# Patient Record
Sex: Female | Born: 1960
Health system: Southern US, Community
[De-identification: ages and names within clinical notes are randomized; demographics above are authoritative.]

## PROBLEM LIST (undated history)

## (undated) ENCOUNTER — Emergency Department (HOSPITAL_COMMUNITY): Admission: EM | Payer: Self-pay | Source: Home / Self Care

## (undated) DIAGNOSIS — R112 Nausea with vomiting, unspecified: Secondary | ICD-10-CM

## (undated) DIAGNOSIS — D649 Anemia, unspecified: Secondary | ICD-10-CM

## (undated) DIAGNOSIS — N186 End stage renal disease: Secondary | ICD-10-CM

## (undated) DIAGNOSIS — I10 Essential (primary) hypertension: Secondary | ICD-10-CM

## (undated) DIAGNOSIS — Z9289 Personal history of other medical treatment: Secondary | ICD-10-CM

## (undated) DIAGNOSIS — F32A Depression, unspecified: Secondary | ICD-10-CM

## (undated) DIAGNOSIS — H409 Unspecified glaucoma: Secondary | ICD-10-CM

## (undated) DIAGNOSIS — T8859XA Other complications of anesthesia, initial encounter: Secondary | ICD-10-CM

## (undated) DIAGNOSIS — H3500 Unspecified background retinopathy: Secondary | ICD-10-CM

## (undated) DIAGNOSIS — T4145XA Adverse effect of unspecified anesthetic, initial encounter: Secondary | ICD-10-CM

## (undated) DIAGNOSIS — E785 Hyperlipidemia, unspecified: Secondary | ICD-10-CM

## (undated) DIAGNOSIS — G629 Polyneuropathy, unspecified: Secondary | ICD-10-CM

## (undated) DIAGNOSIS — H269 Unspecified cataract: Secondary | ICD-10-CM

## (undated) DIAGNOSIS — R0600 Dyspnea, unspecified: Secondary | ICD-10-CM

## (undated) DIAGNOSIS — E1121 Type 2 diabetes mellitus with diabetic nephropathy: Secondary | ICD-10-CM

## (undated) DIAGNOSIS — Z973 Presence of spectacles and contact lenses: Secondary | ICD-10-CM

## (undated) DIAGNOSIS — F329 Major depressive disorder, single episode, unspecified: Secondary | ICD-10-CM

## (undated) HISTORY — PX: REFRACTIVE SURGERY: SHX103

## (undated) HISTORY — DX: Hyperlipidemia, unspecified: E78.5

## (undated) HISTORY — DX: Essential (primary) hypertension: I10

## (undated) HISTORY — DX: Major depressive disorder, single episode, unspecified: F32.9

## (undated) HISTORY — DX: Depression, unspecified: F32.A

## (undated) HISTORY — DX: Type 2 diabetes mellitus with diabetic nephropathy: E11.21

## (undated) HISTORY — PX: EYE SURGERY: SHX253

---
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
## 1984-01-07 DIAGNOSIS — E1139 Type 2 diabetes mellitus with other diabetic ophthalmic complication: Secondary | ICD-10-CM

## 1984-01-07 DIAGNOSIS — E1165 Type 2 diabetes mellitus with hyperglycemia: Secondary | ICD-10-CM

## 1992-01-07 HISTORY — PX: MYOMECTOMY: SHX85

## 1997-08-31 ENCOUNTER — Emergency Department (HOSPITAL_COMMUNITY): Admission: EM | Admit: 1997-08-31 | Discharge: 1997-08-31 | Payer: Self-pay | Admitting: Emergency Medicine

## 1997-11-22 ENCOUNTER — Ambulatory Visit (HOSPITAL_COMMUNITY): Admission: RE | Admit: 1997-11-22 | Discharge: 1997-11-22 | Payer: Self-pay | Admitting: Internal Medicine

## 1997-12-25 ENCOUNTER — Ambulatory Visit (HOSPITAL_COMMUNITY): Admission: RE | Admit: 1997-12-25 | Discharge: 1997-12-26 | Payer: Self-pay | Admitting: Ophthalmology

## 1998-01-29 ENCOUNTER — Encounter: Payer: Self-pay | Admitting: Ophthalmology

## 1998-01-29 ENCOUNTER — Ambulatory Visit (HOSPITAL_COMMUNITY): Admission: RE | Admit: 1998-01-29 | Discharge: 1998-01-29 | Payer: Self-pay | Admitting: Ophthalmology

## 1998-06-25 ENCOUNTER — Ambulatory Visit (HOSPITAL_COMMUNITY): Admission: RE | Admit: 1998-06-25 | Discharge: 1998-06-25 | Payer: Self-pay | Admitting: Ophthalmology

## 1998-07-17 ENCOUNTER — Emergency Department (HOSPITAL_COMMUNITY): Admission: EM | Admit: 1998-07-17 | Discharge: 1998-07-17 | Payer: Self-pay | Admitting: Emergency Medicine

## 1998-07-23 ENCOUNTER — Ambulatory Visit (HOSPITAL_COMMUNITY): Admission: RE | Admit: 1998-07-23 | Discharge: 1998-07-23 | Payer: Self-pay | Admitting: Ophthalmology

## 2002-01-06 HISTORY — PX: ABDOMINAL HYSTERECTOMY: SHX81

## 2002-06-30 ENCOUNTER — Encounter (INDEPENDENT_AMBULATORY_CARE_PROVIDER_SITE_OTHER): Payer: Self-pay | Admitting: Internal Medicine

## 2002-06-30 ENCOUNTER — Ambulatory Visit (HOSPITAL_COMMUNITY): Admission: RE | Admit: 2002-06-30 | Discharge: 2002-06-30 | Payer: Self-pay | Admitting: Obstetrics & Gynecology

## 2002-06-30 ENCOUNTER — Encounter: Payer: Self-pay | Admitting: Obstetrics & Gynecology

## 2002-08-12 ENCOUNTER — Encounter: Admission: RE | Admit: 2002-08-12 | Discharge: 2002-08-12 | Payer: Self-pay | Admitting: Internal Medicine

## 2002-08-18 ENCOUNTER — Inpatient Hospital Stay (HOSPITAL_COMMUNITY): Admission: AD | Admit: 2002-08-18 | Discharge: 2002-08-18 | Payer: Self-pay | Admitting: Obstetrics & Gynecology

## 2002-09-15 ENCOUNTER — Encounter: Admission: RE | Admit: 2002-09-15 | Discharge: 2002-09-15 | Payer: Self-pay | Admitting: Obstetrics and Gynecology

## 2002-11-08 ENCOUNTER — Encounter: Admission: RE | Admit: 2002-11-08 | Discharge: 2002-11-08 | Payer: Self-pay | Admitting: Obstetrics and Gynecology

## 2002-11-21 ENCOUNTER — Encounter: Admission: RE | Admit: 2002-11-21 | Discharge: 2002-11-21 | Payer: Self-pay | Admitting: Internal Medicine

## 2002-11-28 ENCOUNTER — Encounter: Admission: RE | Admit: 2002-11-28 | Discharge: 2002-11-28 | Payer: Self-pay | Admitting: Internal Medicine

## 2002-12-08 ENCOUNTER — Encounter: Admission: RE | Admit: 2002-12-08 | Discharge: 2002-12-08 | Payer: Self-pay | Admitting: Obstetrics and Gynecology

## 2002-12-19 ENCOUNTER — Encounter (INDEPENDENT_AMBULATORY_CARE_PROVIDER_SITE_OTHER): Payer: Self-pay | Admitting: *Deleted

## 2002-12-19 ENCOUNTER — Inpatient Hospital Stay (HOSPITAL_COMMUNITY): Admission: RE | Admit: 2002-12-19 | Discharge: 2002-12-22 | Payer: Self-pay | Admitting: Obstetrics and Gynecology

## 2002-12-26 ENCOUNTER — Inpatient Hospital Stay (HOSPITAL_COMMUNITY): Admission: AD | Admit: 2002-12-26 | Discharge: 2002-12-26 | Payer: Self-pay | Admitting: Obstetrics and Gynecology

## 2003-01-03 ENCOUNTER — Encounter: Admission: RE | Admit: 2003-01-03 | Discharge: 2003-01-03 | Payer: Self-pay | Admitting: Obstetrics and Gynecology

## 2003-01-11 ENCOUNTER — Encounter: Admission: RE | Admit: 2003-01-11 | Discharge: 2003-01-11 | Payer: Self-pay | Admitting: Internal Medicine

## 2003-01-18 ENCOUNTER — Encounter: Admission: RE | Admit: 2003-01-18 | Discharge: 2003-01-18 | Payer: Self-pay | Admitting: Internal Medicine

## 2003-02-02 ENCOUNTER — Encounter: Admission: RE | Admit: 2003-02-02 | Discharge: 2003-02-02 | Payer: Self-pay | Admitting: Obstetrics and Gynecology

## 2003-02-09 ENCOUNTER — Encounter: Admission: RE | Admit: 2003-02-09 | Discharge: 2003-02-09 | Payer: Self-pay | Admitting: Internal Medicine

## 2003-06-16 ENCOUNTER — Encounter: Admission: RE | Admit: 2003-06-16 | Discharge: 2003-06-16 | Payer: Self-pay | Admitting: Internal Medicine

## 2003-12-28 ENCOUNTER — Ambulatory Visit: Payer: Self-pay | Admitting: Internal Medicine

## 2004-01-11 ENCOUNTER — Ambulatory Visit: Payer: Self-pay | Admitting: Internal Medicine

## 2004-03-18 ENCOUNTER — Ambulatory Visit: Payer: Self-pay | Admitting: Internal Medicine

## 2004-03-27 ENCOUNTER — Ambulatory Visit: Payer: Self-pay | Admitting: Internal Medicine

## 2004-05-13 ENCOUNTER — Ambulatory Visit: Payer: Self-pay | Admitting: Internal Medicine

## 2005-05-20 ENCOUNTER — Ambulatory Visit: Payer: Self-pay | Admitting: Internal Medicine

## 2005-06-17 ENCOUNTER — Ambulatory Visit: Payer: Self-pay | Admitting: Internal Medicine

## 2005-06-20 ENCOUNTER — Ambulatory Visit: Payer: Self-pay | Admitting: Internal Medicine

## 2005-08-15 ENCOUNTER — Ambulatory Visit: Payer: Self-pay | Admitting: Gynecology

## 2005-12-23 DIAGNOSIS — I10 Essential (primary) hypertension: Secondary | ICD-10-CM

## 2005-12-23 DIAGNOSIS — E1169 Type 2 diabetes mellitus with other specified complication: Secondary | ICD-10-CM

## 2005-12-23 DIAGNOSIS — E1139 Type 2 diabetes mellitus with other diabetic ophthalmic complication: Secondary | ICD-10-CM

## 2005-12-23 DIAGNOSIS — F341 Dysthymic disorder: Secondary | ICD-10-CM

## 2005-12-23 DIAGNOSIS — E785 Hyperlipidemia, unspecified: Secondary | ICD-10-CM

## 2006-01-01 DIAGNOSIS — D509 Iron deficiency anemia, unspecified: Secondary | ICD-10-CM

## 2006-04-28 ENCOUNTER — Telehealth: Payer: Self-pay | Admitting: *Deleted

## 2006-04-29 ENCOUNTER — Encounter (INDEPENDENT_AMBULATORY_CARE_PROVIDER_SITE_OTHER): Payer: Self-pay | Admitting: Internal Medicine

## 2006-06-10 ENCOUNTER — Encounter (INDEPENDENT_AMBULATORY_CARE_PROVIDER_SITE_OTHER): Payer: Self-pay | Admitting: Internal Medicine

## 2006-06-10 ENCOUNTER — Ambulatory Visit: Payer: Self-pay | Admitting: Internal Medicine

## 2006-06-10 LAB — CONVERTED CEMR LAB
Bilirubin Urine: NEGATIVE
Bilirubin Urine: NEGATIVE
Creatinine, Urine: 127.6 mg/dL
Glucose, Urine, Semiquant: >=1000
Ketones, ur: NEGATIVE mg/dL
Ketones, urine, test strip: NEGATIVE
Microalb Creat Ratio: 1841.7 mg/g — ABNORMAL HIGH (ref 0.0–30.0)
Microalb, Ur: 235 mg/dL — ABNORMAL HIGH (ref 0.00–1.89)
Nitrite: POSITIVE
Nitrite: POSITIVE — AB
Protein, U semiquant: >=300
Protein, ur: >300 mg/dL — AB
Specific Gravity, Urine: >=1.03
Specific Gravity, Urine: 1.024 (ref 1.005–1.03)
Urine Glucose: 500 mg/dL — AB
Urobilinogen, UA: 0.2
Urobilinogen, UA: 0.2 (ref 0.0–1.0)
pH: 6
pH: 6.5 (ref 5.0–8.0)

## 2006-06-11 LAB — CONVERTED CEMR LAB
ALT: 25 units/L (ref 0–35)
AST: 16 units/L (ref 0–37)
Albumin: 3.9 g/dL (ref 3.5–5.2)
Alkaline Phosphatase: 95 units/L (ref 39–117)
BUN: 14 mg/dL (ref 6–23)
CO2: 24 meq/L (ref 19–32)
Calcium: 8.8 mg/dL (ref 8.4–10.5)
Chloride: 105 meq/L (ref 96–112)
Cholesterol: 254 mg/dL — ABNORMAL HIGH (ref 0–200)
Creatinine, Ser: 0.73 mg/dL (ref 0.40–1.20)
Glucose, Bld: 223 mg/dL — ABNORMAL HIGH (ref 70–99)
HCT: 42.7 % (ref 36.0–46.0)
HDL: 55 mg/dL (ref >39)
Hemoglobin: 14.6 g/dL (ref 12.0–15.0)
LDL Cholesterol: 186 mg/dL — ABNORMAL HIGH (ref 0–99)
MCHC: 34.2 g/dL (ref 30.0–36.0)
MCV: 81.6 fL (ref 78.0–100.0)
Platelets: 302 10*3/uL (ref 150–400)
Potassium: 4.3 meq/L (ref 3.5–5.3)
RBC: 5.23 M/uL — ABNORMAL HIGH (ref 3.87–5.11)
RDW: 13.4 % (ref 11.5–14.0)
Sodium: 139 meq/L (ref 135–145)
Total Bilirubin: 0.4 mg/dL (ref 0.3–1.2)
Total CHOL/HDL Ratio: 4.6
Total Protein: 6.2 g/dL (ref 6.0–8.3)
Triglycerides: 66 mg/dL (ref <150)
VLDL: 13 mg/dL (ref 0–40)
WBC: 4.4 10*3/uL (ref 4.0–10.5)

## 2006-06-19 ENCOUNTER — Telehealth: Payer: Self-pay | Admitting: *Deleted

## 2006-06-24 ENCOUNTER — Ambulatory Visit: Payer: Self-pay | Admitting: Internal Medicine

## 2006-06-24 LAB — CONVERTED CEMR LAB
Blood Glucose, Fingerstick: 172
Blood Glucose, Home Monitor: 2 mg/dL

## 2006-07-15 ENCOUNTER — Telehealth: Payer: Self-pay | Admitting: *Deleted

## 2006-08-12 ENCOUNTER — Telehealth (INDEPENDENT_AMBULATORY_CARE_PROVIDER_SITE_OTHER): Payer: Self-pay | Admitting: Pharmacy Technician

## 2006-08-20 ENCOUNTER — Ambulatory Visit: Payer: Self-pay | Admitting: Internal Medicine

## 2006-09-21 ENCOUNTER — Telehealth: Payer: Self-pay | Admitting: *Deleted

## 2006-09-23 ENCOUNTER — Encounter (INDEPENDENT_AMBULATORY_CARE_PROVIDER_SITE_OTHER): Payer: Self-pay | Admitting: Internal Medicine

## 2006-09-23 ENCOUNTER — Ambulatory Visit: Payer: Self-pay | Admitting: Internal Medicine

## 2006-09-23 DIAGNOSIS — E1129 Type 2 diabetes mellitus with other diabetic kidney complication: Secondary | ICD-10-CM

## 2006-09-23 LAB — CONVERTED CEMR LAB
BUN: 14 mg/dL (ref 6–23)
CO2: 24 meq/L (ref 19–32)
Calcium: 9.5 mg/dL (ref 8.4–10.5)
Chloride: 102 meq/L (ref 96–112)
Creatinine, Ser: 0.71 mg/dL (ref 0.40–1.20)
Glucose, Bld: 174 mg/dL — ABNORMAL HIGH (ref 70–99)
Potassium: 4.3 meq/L (ref 3.5–5.3)
Sodium: 137 meq/L (ref 135–145)

## 2006-10-08 ENCOUNTER — Telehealth (INDEPENDENT_AMBULATORY_CARE_PROVIDER_SITE_OTHER): Payer: Self-pay | Admitting: *Deleted

## 2006-10-20 ENCOUNTER — Telehealth: Payer: Self-pay | Admitting: *Deleted

## 2006-11-06 ENCOUNTER — Telehealth (INDEPENDENT_AMBULATORY_CARE_PROVIDER_SITE_OTHER): Payer: Self-pay | Admitting: Internal Medicine

## 2006-11-17 ENCOUNTER — Encounter (INDEPENDENT_AMBULATORY_CARE_PROVIDER_SITE_OTHER): Payer: Self-pay | Admitting: Internal Medicine

## 2006-11-17 ENCOUNTER — Ambulatory Visit (HOSPITAL_COMMUNITY): Admission: RE | Admit: 2006-11-17 | Discharge: 2006-11-17 | Payer: Self-pay | Admitting: Family Medicine

## 2006-11-24 ENCOUNTER — Encounter (INDEPENDENT_AMBULATORY_CARE_PROVIDER_SITE_OTHER): Payer: Self-pay | Admitting: Internal Medicine

## 2006-12-23 ENCOUNTER — Telehealth: Payer: Self-pay | Admitting: *Deleted

## 2007-01-18 ENCOUNTER — Telehealth (INDEPENDENT_AMBULATORY_CARE_PROVIDER_SITE_OTHER): Payer: Self-pay | Admitting: Internal Medicine

## 2007-01-22 ENCOUNTER — Telehealth (INDEPENDENT_AMBULATORY_CARE_PROVIDER_SITE_OTHER): Payer: Self-pay | Admitting: Internal Medicine

## 2007-01-28 ENCOUNTER — Telehealth: Payer: Self-pay | Admitting: *Deleted

## 2007-02-01 ENCOUNTER — Ambulatory Visit: Payer: Self-pay | Admitting: Internal Medicine

## 2007-02-01 LAB — CONVERTED CEMR LAB
Blood Glucose, Fingerstick: 263
Hgb A1c MFr Bld: 9.7 %

## 2007-02-05 ENCOUNTER — Ambulatory Visit: Payer: Self-pay | Admitting: Infectious Disease

## 2007-02-05 ENCOUNTER — Encounter (INDEPENDENT_AMBULATORY_CARE_PROVIDER_SITE_OTHER): Payer: Self-pay | Admitting: Internal Medicine

## 2007-02-06 LAB — CONVERTED CEMR LAB
BUN: 17 mg/dL (ref 6–23)
CO2: 25 meq/L (ref 19–32)
Calcium: 9.4 mg/dL (ref 8.4–10.5)
Chloride: 104 meq/L (ref 96–112)
Cholesterol: 136 mg/dL (ref 0–200)
Creatinine, Ser: 0.75 mg/dL (ref 0.40–1.20)
Glucose, Bld: 218 mg/dL — ABNORMAL HIGH (ref 70–99)
HDL: 41 mg/dL (ref >39)
LDL Cholesterol: 86 mg/dL (ref 0–99)
Potassium: 4.6 meq/L (ref 3.5–5.3)
Sodium: 141 meq/L (ref 135–145)
Total CHOL/HDL Ratio: 3.3
Triglycerides: 44 mg/dL (ref <150)
VLDL: 9 mg/dL (ref 0–40)

## 2007-02-16 ENCOUNTER — Telehealth (INDEPENDENT_AMBULATORY_CARE_PROVIDER_SITE_OTHER): Payer: Self-pay | Admitting: Internal Medicine

## 2007-03-03 ENCOUNTER — Ambulatory Visit: Payer: Self-pay | Admitting: Cardiology

## 2007-03-03 ENCOUNTER — Encounter: Payer: Self-pay | Admitting: Internal Medicine

## 2007-03-03 ENCOUNTER — Ambulatory Visit (HOSPITAL_COMMUNITY): Admission: RE | Admit: 2007-03-03 | Discharge: 2007-03-03 | Payer: Self-pay | Admitting: Internal Medicine

## 2007-04-07 ENCOUNTER — Telehealth (INDEPENDENT_AMBULATORY_CARE_PROVIDER_SITE_OTHER): Payer: Self-pay | Admitting: *Deleted

## 2007-07-07 ENCOUNTER — Ambulatory Visit: Payer: Self-pay | Admitting: *Deleted

## 2007-07-07 LAB — CONVERTED CEMR LAB: Hgb A1c MFr Bld: 8.9 %

## 2007-07-08 ENCOUNTER — Telehealth: Payer: Self-pay | Admitting: Licensed Clinical Social Worker

## 2007-07-26 ENCOUNTER — Telehealth: Payer: Self-pay | Admitting: Internal Medicine

## 2007-07-27 ENCOUNTER — Encounter: Payer: Self-pay | Admitting: Licensed Clinical Social Worker

## 2007-08-04 ENCOUNTER — Ambulatory Visit: Payer: Self-pay | Admitting: Internal Medicine

## 2007-08-04 ENCOUNTER — Encounter: Payer: Self-pay | Admitting: Internal Medicine

## 2007-08-04 LAB — CONVERTED CEMR LAB
ALT: 26 units/L (ref 0–35)
AST: 13 units/L (ref 0–37)
Albumin: 4.4 g/dL (ref 3.5–5.2)
Alkaline Phosphatase: 87 units/L (ref 39–117)
BUN: 14 mg/dL (ref 6–23)
Blood Glucose, Fingerstick: 216
CO2: 20 meq/L (ref 19–32)
Calcium: 9.3 mg/dL (ref 8.4–10.5)
Chloride: 104 meq/L (ref 96–112)
Creatinine, Ser: 0.68 mg/dL (ref 0.40–1.20)
Creatinine, Urine: 40.6 mg/dL
Glucose, Bld: 191 mg/dL — ABNORMAL HIGH (ref 70–99)
Microalb Creat Ratio: 428.5 mg/g — ABNORMAL HIGH (ref 0.0–30.0)
Microalb, Ur: 17.4 mg/dL — ABNORMAL HIGH (ref 0.00–1.89)
Potassium: 4.2 meq/L (ref 3.5–5.3)
Sodium: 138 meq/L (ref 135–145)
Total Bilirubin: 0.4 mg/dL (ref 0.3–1.2)
Total Protein: 6.6 g/dL (ref 6.0–8.3)

## 2007-08-10 ENCOUNTER — Encounter: Payer: Self-pay | Admitting: Licensed Clinical Social Worker

## 2007-09-22 ENCOUNTER — Telehealth: Payer: Self-pay | Admitting: Internal Medicine

## 2007-10-05 ENCOUNTER — Telehealth (INDEPENDENT_AMBULATORY_CARE_PROVIDER_SITE_OTHER): Payer: Self-pay | Admitting: *Deleted

## 2007-11-15 ENCOUNTER — Telehealth: Payer: Self-pay | Admitting: Internal Medicine

## 2007-12-20 ENCOUNTER — Telehealth: Payer: Self-pay | Admitting: *Deleted

## 2007-12-20 ENCOUNTER — Encounter: Payer: Self-pay | Admitting: Licensed Clinical Social Worker

## 2007-12-27 ENCOUNTER — Ambulatory Visit: Payer: Self-pay | Admitting: Internal Medicine

## 2007-12-27 LAB — CONVERTED CEMR LAB
Blood Glucose, Fingerstick: 192
Hgb A1c MFr Bld: 9.4 %

## 2008-01-03 ENCOUNTER — Telehealth: Payer: Self-pay | Admitting: *Deleted

## 2008-01-13 ENCOUNTER — Telehealth: Payer: Self-pay | Admitting: Internal Medicine

## 2008-02-24 ENCOUNTER — Telehealth: Payer: Self-pay | Admitting: *Deleted

## 2008-02-25 ENCOUNTER — Telehealth: Payer: Self-pay | Admitting: *Deleted

## 2008-03-08 ENCOUNTER — Encounter: Payer: Self-pay | Admitting: Internal Medicine

## 2008-03-08 ENCOUNTER — Encounter: Payer: Self-pay | Admitting: Licensed Clinical Social Worker

## 2008-03-08 ENCOUNTER — Encounter (INDEPENDENT_AMBULATORY_CARE_PROVIDER_SITE_OTHER): Payer: Self-pay | Admitting: *Deleted

## 2008-03-08 ENCOUNTER — Ambulatory Visit: Payer: Self-pay | Admitting: *Deleted

## 2008-03-08 LAB — CONVERTED CEMR LAB
ALT: 19 units/L (ref 0–35)
AST: 14 units/L (ref 0–37)
Albumin: 4.2 g/dL (ref 3.5–5.2)
Alkaline Phosphatase: 75 units/L (ref 39–117)
BUN: 11 mg/dL (ref 6–23)
Blood Glucose, Fingerstick: 203
CO2: 23 meq/L (ref 19–32)
Calcium: 8.9 mg/dL (ref 8.4–10.5)
Chloride: 105 meq/L (ref 96–112)
Creatinine, Ser: 0.63 mg/dL (ref 0.40–1.20)
Glucose, Bld: 141 mg/dL — ABNORMAL HIGH (ref 70–99)
HCT: 41.9 % (ref 36.0–46.0)
Hemoglobin: 14.3 g/dL (ref 12.0–15.0)
Hgb A1c MFr Bld: 9.4 %
MCHC: 34.1 g/dL (ref 30.0–36.0)
MCV: 80.1 fL (ref 78.0–100.0)
Platelets: 337 10*3/uL (ref 150–400)
Potassium: 3.9 meq/L (ref 3.5–5.3)
RBC: 5.23 M/uL — ABNORMAL HIGH (ref 3.87–5.11)
RDW: 13.6 % (ref 11.5–15.5)
Sodium: 140 meq/L (ref 135–145)
TSH: 0.661 microintl units/mL (ref 0.350–4.50)
Total Bilirubin: 0.4 mg/dL (ref 0.3–1.2)
Total Protein: 6.5 g/dL (ref 6.0–8.3)
WBC: 5.9 10*3/uL (ref 4.0–10.5)

## 2008-03-20 ENCOUNTER — Telehealth (INDEPENDENT_AMBULATORY_CARE_PROVIDER_SITE_OTHER): Payer: Self-pay | Admitting: Internal Medicine

## 2008-03-24 ENCOUNTER — Telehealth: Payer: Self-pay | Admitting: Licensed Clinical Social Worker

## 2008-03-27 ENCOUNTER — Ambulatory Visit: Payer: Self-pay | Admitting: Internal Medicine

## 2008-03-27 LAB — CONVERTED CEMR LAB: Blood Glucose, Fingerstick: 155

## 2008-04-06 ENCOUNTER — Telehealth: Payer: Self-pay | Admitting: *Deleted

## 2008-04-19 ENCOUNTER — Telehealth: Payer: Self-pay | Admitting: Internal Medicine

## 2008-05-01 ENCOUNTER — Telehealth: Payer: Self-pay | Admitting: Internal Medicine

## 2008-05-31 ENCOUNTER — Telehealth: Payer: Self-pay | Admitting: Internal Medicine

## 2008-06-01 ENCOUNTER — Ambulatory Visit: Payer: Self-pay | Admitting: Internal Medicine

## 2008-06-01 LAB — CONVERTED CEMR LAB: Blood Glucose, Fingerstick: 101

## 2008-06-06 ENCOUNTER — Encounter: Payer: Self-pay | Admitting: Internal Medicine

## 2008-07-14 ENCOUNTER — Telehealth: Payer: Self-pay | Admitting: Internal Medicine

## 2008-08-04 ENCOUNTER — Telehealth: Payer: Self-pay | Admitting: Internal Medicine

## 2008-09-28 ENCOUNTER — Telehealth: Payer: Self-pay | Admitting: Internal Medicine

## 2008-10-12 ENCOUNTER — Telehealth: Payer: Self-pay | Admitting: Internal Medicine

## 2008-10-16 ENCOUNTER — Telehealth: Payer: Self-pay | Admitting: Internal Medicine

## 2008-10-19 ENCOUNTER — Telehealth: Payer: Self-pay | Admitting: Internal Medicine

## 2008-10-31 ENCOUNTER — Telehealth: Payer: Self-pay | Admitting: Internal Medicine

## 2008-11-02 ENCOUNTER — Telehealth: Payer: Self-pay | Admitting: Internal Medicine

## 2009-01-12 ENCOUNTER — Telehealth: Payer: Self-pay | Admitting: Internal Medicine

## 2009-02-07 ENCOUNTER — Telehealth: Payer: Self-pay | Admitting: Internal Medicine

## 2009-02-28 ENCOUNTER — Telehealth: Payer: Self-pay | Admitting: Internal Medicine

## 2009-04-03 ENCOUNTER — Encounter: Payer: Self-pay | Admitting: *Deleted

## 2009-04-19 ENCOUNTER — Telehealth: Payer: Self-pay | Admitting: Internal Medicine

## 2009-04-30 ENCOUNTER — Telehealth: Payer: Self-pay | Admitting: Internal Medicine

## 2009-05-30 ENCOUNTER — Telehealth: Payer: Self-pay | Admitting: Internal Medicine

## 2009-06-18 ENCOUNTER — Ambulatory Visit: Payer: Self-pay | Admitting: Internal Medicine

## 2009-06-18 ENCOUNTER — Encounter: Payer: Self-pay | Admitting: Internal Medicine

## 2009-06-18 LAB — CONVERTED CEMR LAB
ALT: 19 units/L (ref 0–35)
AST: 11 units/L (ref 0–37)
Albumin: 4.1 g/dL (ref 3.5–5.2)
Alkaline Phosphatase: 82 units/L (ref 39–117)
BUN: 14 mg/dL (ref 6–23)
Blood Glucose, AC Bkfst: 242 mg/dL
CO2: 22 meq/L (ref 19–32)
Calcium: 9.6 mg/dL (ref 8.4–10.5)
Chloride: 103 meq/L (ref 96–112)
Cholesterol: 167 mg/dL (ref 0–200)
Creatinine, Ser: 0.67 mg/dL (ref 0.40–1.20)
Glucose, Bld: 251 mg/dL — ABNORMAL HIGH (ref 70–99)
HDL: 41 mg/dL (ref >39)
Hgb A1c MFr Bld: 9.3 %
LDL Cholesterol: 112 mg/dL — ABNORMAL HIGH (ref 0–99)
Potassium: 3.6 meq/L (ref 3.5–5.3)
Sodium: 139 meq/L (ref 135–145)
Total Bilirubin: 0.4 mg/dL (ref 0.3–1.2)
Total CHOL/HDL Ratio: 4.1
Total Protein: 6.1 g/dL (ref 6.0–8.3)
Triglycerides: 70 mg/dL (ref <150)
VLDL: 14 mg/dL (ref 0–40)

## 2009-08-08 ENCOUNTER — Telehealth: Payer: Self-pay | Admitting: Internal Medicine

## 2009-08-17 ENCOUNTER — Telehealth: Payer: Self-pay | Admitting: Internal Medicine

## 2009-08-23 ENCOUNTER — Telehealth: Payer: Self-pay | Admitting: Internal Medicine

## 2009-09-18 ENCOUNTER — Telehealth: Payer: Self-pay | Admitting: Internal Medicine

## 2009-10-24 ENCOUNTER — Telehealth: Payer: Self-pay | Admitting: Internal Medicine

## 2010-01-01 ENCOUNTER — Ambulatory Visit: Payer: Self-pay | Admitting: Internal Medicine

## 2010-01-01 ENCOUNTER — Ambulatory Visit
Admission: RE | Admit: 2010-01-01 | Discharge: 2010-01-01 | Payer: Self-pay | Source: Home / Self Care | Attending: Internal Medicine | Admitting: Internal Medicine

## 2010-01-01 LAB — CONVERTED CEMR LAB
Blood Glucose, Fingerstick: 194
Hgb A1c MFr Bld: 10.5 %

## 2010-01-03 ENCOUNTER — Encounter: Payer: Self-pay | Admitting: Internal Medicine

## 2010-01-10 ENCOUNTER — Telehealth: Payer: Self-pay | Admitting: Internal Medicine

## 2010-01-15 ENCOUNTER — Encounter: Payer: Self-pay | Admitting: Internal Medicine

## 2010-01-16 ENCOUNTER — Ambulatory Visit: Admission: RE | Admit: 2010-01-16 | Discharge: 2010-01-16 | Payer: Self-pay | Source: Home / Self Care

## 2010-01-16 LAB — CONVERTED CEMR LAB: Blood Glucose, Fingerstick: 219

## 2010-01-16 LAB — HM DIABETES FOOT EXAM: HM Diabetic Foot Exam: NEGATIVE

## 2010-01-21 LAB — GLUCOSE, CAPILLARY: Glucose-Capillary: 219 mg/dL — ABNORMAL HIGH (ref 70–99)

## 2010-01-23 ENCOUNTER — Ambulatory Visit (HOSPITAL_COMMUNITY)
Admission: RE | Admit: 2010-01-23 | Discharge: 2010-01-23 | Payer: Self-pay | Source: Home / Self Care | Attending: Internal Medicine | Admitting: Internal Medicine

## 2010-01-23 LAB — HM MAMMOGRAPHY: HM Mammogram: NEGATIVE

## 2010-01-25 LAB — CONVERTED CEMR LAB
Creatinine, Urine: 79.1 mg/dL
Microalb Creat Ratio: 1329.2 mg/g — ABNORMAL HIGH (ref 0.0–30.0)
Microalb, Ur: 105.14 mg/dL — ABNORMAL HIGH (ref 0.00–1.89)

## 2010-02-05 NOTE — Progress Notes (Signed)
Summary: refill/ hla  Phone Note Refill Request Message from:  Fax from Pharmacy on April 19, 2009 4:02 PM  Refills Requested: Medication #1:  LANTUS 100 UNIT/ML SOLN Inject 25 units under the skin in the morning time   Last Refilled: 1/11 Initial call taken by: Freddy Finner RN,  April 19, 2009 4:02 PM  Follow-up for Phone Call        completed refill, thank you Iskra  Follow-up by: Trinidad Curet MD,  April 19, 2009 4:31 PM    Prescriptions: LANTUS 100 UNIT/ML SOLN (INSULIN GLARGINE) Inject 25 units under the skin in the morning time, every morning  #1 vial x 3   Entered and Authorized by:   Trinidad Curet MD   Signed by:   Trinidad Curet MD on 04/19/2009   Method used:   Telephoned to ...       [facility] Department (retail)       [address] [place], [place]  [address]       Ph: [phone]       Fax: [phone]   RxID:   [id number]

## 2010-02-05 NOTE — Progress Notes (Signed)
Summary: refill/gg  Phone Note Refill Request  on February 28, 2009 4:48 PM  Refills Requested: Medication #1:  ACCUPRIL 40 MG TABS Take 1 tablet by mouth once a day   Last Refilled: 10/18/2008  Medication #2:  NORVASC 10 MG TABS Take 1 tablet by mouth once a day   Last Refilled: 10/18/2008 last labs 03/08/08   Method Requested: Fax to [place] Initial call taken by: Gevena Cotton RN,  February 28, 2009 4:48 PM  Follow-up for Phone Call        completed Follow-up by: Trinidad Curet MD,  March 03, 2009 11:00 AM    Prescriptions: ACCUPRIL 40 MG TABS (QUINAPRIL HCL) Take 1 tablet by mouth once a day  #31 x 6   Entered and Authorized by:   Trinidad Curet MD   Signed by:   Trinidad Curet MD on 03/03/2009   Method used:   Faxed to ...       [facility] (retail)       [place], [place]  [address]       Ph: [phone]       Fax: [phone]   RxID:   [id number] NORVASC 10 MG TABS (AMLODIPINE BESYLATE) Take 1 tablet by mouth once a day  #30 x 6   Entered and Authorized by:   Trinidad Curet MD   Signed by:   Trinidad Curet MD on 03/03/2009   Method used:   Faxed to ...       [facility] Department (retail)       [address] [place], [place]  [address]       Ph: [phone]       Fax: [phone]   RxID:   [id number]

## 2010-02-05 NOTE — Progress Notes (Signed)
Summary: refill/ hla  Phone Note Refill Request Message from:  Fax from Pharmacy on February 07, 2009 11:54 AM  Refills Requested: Medication #1:  NOVOLOG 100 UNIT/ML SOLN Inject 5 units under the skin 15 minutes befor each meal and make sure you check your sugar levels.   Last Refilled: 10/12 completed  Initial call taken by: Freddy Finner RN,  February 07, 2009 11:54 AM    Prescriptions: NOVOLOG 100 UNIT/ML SOLN (INSULIN ASPART) Inject 5 units under the skin 15 minutes befor each meal and make sure you check your sugar levels  #1 vial x 3   Entered and Authorized by:   Trinidad Curet MD   Signed by:   Trinidad Curet MD on 02/07/2009   Method used:   Faxed to ...       [facility] (retail)       [place], [place]  [address]       Ph: [phone]       Fax: [phone]   RxID:   [id number] NOVOLOG 100 UNIT/ML SOLN (INSULIN ASPART) Inject 5 units under the skin 15 minutes befor each meal and make sure you check your sugar levels  #1 vial x 3   Entered and Authorized by:   Trinidad Curet MD   Signed by:   Trinidad Curet MD on 02/07/2009   Method used:   Telephoned to ...       [facility] Department (retail)       [address] [place], [place]  [address]       Ph: [phone]       Fax: [phone]   RxID:   [id number]

## 2010-02-05 NOTE — Progress Notes (Signed)
Summary: med refill/gp  Phone Note Refill Request Message from:  Fax from Pharmacy on August 23, 2009 11:55 AM  Refills Requested: Medication #1:  AMBIEN 5 MG TABS Take 1 tab by mouth at bedtime   Last Refilled: 06/18/2009 Last appt. June 13.   Method Requested: Telephone to Pharmacy Initial call taken by: Morrison Old RN,  August 23, 2009 11:55 AM  Follow-up for Phone Call        completed refill, thank you Iskra  Follow-up by: Trinidad Curet MD,  August 23, 2009 12:42 PM    Prescriptions: AMBIEN 5 MG TABS (ZOLPIDEM TARTRATE) Take 1 tab by mouth at bedtime  #30 x 5   Entered and Authorized by:   Trinidad Curet MD   Signed by:   Trinidad Curet MD on 08/23/2009   Method used:   Telephoned to ...       [facility] (retail)       [address]       [place], [place]  [address]       Ph: [phone]       Fax: [phone]   RxID:   [id number]   Appended Document: med refill/gp Ambien Rx called to [facility].

## 2010-02-05 NOTE — Progress Notes (Signed)
Summary: med refill/gp  Phone Note Refill Request Message from:  Fax from Pharmacy on August 17, 2009 3:11 PM  Refills Requested: Medication #1:  GLUCOTROL 10 MG TABS Take 1 tablet by mouth two times a day   Last Refilled: 05/18/2009 Last appt.  06/18/09.   Method Requested: Telephone to Pharmacy Initial call taken by: Morrison Old RN,  August 17, 2009 3:11 PM  Follow-up for Phone Call        completed refill, thank you Iskra  Follow-up by: Trinidad Curet MD,  August 18, 2009 10:26 AM    Prescriptions: GLUCOTROL 10 MG TABS (GLIPIZIDE) Take 1 tablet by mouth two times a day  #200 x 11   Entered and Authorized by:   Trinidad Curet MD   Signed by:   Trinidad Curet MD on 08/18/2009   Method used:   Faxed to ...       [facility] Department (retail)       [address] [place], [place]  [address]       Ph: [phone]       Fax: [phone]   RxID:   [id number]

## 2010-02-05 NOTE — Progress Notes (Signed)
Summary: med refill/gp  Phone Note Refill Request Message from:  Fax from Pharmacy on August 08, 2009 3:03 PM  Refills Requested: Medication #1:  NORVASC 10 MG TABS Take 1 tablet by mouth once a day   Last Refilled: 05/18/2009 [place] t appt. June 13.   Method Requested: Telephone to Pharmacy Initial call taken by: Morrison Old RN,  August 08, 2009 3:03 PM  Follow-up for Phone Call        completed refill, thank you Aleia Larocca  Follow-up by: Trinidad Curet MD,  August 08, 2009 3:47 PM    Prescriptions: NORVASC 10 MG TABS (AMLODIPINE BESYLATE) Take 1 tablet by mouth once a day  #30 x 7   Entered and Authorized by:   Trinidad Curet MD   Signed by:   Trinidad Curet MD on 08/08/2009   Method used:   Faxed to ...       [facility] Department (retail)       [address] [place], [place]  [address]       Ph: [phone]       Fax: [phone]   RxID:   [id number]

## 2010-02-05 NOTE — Progress Notes (Signed)
Summary: refill/ hla  Phone Note Refill Request Message from:  Fax from Pharmacy on May 30, 2009 11:24 AM  Refills Requested: Medication #1:  PRAVASTATIN SODIUM 40 MG  TABS Take 1 tablet by mouth at bedtime   Last Refilled: 4/22 Initial call taken by: Freddy Finner RN,  May 30, 2009 11:24 AM  Follow-up for Phone Call        completed refill, thank you Mujahid Jalomo  Follow-up by: Trinidad Curet MD,  May 30, 2009 1:32 PM    Prescriptions: PRAVASTATIN SODIUM 40 MG  TABS (PRAVASTATIN SODIUM) Take 1 tablet by mouth at bedtime  #30 x 3   Entered and Authorized by:   Trinidad Curet MD   Signed by:   Trinidad Curet MD on 05/30/2009   Method used:   Electronically to        [facility] (retail)       [address]       [place], [place]  [address]       Ph: [phone]       Fax: [phone]   RxID:   [id number]

## 2010-02-05 NOTE — Progress Notes (Signed)
Summary: refill/ hla  Phone Note Refill Request Message from:  Fax from Pharmacy on September 18, 2009 9:59 AM  Refills Requested: Medication #1:  ACCUPRIL 40 MG TABS Take 1 tablet by mouth once a day   Dosage confirmed as above?Dosage Confirmed   Last Refilled: 8/11 last visit and labs 8/13  Initial call taken by: Freddy Finner RN,  September 18, 2009 10:00 AM  Follow-up for Phone Call        completed refill, thank you Iskra  Follow-up by: Trinidad Curet MD,  September 21, 2009 9:26 PM    Prescriptions: ACCUPRIL 40 MG TABS (QUINAPRIL HCL) Take 1 tablet by mouth once a day  #31 x 6   Entered and Authorized by:   Trinidad Curet MD   Signed by:   Trinidad Curet MD on 09/21/2009   Method used:   Faxed to ...       [facility] (retail)       [place], [place]  [address]       Ph: [phone]       Fax: [phone]   RxID:   [id number] ACCUPRIL 40 MG TABS (QUINAPRIL HCL) Take 1 tablet by mouth once a day  #31 x 6   Entered and Authorized by:   Trinidad Curet MD   Signed by:   Trinidad Curet MD on 09/21/2009   Method used:   Telephoned to ...       [facility] Department (retail)       [address] [place], [place]  [address]       Ph: [phone]       Fax: [phone]   RxID:   [id number]

## 2010-02-05 NOTE — Progress Notes (Signed)
Summary: refill/gg  Phone Note Refill Request  on April 30, 2009 4:20 PM  Refills Requested: Medication #1:  METFORMIN HCL 1000 MG  TABS Take 1 tablet by mouth two times a day   Last Refilled: 03/14/2009  Method Requested: Electronic Initial call taken by: Gevena Cotton RN,  April 30, 2009 4:20 PM  Follow-up for Phone Call        completed refill, thank you Ann Bryan  Follow-up by: Trinidad Curet MD,  April 30, 2009 4:35 PM    Prescriptions: METFORMIN HCL 1000 MG  TABS (METFORMIN HCL) Take 1 tablet by mouth two times a day  #60 x 3   Entered and Authorized by:   Trinidad Curet MD   Signed by:   Trinidad Curet MD on 04/30/2009   Method used:   Electronically to        [facility] (retail)       [address]       [place], [place]  [address]       Ph: [phone]       Fax: [phone]   RxID:   [id number]

## 2010-02-05 NOTE — Letter (Signed)
Summary: Recall Letter  [facility]  [address]   [place], [place] [address]   Phone: [phone]  Fax: [phone]    04/03/2009   AMADI IGO [address] [place], [place]  [address]   Dear  Ms. Kalayah Drummond,  To help [place] provide the highest quality medical care, [facility] uses a sophisticated computer system to track our patient records.  During a review of your record, it appears that you are due for a return visit soon.   Please call our office at your earliest convenience so that we may schedule an appointment. If you are getting your healthcare somewhere else, please call us and we will take you off our list.  Healthy Regards.    Mateo Flow [facility])    Appended Document: Recall Letter Unable to reach pt by phone, above letter mailed.

## 2010-02-05 NOTE — Progress Notes (Signed)
Summary: med refill/gp  Phone Note Refill Request Message from:  Fax from Pharmacy on January 12, 2009 10:30 AM  Refills Requested: Medication #1:  LANTUS 100 UNIT/ML SOLN Inject 25 units under the skin in the morning time   Last Refilled: 11/03/2008  Method Requested: Fax to [facility] Initial call taken by: Morrison Old RN,  January 12, 2009 10:30 AM  Follow-up for Phone Call        Please schedule [name] into the next available non-overbook appointment with Dr. Donnella Bi. Follow-up by: Oval Linsey MD,  January 12, 2009 10:55 AM  Additional Follow-up for Phone Call Additional follow up Details #1::        Flag sent to [facility] for an appt. Additional Follow-up by: Morrison Old RN,  January 12, 2009 4:10 PM    Prescriptions: LANTUS 100 UNIT/ML SOLN (INSULIN GLARGINE) Inject 25 units under the skin in the morning time, every morning  #1 vial x 3   Entered and Authorized by:   Oval Linsey MD   Signed by:   Oval Linsey MD on 01/12/2009   Method used:   Faxed to ...       [facility] Department (retail)       [address] [place], [place]  [address]       Ph: [phone]       Fax: [phone]   RxID:   [id number]

## 2010-02-05 NOTE — Progress Notes (Signed)
Summary: med refill/gp  Phone Note Refill Request Message from:  Fax from Pharmacy on October 24, 2009 4:08 PM  Refills Requested: Medication #1:  METFORMIN HCL 1000 MG  TABS Take 1 tablet by mouth two times a day   Last Refilled: 09/11/2009  Medication #2:  HYDROCHLOROTHIAZIDE 25 MG  TABS Take 1 tablet by mouth once a day   Last Refilled: 09/11/2009 Last appt. 06/18/09 w/labs.   Method Requested: Electronic Initial call taken by: Morrison Old RN,  October 24, 2009 4:09 PM  Follow-up for Phone Call        completed refill, thank you Javonna Balli  Follow-up by: Trinidad Curet MD,  October 24, 2009 10:19 PM    Prescriptions: HYDROCHLOROTHIAZIDE 25 MG  TABS (HYDROCHLOROTHIAZIDE) Take 1 tablet by mouth once a day  #31 x 4   Entered and Authorized by:   Trinidad Curet MD   Signed by:   Trinidad Curet MD on 10/24/2009   Method used:   Electronically to        [facility] (retail)       [address]       [place], [place]  [address]       Ph: [phone]       Fax: [phone]   RxID:   [id number] PRAVASTATIN SODIUM 40 MG  TABS (PRAVASTATIN SODIUM) Take 1 tablet by mouth at bedtime  #30 x 3   Entered and Authorized by:   Trinidad Curet MD   Signed by:   Trinidad Curet MD on 10/24/2009   Method used:   Electronically to        [facility] (retail)       [address]       [place], [place]  [address]       Ph: [phone]       Fax: [phone]   [id number] METFORMIN HCL 1000 MG  TABS (METFORMIN HCL) Take 1 tablet by mouth two times a day  #60 x 3   Entered and Authorized by:   Trinidad Curet MD   Signed by:   Trinidad Curet MD on 10/24/2009   Method used:   Electronically to        [facility] (retail)       [address]       [place], [place]  [address]       Ph: [phone]       Fax: [phone]   [id number] GLUCOTROL 10 MG TABS (GLIPIZIDE) Take 1 tablet by mouth two times a day  #200 x 11   Entered and Authorized by:   Trinidad Curet  MD   Signed by:   Trinidad Curet MD on 10/24/2009   Method used:   Electronically to        [facility] (retail)       [address]       [place], [place]  [address]       Ph: [phone]       Fax: [phone]   RxID:   [id number]

## 2010-02-05 NOTE — Assessment & Plan Note (Signed)
Summary: est-ck/fu/meds/cfb   Vital Signs:  Patient profile:   50 year old female Height:      61.5 inches (156.21 cm) Weight:      176.8 pounds (80.36 kg) BMI:     32.98 Temp:     97.7 degrees F oral Pulse rate:   68 / minute BP sitting:   132 / 72  (right arm)  Vitals Entered By: Morrison Old RN (June 18, 2009 9:34 AM) CC: Check-up.  Difficulty sleeping. Is Patient Diabetic? Yes Did you bring your meter with you today? Yes Pain Assessment Patient in pain? no       Have you ever been in a relationship where you felt threatened, hurt or afraid?No   Does patient need assistance? Functional Status Self care Ambulation Normal   Primary Care Provider:  Domingo Mend MD  CC:  Check-up.  Difficulty sleeping.Marland Kitchen  History of Present Illness: 50 yo female with PMH outlined below presents to [facility] for regular follow up appointment. She has no concerns at the time. No recent sicknesses or hospitalizaitons. No episodes of chest pain, SOB, palpitations. No specific abdominal or urinary concerns. No recent changes in appetite, weight, sleep patterns, mood.    Depression History:      The patient denies a depressed mood most of the day and a diminished interest in her usual daily activities.  The patient denies significant weight loss, significant weight gain, insomnia, hypersomnia, psychomotor agitation, psychomotor retardation, fatigue (loss of energy), feelings of worthlessness (guilt), impaired concentration (indecisiveness), and recurrent thoughts of death or suicide.        The patient denies that she feels like life is not worth living, denies that she wishes that she were dead, and denies that she has thought about ending her life.         Preventive Screening-Counseling & Management  Alcohol-Tobacco     Alcohol drinks/day: 0     Smoking Status: never     Passive Smoke Exposure: yes  Caffeine-Diet-Exercise     Does Patient Exercise: yes     Type of exercise:  WALKING-occasionally     Times/week: 1  Problems Prior to Update: 1)  Middle Ear Infection  (A999333) 2)  Systolic Murmur  ([id number]) 3)  Nephropathy, Diabetic  (ICD-250.40) 4)  Uti  (ICD-599.0) 5)  Health Maintenance Exam  (ICD-V70.0) 6)  Obesity Nos  (ICD-278.00) 7)  Anemia-iron Deficiency  (ICD-280.9) 8)  Diabetic Retinopathy  (ICD-250.50) 9)  Hx of Hysterectomy, Hx of  (ICD-V45.77) 10)  Hypertension  (ICD-401.9) 11)  Hyperlipidemia  (ICD-272.4) 12)  Diabetes Mellitus, Type II  (ICD-250.00) 13)  Depression  (ICD-311)  Medications Prior to Update: 1)  Norvasc 10 Mg Tabs (Amlodipine Besylate) .... Take 1 Tablet By Mouth Once A Day 2)  Glucotrol 10 Mg Tabs (Glipizide) .... Take 1 Tablet By Mouth Two Times A Day 3)  Metformin Hcl 1000 Mg  Tabs (Metformin Hcl) .... Take 1 Tablet By Mouth Two Times A Day 4)  Pravastatin Sodium 40 Mg  Tabs (Pravastatin Sodium) .... Take 1 Tablet By Mouth At Bedtime 5)  Hydrochlorothiazide 25 Mg  Tabs (Hydrochlorothiazide) .... Take 1 Tablet By Mouth Once A Day 6)  Accupril 40 Mg Tabs (Quinapril Hcl) .... Take 1 Tablet By Mouth Once A Day 7)  Ambien 5 Mg Tabs (Zolpidem Tartrate) .... Take 1 Tab By Mouth At Bedtime 8)  Betimol 0.5 % Soln (Timolol) .... One Drop in Each Eye Every Morning 9)  Lantus 100 Unit/ml  Soln (Insulin Glargine) .... Inject 25 Units Under The Skin in The Morning Time, Every Morning 10)  Novolog 100 Unit/ml Soln (Insulin Aspart) .... Inject 5 Units Under The Skin 15 Minutes Befor Each Meal and Make Sure You Check Your Sugar Levels  Current Medications (verified): 1)  Norvasc 10 Mg Tabs (Amlodipine Besylate) .... Take 1 Tablet By Mouth Once A Day 2)  Glucotrol 10 Mg Tabs (Glipizide) .... Take 1 Tablet By Mouth Two Times A Day 3)  Metformin Hcl 1000 Mg  Tabs (Metformin Hcl) .... Take 1 Tablet By Mouth Two Times A Day 4)  Pravastatin Sodium 40 Mg  Tabs (Pravastatin Sodium) .... Take 1 Tablet By Mouth At Bedtime 5)  Hydrochlorothiazide  25 Mg  Tabs (Hydrochlorothiazide) .... Take 1 Tablet By Mouth Once A Day 6)  Accupril 40 Mg Tabs (Quinapril Hcl) .... Take 1 Tablet By Mouth Once A Day 7)  Ambien 5 Mg Tabs (Zolpidem Tartrate) .... Take 1 Tab By Mouth At Bedtime 8)  Betimol 0.5 % Soln (Timolol) .... One Drop in Each Eye Every Morning 9)  Lantus 100 Unit/ml Soln (Insulin Glargine) .... Inject 25 Units Under The Skin in The Morning Time, Every Morning 10)  Novolog 100 Unit/ml Soln (Insulin Aspart) .... Inject 5 Units Under The Skin 15 Minutes Befor Each Meal and Make Sure You Check Your Sugar Levels  Allergies (verified): 1)  ! Doxycycline  Past History:  Past Medical History: Last updated: 08/04/2007 Depression Diabetes mellitus, type II (since 1986), poorly controlled  last HgbA1c 9.4 6/07 Retinopathy, diabetic Nephropathy, proteinuria Hyperlipidemia Hypertension Fibroid uterus, s/p hysterectomy 2004  Past Surgical History: Last updated: 12/23/2005 Hysterectomy, 12/19/02 Oophorectomy, left, 12/19/02  Family History: Last updated: 08/04/2007 Mother:DM2, HTN, stroke in late 45 [place], committed suicide  Social History: Last updated: 06/01/2008 lives alone, works in food service  Risk Factors: Alcohol Use: 0 (06/18/2009) Exercise: yes (06/18/2009)  Risk Factors: Smoking Status: never (06/18/2009) Passive Smoke Exposure: yes (06/18/2009)  Family History: Reviewed history from 08/04/2007 and no changes required. Mother:DM2, HTN, stroke in late 27 [place], committed suicide  Social History: Reviewed history from 06/01/2008 and no changes required. lives alone, works in Ambulance person  Review of Systems       per HPI  Physical Exam  General:  Well-developed,well-nourished,in no acute distress; alert,appropriate and cooperative throughout examination Lungs:  normal respiratory effort, no intercostal retractions, no accessory muscle use, and normal breath sounds.     Heart:  normal rate, regular rhythm, no murmur, and no gallop.   Abdomen:  Bowel sounds positive,abdomen soft and non-tender without masses, organomegaly or hernias noted. Neurologic:  No cranial nerve deficits noted. Station and gait are normal. Plantar reflexes are down-going bilaterally. DTRs are symmetrical throughout. Sensory, motor and coordinative functions appear intact. Psych:  Dysthymic. No suicidal ideation or intentions.  Diabetes Management Exam:    Foot Exam (with socks and/or shoes not present):       Sensory-Monofilament:          Left foot: normal   Impression & Recommendations:  Problem # 1:  HYPERTENSION (ICD-401.9)  At goal, will continue the same regimen.  Her updated medication list for this problem includes:    Norvasc 10 Mg Tabs (Amlodipine besylate) .Marland Kitchen... Take 1 tablet by mouth once a day    Hydrochlorothiazide 25 Mg Tabs (Hydrochlorothiazide) .Marland Kitchen... Take 1 tablet by mouth once a day    Accupril 40 Mg Tabs (Quinapril hcl) .Marland Kitchen... Take 1 tablet by mouth once a  day  BP today: 132/72 Prior BP: 132/84 (06/01/2008)  Labs Reviewed: K+: 3.9 (03/08/2008) Creat: : 0.63 (03/08/2008)   Chol: 136 (02/05/2007)   HDL: 41 (02/05/2007)   LDL: 86 (02/05/2007)   TG: 44 (02/05/2007)  Orders: T-CMP with Estimated GFR ([id number])  Problem # 2:  HYPERLIPIDEMIA (ICD-272.4) Check FLP today and readjust the regimen as indicated.  Her updated medication list for this problem includes:    Pravastatin Sodium 40 Mg Tabs (Pravastatin sodium) .Marland Kitchen... Take 1 tablet by mouth at bedtime  Orders: T-CMP with Estimated GFR ([id number]) T-Lipid Profile [phone])  Labs Reviewed: SGOT: 14 (03/08/2008)   SGPT: 19 (03/08/2008)   HDL:41 (02/05/2007), 55 (06/10/2006)  LDL:86 (02/05/2007), 186 (06/10/2006)  Chol:136 (02/05/2007), 254 (06/10/2006)  Trig:44 (02/05/2007), 66 (06/10/2006)  Problem # 3:  DIABETES MELLITUS, TYPE II (ICD-250.00) Will remove novolog from her list since she tells  me she does not like to take it and is not convinient to her. I will increase her lantus to 35 units with the idea that she will call me back to let me know how her CBGs are running so that I can readjust the regimen as indicated.  The following medications were removed from the medication list:    Novolog 100 Unit/ml Soln (Insulin aspart) ..... Inject 5 units under the skin 15 minutes befor each meal and make sure you check your sugar levels Her updated medication list for this problem includes:    Glucotrol 10 Mg Tabs (Glipizide) .Marland Kitchen... Take 1 tablet by mouth two times a day    Metformin Hcl 1000 Mg Tabs (Metformin hcl) .Marland Kitchen... Take 1 tablet by mouth two times a day    Accupril 40 Mg Tabs (Quinapril hcl) .Marland Kitchen... Take 1 tablet by mouth once a day    Lantus 100 Unit/ml Soln (Insulin glargine) ..... Inject 35 units under the skin in the morning time, every morning  Orders: T- Capillary Blood Glucose [id number]) T-Hgb A1C (in-house) [id number])  Labs Reviewed: Creat: 0.63 (03/08/2008)    Reviewed HgBA1c results: 9.3 (06/18/2009)  9.4 (03/08/2008)  Problem # 4:  DEPRESSION (ICD-311) Well controlled, will cont Ambien as needed.   Complete Medication List: 1)  Norvasc 10 Mg Tabs (Amlodipine besylate) .... Take 1 tablet by mouth once a day 2)  Glucotrol 10 Mg Tabs (Glipizide) .... Take 1 tablet by mouth two times a day 3)  Metformin Hcl 1000 Mg Tabs (Metformin hcl) .... Take 1 tablet by mouth two times a day 4)  Pravastatin Sodium 40 Mg Tabs (Pravastatin sodium) .... Take 1 tablet by mouth at bedtime 5)  Hydrochlorothiazide 25 Mg Tabs (Hydrochlorothiazide) .... Take 1 tablet by mouth once a day 6)  Accupril 40 Mg Tabs (Quinapril hcl) .... Take 1 tablet by mouth once a day 7)  Ambien 5 Mg Tabs (Zolpidem tartrate) .... Take 1 tab by mouth at bedtime 8)  Betimol 0.5 % Soln (Timolol) .... One drop in each eye every morning 9)  Lantus 100 Unit/ml Soln (Insulin glargine) .... Inject 35 units under the skin in  the morning time, every morning  Patient Instructions: 1)  Please schedule a follow-up appointment in 3 months. 2)  Call me in 1 week to let me know about the sugar levels on Lantus 35 units. The medicine we talked about today is called VICTOZA.  3)  Please check your sugar levels regularly and remember to bring the meter with you to the next clinic appointment, if the sugars are > 350 or < 60  please call us at [phone] Prescriptions: AMBIEN 5 MG TABS (ZOLPIDEM TARTRATE) Take 1 tab by mouth at bedtime  #30 x 0   Entered and Authorized by:   Trinidad Curet MD   Signed by:   Trinidad Curet MD on 06/18/2009   Method used:   Print then Give to Patient   RxID:   [id number] LANTUS 100 UNIT/ML SOLN (INSULIN GLARGINE) Inject 35 units under the skin in the morning time, every morning  #1 x 5   Entered and Authorized by:   Trinidad Curet MD   Signed by:   Trinidad Curet MD on 06/18/2009   Method used:   Print then Give to Patient   RxID:   [id number]   Prevention & Chronic Care Immunizations   Influenza vaccine: Historical  (12/21/2003)   Influenza vaccine deferral: Not indicated  (06/18/2009)    Tetanus booster: Not documented   Td booster deferral: Not indicated  (06/18/2009)    Pneumococcal vaccine: Not documented  Other Screening   Pap smear: Not documented   Pap smear action/deferral: Deferred  (06/18/2009)    Mammogram: normal  (11/23/2006)   Mammogram action/deferral: Deferred  (06/18/2009)   Mammogram due: 12/2007   Smoking status: never  (06/18/2009)  Diabetes Mellitus   HgbA1C: 9.3  (06/18/2009)   Hemoglobin A1C due: 09/10/2006    Eye exam: Not documented    Foot exam: yes  (06/18/2009)   Foot exam action/deferral: Do today   High risk foot: No  (03/08/2008)   Foot care education: Done  (06/18/2009)   Foot exam due: 06/10/2007    Urine microalbumin/creatinine ratio: 428.5  (08/04/2007)   Urine microalbumin/cr due: 06/10/2007    Diabetes flowsheet reviewed?:  Yes   Progress toward A1C goal: Unchanged  Lipids   Total Cholesterol: 136  (02/05/2007)   LDL: 86  (02/05/2007)   LDL Direct: Not documented   HDL: 41  (02/05/2007)   Triglycerides: 44  (02/05/2007)   Lipid panel due: 06/10/2007    SGOT (AST): 14  (03/08/2008)   BMP action: Ordered   SGPT (ALT): 19  (03/08/2008)   Alkaline phosphatase: 75  (03/08/2008)   Total bilirubin: 0.4  (03/08/2008)    Lipid flowsheet reviewed?: Yes   Progress toward LDL goal: At goal  Hypertension   Last Blood Pressure: 132 / 72  (06/18/2009)   Serum creatinine: 0.63  (03/08/2008)   BMP action: Not indicated   Serum potassium 3.9  (03/08/2008)    Hypertension flowsheet reviewed?: Yes   Progress toward BP goal: At goal  Self-Management Support :   Personal Goals (by the next clinic visit) :     Personal A1C goal: 7  (06/18/2009)     Personal blood pressure goal: 130/80  (06/18/2009)     Personal LDL goal: 100  (06/18/2009)    Patient will work on the following items until the next clinic visit to reach self-care goals:     Medications and monitoring: check my blood sugar, check my blood pressure, bring all of my medications to every visit, examine my feet every day  (06/18/2009)     Eating: eat more vegetables, use fresh or frozen vegetables, eat foods that are low in salt, eat baked foods instead of fried foods, eat fruit for snacks and desserts  (06/18/2009)     Activity: take a 30 minute walk every day  (06/18/2009)    Diabetes self-management support: Copy of home glucose meter record, Written self-care plan, Education handout, Resources for patients handout  (  06/18/2009)   Diabetes care plan printed   Diabetes education handout printed   Last diabetes self-management training by diabetes educator: 07/07/2007   Last medical nutrition therapy: 06/24/2006    Hypertension self-management support: Written self-care plan, Education handout, Resources for patients handout  (06/18/2009)    Hypertension self-care plan printed.   Hypertension education handout printed    Lipid self-management support: Written self-care plan, Pre-printed educational material, Resources for patients handout  (06/18/2009)   Lipid self-care plan printed.      Resource handout printed.   Nursing Instructions: Diabetic foot exam today    Diabetic Foot Exam Last Podiatry Exam Date: 06/18/2009  Foot Inspection Is there a history of a foot ulcer?              No Is there a foot ulcer now?              No Can the patient see the bottom of their feet?          Yes Are the shoes appropriate in style and fit?          Yes Is there swelling or an abnormal foot shape?          No Are the toenails long?                No Are the toenails thick?                No Are the toenails ingrown?              No Is there heavy callous build-up?              No Is there pain in the calf muscle (Intermittent claudication) when walking?    No Diabetic Foot Care Education Patient educated on appropriate care of diabetic feet.  Pulse Check          Right Foot          Left Foot Dorsalis Pedis:        normal            normal Comments: Some nails are cut too short; pt. reminded not to cut nails too short.   10-g (5.07) Semmes-Weinstein Monofilament Test Performed by: Morrison Old RN          Right Foot          Left Foot Visual Inspection     normal         normal Test Control      normal         normal Site 1         normal         normal Site 2         normal         normal Site 3         normal         normal Site 4         normal         normal Site 5         normal         normal Site 6         normal         normal Site 7         normal         normal Site 8         normal  normal Site 9         normal         normal  Impression                normal  Process Orders Check Orders Results:     [facility]: [id number] not required for this insurance Order queued for  requisitioning for [facility]: June 18, 2009 10:34 AM  Tests Sent for requisitioning (June 18, 2009 10:34 AM):     06/18/2009: [facility] -- T-Lipid Profile [id number] (signed)    Laboratory Results   Blood Tests   Date/Time Received: June 18, 2009 9:54 AM Date/Time Reported: Maryan Rued  June 18, 2009 9:55 AM   HGBA1C: 9.3%   (Normal Range: Non-Diabetic - 3-6%   Control Diabetic - 6-8%) CBG Fasting:: 242mg /dL      Appended Document: est-ck/fu/meds/cfb      Appended Document: est-ck/fu/meds/cfb    Clinical Lists Changes  Problems: Assessed HYPERTENSION as comment only -  Her updated medication list for this problem includes:    Norvasc 10 Mg Tabs (Amlodipine besylate) .Marland Kitchen... Take 1 tablet by mouth once a day    Hydrochlorothiazide 25 Mg Tabs (Hydrochlorothiazide) .Marland Kitchen... Take 1 tablet by mouth once a day    Accupril 40 Mg Tabs (Quinapril hcl) .Marland Kitchen... Take 1 tablet by mouth once a day  Orders: T-CMP with Estimated GFR ([id number])  Orders: Added new Service order of T-CMP with Estimated GFR ([id number]) - Signed       Impression & Recommendations:  Problem # 1:  HYPERTENSION (ICD-401.9)  Her updated medication list for this problem includes:    Norvasc 10 Mg Tabs (Amlodipine besylate) .Marland Kitchen... Take 1 tablet by mouth once a day    Hydrochlorothiazide 25 Mg Tabs (Hydrochlorothiazide) .Marland Kitchen... Take 1 tablet by mouth once a day    Accupril 40 Mg Tabs (Quinapril hcl) .Marland Kitchen... Take 1 tablet by mouth once a day  Orders: T-CMP with Estimated GFR ([id number])   Appended Document: Orders Update    Clinical Lists Changes  Orders: Added new Test order of T-Comprehensive Metabolic Panel (A999333) - Signed

## 2010-02-05 NOTE — Progress Notes (Signed)
Summary: Refill/gh  Phone Note Refill Request Message from:  Fax from Pharmacy on April 30, 2009 11:04 AM  Refills Requested: Medication #1:  HYDROCHLOROTHIAZIDE 25 MG  TABS Take 1 tablet by mouth once a day   Last Refilled: 02/06/2009  Method Requested: Electronic Initial call taken by: Sander Nephew RN,  April 30, 2009 11:04 AM  Follow-up for Phone Call        completed refill, thank you Lennis Korb  Follow-up by: Trinidad Curet MD,  April 30, 2009 11:24 AM    Prescriptions: HYDROCHLOROTHIAZIDE 25 MG  TABS (HYDROCHLOROTHIAZIDE) Take 1 tablet by mouth once a day  #31 x 4   Entered and Authorized by:   Trinidad Curet MD   Signed by:   Trinidad Curet MD on 04/30/2009   Method used:   Electronically to        [facility] (retail)       [address]       [place], [place]  [address]       Ph: [phone]       Fax: [phone]   RxID:   [id number]

## 2010-02-07 NOTE — Assessment & Plan Note (Signed)
Summary: est-ck/fu/meds/cfb   Vital Signs:  Patient profile:   50 year old female Height:      61.5 inches (156.21 cm) Weight:      173.2 pounds (78.73 kg) BMI:     32.31 Temp:     97.5 degrees F (36.39 degrees C) oral Pulse rate:   82 / minute BP sitting:   168 / 84  (left arm)  Vitals Entered By: Hilda Blades Ditzler RN (January 01, 2010 1:35 PM) Is Patient Diabetic? Yes Did you bring your meter with you today? No Pain Assessment Patient in pain? no      Nutritional Status BMI of > 30 = obese Nutritional Status Detail appetite good CBG Result 194  Have you ever been in a relationship where you felt threatened, hurt or afraid?denies   Does patient need assistance? Functional Status Self care Ambulation Normal Comments Refills on meds and discuss CBG.   Primary Care Provider:  Domingo Mend MD   History of Present Illness: 50 yo female with PMH outlined below presents to [facility] for regular follow up.Marland Kitchen She wants to discuss the diabetic regimen and is also reporting diarrhea from metformin so wants to know if she can stop using it. She has been checking her sugar levels daily and reports they are running high in 300's but reports loosing weight on victoza. She denies any recent sicknesses or hospitalizations, no chest pain, no fever, chills, no abd or urinary concerns. No chagnes in appetite and only voluntary weight loss.  Takes her BP and cholesterol medications.   Depression History:      The patient denies a depressed mood most of the day and a diminished interest in her usual daily activities.  The patient denies significant weight loss, significant weight gain, insomnia, hypersomnia, psychomotor agitation, psychomotor retardation, fatigue (loss of energy), feelings of worthlessness (guilt), impaired concentration (indecisiveness), and recurrent thoughts of death or suicide.        The patient denies that she feels like life is not worth living, denies that she wishes that  she were dead, and denies that she has thought about ending her life.         Preventive Screening-Counseling & Management  Alcohol-Tobacco     Alcohol drinks/day: 0     Smoking Status: never     Passive Smoke Exposure: yes  Caffeine-Diet-Exercise     Does Patient Exercise: yes     Type of exercise: WALKING-occasionally     Times/week: 1  Problems Prior to Update: 1)  Middle Ear Infection  ([id number]) 2)  Systolic Murmur  ([id number]) 3)  Nephropathy, Diabetic  (ICD-250.40) 4)  Uti  (ICD-599.0) 5)  Health Maintenance Exam  (ICD-V70.0) 6)  Obesity Nos  (ICD-278.00) 7)  Anemia-iron Deficiency  (ICD-280.9) 8)  Diabetic Retinopathy  (ICD-250.50) 9)  Hx of Hysterectomy, Hx of  (ICD-V45.77) 10)  Hypertension  (ICD-401.9) 11)  Hyperlipidemia  (ICD-272.4) 12)  Diabetes Mellitus, Type II  (ICD-250.00) 13)  Depression  (ICD-311)  Medications Prior to Update: 1)  Norvasc 10 Mg Tabs (Amlodipine Besylate) .... Take 1 Tablet By Mouth Once A Day 2)  Glucotrol 10 Mg Tabs (Glipizide) .... Take 1 Tablet By Mouth Two Times A Day 3)  Metformin Hcl 1000 Mg  Tabs (Metformin Hcl) .... Take 1 Tablet By Mouth Two Times A Day 4)  Pravastatin Sodium 40 Mg  Tabs (Pravastatin Sodium) .... Take 1 Tablet By Mouth At Bedtime 5)  Hydrochlorothiazide 25 Mg  Tabs (Hydrochlorothiazide) .... Take 1  Tablet By Mouth Once A Day 6)  Accupril 40 Mg Tabs (Quinapril Hcl) .... Take 1 Tablet By Mouth Once A Day 7)  Ambien 5 Mg Tabs (Zolpidem Tartrate) .... Take 1 Tab By Mouth At Bedtime 8)  Betimol 0.5 % Soln (Timolol) .... One Drop in Each Eye Every Morning 9)  Lantus 100 Unit/ml Soln (Insulin Glargine) .... Inject 35 Units Under The Skin in The Morning Time, Every Morning 10)  Victoza 18 Mg/39ml Soln (Liraglutide) .... Inject 0.6 Units Under The Skin Once Daily  Current Medications (verified): 1)  Norvasc 10 Mg Tabs (Amlodipine Besylate) .... Take 1 Tablet By Mouth Once A Day 2)  Glucotrol 10 Mg Tabs (Glipizide) ....  Take 1 Tablet By Mouth Two Times A Day 3)  Metformin Hcl 1000 Mg  Tabs (Metformin Hcl) .... Take 1 Tablet By Mouth Two Times A Day 4)  Pravastatin Sodium 40 Mg  Tabs (Pravastatin Sodium) .... Take 1 Tablet By Mouth At Bedtime 5)  Hydrochlorothiazide 25 Mg  Tabs (Hydrochlorothiazide) .... Take 1 Tablet By Mouth Once A Day 6)  Accupril 40 Mg Tabs (Quinapril Hcl) .... Take 1 Tablet By Mouth Once A Day 7)  Ambien 5 Mg Tabs (Zolpidem Tartrate) .... Take 1 Tab By Mouth At Bedtime 8)  Betimol 0.5 % Soln (Timolol) .... One Drop in Each Eye Every Morning 9)  Lantus 100 Unit/ml Soln (Insulin Glargine) .... Inject 35 Units Under The Skin in The Morning Time, Every Morning 10)  Victoza 18 Mg/67ml Soln (Liraglutide) .... Inject 0.6 Units Under The Skin Once Daily  Allergies: 1)  ! Doxycycline  Past History:  Past Medical History: Last updated: 08/04/2007 Depression Diabetes mellitus, type II (since 1986), poorly controlled  last HgbA1c 9.4 6/07 Retinopathy, diabetic Nephropathy, proteinuria Hyperlipidemia Hypertension Fibroid uterus, s/p hysterectomy 2004  Past Surgical History: Last updated: 12/23/2005 Hysterectomy, 12/19/02 Oophorectomy, left, 12/19/02  Family History: Last updated: 08/04/2007 Mother:DM2, HTN, stroke in late fifties [place], committed suicide  Social History: Last updated: 06/01/2008 lives alone, works in food service  Risk Factors: Alcohol Use: 0 (01/01/2010) Exercise: yes (01/01/2010)  Risk Factors: Smoking Status: never (01/01/2010) Passive Smoke Exposure: yes (01/01/2010)  Family History: Reviewed history from 08/04/2007 and no changes required. Mother:DM2, HTN, stroke in late 7 [place], committed suicide  Social History: Reviewed history from 06/01/2008 and no changes required. lives alone, works in Ambulance person  Review of Systems       per HPI  Physical Exam  General:  Well-developed,well-nourished,in no acute  distress; alert,appropriate and cooperative throughout examination Lungs:  normal respiratory effort, no intercostal retractions, no accessory muscle use, and normal breath sounds.   Heart:  normal rate, regular rhythm, no murmur, and no gallop.   Abdomen:  Bowel sounds positive,abdomen soft and non-tender without masses, organomegaly or hernias noted. Psych:  Dysthymic. No suicidal ideation or intentions.  Diabetes Management Exam:    Foot Exam (with socks and/or shoes not present):       Sensory-Monofilament:          Left foot: normal          Right foot: normal   Impression & Recommendations:  Problem # 1:  HYPERTENSION (ICD-401.9)  Uncontrolled today, but reports her BP usually WNL at home or when she checks it at the pharmacy. I suspect a component of noncompliance here and she did report that occasionally she skips the medications. I would like to simplify this for her and will change to combo pill lisinopril-HCTZ once  daily and will see if her BP gets better. I have discussed this with her in detail and will follow up on it in next 2 weeks.  The following medications were removed from the medication list:    Accupril 40 Mg Tabs (Quinapril hcl) .Marland Kitchen... Take 1 tablet by mouth once a day Her updated medication list for this problem includes:    Norvasc 10 Mg Tabs (Amlodipine besylate) .Marland Kitchen... Take 1 tablet by mouth once a day    Lisinopril-hydrochlorothiazide 20-12.5 Mg Tabs (Lisinopril-hydrochlorothiazide) .Marland Kitchen... Take one tablet twice daily  BP today: 168/84 Prior BP: 132/72 (06/18/2009)  Labs Reviewed: K+: 3.6 (06/18/2009) Creat: : 0.67 (06/18/2009)   Chol: 167 (06/18/2009)   HDL: 41 (06/18/2009)   LDL: 112 (06/18/2009)   TG: 70 (06/18/2009)  The following medications were removed from the medication list:    Accupril 40 Mg Tabs (Quinapril hcl) .Marland Kitchen... Take 1 tablet by mouth once a day Her updated medication list for this problem includes:    Norvasc 10 Mg Tabs (Amlodipine besylate)  .Marland Kitchen... Take 1 tablet by mouth once a day    Lisinopril-hydrochlorothiazide 20-12.5 Mg Tabs (Lisinopril-hydrochlorothiazide) .Marland Kitchen... Take one tablet twice daily  Problem # 2:  HYPERLIPIDEMIA (ICD-272.4)  Check FLP on her nex visit and readjust the regimen as indicated.  Her updated medication list for this problem includes:    Pravastatin Sodium 40 Mg Tabs (Pravastatin sodium) .Marland Kitchen... Take 1 tablet by mouth at bedtime  Labs Reviewed: SGOT: 11 (06/18/2009)   SGPT: 19 (06/18/2009)   HDL:41 (06/18/2009), 41 (02/05/2007)  LDL:112 (06/18/2009), 86 (02/05/2007)  Chol:167 (06/18/2009), 136 (02/05/2007)  Trig:70 (06/18/2009), 44 (02/05/2007)  Her updated medication list for this problem includes:    Pravastatin Sodium 40 Mg Tabs (Pravastatin sodium) .Marland Kitchen... Take 1 tablet by mouth at bedtime  Problem # 3:  DIABETES MELLITUS, TYPE II (ICD-250.00) A1C worsening and I suspect noncompliance as well. She has been having diarrhea from metformin so she was likley skipping it and for that reason I will discuss with Butch Penny further plan for the patient. We can increase her lantus and victosa and will d/cmetformin and glipizide. I have examined her feet today and the results of PE are WNL. I will follow up on her CBG control and will readjust the regimen in 2 weeks when she comes back. Will make referal to Barnabas Harries.  The following medications were removed from the medication list:    Glucotrol 10 Mg Tabs (Glipizide) .Marland Kitchen... Take 1 tablet by mouth two times a day    Metformin Hcl 1000 Mg Tabs (Metformin hcl) .Marland Kitchen... Take 1 tablet by mouth two times a day    Accupril 40 Mg Tabs (Quinapril hcl) .Marland Kitchen... Take 1 tablet by mouth once a day Her updated medication list for this problem includes:    Lisinopril-hydrochlorothiazide 20-12.5 Mg Tabs (Lisinopril-hydrochlorothiazide) .Marland Kitchen... Take one tablet twice daily    Lantus 100 Unit/ml Soln (Insulin glargine) ..... Inject 40 units under the skin in the morning time, every morning, come  back to clinic in 2 weeks for further directions    Victoza 18 Mg/36ml Soln (Liraglutide) ..... Inject 1.2 units under the skin once daily  Orders: T- Capillary Blood Glucose [id number]) T-Hgb A1C (in-house) [id number]) Diabetic Clinic Referral (Diabetic)  Labs Reviewed: Creat: 0.67 (06/18/2009)    Reviewed HgBA1c results: 10.5 (01/01/2010)  9.3 (06/18/2009)  Complete Medication List: 1)  Norvasc 10 Mg Tabs (Amlodipine besylate) .... Take 1 tablet by mouth once a day 2)  Pravastatin Sodium 40  Mg Tabs (Pravastatin sodium) .... Take 1 tablet by mouth at bedtime 3)  Lisinopril-hydrochlorothiazide 20-12.5 Mg Tabs (Lisinopril-hydrochlorothiazide) .... Take one tablet twice daily 4)  Ambien 5 Mg Tabs (Zolpidem tartrate) .... Take 1 tab by mouth at bedtime 5)  Betimol 0.5 % Soln (Timolol) .... One drop in each eye every morning 6)  Lantus 100 Unit/ml Soln (Insulin glargine) .... Inject 40 units under the skin in the morning time, every morning, come back to clinic in 2 weeks for further directions 7)  Victoza 18 Mg/39ml Soln (Liraglutide) .... Inject 1.2 units under the skin once daily  Patient Instructions: 1)  Please schedule a follow-up appointment in 2 weeks. 2)  Please check your blood pressure regularly, if it is >170 please call clinic at [phone])  Please check your sugar levels regularly and remember to bring the meter with you to the next clinic appointment, if the sugars are > 350 or < 60 please call us at [phone] Prescriptions: AMBIEN 5 MG TABS (ZOLPIDEM TARTRATE) Take 1 tab by mouth at bedtime  #30 x 5   Entered and Authorized by:   Trinidad Curet MD   Signed by:   Trinidad Curet MD on 01/01/2010   Method used:   Print then Give to Patient   RxID:   [id number] PRAVASTATIN SODIUM 40 MG  TABS (PRAVASTATIN SODIUM) Take 1 tablet by mouth at bedtime  #30 x 3   Entered and Authorized by:   Trinidad Curet MD   Signed by:   Trinidad Curet MD on 01/01/2010   Method used:   Faxed to ...        [facility] (retail)       [place], [place]  [address]       Ph: [phone]       Fax: [phone]   RxID:   [id number] NORVASC 10 MG TABS (AMLODIPINE BESYLATE) Take 1 tablet by mouth once a day  #30 x 7   Entered and Authorized by:   Trinidad Curet MD   Signed by:   Trinidad Curet MD on 01/01/2010   Method used:   Faxed to ...       [facility] (retail)       [place], [place]  [address]       Ph: [phone]       Fax: [phone]   RxID:   [id number] BETIMOL 0.5 % SOLN (TIMOLOL) One drop in each eye every morning  #1 x 5   Entered and Authorized by:   Trinidad Curet MD   Signed by:   Trinidad Curet MD on 01/01/2010   Method used:   Faxed to ...       [facility] (retail)       [place],   [address]       Ph: [phone]       Fax: [phone]   RxID:   [id number] LANTUS 100 UNIT/ML SOLN (INSULIN GLARGINE) Inject 40 units under the skin in the morning time, every morning, come back to clinic in 2 weeks for further directions  #1 x 1   Entered and Authorized by:   Trinidad Curet MD   Signed by:   Trinidad Curet MD on 01/01/2010   Method used:   Print then Give to Patient   RxID:  [id number] VICTOZA 18 MG/3ML SOLN (LIRAGLUTIDE) inject 1.2 units under the skin once daily  #1 x 3   Entered and Authorized by:   Trinidad Curet MD   Signed by:   Trinidad Curet MD on 01/01/2010   Method used:   Print then Give to Patient   RxID:   [id number] LISINOPRIL-HYDROCHLOROTHIAZIDE 20-12.5 MG TABS (LISINOPRIL-HYDROCHLOROTHIAZIDE) take one tablet twice daily  #60 x 3   Entered and Authorized by:   Trinidad Curet MD   Signed by:   Trinidad Curet MD on 01/01/2010   Method used:   Print then Give to Patient   RxID:   [id number]    Orders Added: 1)  T- Capillary Blood Glucose [82948] 2)  T-Hgb A1C (in-house) [id number] 3)  Diabetic Clinic  Referral [Diabetic] 4)  Est. Patient Level III [id number]     Diabetic Foot Exam Last Podiatry Exam Date: 01/01/2010  Foot Inspection Is there a history of a foot ulcer?              No Is there a foot ulcer now?              No Is there swelling or an abnormal foot shape?          No Are the toenails long?                No Are the toenails thick?                No Are the toenails ingrown?              No Is there heavy callous build-up?              No Is there pain in the calf muscle (Intermittent claudication) when walking?    NoIs there a claw toe deformity?              No Is there elevated skin temperature?            No Is there limited ankle dorsiflexion?            No Is there foot or ankle muscle weakness?            No  Diabetic Foot Care Education Pulse Check          Right Foot          Left Foot Posterior Tibial:        normal            normal Dorsalis Pedis:        normal            normal  High Risk Feet? No   10-g (5.07) Semmes-Weinstein Monofilament Test Performed by: Trinidad Curet MD          Right Foot          Left Foot Visual Inspection     normal         normal Test Control      normal         normal Site 1         normal         normal Site 2         normal         normal Site 3         normal         normal Site 4         normal  normal Site 5         normal         normal Site 6         normal         normal Site 7         normal         normal Site 8         normal         normal Site 9         normal         normal Site 10         normal         normal  Impression      normal         normal  Legend:  Site 1 = Plantar aspect of first toe (center of pad) Site 2 = Plantar aspect of third toe (center of pad) Site 3 = Plantar aspect of fifth toe (center of pad) Site 4 = Plantar aspect of first metatarsal head Site 5 = Plantar aspect of third metatarsal head Site 6 = Plantar aspect of fifth metatarsal head Site 7 = Plantar aspect of  medial midfoot Site 8 = Plantar aspect of lateral midfoot Site 9 = Plantar aspect of heel Site 10 = dorsal aspect of foot between the base of the first and second toes   Result is Abnormal if patient was unable to perceive the monofilament at site indicated.    Prevention & Chronic Care Immunizations   Influenza vaccine: Historical  (12/21/2003)   Influenza vaccine deferral: Not indicated  (06/18/2009)    Tetanus booster: Not documented   Td booster deferral: Not indicated  (06/18/2009)    Pneumococcal vaccine: Not documented  Other Screening   Pap smear: Not documented   Pap smear action/deferral: Deferred  (06/18/2009)    Mammogram: normal  (11/23/2006)   Mammogram action/deferral: Deferred  (06/18/2009)   Mammogram due: 12/2007   Smoking status: never  (01/01/2010)  Diabetes Mellitus   HgbA1C: 10.5  (01/01/2010)   Hemoglobin A1C due: 09/10/2006    Eye exam: Not documented   Diabetic eye exam action/deferral: Deferred  (01/01/2010)    Foot exam: yes  (01/01/2010)   Foot exam action/deferral: Do today   High risk foot: No  (01/01/2010)   Foot care education: Done  (06/18/2009)   Foot exam due: 06/10/2007    Urine microalbumin/creatinine ratio: 428.5  (08/04/2007)   Urine microalbumin/cr due: 06/10/2007    Diabetes flowsheet reviewed?: Yes   Progress toward A1C goal: Deteriorated  Lipids   Total Cholesterol: 167  (06/18/2009)   LDL: 112  (06/18/2009)   LDL Direct: Not documented   HDL: 41  (06/18/2009)   Triglycerides: 70  (06/18/2009)   Lipid panel due: 06/10/2007    SGOT (AST): 11  (06/18/2009)   BMP action: Ordered   SGPT (ALT): 19  (06/18/2009)   Alkaline phosphatase: 82  (06/18/2009)   Total bilirubin: 0.4  (06/18/2009)    Lipid flowsheet reviewed?: Yes   Progress toward LDL goal: Deteriorated  Hypertension   Last Blood Pressure: 168 / 84  (01/01/2010)   Serum creatinine: 0.67  (06/18/2009)   BMP action: Not indicated   Serum potassium 3.6   (06/18/2009)    Hypertension flowsheet reviewed?: Yes   Progress toward BP goal: Deteriorated  Self-Management Support :   Personal Goals (by the next clinic visit) :     Personal A1C goal: 7  (  06/18/2009)     Personal blood pressure goal: 130/80  (06/18/2009)     Personal LDL goal: 100  (06/18/2009)    Patient will work on the following items until the next clinic visit to reach self-care goals:     Medications and monitoring: take my medicines every day, check my blood sugar, check my blood pressure, bring all of my medications to every visit, weigh myself weekly, examine my feet every day  (01/01/2010)     Eating: drink diet soda or water instead of juice or soda, eat more vegetables, use fresh or frozen vegetables, eat foods that are low in salt, eat fruit for snacks and desserts  (01/01/2010)     Activity: take a 30 minute walk every day, take the stairs instead of the elevator, park at the far end of the parking lot  (01/01/2010)    Diabetes self-management support: Written self-care plan, Education handout, Resources for patients handout  (01/01/2010)   Diabetes care plan printed   Diabetes education handout printed   Last diabetes self-management training by diabetes educator: 07/07/2007   Referred for diabetes self-mgmt training.   Last medical nutrition therapy: 06/24/2006    Hypertension self-management support: Written self-care plan, Education handout, Resources for patients handout  (01/01/2010)   Hypertension self-care plan printed.   Hypertension education handout printed    Lipid self-management support: Written self-care plan, Education handout, Resources for patients handout  (01/01/2010)   Lipid self-care plan printed.   Lipid education handout printed      Resource handout printed.   Nursing Instructions: Diabetic foot exam today    Last LDL:                                                 112 (06/18/2009 10:59:00 PM)        Diabetic Foot Exam Last  Podiatry Exam Date: 01/01/2010  Pulse Check          Right Foot          Left Foot Posterior Tibial:        normal            normal Dorsalis Pedis:        normal            normal Comments: Dr Donnella Bi does own foot exams. Debra Ditzler RN High Risk Feet? No   10-g (5.07) Semmes-Weinstein Monofilament Test Performed by: Trinidad Curet MD          Right Foot          Left Foot Visual Inspection     normal         normal    Laboratory Results   Blood Tests   Date/Time Received: January 01, 2010 1:54 PM Date/Time Reported: .sign  HGBA1C: 10.5%   (Normal Range: Non-Diabetic - 3-6%   Control Diabetic - 6-8%) CBG Random:: 194mg /dL

## 2010-02-07 NOTE — Letter (Signed)
Summary: LOG BOOK REPORT  LOG BOOK REPORT   Imported By: Garlan Fillers 01/22/2010 10:49:35  _____________________________________________________________________  External Attachment:    Type:   Image     Comment:   External Document

## 2010-02-07 NOTE — Assessment & Plan Note (Signed)
Summary: ACUTE/MAGICK/2 WEEK RECHECK/CH   Vital Signs:  Patient profile:   50 year old female Height:      61.5 inches (156.21 cm) Weight:      172.3 pounds (78.32 kg) BMI:     32.14 Temp:     97.3 degrees F oral Pulse rate:   72 / minute BP sitting:   152 / 77  (right arm) Cuff size:   regular  Vitals Entered By: Morrison Old RN (January 16, 2010 9:22 AM) CC: Re-check BP. Flu shot.  Is Patient Diabetic? Yes Did you bring your meter with you today? Yes Pain Assessment Patient in pain? no      Nutritional Status BMI of > 30 = obese CBG Result 219  Have you ever been in a relationship where you felt threatened, hurt or afraid?No   Does patient need assistance? Functional Status Self care Ambulation Normal   Diabetic Foot Exam Last Podiatry Exam Date: 01/01/2010  Set Next Diabetic Foot Exam here: 01/17/2011   10-g (5.07) Semmes-Weinstein Monofilament Test Performed by: Morrison Old RN          Right Foot          Left Foot Visual Inspection     normal           normal  Impression      normal         normal   Primary Care Provider:  Trinidad Curet MD  CC:  Re-check BP. Flu shot. .  History of Present Illness: Follow up on DM. Patient states that she did not implement change in her regimen --awaits for her Lantus and victoza through [facility]. Denies any new concerns.  Depression History:      The patient denies a depressed mood most of the day and a diminished interest in her usual daily activities.         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 Take 1 Tablet By Mouth Once A Day 2)  Lipitor 40 Mg Tabs (Atorvastatin Calcium) .... Take 1 Tab By Mouth At Bedtime 3)  Lisinopril-Hydrochlorothiazide 20-12.5 Mg Tabs (Lisinopril-Hydrochlorothiazide) .... Take One Tablet Twice Daily 4)  Ambien 5 Mg Tabs (Zolpidem Tartrate) .... Take 1 Tab By Mouth At Bedtime 5)  Betimol 0.5 % Soln (Timolol) .... One Drop in Each Eye Every Morning 6)  Lantus 100 Unit/ml Soln (Insulin Glargine) .... Inject 40 Units Under The Skin in The Morning Time, Every Morning, Come Back To Clinic in 2 Weeks For Further Directions 7)  Victoza 18 Mg/28ml Soln (Liraglutide) .... Inject 1.2 Units Under The Skin Once Daily  Allergies (verified): 1)  !  Doxycycline  Directives (verified): 1)  Full Code   Physical Exam  General:  Well-developed,well-nourished,in no acute distress; alert,appropriate and cooperative throughout examination Eyes:  vision grossly intact, pupils equal, pupils round, pupils reactive to light, corneas and lenses clear, and no injection.   Nose:  External nasal examination shows no deformity or inflammation. Nasal mucosa are pink and moist without lesions or exudates. Mouth:  pharynx pink and moist and no erythema.   Lungs:  normal respiratory effort, no intercostal retractions, no accessory muscle use, and normal breath sounds.   Heart:  normal rate, regular rhythm, no murmur, and no gallop.   Abdomen:  Bowel sounds positive,abdomen soft and non-tender without masses, organomegaly or hernias noted. Msk:  No deformity or  scoliosis noted of thoracic or lumbar spine.   Pulses:  R and L dorsalis pedis and posterior tibial pulses are full and equal bilaterally Extremities:  No clubbing, cyanosis, edema, or deformity noted with normal full range of motion of all joints.   Neurologic:  No cranial nerve deficits noted. Station and gait are normal. Plantar reflexes are down-going bilaterally. DTRs are symmetrical throughout. Sensory, motor and coordinative functions appear intact. Psych:  Dysthymic. No suicidal ideation or intentions.memory intact for recent and remote, normally interactive, good eye contact, and not anxious appearing.    Diabetes Management Exam:    Foot Exam (with socks and/or shoes not present):       Sensory-Pinprick/Light touch:          Left medial foot (L-4): diminished          Left dorsal foot (L-5): diminished          Left lateral foot (S-1): diminished          Right medial foot (L-4): diminished          Right dorsal foot (L-5): diminished          Right lateral foot (S-1): diminished       Sensory-Monofilament:          Left foot: diminished          Right foot: diminished       Inspection:          Left foot: normal          Right foot: normal       Nails:          Left foot: thickened          Right foot: thickened    Foot Exam by Podiatrist:       Date: 01/16/2010       Results: early diabetic findings       Done by: Stanford Scotland   Impression & Recommendations:  Problem # 1:  DIABETIC  RETINOPATHY (ICD-250.50) Assessment Unchanged Referred to an opthalmologist. Her updated medication list for this problem includes:    Lisinopril-hydrochlorothiazide 20-12.5 Mg Tabs (Lisinopril-hydrochlorothiazide) .Marland Kitchen... Take one tablet twice daily    Lantus 100 Unit/ml Soln (Insulin glargine) ..... Inject 40 units under the skin in the morning time, every morning, come back to clinic in 2 weeks for further directions    Victoza 18 Mg/69ml Soln (Liraglutide) ..... Inject 1.2 units under the  skin once daily  Labs Reviewed: Creat: 0.67 (06/18/2009)    Reviewed HgBA1c results: 10.5 (01/01/2010)  9.3 (06/18/2009)  Problem # 2:  HYPERTENSION (ICD-401.9) Assessment: Improved Improved but still suboptimal control. Patient declined to revise her anit-HTN medication regimen at this time. Will recheck in 2 weeks and if  still elevated ->consider to add another agent. Patient agreed with the plan. Her updated medication list for this problem includes:    Norvasc 10 Mg Tabs (Amlodipine besylate) .Marland Kitchen... Take 1 tablet by mouth once a day    Lisinopril-hydrochlorothiazide 20-12.5 Mg Tabs (Lisinopril-hydrochlorothiazide) .Marland Kitchen... Take one tablet twice daily  BP today: 152/77 Prior BP: 168/84 (01/01/2010)  Labs Reviewed: K+: 3.6 (06/18/2009) Creat: : 0.67 (06/18/2009)   Chol: 167 (06/18/2009)   HDL: 41 (06/18/2009)   LDL: 112 (06/18/2009)   TG: 70 (06/18/2009)  Problem # 3:  DIABETES MELLITUS, TYPE II (ICD-250.00) Assessment: Unchanged  Patient did not start a new regimen with Lantus 40 units Subcutaneously as was Rx-ed 2 weeks ago ->awaiting for her Rx from [facility] (as well as for Victoza). Patient is motivated ->wqatches her diet and tries to exercise. Patient was given a sample of a Lantus Solostar pen#1 ->instructed to increase dose to 40 Units Sq q am Her updated medication list for this problem includes:    Lisinopril-hydrochlorothiazide 20-12.5 Mg Tabs (Lisinopril-hydrochlorothiazide) .Marland Kitchen... Take one tablet twice daily    Lantus 100 Unit/ml Soln (Insulin glargine) ..... Inject 35 units under the skin in the morning time, every morning, come back to clinic in 2 weeks for further directions    Victoza 18 Mg/66ml Soln (Liraglutide) ..... Inject 1.2 units under the skin once daily  Orders: Ophthalmology Referral (Ophthalmology) T-Urine Microalbumin w/creat. ratio [phone]) Capillary Blood Glucose/CBG [phone])  Labs Reviewed: Creat: 0.67 (06/18/2009)     Reviewed HgBA1c results: 10.5 (01/01/2010)  9.3 (06/18/2009)  Her updated medication list for this problem includes:    Lisinopril-hydrochlorothiazide 20-12.5 Mg Tabs (Lisinopril-hydrochlorothiazide) .Marland Kitchen... Take one tablet twice daily    Lantus 100 Unit/ml Soln (Insulin glargine) ..... Inject 40 units under the skin in the morning time, every morning, come back to clinic in 2 weeks for further directions    Victoza 18 Mg/70ml Soln (Liraglutide) ..... Inject 1.2 units under the skin once daily  Problem # 4:  DEPRESSION (ICD-311) Assessment: Unchanged Denies SI/HI or mania. Declined medication therapy or a referral to Behavioral health. Discussed treatment options, including trial of antidpressant medication. F Patient agrees to call if any worsening of symptoms or thoughts of doing harm arise. Verified that the patient has no suicidal ideation at this time.   Complete Medication List: 1)  Norvasc 10 Mg Tabs (Amlodipine besylate) .... Take 1 tablet by mouth once a day 2)  Lipitor 40 Mg Tabs (Atorvastatin calcium) .... Take 1 tab by mouth at bedtime 3)  Lisinopril-hydrochlorothiazide 20-12.5 Mg Tabs (Lisinopril-hydrochlorothiazide) .... Take one tablet twice daily 4)  Ambien 5 Mg Tabs (Zolpidem tartrate) .... Take 1 tab by mouth at bedtime 5)  Betimol 0.5 % Soln (Timolol) .... One drop in each eye every morning 6)  Lantus 100 Unit/ml Soln (Insulin glargine) .... Inject 40 units under the skin in the morning time, every morning, come back to clinic in 2 weeks for further directions 7)  Victoza 18 Mg/59ml Soln (Liraglutide) .... Inject 1.2 units under the skin once daily  Other Orders: Mammogram (Screening) (Mammo) Tdap => 16yrs IM [id number]) Pneumococcal Vaccine [id number]) Admin 1st Vaccine [id number]) Admin of Any Addtl Vaccine [id number]) Influenza Vaccine NON MCR [id number])  Patient Instructions: 1)  Please, DO NOT ran out of insulin --if you do, please call us ASAP. 2)  Please, continue with weight  management as was advised. 3)  Please, call with any questions and make a follow up a ppointment with  Dr. Donnella Bi in 2 weeks.   Orders Added: 1)  Ophthalmology Referral [Ophthalmology] 2)  T-Urine Microalbumin w/creat. ratio [[id number]] 3)  Capillary Blood Glucose/CBG [82948] 4)  Est. Patient Level III [id number] 5)  Mammogram (Screening) [Mammo] 6)  Tdap => 69yrs IM [90715] 7)  Pneumococcal Vaccine [90732] 8)  Admin 1st Vaccine [90471] 9)  Admin of Any Addtl Vaccine [90472] 10)  Influenza Vaccine NON MCR [00028]   Immunizations Administered:  Tetanus Vaccine:    Vaccine Type: Tdap    Site: left deltoid    Mfr: GlaxoSmithKline    Dose: 0.5 ml    Route: IM    Given by: Morrison Old RN    Exp. Date: 10/26/2011    Lot #: [id number]    VIS given: 11/24/07 version given January 16, 2010.  Pneumonia Vaccine:    Vaccine Type: Pneumovax    Site: right deltoid    Mfr: Merck    Dose: 0.5 ml    Route: IM    Given by: Morrison Old RN    Exp. Date: 05/18/2011    Lot #: [id number]    VIS given: 12/11/08 version given January 16, 2010.  Influenza Vaccine # 1:    Vaccine Type: Fluvax Non-MCR    Site: right deltoid    Mfr: GlaxoSmithKline    Dose: 0.5 ml    Route: IM    Given by: Morrison Old RN    Exp. Date: 07/06/2010    Lot #: [id number]    VIS given: 07/31/09 version given January 16, 2010.  Flu Vaccine Consent Questions:    Do you have a history of severe allergic reactions to this vaccine? no    Any prior history of allergic reactions to egg and/or gelatin? no    Do you have a sensitivity to the preservative Thimersol? no    Do you have a past history of Guillan-Barre Syndrome? no    Do you currently have an acute febrile illness? no    Have you ever had a severe reaction to latex? no    Vaccine information given and explained to patient? yes    Are you currently pregnant? no   Immunizations Administered:  Tetanus Vaccine:    Vaccine Type: Tdap    Site: left  deltoid    Mfr: GlaxoSmithKline    Dose: 0.5 ml    Route: IM    Given by: Morrison Old RN    Exp. Date: 10/26/2011    Lot #: [id number]    VIS given: 11/24/07 version given January 16, 2010.  Pneumonia Vaccine:    Vaccine Type: Pneumovax    Site: right deltoid    Mfr: Merck    Dose: 0.5 ml    Route: IM    Given by: Morrison Old RN    Exp. Date: 05/18/2011    Lot #: [id number]    VIS given: 12/11/08 version given January 16, 2010.  Influenza Vaccine # 1:    Vaccine Type: Fluvax Non-MCR    Site: right deltoid    Mfr: GlaxoSmithKline    Dose: 0.5 ml    Route: IM    Given by: Morrison Old RN    Exp. Date: 07/06/2010    Lot #: [id number]    VIS given: 07/31/09 version given January 16, 2010.  Prevention & Chronic Care Immunizations   Influenza vaccine: Fluvax Non-MCR  (01/16/2010)   Influenza vaccine deferral: Not indicated  (06/18/2009)   Influenza vaccine due: 09/07/2011    Tetanus booster: 01/16/2010: Tdap  Td booster deferral: Not indicated  (06/18/2009)   Tetanus booster due: 01/17/2020    Pneumococcal vaccine: Pneumovax  (01/16/2010)   Pneumococcal vaccine due: 01/17/2015  Other Screening   Pap smear: Not documented   Pap smear action/deferral: Deferred  (06/18/2009)   Pap smear due: 01/16/2010    Mammogram: normal  (11/23/2006)   Mammogram action/deferral: Ordered  (01/16/2010)   Mammogram due: 12/2007   Smoking status: never  (01/16/2010)  Diabetes Mellitus   HgbA1C: 10.5  (01/01/2010)   Hemoglobin A1C due: 04/02/2010    Eye exam: Not documented   Diabetic eye exam action/deferral: Ophthalmology referral  (01/16/2010)    Foot exam: yes  (01/16/2010)   Foot exam action/deferral: Do today   High risk foot: No  (01/01/2010)   Foot care education: Done  (06/18/2009)   Foot exam due: 01/17/2011    Urine microalbumin/creatinine ratio: 428.5  (08/04/2007)   Urine microalbumin action/deferral: Ordered   Urine microalbumin/cr due: 01/17/2011     Diabetes flowsheet reviewed?: Yes   Progress toward A1C goal: Unchanged    Stage of readiness to change (diabetes management): Action  Lipids   Total Cholesterol: 167  (06/18/2009)   LDL: 112  (06/18/2009)   LDL Direct: Not documented   HDL: 41  (06/18/2009)   Triglycerides: 70  (06/18/2009)   Lipid panel due: 06/19/2010    SGOT (AST): 11  (06/18/2009)   BMP action: Ordered   SGPT (ALT): 19  (06/18/2009)   Alkaline phosphatase: 82  (06/18/2009)   Total bilirubin: 0.4  (06/18/2009)   Liver panel due: 07/17/2010    Lipid flowsheet reviewed?: Yes   Progress toward LDL goal: Unchanged    Stage of readiness to change (lipid management): Maintenance  Hypertension   Last Blood Pressure: 152 / 77  (01/16/2010)   Serum creatinine: 0.67  (06/18/2009)   BMP action: Not indicated   Serum potassium 3.6  (A999333)   Basic metabolic panel due: 0000000    Hypertension flowsheet reviewed?: Yes   Progress toward BP goal: Unchanged    Stage of readiness to change (hypertension management): Action  Self-Management Support :   Personal Goals (by the next clinic visit) :     Personal A1C goal: 7  (06/18/2009)     Personal blood pressure goal: 130/80  (06/18/2009)     Personal LDL goal: 100  (06/18/2009)    Patient will work on the following items until the next clinic visit to reach self-care goals:     Medications and monitoring: take my medicines every day, bring all of my medications to every visit, examine my feet every day  (01/16/2010)     Eating: drink diet soda or water instead of juice or soda, eat more vegetables, use fresh or frozen vegetables, eat foods that are low in salt, eat baked foods instead of fried foods, eat fruit for snacks and desserts  (01/16/2010)     Activity: take a 30 minute walk every day  (01/16/2010)    Diabetes self-management support: Copy of home glucose meter record, Written self-care plan  (01/16/2010)   Diabetes care plan printed   Last diabetes  self-management training by diabetes educator: 07/07/2007   Last medical nutrition therapy: 06/24/2006    Hypertension self-management support: Written self-care plan  (01/16/2010)   Hypertension self-care plan printed.    Lipid self-management support: Written self-care plan  (01/16/2010)   Lipid self-care plan printed.   Nursing Instructions: Diabetic foot exam today Give Flu vaccine today Schedule screening mammogram (  see order) Refer for screening diabetic eye exam (see order) Give tetanus booster today Give Pneumovax today   Process Orders Check Orders Results:     [facility]: ABN not required for this insurance Tests Sent for requisitioning (January 16, 2010 11:47 AM):     01/16/2010: [facility] -- T-Urine Microalbumin w/creat. ratio [[id number]] (signed)

## 2010-02-07 NOTE — Letter (Signed)
Summary: TWO WEEK GLUCOSE  TWO WEEK GLUCOSE   Imported By: Garlan Fillers 01/23/2010 11:01:31  _____________________________________________________________________  External Attachment:    Type:   Image     Comment:   External Document

## 2010-02-07 NOTE — Progress Notes (Signed)
Summary: Med change  Phone Note Refill Request Message from:  Fax from Pharmacy on January 10, 2010 9:38 AM  Refills Requested: Medication #1:  PRAVASTATIN SODIUM 40 MG  TABS Take 1 tablet by mouth at bedtime [facility] can supply pt with Lipitor 10 mg daily at no charge.  Will you consider a change in Statins.   Method Requested: Electronic Initial call taken by: Sander Nephew RN,  January 10, 2010 9:39 AM  Follow-up for Phone Call        completed refill, thank you Iskra  Follow-up by: Trinidad Curet MD,  January 11, 2010 9:04 AM    New/Updated Medications: LIPITOR 40 MG TABS (ATORVASTATIN CALCIUM) Take 1 tab by mouth at bedtime Prescriptions: LIPITOR 40 MG TABS (ATORVASTATIN CALCIUM) Take 1 tab by mouth at bedtime  #30 x 5   Entered and Authorized by:   Trinidad Curet MD   Signed by:   Trinidad Curet MD on 01/11/2010   Method used:   Faxed to ...       [facility] (retail)       [address] [place], [place]  [address]       Ph: [phone]       Fax: [phone]   RxID:   [id number]

## 2010-02-07 NOTE — Assessment & Plan Note (Signed)
Summary: diabetes f/up  Assessment:Patient kept 3 day food record with blood sugars before & after meals. Her meals tend to be higher in fat and low in carbs 20-40 grams carb. Eats out often. Still working on misconceptions regarding how foods affect blood sugar. She has concrete thinking in reagrds to diabetes meal planning which causes her to feel "less than" when she does not abide by the "rules". Not sleeping well- doesn;t like tkaing ambien- afraid she'll get addicted Wt:has lost 1 # since last visit  Medicationss: lantus- 42 units and currenlty out of Victoza- never increased her dose Exercise- getting very little,  Diagnosis:  NB 1.1-nutrition and food related knowledge stil needs assistance/encouragement and reveiw.  NB 1.4-self monitoirng deficit improving.  Intervention:  1-Education and reinforced couseling on healthier food choices.Reviewed ehalthier fast food choices nad helper her set limits on fat calories to  ~ 30%. 2- Coordination of care with [facility] regariding her getting Victoza and startig higher dose.  4-Coordination of care with physican today regarding patient medication/goals.  Monitoring:understanding of how food choices affect weight and blood sugar and purpose of self monitoring Evaluation: blood sugar, weight and A1C  Follow-up:2-4 weeks same day as doctor appoitnment      Allergies: 1)  ! Doxycycline   Complete Medication List: 1)  Norvasc 10 Mg Tabs (Amlodipine besylate) .... Take 1 tablet by mouth once a day 2)  Lipitor 40 Mg Tabs (Atorvastatin calcium) .... Take 1 tab by mouth at bedtime 3)  Lisinopril-hydrochlorothiazide 20-12.5 Mg Tabs (Lisinopril-hydrochlorothiazide) .... Take one tablet twice daily 4)  Ambien 5 Mg Tabs (Zolpidem tartrate) .... Take 1 tab by mouth at bedtime 5)  Betimol 0.5 % Soln (Timolol) .... One drop in each eye every morning 6)  Lantus 100 Unit/ml Soln (Insulin glargine) .... Inject 40 units under the skin  in the morning time, every morning, come back to clinic in 2 weeks for further directions 7)  Victoza 18 Mg/28ml Soln (Liraglutide) .... Inject 1.2 units under the skin once daily  Other Orders: DSMT(Medicare) Individual, 30 Minutes [id number])   Orders Added: 1)  DSMT(Medicare) Individual, 30 Minutes [id number]

## 2010-02-07 NOTE — Assessment & Plan Note (Signed)
Summary: diabetes  MEDICAL NUTRITION THERAPY  Assessment:Patient trying to eat healthier and still has many misconceptions about how different foods affect blood sugar and are considered healthy. Not checking blood sugar with and goal or purpose nad finds self monitoirng to be depressing and frustrating because she does not see improvement. Verbalizes that she knows keeping food records/diary could be helpful to weight loss nad seeing how food affects blood sugar, however has not followed through on this behavior.  Wt:has lost 3.6# on since on Victoza  Medicationss: lantus and Victoza 24 hour recall: raisin bran and 2%  milk, chicken on [employer] grill Exercise: Labs:A1C increased Estimated needs: 1200-1550 calories, 50 grams fat, 150-160 g carb, 80 grams protein/day    Diagnosis:  NB 1.1-nutrtion and food related knowledge deifict as related to lack of prior exposure as evidenced by patient report and use of higher fat foods nad resricting some fruits.  NB 1.4-self monitoirng deficit as related to seeing no improvement in blood sugars and lack of understanding of how to decrease blood sugar as evidenced by patient report of misconceptions fo how foods affect blood sugar.   Intervention:  1-Education and couseling on healthier food choices and DASH diet 2- Education and counseling on 360 view 3 day intensive self monitoring to see trends/patterns 3- Provided patient with recording sheet for food and blood glucose for 3 days prior to next appointment 4-Coordination of care with physican today regarding patient medication/goals.  Monitoring:understanding of how food choices affect weight and blood sugar and purpose of self monitoring Evaluation: blood sugar, weight and A1C  Follow-up:2 weeks same day as doctor appoitnment  Allergies: 1)  ! Doxycycline   Complete Medication List: 1)  Norvasc 10 Mg Tabs (Amlodipine besylate) .... Take 1 tablet by mouth once a day 2)  Pravastatin  Sodium 40 Mg Tabs (Pravastatin sodium) .... Take 1 tablet by mouth at bedtime 3)  Lisinopril-hydrochlorothiazide 20-12.5 Mg Tabs (Lisinopril-hydrochlorothiazide) .... Take one tablet twice daily 4)  Ambien 5 Mg Tabs (Zolpidem tartrate) .... Take 1 tab by mouth at bedtime 5)  Betimol 0.5 % Soln (Timolol) .... One drop in each eye every morning 6)  Lantus 100 Unit/ml Soln (Insulin glargine) .... Inject 40 units under the skin in the morning time, every morning, come back to clinic in 2 weeks for further directions 7)  Victoza 18 Mg/27ml Soln (Liraglutide) .... Inject 1.2 units under the skin once daily  Other Orders: MNT/Initial Visit and Intervention, 15 minutes [id number])   Orders Added: 1)  MNT/Initial Visit and Intervention, 15 minutes [id number]

## 2010-02-11 ENCOUNTER — Telehealth: Payer: Self-pay | Admitting: *Deleted

## 2010-02-11 NOTE — Telephone Encounter (Signed)
Request is for accupril 40 mg 1 daily   I see lisinopril-hctz in centricity, but this is from health dept.  Do you want to change? Last given 11/23/09

## 2010-02-12 MED ORDER — QUINAPRIL HCL 40 MG PO TABS: 40.0000 mg | ORAL_TABLET | Freq: Every day | ORAL | Status: DC

## 2010-02-12 NOTE — Telephone Encounter (Signed)
Discontinue HCTZ-Lisinopril, Accupril was started.

## 2010-02-12 NOTE — Telephone Encounter (Signed)
Rx faxed

## 2010-02-28 ENCOUNTER — Encounter: Payer: Self-pay | Admitting: Internal Medicine

## 2010-03-13 ENCOUNTER — Ambulatory Visit (INDEPENDENT_AMBULATORY_CARE_PROVIDER_SITE_OTHER): Payer: Self-pay | Admitting: Internal Medicine

## 2010-03-13 ENCOUNTER — Encounter: Payer: Self-pay | Admitting: *Deleted

## 2010-03-13 ENCOUNTER — Encounter: Payer: Self-pay | Admitting: Internal Medicine

## 2010-03-13 VITALS — BP 147/85 | HR 73 | Temp 97.7°F | Ht 60.0 in | Wt 168.4 lb

## 2010-03-13 DIAGNOSIS — R109 Unspecified abdominal pain: Secondary | ICD-10-CM

## 2010-03-13 DIAGNOSIS — R1032 Left lower quadrant pain: Secondary | ICD-10-CM | POA: Insufficient documentation

## 2010-03-13 DIAGNOSIS — R1031 Right lower quadrant pain: Secondary | ICD-10-CM

## 2010-03-13 DIAGNOSIS — I10 Essential (primary) hypertension: Secondary | ICD-10-CM

## 2010-03-13 MED ORDER — METOPROLOL TARTRATE 25 MG PO TABS: 25.0000 mg | ORAL_TABLET | Freq: Two times a day (BID) | ORAL | Status: DC

## 2010-03-13 NOTE — Patient Instructions (Addendum)
1) Please follow-up at the clinic in 1 month, at which time we will reevaluate your abdominal pain and blood pressure.Marland Kitchen 2) You have been started on Metoprolol for your blood pressure, if you develop throat closing, tongue swelling, rash, please stop the medication and call the clinic at [phone] and go to the ER. 3) Please bring all of your medications in a bag to your next visit. 4) STOP taking Amlodipine. 5) Increase fiber intake (directions below) in your diet.    High-Fiber Diet A high-fiber diet changes your normal diet to include more whole grains, legumes, fruits, and vegetables. Changes in the diet involve replacing refined carbohydrates with unrefined foods. The calorie level of the diet is essentially unchanged. The Dietary Reference Intake (recommended amount) for adult males is 38 grams per day. For adult females, it is 25 grams per day. Pregnant and lactating women should consume 28 grams of fiber per day. Fiber is the intact part of a plant that is not broken down during digestion. Functional fiber is fiber that has been isolated from the plant to provide a beneficial effect in the body. PURPOSE  Increase stool bulk.   Ease and regulate bowel movements.   Lower cholesterol.  INDICATIONS THAT YOU NEED MORE FIBER  Constipation and hemorrhoids.   Uncomplicated diverticulosis (intestine condition) and irritable bowel syndrome.   Weight management.   As a protective measure against hardening of the arteries (atherosclerosis), diabetes, and cancer.  NOTE OF CAUTION If you have a digestive or bowel problem, ask your caregiver for advice before adding high-fiber foods to your diet. Some of the following medical problems are such that a high-fiber diet should not be used without consulting your caregiver. DO NOT USE WITH:  Acute diverticulitis (intestine infection).   Partial small bowel obstructions.   Complicated diverticular disease involving bleeding, rupture  (perforation), or abscess (boil, furuncle).   Presence of autonomic neuropathy (nerve damage) or gastric paresis (stomach cannot empty itself).  GUIDELINES FOR INCREASING FIBER IN THE DIET  Start adding fiber to the diet slowly. A gradual increase of about 5 more grams (2 slices of whole-wheat bread, 2 servings of most fruits or vegetables, or 1 bowl of high-fiber cereal) per day is best. Too rapid an increase in fiber may result in constipation, flatulence, and bloating.   Drink enough water and fluids to keep your urine clear or pale yellow. Water, juice, or caffeine-free drinks are recommended. Not drinking enough fluid may cause constipation.   Eat a variety of high-fiber foods rather than one type of fiber.   Try to increase your intake of fiber through using high-fiber foods rather than fiber pills or supplements that contain small amounts of fiber.   The goal is to change the types of food eaten. Do not supplement your present diet with high-fiber foods, but replace foods in your present diet.  INCLUDE A VARIETY OF FIBER SOURCES  Replace refined and processed grains with whole grains, canned fruits with fresh fruits, and incorporate other fiber sources. White rice, white breads, and most bakery goods contain little or no fiber.   Brown whole-grain rice, buckwheat oats, and many fruits and vegetables are all good sources of fiber. These include: broccoli, Brussels sprouts, cabbage, cauliflower, beets, sweet potatoes, white potatoes (skin on), carrots, tomatoes, eggplant, squash, berries, fresh fruits, and dried fruits.   Cereals appear to be the richest source of fiber. Cereal fiber is found in whole grains and bran. Bran is the fiber-rich outer coat of cereal  grain, which is largely removed in refining. In whole-grain cereals, the bran remains. In breakfast cereals, the largest amount of fiber is found in those with "bran" in their names. The fiber content is sometimes indicated on the  label.   You may need to include additional fruits and vegetables each day.   In baking, for 1 cup white flour, you may use the following substitutions:   1 cup whole-wheat flour minus 2 tablespoons.   1/2 cup white flour plus 1/2 cup whole-wheat flour.  References: Dietary Reference Intakes: Recommended Intakes for Individuals. [employer]. Institute of Medicine. Food and Nutrition Board. Document Released: 12/23/2004 Document Re-Released: 03/19/2009 [facility] Patient Information 2011 [place].

## 2010-03-13 NOTE — Progress Notes (Signed)
Pt walked into clinic stating something is going on in abd since Feb.  Past 2 weeks abd hurting and radiates to body.   Rates pain 9/10 but today she is not having pain Dizziness comes and goes but is getting worse. She does not take antacids   Will see today

## 2010-03-13 NOTE — Progress Notes (Signed)
  Subjective:    Patient ID: Ann Bryan, female    DOB: October 21, 1960, 50 y.o.   MRN: [id number]  HPI Patient is a 50yo female with a PMHX of uncontrolled DMII, HLD, HTN who presents to the clinic for the following:  1) Abdominal pain - patient describes intermittent, BL, achy abdominal pain ongoing x2 weeks.  Rated 10 out of 10 at its worst, currently 0/10 in severity.  No specific alleviating or aggravating factors. No food association, no recent new foods or sick contacts.No associated nausea, vomiting. No dysuria, hematuria, malodorous urine, change in frequency of urine. Patient does confirm  Constipation with increase straining of stools. Current frequency stools as every other day from prior daily stools. No fevers, chills, diarrhea, no new foods, no new meds, no nausea, vomiting.   2) Hypertension - patient currently checking blood pressures regularly. She denies current chest pain, palpitations, headaches, vision changes, dizziness. Taking medications regularly.   Review of Systems Per HPI    Objective:   Physical Exam General: Vital signs reviewed and noted. Well-developed,well-nourished,in no acute distress; alert,appropriate and cooperative throughout examination. Head: normocephalic, atraumatic. Neck: No deformities, masses, or tenderness noted. Lungs: Normal respiratory effort. Clear to auscultation BL without crackles or wheezes.  Heart: RRR. S1 and S2 normal without gallop, murmur, or rubs.  Abdomen: BS normoactive. Soft, Nondistended, non-tender.  No masses or organomegaly. No rebound tenderness. Negative Murphy sign. Extremities: No pretibial edema.         Assessment & Plan:

## 2010-03-13 NOTE — Assessment & Plan Note (Addendum)
Likely secondary to constipation, as patient how with less frequent bowel movements, and with increased straining. Asymptomatic of urinary symptoms, denies fevers, chills suggestive of appendicitis. Denies colicky pain, food association, therefore gallbladder source less likely. Without nausea, vomiting, upper abd symptoms, therefore although poorly controlled diabetic, gastroparesis less likely. Of note, patient has not been sexually active x 3 years, and has had a hysterectomy, therefore pregnancy not a consideration. - Handout provided for increased fiber consumption.  - Will change Amlodipine, as it may be contributing to constipation, and blood pressure are not well controlled on current regimen.

## 2010-03-13 NOTE — Assessment & Plan Note (Signed)
Blood pressure remain elevated, over past several visits. Now also with constipation. - Will d/c Amlodipine - Will start Metoprolol. - Recheck within 3-4 weeks.

## 2010-03-18 LAB — GLUCOSE, CAPILLARY: Glucose-Capillary: 194 mg/dL — ABNORMAL HIGH (ref 70–99)

## 2010-03-25 LAB — GLUCOSE, CAPILLARY: Glucose-Capillary: 242 mg/dL — ABNORMAL HIGH (ref 70–99)

## 2010-04-16 LAB — GLUCOSE, CAPILLARY: Glucose-Capillary: 101 mg/dL — ABNORMAL HIGH (ref 70–99)

## 2010-04-18 LAB — GLUCOSE, CAPILLARY: Glucose-Capillary: 155 mg/dL — ABNORMAL HIGH (ref 70–99)

## 2010-05-24 NOTE — Op Note (Signed)
NAMEARMATHA, KARGE                            ACCOUNT NO.:  [id number]   MEDICAL RECORD NO.:  [id number]                   PATIENT TYPE:  INP   LOCATION:  9309                                 FACILITY:  [facility]   PHYSICIAN:  Phil D. Kalman Shan, M.D.                  DATE OF BIRTH:  1960-07-21   DATE OF PROCEDURE:  12/19/2002  DATE OF DISCHARGE:                                 OPERATIVE REPORT   PREOPERATIVE DIAGNOSIS:  Symptomatic fibroids.   POSTOPERATIVE DIAGNOSES:  1. Symptomatic fibroids.  2. Pelvic adhesions.   PROCEDURE:  Total abdominal hysterectomy and lysis of pelvic adhesions, with  left salpingo-oophorectomy.   SURGEON:  Franchot Heidelberg. Kalman Shan, M.D.   FIRST ASSISTANT:  Guss Bunde, M.D.   ANESTHESIA:  General.   ESTIMATED BLOOD LOSS:  500 mL.   Specimen sent for pathologic diagnosis was uterus, left tube and ovary.   The procedure went as follows:   OPERATIVE FINDINGS:  Entering the abdominal cavity, there were some omental  adhesions from the anterior abdominal wall to the anterior surface and  superior surface of the uterus as well as some in the area of the bladder  flap.  There were also multiple adhesions in the cul-de-sac, both the  posterior peritoneum and bowel, and the uterus was irregular with many  morcellations of intramural leiomyomata and several small pedunculated  leiomyomata on the surface.  The left ovary was plastered against the left  wall of the uterus and was enveloped in adhesions with the tube; therefore,  we decided to sacrifice that ovary rather than risk further adhesions and  problems with cystic formation in the future as the right ovary seemed  completely normal.   The procedure went as follows:  Under satisfactory general anesthesia, the  patient in dorsal supine position, the vagina had been prepped with a Foley  catheter.  The abdomen was prepped and draped in the usual sterile manner  and entered through a previous vertical anterior  abdominal incision starting  at the symphysis and extending up just below the umbilicus, and a hockey  stick around the left side of the umbilicus.  The abdomen was entered by  layers.  On entering the peritoneal cavity, above findings were noted and  the adhesions were broken up mainly by sharp dissection, a few by blunt  dissection, thus freeing up the uterus as much as possible so that the round  ligaments could be isolated, ligated, divided, and openings made in  avascular portions of broad ligament, through which a free tie was placed  and tied around the infundibulopelvic ligament on the left and around the  utero-ovarian on the right side.  Clamps were then placed medial to the  aforementioned ties.  Tissue media was divided, thus separating the right  ovary and tube from the uterus.  The anterior leaf of the broad ligament was  then opened from the right side to the left, extending around the anterior  superior portion of cervix, forming a nice bladder flap, which was pushed  away from the anterior surface of the cervix.  The uterine vessels were then  skeletonized, doubly clamped, divided, and doubly ligated with 1 chromic  catgut suture ligature.  The cardinal ligaments were then clamped, divided,  and ligated with 1 chromic catgut suture ligature.  Additional adhesions  were then freed up from the posterior cul-de-sac to the posterior wall of  the uterus by sharp dissection from the cervix and removed from the  operative site.  Second bite on the cardinal ligaments on both sides.  Each  side was done with the curved Heaney clamp, divided, and ligated with 1  chromic catgut suture ligature, and the cervix was then dissected away from  the apex of the vagina, which was closed with two figure-of-eight 1 chromic  catgut sutures.  The area observed for bleeding and none was noted in the  pelvis; however, there seemed to be bleeding over in the area of the right  tube and ovary and  after some investigation, we found that there was some  bleeding from the ovary, which was controlled with figure-of-eight 3-0  chromic catgut sutures.  There was also then bleeding into the cul-de-sac  from the bladder flap edge, which was controlled with hot cautery.  A 1 PDS  suture with a loop was used for fascia closure and skin edges were  approximated with skin staples after subcutaneous bleeders were controlled  with hot cautery.  A dry sterile dressing was applied.  The patient  tolerated the procedure well.  Tape, instrument, sponge, and needle count  reported correct before closure at the end of the procedure, and the Foley  catheter was draining clear amber urine.                                               Phil D. Kalman Shan, M.D.    PDR/MEDQ  D:  12/19/2002  T:  12/19/2002  Job:  [id number]

## 2010-05-24 NOTE — H&P (Signed)
Ann Bryan, Ann Bryan                            ACCOUNT NO.:  [id number]   MEDICAL RECORD NO.:  [id number]                   PATIENT TYPE:  INP   LOCATION:  NA                                   FACILITY:  [facility]   PHYSICIAN:  Phil D. Kalman Shan, M.D.                  DATE OF BIRTH:  1960-10-26   DATE OF ADMISSION:  DATE OF DISCHARGE:                                HISTORY & PHYSICAL   DATE OF ADMISSION:  December 19, 2002   CHIEF COMPLAINT:  Growing abdomen.   PRESENT ILLNESS:  The patient is a 50 year old nulligravida black female who  first came in August into the MAU with heavy vaginal bleeding and a uterus  extending above the umbilicus.  The patient had a previous myomectomy in  1994 and was seen several times in the GYN clinic.  Finally, in November we  were able to obtain some Lupron Depot for the patient which we gave her in  an attempt to shrink down the uterus.  Bleeding was controlled up to that  point with Depo-Provera.   PAST MEDICAL HISTORY:  The patient includes a myomectomy in 1994, 10 years  an insulin-dependent diabetic on Actos and insulin 70/30.  Also, medications  for hypertension:  Lotrel, Maxzide, and clonidine.   FAMILY HISTORY:  Includes grandmother, mother, aunt, and sister with  diabetes.  Grandmother who died of a heart attack.  Grandmother, mother,  sister, brother, and aunt all have high blood pressure.   SOCIAL HISTORY:  Lives with her mother and does not work.  Does not smoke,  does not drink, does not use IV or illicit drugs, has never been addicted to  drugs.   SYSTEMIC REVIEW:  Besides the present illness includes chronic fatigue, some  problem with her vision, complains of recurrent vaginal odor.   PHYSICAL EXAMINATION:  VITAL SIGNS:  The patient is 175 pounds, was 5 feet  tall.  Blood pressure is 182/80, pulse of 80 per minute, respirations of 18  per minute, and temperature of 98.6.  ALLERGIES:  None known.  GENERAL:  Well-developed,  well-nourished, nulligravida, slightly obese,  black female in no acute distress.  HEENT:  PERRLA, within normal limits.  NECK:  Supple.  Thyroid is symmetrical and normal with no masses.  LUNGS:  Clear to auscultation and percussion.  HEART:  No murmur, normal sinus rhythm.  Point of maximal impulse at the  fifth intercostal space in midclavicular line.  BREASTS:  Symmetrical with no nipple discharge and no dominant masses.  ABDOMEN:  Soft with a palpable mass up to the umbilicus that seems to be  freely movable but nontender.  No rebound, no guarding, and no other  abdominal tenderness.  No inguinal hernias noted.  BACK:  Erect.  No CVA tenderness.  PELVIC:  External genitalia was normal.  Introitus was marital.  BUS within  normal limits.  The vagina was clean and well rugated.  The cervix is  nulliparous and clean.  The uterus was enlarged as aforementioned but the  adnexa could not be palpated.  RECTAL:  Negative, with no masses noted.  EXTREMITIES:  Show no edemas, no varices.  DTRs within normal limits.  LYMPH:  No adenopathy noted.  SKIN:  Normal turgor.   IMPRESSION:  1. Large pelvic leiomyomata uteri.  2. Insulin-dependent diabetes mellitus.  3. Chronic essential hypertension.   PLAN:  Total abdominal hysterectomy.  The patient given prescriptions for  bowel prep to be done on the two days prior to surgery because of her  previous myomectomy.  We have also discussed oophorectomy with the patient  and unless the ovaries are diseased our plan is to leave the ovaries if  possible.                                               Phil D. Kalman Shan, M.D.    PDR/MEDQ  D:  12/17/2002  T:  12/17/2002  Job:  [id number]

## 2010-05-24 NOTE — Discharge Summary (Signed)
Ann Bryan, Ann Bryan                            ACCOUNT NO.:  [id number]   MEDICAL RECORD NO.:  [id number]                   PATIENT TYPE:  INP   LOCATION:  9309                                 FACILITY:  [facility]   PHYSICIAN:  Phil D. Kalman Shan, M.D.                  DATE OF BIRTH:  05-16-1960   DATE OF ADMISSION:  12/19/2002  DATE OF DISCHARGE:  12/22/2002                                 DISCHARGE SUMMARY   HISTORY OF PRESENT ILLNESS:  The patient is a 50 year old black female who  was admitted with symptomatic leiomyoma uteri and underwent total abdominal  hysterectomy and left salpingo-oophorectomy on the day of admission.  Her  hospital course since then has been remarkable only that her insulin-  dependent diabetes has been somewhat difficult to control and the patient is  being referred back to the medical clinic at [facility] for tighter control  on that.  We did increase her insulin slightly from admission.  She was on  Humulin 70/30 and we increased her morning to 20 units and her evening to 18  units.  She is also on Metformin h.s.  Her other problem, her blood pressure  has been under good control here on her usual medications.  The patient has  progressed nicely postoperatively and in spite of all the adhesions that she  had during her surgery, she had started passing gas fairly quickly after  delivery and has not had any problem with incision healing up to this point  nor abdominal distention.  Her other hospital significant findings since her  admission is that she had a Tmax on the 15th, i.e., day prior to discharge  of 100.4, but otherwise has been completely afebrile during her entire  hospital stay.   LABORATORIES:  Significant laboratories was her variation in blood sugars,  not surprising as her random sugar prior to admission was 220 since her  admission on the day prior to discharge.  She had a high of 168 with a low  129 and a fasting of 129.  Her hemoglobin was  stabilized at 10.3 with  hematocrit of 29.7 and the rest of her laboratory findings were relatively  within normal limits.   PHYSICAL EXAMINATION:  HEENT:  Normal.  LUNGS:  Clear to auscultation/percussion.  NECK:  Supple with no masses.  HEART:  No murmurs and normal sinus rhythm.  ABDOMEN:  Soft, flat.  No distention.  Good bowel sounds.  Midline incision  healing well with no induration or fluctuation.  PELVIC:  No significant genital bleeding.  EXTREMITIES:  Negative with a negative Homan's sign and no discomfort.  BACK:  No CVA tenderness.   DISCHARGE MEDICATIONS:  1. The patient is being discharged on Hemocyte F once a day for iron     supplementation.  2. Motrin on a regular basis at 800 mg daily.  3. To control  pain with Percocet supplementation.  40 pills of each of     those.   FOLLOWUP:  She is to be followed up on the 20th in MAU for staple removal,  in two weeks for incision check, and in four weeks for discharge check-up.  She has been given specific instructions as to activity and follow-up with  the emphasis on heavy lifting and stairs and also restriction on driving  until she is comfortable and can slam on the brake without thinking about  her belly.   IMPRESSION:  Satisfactory status post total abdominal hysterectomy and left  salpingo-oophorectomy and secondary postoperative anemia.                                               Phil D. Kalman Shan, M.D.    PDR/MEDQ  D:  12/22/2002  T:  12/22/2002  Job:  [id number]

## 2010-05-24 NOTE — Group Therapy Note (Signed)
Ann Bryan, Ann Bryan NO.:  [id number]   MEDICAL RECORD NO.:  [id number]          PATIENT TYPE:  WOC   LOCATION:  [facility]                   FACILITY:  [facility]   PHYSICIAN:  Willey Blade, MD DATE OF BIRTH:  12/23/1960   DATE OF SERVICE:                                    CLINIC NOTE   REASON FOR CONSULTATION:  Dyspareunia and bleeding.   HISTORY OF PRESENT ILLNESS:  This patient is a 50 year old African American  female who presents with dyspareunia and vaginal bleeding during  intercourse.  The patient states that this is the second time she has had  sex and had been a virgin prior to this time.  Most of her discomfort is  localized to the introitus with spotting.  The patient has had a total  abdominal hysterectomy and left salpingo-oophorectomy in the past for  leiomyoma.   PHYSICAL EXAMINATION:  EXTERNAL GENITALIA:  Vulva and vagina normal.  Speculum exam reveals no evidence of granulation tissue and/or abnormalities  of the cuff.  Both adnexa are palpable and  found to be normal.   IMPRESSION:  Dyspareunia at the introitus and bleeding.   PLAN:  I explained to the patient that more than likely a combination of  dryness along with vaginismus contributed to her discomfort.  I recommended  the use of Astroglide to be used liberally during intercourse, in addition  to relaxation technique of the adductors of her thigh.  The patient will  contact us if any further problems develop           ______________________________  Willey Blade, MD     SHB/MEDQ  D:  08/15/2005  T:  08/15/2005  Job:  [id number]

## 2010-05-24 NOTE — Group Therapy Note (Signed)
   NAME:  Ann Bryan, Ann Bryan                            ACCOUNT NO.:  [id number]   MEDICAL RECORD NO.:  [id number]                   PATIENT TYPE:  OUT   LOCATION:  [facility]                           FACILITY:  [facility]   PHYSICIAN:  Andrew Au, MD                     DATE OF BIRTH:  05/22/1960   DATE OF SERVICE:  09/15/2002                                    CLINIC NOTE   HISTORY OF PRESENT ILLNESS:  This patient is a 50 year old nulligravida  black female who was in the MAU on August 12 with heavy vaginal bleeding and  the uterus extended above the umbilicus.  The patient's history includes a  myomectomy in 1994.  Has been 10 years an insulin-dependent diabetic on  Actos and insulin 70/30.  Is also on three medications for hypertension,  Lotrel, Maxzide, and clonidine.  She weighs 175 pounds.  Blood pressure  182/82, pulse 88.  Height 5 feet.  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  Has recently been  given Depo-Provera which is for control of the bleeding.  What we discussed  with the patient is total abdominal hysterectomy and we would like to shrink  that uterus down with Depo-Lupron which we will request through the special  program as she has no insurance at this time and then we would like to  schedule her for surgery in the latter part of December when she is out of  school.   IMPRESSION:  Symptomatic leiomyomata with secondary anemia.  In October her  hemoglobin was 10.3 with 31.1 hematocrit.  She is on iron therapy.                                               Andrew Au, MD    PR/MEDQ  D:  09/15/2002  T:  09/15/2002  Job:  [id number]

## 2010-06-05 ENCOUNTER — Other Ambulatory Visit: Payer: Self-pay | Admitting: Internal Medicine

## 2010-06-05 ENCOUNTER — Other Ambulatory Visit: Payer: Self-pay | Admitting: *Deleted

## 2010-06-05 ENCOUNTER — Encounter: Payer: Self-pay | Admitting: *Deleted

## 2010-06-05 MED ORDER — INSULIN GLARGINE 100 UNIT/ML ~~LOC~~ SOLN: 40.0000 [IU] | Freq: Every day | SUBCUTANEOUS | Status: DC

## 2010-06-05 NOTE — Telephone Encounter (Signed)
Lantus Pens were ordered per Dr Jobe Igo, 2 boxes a month Order was put in as vials, but I called in the pens

## 2010-06-05 NOTE — Progress Notes (Signed)
Lantus insulin pens  called into [facility]  2 boxes a month with 12 refills.

## 2010-06-05 NOTE — Telephone Encounter (Signed)
Pt is out of insulin, goes to [facility] Please refill. Pt would like to change to pens.

## 2010-07-25 ENCOUNTER — Other Ambulatory Visit: Payer: Self-pay | Admitting: *Deleted

## 2010-07-25 DIAGNOSIS — I10 Essential (primary) hypertension: Secondary | ICD-10-CM

## 2010-07-26 MED ORDER — METOPROLOL TARTRATE 25 MG PO TABS: 25.0000 mg | ORAL_TABLET | Freq: Two times a day (BID) | ORAL | Status: DC

## 2010-07-26 NOTE — Telephone Encounter (Signed)
Called to pharm 

## 2010-08-13 ENCOUNTER — Other Ambulatory Visit: Payer: Self-pay | Admitting: *Deleted

## 2010-08-14 MED ORDER — ZOLPIDEM TARTRATE 5 MG PO TABS: 5.0000 mg | ORAL_TABLET | Freq: Every evening | ORAL | Status: DC | PRN

## 2010-08-14 NOTE — Telephone Encounter (Signed)
Ambien rx called to [facility].

## 2010-08-21 ENCOUNTER — Other Ambulatory Visit: Payer: Self-pay | Admitting: *Deleted

## 2010-08-21 MED ORDER — ATORVASTATIN CALCIUM 40 MG PO TABS: 40.0000 mg | ORAL_TABLET | Freq: Every day | ORAL | Status: DC

## 2010-08-21 NOTE — Telephone Encounter (Signed)
Pravastatin is also in pt's med list??

## 2010-08-21 NOTE — Telephone Encounter (Signed)
Refill faxed in.  

## 2010-09-06 ENCOUNTER — Telehealth: Payer: Self-pay | Admitting: *Deleted

## 2010-09-06 MED ORDER — METOPROLOL SUCCINATE ER 25 MG PO TB24: 25.0000 mg | ORAL_TABLET | Freq: Every day | ORAL | Status: DC

## 2010-09-06 NOTE — Telephone Encounter (Signed)
[facility] can get the toprol xl 25mg  for free for pt, could we change this? If so, please change med list  Thanks,helen

## 2010-09-12 ENCOUNTER — Encounter: Payer: Self-pay | Admitting: Internal Medicine

## 2010-09-12 ENCOUNTER — Ambulatory Visit (INDEPENDENT_AMBULATORY_CARE_PROVIDER_SITE_OTHER): Payer: Self-pay | Admitting: Internal Medicine

## 2010-09-12 VITALS — BP 195/94 | HR 76 | Temp 98.2°F | Ht 60.0 in | Wt 170.7 lb

## 2010-09-12 DIAGNOSIS — I10 Essential (primary) hypertension: Secondary | ICD-10-CM

## 2010-09-12 DIAGNOSIS — E119 Type 2 diabetes mellitus without complications: Secondary | ICD-10-CM

## 2010-09-12 LAB — POCT GLYCOSYLATED HEMOGLOBIN (HGB A1C): Hemoglobin A1C: 12.5

## 2010-09-12 MED ORDER — LISINOPRIL-HYDROCHLOROTHIAZIDE 20-25 MG PO TABS: 1.0000 | ORAL_TABLET | Freq: Every day | ORAL | Status: DC

## 2010-09-12 NOTE — Assessment & Plan Note (Signed)
She has not been seen in the clinic since March 2012. Her last HbA1c in December 2011 was 10.5. She says that her sugars are running high at home and so she expects her hemoglobin A1c would be higher than this today. She is on Lantus 40 units at bedtime and had the prescription for it. She was tried on metformin before but had GI disturbance with that and so stopped. The hemoglobin A1c results pending before letting her go home and so explain her that I would give her a call with changing dose of Lantus as needed. Also would make an appointment with Barnabas Harries during the next visit in 2 weeks with me.

## 2010-09-12 NOTE — Assessment & Plan Note (Signed)
Blood pressure 195/94 today- not being that high ever during previous clinic visits. Apparently there was some misunderstanding in some part and so she was taking just Metroprolol for her blood pressure, while she was supposed to take Prinzide along with that too. I talked with her and at length about this as in history of present illness, and gave prescription of Prinzide which she said she would get refilled as soon as possible. I will check a BMP today for potassium and creatinine levels and recheck in 2 weeks when she comes back for next visit. At that time we'll make appropriate changes in medications if felt needed.

## 2010-09-12 NOTE — Patient Instructions (Signed)
Please make an appointment with Dr. Posey Pronto in 34- 16 days. Please try to make an appointment with Barnabas Harries during the same visit. Please start taking your blood pressure pill Prinzide as soon as possible. Next time we will check your lab test, BP and sugars and make appropriate changes.

## 2010-09-12 NOTE — Progress Notes (Signed)
  Subjective:    Patient ID: Ann Bryan, female    DOB: 1960/03/24, 50 y.o.   MRN: [id number]  HPI Ms. Casiano is a pleasant 50 year woman with past with history of DM 2 with last HbA1c of 10.5 in December 2011, hypertension who comes to the clinic for regular followup visit and medication refills. She was out of her blood pressure pill about 4 days before and she called the pharmacy and try to do the prescription for metoprolol filled and got it a day after. She is apparently supposed to be on Prinzide along with metoprolol, but she says that she is just on metoprolol and did not take Prinzide since last visit in March 2012. She says that she was thinking about why she is on one blood pressure because she was on 2  blood pressure pills all the time before, but she thought that this might be potent blood pressure pill which might control her blood pressure alone. Her sister also questioned her about why she had one pill rather than 2. Her blood pressure today is 195/94 and has been never this high looking into vitals for previous visits. Although she denies any chest pain, she breath, headache, vision changes, abdominal pain, nausea, vomiting, fever, chills, diarrhea, recent weight loss. She does complain of stress all the time in her life. She recently moved with her niece 2 weeks before as she lost the house she used to live in. She also does not have any source of income except she's working in [employer] 3 times a week which apparently is not enough for her. She's also taking online classes for course in cybercrime recently and has to pay for that to. She says that she has been depressed and stressed out all her life since she was about 50 years old- But also acknowledges that everyone has problems in life but she cannot handle them.   Review of Systems    as per history of present illness, all other systems reviewed and negative.   Objective:   Physical Exam Constitutional: Vital signs  reviewed.  Patient is a well-developed and well-nourished in no acute distress and cooperative with exam. Alert and oriented x3.  Head: Normocephalic and atraumatic Mouth: no erythema or exudates, MMM Eyes: PERRL, EOMI, conjunctivae normal, No scleral icterus.  Neck: Supple, Trachea midline normal ROM, No JVD, mass, thyromegaly, or carotid bruit present.  Cardiovascular: RRR, S1 normal, S2 normal, no MRG, pulses symmetric and intact bilaterally Pulmonary/Chest: CTAB, no wheezes, rales, or rhonchi Abdominal: Soft. Non-tender, non-distended, bowel sounds are normal, no masses, organomegaly, or guarding present.  GU: no CVA tenderness Musculoskeletal: No joint deformities, erythema, or stiffness, ROM full and no nontender Neurological: A&O x3, Strenght is normal and symmetric bilaterally, cranial nerve II-XII are grossly intact, no focal motor deficit, sensory intact to light touch bilaterally.          Assessment & Plan:

## 2010-09-13 LAB — BASIC METABOLIC PANEL WITH GFR
BUN: 13 mg/dL (ref 6–23)
Calcium: 9.4 mg/dL (ref 8.4–10.5)
GFR, Est African American: >60 mL/min (ref >60)
GFR, Est Non African American: >60 mL/min (ref >60)
Glucose, Bld: 260 mg/dL — ABNORMAL HIGH (ref 70–99)
Potassium: 4.6 mEq/L (ref 3.5–5.3)

## 2010-09-26 ENCOUNTER — Ambulatory Visit: Payer: Self-pay | Admitting: Internal Medicine

## 2010-09-26 ENCOUNTER — Ambulatory Visit: Payer: Self-pay | Admitting: Dietician

## 2010-10-02 ENCOUNTER — Encounter: Payer: Self-pay | Admitting: Dietician

## 2010-10-03 ENCOUNTER — Encounter: Payer: Self-pay | Admitting: Dietician

## 2010-10-03 ENCOUNTER — Encounter: Payer: Self-pay | Admitting: Internal Medicine

## 2010-10-03 ENCOUNTER — Ambulatory Visit (INDEPENDENT_AMBULATORY_CARE_PROVIDER_SITE_OTHER): Payer: Self-pay | Admitting: Internal Medicine

## 2010-10-03 ENCOUNTER — Ambulatory Visit: Payer: Self-pay | Admitting: Dietician

## 2010-10-03 ENCOUNTER — Other Ambulatory Visit: Payer: Self-pay | Admitting: Internal Medicine

## 2010-10-03 VITALS — BP 177/86 | HR 82 | Temp 97.0°F | Ht 60.0 in | Wt 169.7 lb

## 2010-10-03 DIAGNOSIS — F3289 Other specified depressive episodes: Secondary | ICD-10-CM

## 2010-10-03 DIAGNOSIS — F329 Major depressive disorder, single episode, unspecified: Secondary | ICD-10-CM

## 2010-10-03 DIAGNOSIS — I10 Essential (primary) hypertension: Secondary | ICD-10-CM

## 2010-10-03 DIAGNOSIS — E119 Type 2 diabetes mellitus without complications: Secondary | ICD-10-CM

## 2010-10-03 LAB — GLUCOSE, CAPILLARY: Glucose-Capillary: 76 mg/dL (ref 70–99)

## 2010-10-03 MED ORDER — INSULIN GLARGINE 100 UNIT/ML ~~LOC~~ SOLN: 40.0000 [IU] | Freq: Every day | SUBCUTANEOUS | Status: DC

## 2010-10-03 MED ORDER — METOPROLOL SUCCINATE ER 100 MG PO TB24: 100.0000 mg | ORAL_TABLET | Freq: Every day | ORAL | Status: DC

## 2010-10-03 MED ORDER — ZOLPIDEM TARTRATE 10 MG PO TABS: 10.0000 mg | ORAL_TABLET | Freq: Every evening | ORAL | Status: DC | PRN

## 2010-10-03 MED ORDER — INSULIN GLARGINE 100 UNIT/ML ~~LOC~~ SOLN: 50.0000 [IU] | Freq: Every day | SUBCUTANEOUS | Status: DC

## 2010-10-03 NOTE — Assessment & Plan Note (Signed)
Lab Results  Component Value Date   NA 140 09/12/2010   K 4.6 09/12/2010   CL 104 09/12/2010   CO2 26 09/12/2010   BUN 13 09/12/2010   CREATININE 0.83 09/12/2010   CREATININE 0.67 06/18/2009    BP Readings from Last 3 Encounters:  10/03/10 177/86  09/12/10 195/94  03/13/10 147/85    Assessment: Hypertension control:  moderately elevated  Progress toward goals:  improved Barriers to meeting goals:  no barriers identified  Plan: Hypertension treatment:  Increase the dose of Toprol XL to 100 mg daily. she was taking 25 mg twice a day. Her heart rate is in the 80s and so we can go up on beta blocker. Continue Prinzide one tablet daily. Repeat BMP today. Recheck blood pressure in 2 weeks and if blood pressure still high consider going up on lisinopril to 40 mg daily and splitting Prinzide to lisinopril and HCTZ.

## 2010-10-03 NOTE — Patient Instructions (Addendum)
Please make followup appointment in 10-14 days for blood pressure check. After that make an appointment in 2 months there is about first week of December- for HbA1c check and adjustment of dose of insulin. Please follow the recommendations of Barnabas Harries for diabetes management. Start taking Metroprolol 100 mg daily and keep taking one pill of Prinzide daily. Meanwhile if anything happens please call the clinic for an early appointment or go to the ER.

## 2010-10-03 NOTE — Progress Notes (Signed)
  Subjective:    Patient ID: Ann Bryan, female    DOB: Jun 17, 1960, 50 y.o.   MRN: [id number]  HPI Ann Bryan is a pleasant 50 year woman with past with history of uncontrolled DM 2, uncontrolled hypertension who comes to the clinic for followup visit for her blood pressure and diabetes management. I saw her on 09/12/2010 and her HbA1c was 12.5 and her blood pressure was severely elevated with systolic in [id number]. The dose of Lantus was increased to 50 units from 40 units daily. For her blood pressure-Prinzide 20/25 one tablet daily was started back-which supposedly was already on her list. Her blood pressure today is 177/86.  She does bring her diabetes meter today and the average reading is 234-with most readings in 200s to 300s. She is supposed to see Barnabas Harries today at 10:30-but Butch Penny is a meeting at the time of appointment with me and patient is to go to get an apartment today. Anyways she denies any fever, chills, nausea vomiting, abdominal pain, diarrhea, headache, chest pain, short of breath.  Review of Systems    as per history of present illness, all other systems reviewed and negative Objective:   Physical Exam  Constitutional: Vital signs reviewed.  Patient is a well-developed and well-nourished in no acute distress and cooperative with exam. Alert and oriented x3.  Head: Normocephalic and atraumatic Mouth: no erythema or exudates, MMM Eyes: PERRL, EOMI, conjunctivae normal, No scleral icterus.  Neck: Supple, Trachea midline normal ROM, No JVD Cardiovascular: RRR, S1 normal, S2 normal, no MRG, pulses symmetric and intact bilaterally Pulmonary/Chest: CTAB, no wheezes, rales, or rhonchi Abdominal: Soft. Non-tender, non-distended, bowel sounds are normal Musculoskeletal: No joint deformities, erythema, or stiffness, ROM full and no nontender Neurological: A&O x3, Strenght is normal and symmetric bilaterally  Skin: Warm, dry and intact. No rash, cyanosis, or clubbing.        Assessment & Plan:

## 2010-10-03 NOTE — Assessment & Plan Note (Signed)
Lab Results  Component Value Date   HGBA1C 12.5 09/12/2010   HGBA1C 10.5 01/01/2010   CREATININE 0.83 09/12/2010   CREATININE 0.67 06/18/2009   MICROALBUR 105.14* 01/16/2010   MICRALBCREAT 1329.2* 01/16/2010   CHOL 167 06/18/2009   HDL 41 06/18/2009   TRIG 70 06/18/2009    Last eye exam and foot exam:    Component Value Date/Time   HMDIABFOOTEX negative 01/16/2010    Assessment: Diabetes control: not controlled Progress toward goals: unchanged Barriers to meeting goals: adverse effects of medications  Plan: Increased the dose of Lantus to 50 units at bedtime during last visit from 40 units. She was supposed to see Barnabas Harries today but she is not here and so we will reschedule with Barnabas Harries as soon as possible. She will benefit from adding mealtime coverage. She cannot tolerate metformin. Glipizide is other option. Diabetes treatment: continue current medications Refer to: diabetes educator for self-management training and diabetes educator for medical nutrition therapy Instruction/counseling given: reminded to bring blood glucose meter & log to each visit, reminded to bring medications to each visit, discussed the need for weight loss and discussed diet

## 2010-10-04 ENCOUNTER — Telehealth: Payer: Self-pay | Admitting: Dietician

## 2010-10-04 LAB — BASIC METABOLIC PANEL WITH GFR
BUN: 17 mg/dL (ref 6–23)
Calcium: 9.7 mg/dL (ref 8.4–10.5)
Chloride: 105 mEq/L (ref 96–112)
Creat: 0.82 mg/dL (ref 0.50–1.10)
GFR, Est African American: >60 mL/min (ref >60)
GFR, Est Non African American: >60 mL/min (ref >60)
Glucose, Bld: 74 mg/dL (ref 70–99)
Potassium: 4.2 mEq/L (ref 3.5–5.3)

## 2010-10-04 NOTE — Telephone Encounter (Signed)
Calling patient to see how she is tolerating Victoza.  Called MAP at [facility] she has been getting: Horticulturist, commercial- last fill was 8/9 victoza got 18 ml on 7/27, with directions 1.2mg  once daily- they may need new prescription on this soon.  Will await callback on how patient tolerating the Victoza, it doe snot appear to be helping her blood sugars

## 2010-10-08 ENCOUNTER — Encounter: Payer: Self-pay | Admitting: Dietician

## 2010-10-08 ENCOUNTER — Emergency Department (HOSPITAL_COMMUNITY)
Admission: EM | Admit: 2010-10-08 | Discharge: 2010-10-08 | Disposition: A | Discharge status: Home / Self Care | Payer: Self-pay | Attending: Emergency Medicine | Admitting: Emergency Medicine

## 2010-10-08 ENCOUNTER — Ambulatory Visit: Payer: Self-pay | Admitting: Dietician

## 2010-10-08 DIAGNOSIS — E876 Hypokalemia: Secondary | ICD-10-CM | POA: Insufficient documentation

## 2010-10-08 DIAGNOSIS — F329 Major depressive disorder, single episode, unspecified: Secondary | ICD-10-CM | POA: Insufficient documentation

## 2010-10-08 DIAGNOSIS — E1169 Type 2 diabetes mellitus with other specified complication: Secondary | ICD-10-CM | POA: Insufficient documentation

## 2010-10-08 DIAGNOSIS — F3289 Other specified depressive episodes: Secondary | ICD-10-CM | POA: Insufficient documentation

## 2010-10-08 DIAGNOSIS — R45851 Suicidal ideations: Secondary | ICD-10-CM | POA: Insufficient documentation

## 2010-10-08 DIAGNOSIS — Z794 Long term (current) use of insulin: Secondary | ICD-10-CM | POA: Insufficient documentation

## 2010-10-08 DIAGNOSIS — I10 Essential (primary) hypertension: Secondary | ICD-10-CM | POA: Insufficient documentation

## 2010-10-08 LAB — COMPREHENSIVE METABOLIC PANEL
Alkaline Phosphatase: 161 U/L — ABNORMAL HIGH (ref 39–117)
BUN: 16 mg/dL (ref 6–23)
CO2: 31 mEq/L (ref 19–32)
Chloride: 103 mEq/L (ref 96–112)
GFR calc Af Amer: >90 mL/min (ref >90)
GFR calc non Af Amer: 83 mL/min — ABNORMAL LOW (ref >90)
Glucose, Bld: 113 mg/dL — ABNORMAL HIGH (ref 70–99)
Potassium: 3.4 mEq/L — ABNORMAL LOW (ref 3.5–5.1)
Total Bilirubin: 0.3 mg/dL (ref 0.3–1.2)

## 2010-10-08 LAB — CBC
HCT: 41.2 % (ref 36.0–46.0)
Hemoglobin: 14.3 g/dL (ref 12.0–15.0)
WBC: 6.2 10*3/uL (ref 4.0–10.5)

## 2010-10-08 LAB — DIFFERENTIAL
Basophils Relative: 0 % (ref 0–1)
Eosinophils Absolute: 0.1 10*3/uL (ref 0.0–0.7)
Eosinophils Relative: 2 % (ref 0–5)
Monocytes Absolute: 0.3 10*3/uL (ref 0.1–1.0)
Monocytes Relative: 5 % (ref 3–12)
Neutrophils Relative %: 36 % — ABNORMAL LOW (ref 43–77)

## 2010-10-08 LAB — URINALYSIS, ROUTINE W REFLEX MICROSCOPIC
Bilirubin Urine: NEGATIVE
Hgb urine dipstick: NEGATIVE
Protein, ur: >300 mg/dL — AB
Specific Gravity, Urine: 1.021 (ref 1.005–1.030)
Urobilinogen, UA: 1 mg/dL (ref 0.0–1.0)

## 2010-10-08 LAB — RAPID URINE DRUG SCREEN, HOSP PERFORMED
Amphetamines: NOT DETECTED
Benzodiazepines: NOT DETECTED
Cocaine: NOT DETECTED

## 2010-10-08 LAB — URINE MICROSCOPIC-ADD ON

## 2010-10-08 NOTE — Progress Notes (Signed)
  Subjective:    Patient ID: Ann Bryan, female    DOB: 1960-05-04, 50 y.o.   MRN: [id number]  HPI  Patient in for DM education. Homeless, living in her car, severely depressed, overwhelmingly negative thoughts, suicidal last PM, has plan to let her "Sugars get too low".  Review of Systems Not obtained    Objective:   Physical Exam  Not performed      Assessment & Plan:  Patient sent to ED for mental health evaluation. Suicidal in clinic this afternoon. Had plan yesterday but did not carry out- but says she wish that she had. Says she would prefer to be in the morgue. "can just let her sugars go too low". Says she has never been happy "one day in her entire life"

## 2010-10-08 NOTE — Progress Notes (Signed)
Addended by: Resa Miner on: 10/08/2010 04:08 PM   Modules accepted: Orders

## 2010-10-08 NOTE — Progress Notes (Signed)
Patient arrived to visit tearful, saying that she is thinking about killing herself. Says she is living in her car, car is not working correctly,not sleeping well, afraid she'll be fired from her job in [place] because she missed work today to take care of car, unable to verbalize hope. Reports she has been feeling depressed and unhappy most of her life.  Assessed by attending physician, escorted to ER by nurse for assessment.    Of note, patient also verbalized feeling out of control of her diabetes, so kept discussion of this to a minimum. She did say she is taking both lantus and Victoza as prescribed.

## 2010-10-09 ENCOUNTER — Telehealth: Payer: Self-pay | Admitting: Licensed Clinical Social Worker

## 2010-10-09 LAB — GLUCOSE, CAPILLARY: Glucose-Capillary: 206 mg/dL — ABNORMAL HIGH (ref 70–99)

## 2010-10-09 NOTE — Telephone Encounter (Signed)
F/U w/ Ann Bryan.  She spent an overnight at [facility] and she is waiting at [facility] to be seen for [facility] assessment.  I suggested she to go to the [facility] for homeless resources and permanent mail box.  She's looking to work in [place] and use the bus system because her car is shaky right now.   Encouraged and supported her.  Told her to call here at clinics for support and that Beverly Sessions would give her additional support phone numbers.  She has a Section 8 voucher and is expecting to be in housing within the next 30 days.  She will go to [facility] this PM.  Continued SW follow-up.

## 2010-10-10 ENCOUNTER — Telehealth: Payer: Self-pay | Admitting: Dietician

## 2010-10-10 NOTE — Telephone Encounter (Signed)
Called to follow up with patient: [facility] gave her medicine, feels much better. Talked to psychiatrist. On her way to [facility] now. Sleeping in parking lot of hotels- thinking of applying for job at a hotel. Will call us if needed.

## 2010-10-10 NOTE — Telephone Encounter (Signed)
Noted. Sounds hopeful.

## 2010-10-18 ENCOUNTER — Telehealth: Payer: Self-pay | Admitting: Licensed Clinical Social Worker

## 2010-10-18 ENCOUNTER — Ambulatory Visit: Payer: Self-pay | Admitting: Licensed Clinical Social Worker

## 2010-10-18 ENCOUNTER — Encounter: Payer: Self-pay | Admitting: Internal Medicine

## 2010-10-18 ENCOUNTER — Ambulatory Visit (INDEPENDENT_AMBULATORY_CARE_PROVIDER_SITE_OTHER): Payer: Self-pay | Admitting: Internal Medicine

## 2010-10-18 VITALS — BP 154/87 | HR 66 | Temp 97.2°F | Ht 60.0 in | Wt 168.9 lb

## 2010-10-18 DIAGNOSIS — Z590 Homelessness: Secondary | ICD-10-CM

## 2010-10-18 DIAGNOSIS — I10 Essential (primary) hypertension: Secondary | ICD-10-CM

## 2010-10-18 DIAGNOSIS — E119 Type 2 diabetes mellitus without complications: Secondary | ICD-10-CM

## 2010-10-18 DIAGNOSIS — Z23 Encounter for immunization: Secondary | ICD-10-CM

## 2010-10-18 DIAGNOSIS — F329 Major depressive disorder, single episode, unspecified: Secondary | ICD-10-CM

## 2010-10-18 DIAGNOSIS — F3289 Other specified depressive episodes: Secondary | ICD-10-CM

## 2010-10-18 LAB — GLUCOSE, CAPILLARY: Glucose-Capillary: 209 mg/dL — ABNORMAL HIGH (ref 70–99)

## 2010-10-18 NOTE — Patient Instructions (Signed)
Please make a followup appointment in first or second week of December 2012. We will recheck hemoglobin A1c and blood pressure and some labs at that time. Meanwhile, I hope your overall situation improves and we will start seeing better results with everything. Please take all medications regularly as you do. Also try to start walking and exercise as we discussed.

## 2010-10-18 NOTE — Assessment & Plan Note (Addendum)
Pt followed by Dr. Rolena Infante at [facility]. Started on Lexapro 10 mg daily recently after admission to behavioral health for one day. She feels much better and has no suicide ideation now. She expects improvement in her mood and emotional disturbances as her social  life improves. She was living in her car for last 2 months. She is going to see Verlon Setting today.

## 2010-10-18 NOTE — Progress Notes (Signed)
  Subjective:    Patient ID: Ann Bryan, female    DOB: 12/05/1960, 50 y.o.   MRN: [id number]  HPI Ann Bryan is a pleasant 50 year woman with past with history of uncontrolled DM 2, uncontrolled HTN who comes to the clinic for regular followup visit. She was admitted to behavioral health center for today on last Tuesday. She was suicidal at that time. She has multiple social/financial issues going on in her life which made her stressed out. She denies any suicidal ideation now and feels much better. She started on Lexapro from behavioral health and she'll be followed by a psychiatrist there. Her blood pressure today is better than previous 2   readings. Is 154/87. She says that when she checked her blood pressure before he was in the 130s. Also during the hospital admission at behavioral health her blood pressure was in 110 to 130s. She denies any chest pain, short of breath, abdominal pain, nausea, vomiting, fever, chills, diarrhea.  Will give her a flu shot today.   Review of Systems    as per history of present illness, all other systems reviewed and negative. Objective:   Physical Exam Constitutional: Vital signs reviewed.  Patient is a well-developed and well-nourished  in no acute distress and cooperative with exam. Alert and oriented x3.  Head: Normocephalic and atraumatic Mouth: no erythema or exudates, MMM Eyes: PERRL, EOMI, conjunctivae normal, No scleral icterus.  Neck: Supple, Trachea midline normal ROM, No JVD Cardiovascular: RRR, S1 normal, S2 normal, no MRG, pulses symmetric and intact bilaterally Pulmonary/Chest: CTAB, no wheezes, rales, or rhonchi Abdominal: Soft. Non-tender, non-distended, bowel sounds are normal Musculoskeletal: No joint deformities, erythema, or stiffness, ROM full and no nontender Neurological: A&O x3, Strenght is normal and symmetric bilaterally, cranial nerve II-XII are grossly intact, no focal motor deficit, sensory intact to light touch  bilaterally.  Skin: Warm, dry and intact. No rash, cyanosis, or clubbing.          Assessment & Plan:

## 2010-10-18 NOTE — Assessment & Plan Note (Signed)
Continue same medications. Recheck HbA1c in December 2012.

## 2010-10-18 NOTE — Assessment & Plan Note (Signed)
Blood pressure 154/87. Better than previous 2 readings. Patient distressed due to current living and social conditions. I will not increase the dose of any blood pressure medications now. Will recheck during next visit and make appropriate changes as needed.

## 2010-10-21 NOTE — Telephone Encounter (Signed)
Saw patient in clinic on Friday for f/u

## 2010-10-22 NOTE — Progress Notes (Signed)
10/18/10  Soc. Work.   Saw patient for f/u.  She was doing much better since accessing [facility] and she did f/u with the [facility] who gave her mailbox and also is putting her up at boarding house until Sec. 8 comes through.  Sec 8 housing at [employer] is ready however patient has outstanding Statistician. We called Solicitor and [employer] to see if they could help w/ this bill or give her loan.  She is still working part time at [employer].   SW will follow w/ her to make sure she gets into housing.  She can likely manage if housing comes through. [facility] called me back and said they would get up w/ her to try and help w/ bill.

## 2010-10-31 NOTE — Telephone Encounter (Signed)
Saw Roxana in clinic and both [employer] and [employer] called her back to assist w/ [employer].

## 2010-11-12 ENCOUNTER — Other Ambulatory Visit: Payer: Self-pay | Admitting: *Deleted

## 2010-11-12 MED ORDER — LIRAGLUTIDE 18 MG/3ML ~~LOC~~ SOLN: SUBCUTANEOUS | Status: DC

## 2010-11-19 ENCOUNTER — Encounter: Payer: Self-pay | Admitting: *Deleted

## 2010-11-19 NOTE — Progress Notes (Signed)
Pt stopped by the office stating she was out of lantus insulin.  She has ordered from the [facility] but order has not arrived. Called [facility] and verified this is so. Pt given 1 lantus pen from sample room/dr Lynnae January signed

## 2010-12-04 ENCOUNTER — Encounter: Payer: Self-pay | Admitting: Ophthalmology

## 2011-02-04 ENCOUNTER — Other Ambulatory Visit: Payer: Self-pay | Admitting: *Deleted

## 2011-02-04 MED ORDER — LIRAGLUTIDE 18 MG/3ML ~~LOC~~ SOLN: SUBCUTANEOUS | Status: DC

## 2011-02-04 NOTE — Telephone Encounter (Signed)
Refill was called to the [facility].  Sander Nephew, RN 02/04/2011 4:41 PM.

## 2011-04-18 ENCOUNTER — Other Ambulatory Visit: Payer: Self-pay | Admitting: *Deleted

## 2011-04-18 DIAGNOSIS — I10 Essential (primary) hypertension: Secondary | ICD-10-CM

## 2011-04-18 MED ORDER — LISINOPRIL-HYDROCHLOROTHIAZIDE 20-25 MG PO TABS: 1.0000 | ORAL_TABLET | Freq: Every day | ORAL | Status: DC

## 2011-04-18 NOTE — Telephone Encounter (Signed)
Patient should be seen for follow up, please schedule her

## 2011-04-21 NOTE — Telephone Encounter (Signed)
Rx faxed in and message sent to front desk to schedule appointment

## 2011-04-24 ENCOUNTER — Encounter: Payer: Self-pay | Admitting: Ophthalmology

## 2011-05-01 ENCOUNTER — Encounter: Payer: Self-pay | Admitting: Internal Medicine

## 2011-05-01 ENCOUNTER — Ambulatory Visit (INDEPENDENT_AMBULATORY_CARE_PROVIDER_SITE_OTHER): Payer: Self-pay | Admitting: Internal Medicine

## 2011-05-01 VITALS — BP 156/80 | HR 72 | Temp 97.4°F | Ht 60.0 in | Wt 168.5 lb

## 2011-05-01 DIAGNOSIS — K529 Noninfective gastroenteritis and colitis, unspecified: Secondary | ICD-10-CM | POA: Insufficient documentation

## 2011-05-01 DIAGNOSIS — K5289 Other specified noninfective gastroenteritis and colitis: Secondary | ICD-10-CM

## 2011-05-01 DIAGNOSIS — E119 Type 2 diabetes mellitus without complications: Secondary | ICD-10-CM

## 2011-05-01 DIAGNOSIS — Z79899 Other long term (current) drug therapy: Secondary | ICD-10-CM

## 2011-05-01 DIAGNOSIS — E785 Hyperlipidemia, unspecified: Secondary | ICD-10-CM

## 2011-05-01 MED ORDER — GLUCOSE BLOOD VI STRP: ORAL_STRIP | Status: AC

## 2011-05-01 MED ORDER — FREESTYLE FREEDOM LITE W/DEVICE KIT: PACK | Status: DC

## 2011-05-01 NOTE — Patient Instructions (Signed)
Viral Gastroenteritis Viral gastroenteritis is also called stomach flu. This illness is caused by a certain type of germ (virus). It can cause sudden watery poop (diarrhea) and throwing up (vomiting). This can cause you to lose body fluids (dehydration). This illness usually lasts for 3 to 8 days. It usually goes away on its own. HOME CARE   Drink enough fluids to keep your pee (urine) clear or pale yellow. Drink small amounts of fluids often.   Ask your doctor how to replace body fluid losses (rehydration).   Avoid:   Foods high in sugar.   Alcohol.   Bubbly (carbonated) drinks.   Tobacco.   Juice.   Caffeine drinks.   Very hot or cold fluids.   Fatty, greasy foods.   Eating too much at one time.   Dairy products until 24 to 48 hours after your watery poop stops.   You may eat foods with active cultures (probiotics). They can be found in some yogurts and supplements.   Wash your hands well to avoid spreading the illness.   Only take medicines as told by your doctor. Do not give aspirin to children. Do not take medicines for watery poop (antidiarrheals).   Ask your doctor if you should keep taking your regular medicines.   Keep all doctor visits as told.  GET HELP RIGHT AWAY IF:   You cannot keep fluids down.   You do not pee at least once every 6 to 8 hours.   You are short of breath.   You see blood in your poop or throw up. This may look like coffee grounds.   You have belly (abdominal) pain that gets worse or is just in one small spot (localized).   You keep throwing up or having watery poop.   You have a fever.   The patient is a child younger than 3 months, and he or she has a fever.   The patient is a child older than 3 months, and he or she has a fever and problems that do not go away.   The patient is a child older than 3 months, and he or she has a fever and problems that suddenly get worse.   The patient is a baby, and he or she has no tears  when crying.  MAKE SURE YOU:   Understand these instructions.   Will watch your condition.   Will get help right away if you are not doing well or get worse.  Document Released: 06/11/2007 Document Revised: 12/12/2010 Document Reviewed: 10/09/2010 [facility] Patient Information 2012 [place].

## 2011-05-01 NOTE — Progress Notes (Signed)
Patient ID: Ann Bryan, female   DOB: 1960-11-06, 51 y.o.   MRN: [id number]  51 year old woman with past medical history listed below comes to the clinic complaining of abdominal discomfort, diarrhea, loss of appetite for past 3 days. She had some abdominal pain 2 days ago which got better. No sick contacts. Works as a Chartered certified accountant at [employer]. Now some nausea but no vomiting. No blood in stool. Able to keep her food and water down. This has not happened to her usually. No other complaints.   Physical exam   General Appearance:     Filed Vitals:   05/01/11 1544  BP: 156/80  Pulse: 72  Temp: 97.4 F (36.3 C)  TempSrc: Oral  Height: 5' (1.524 m)  Weight: 168 lb 8 oz (76.431 kg)  SpO2: 96%     Alert, cooperative, no distress, appears stated age  Head:    Normocephalic, without obvious abnormality, atraumatic  Eyes:    PERRL, conjunctiva/corneas clear, EOM's intact, fundi    benign, both eyes       Neck:   Supple, symmetrical, trachea midline, no adenopathy;       thyroid:  No enlargement/tenderness/nodules; no carotid   bruit or JVD  Lungs:     Clear to auscultation bilaterally, respirations unlabored  Chest wall:    No tenderness or deformity  Heart:    Regular rate and rhythm, S1 and S2 normal, no murmur, rub   or gallop  Abdomen:     Soft, non-tender, bowel sounds active all four quadrants,    no masses, no organomegaly  Extremities:   Extremities normal, atraumatic, no cyanosis or edema  Pulses:   2+ and symmetric all extremities  Skin:   Skin color, texture, turgor normal, no rashes or lesions  Neurologic:  nonfocal grossly    Review of systems- as per history of present illness

## 2011-05-01 NOTE — Assessment & Plan Note (Signed)
Given her symptoms, time course and seasonal occurrence this seems more likely to be viral gastroenteritis. Treat conservatively with fluid hydration,bowel rest, Tylenol Followup in 2 weeks for diabetes check. At that time inquired about persistence of symptoms. The blood test today.

## 2011-05-08 ENCOUNTER — Other Ambulatory Visit: Payer: Self-pay | Admitting: *Deleted

## 2011-05-08 MED ORDER — LIRAGLUTIDE 18 MG/3ML ~~LOC~~ SOLN: SUBCUTANEOUS | Status: DC

## 2011-05-08 NOTE — Telephone Encounter (Signed)
Victoza refilled - rx request form faxed to [facility].

## 2011-05-27 ENCOUNTER — Encounter: Payer: Self-pay | Admitting: Internal Medicine

## 2011-05-27 ENCOUNTER — Ambulatory Visit (INDEPENDENT_AMBULATORY_CARE_PROVIDER_SITE_OTHER): Payer: Self-pay | Admitting: Internal Medicine

## 2011-05-27 ENCOUNTER — Ambulatory Visit (INDEPENDENT_AMBULATORY_CARE_PROVIDER_SITE_OTHER): Payer: Self-pay | Admitting: Dietician

## 2011-05-27 VITALS — BP 150/80 | HR 67 | Temp 97.0°F | Ht 60.0 in | Wt 168.7 lb

## 2011-05-27 DIAGNOSIS — E785 Hyperlipidemia, unspecified: Secondary | ICD-10-CM

## 2011-05-27 DIAGNOSIS — F329 Major depressive disorder, single episode, unspecified: Secondary | ICD-10-CM

## 2011-05-27 DIAGNOSIS — E119 Type 2 diabetes mellitus without complications: Secondary | ICD-10-CM

## 2011-05-27 DIAGNOSIS — I10 Essential (primary) hypertension: Secondary | ICD-10-CM

## 2011-05-27 LAB — GLUCOSE, CAPILLARY: Glucose-Capillary: 90 mg/dL (ref 70–99)

## 2011-05-27 LAB — LIPID PANEL
Cholesterol: 167 mg/dL (ref 0–200)
HDL: 46 mg/dL (ref >39)
Total CHOL/HDL Ratio: 3.6 Ratio

## 2011-05-27 MED ORDER — INSULIN NPH ISOPHANE & REGULAR (70-30) 100 UNIT/ML ~~LOC~~ SUSP: SUBCUTANEOUS | Status: DC

## 2011-05-27 MED ORDER — ZOLPIDEM TARTRATE 10 MG PO TABS: 10.0000 mg | ORAL_TABLET | Freq: Every evening | ORAL | Status: DC | PRN

## 2011-05-27 NOTE — Assessment & Plan Note (Signed)
Patient was taking Victoza and Lantus. Last hemoglobin A1c in April was greater than 14. Did have her stop both of these medications. Will start her on Novolin 70/30 via pen. Did advise her to do 25 units in the morning and 25 units at nighttime. She is still taking a statin. Her blood pressure is close to goal today however we'll not make change as she has had insomnia several days. We'll see her back in one month for close followup. Did advise her to bring her meter to next visit.

## 2011-05-27 NOTE — Assessment & Plan Note (Signed)
Blood pressure today 150/80. No change to regimen at today's visit. We'll see her back in one month when she is sleeping better and see if there is a positive improvement. She has been in the past with no improvement will consider adding medication or changing dosing at this visit.

## 2011-05-27 NOTE — Progress Notes (Signed)
Diabetes Self-Management Training (DSMT)  Follow-Up 4 Visit- have seen patient ~ 1x per year ongoing  05/27/2011 Ms. Benna Dunks, identified by name and date of birth, is a 51 y.o. female with Type 2 Diabetes. Year of diabetes diagnosis: 1986 Other persons present: no  ASSESSMENT Patient concerns are Problem solving.  Last menstrual period 03/13/2002. There is no height or weight on file to calculate BMI. Lab Results  Component Value Date   LDLCALC 112* 06/18/2009   Lab Results  Component Value Date   HGBA1C >14.0 05/01/2011   Labs reviewed.  Family history of diabetes: Yes Patients belief/attitude about diabetes: Diabetes can be controlled. Self foot exams daily: need to address in future Diabetes Complications: unsure- will discuss at future visit Support systems: lives alone- need to address Special needs: None  Prior DM Education: Yes   Medications See Medications list.  Is interested in learning more and Needs skills/knowledge review   Exercise Plan Doing physical/active work and walking for 60 minutesa day.   Self-Monitoring Medication Nutrition Monitor: wavesense presto Frequency of testing: 1-2 times/day Breakfast: 90-130 Lunch: 200s Supper: 200-300  Hyperglycemia: Yes Daily Hypoglycemia: Not lately- but discussed events in her past    Meal Planning Some knowledge  Assessment comments: patient in much better spirits today. Walks to work and back. Making healthy choices.     INDIVIDUAL DIABETES EDUCATION PLAN:  Nutrition management Medication Monitoring Acute complication: _______________________________________________________________________  Intervention TOPICS COVERED TODAY:  Nutrition management  Reviewed blood glucose goals for pre and post meals and how to evaluate the patients' food intake on their blood glucose level. Meal timing in regards to the patients' current diabetes medication. Medication  Reviewed patients medication for diabetes,  action, purpose, timing of dose and side effects. Monitoring  Purpose and frequency of SMBG. Taught/discussed recording of test results and interpretation of SMBG. Acute complication  Taught treatment of hypoglycemia - the 15 rule.  PATIENTS GOALS/PLAN (copy and paste in patient instructions so patient receives a copy): 1.  Learning Objective:       State importance of increased self monitoring with change in diabetes regimen 2.  Behavioral Objective:         Monitoring: To identify blood glucose trends, I will test blood glucose pre and post meals for 3 days after change in regimen Sometimes 25% Problem Solving: To improve my blood glucose control, I will keep records of food, insulin and blood sugars for 1-3 days afterr regimen change  Never 0%  Personalized Follow-Up Plan for Ongoing Self Management Support:  [place] and CDE visits ______________________________________________________________________   Outcomes Expected outcomes: Demonstrated interest in learning.Expect positive changes in lifestyle. Self-care Barriers: Lack of transportation Education material provided: no- patient denied need Patient to contact team via Phone if problems or questions. Time [id number]     Time out: 1015 Future DSMT - 2 wks   Joeleen Wortley, Butch Penny

## 2011-05-27 NOTE — Patient Instructions (Signed)
You were seen for a check up of your diabetes. We are going to start insulin 70/30 now. Take 25 units before breakfast and 25 units before dinner. This should come in a pen form. We are going to refill your Lorrin Mais today to help you sleep. Continue walking to help with exercise. We will see you back in 1 month.

## 2011-05-27 NOTE — Progress Notes (Signed)
Subjective:     Patient ID: Ann Bryan, female   DOB: 1960-03-24, 51 y.o.   MRN: [id number]  HPI The patient is a 51 year old female who comes in today for a followup of her blood pressure, diabetes, and for medication refill. She states that she's been having a lot of stress lately as car died, and she did have to give it up. However she states that she's feeling better since she gave it up because it was a financial expenditure that she could not afford. She has been walking everywhere. 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 Her air conditioning is not working at home so she does occasionally have hot flashes at night. She states that she did have a bout of them a couple years ago which was worse than this. She states that she has not slept the past 2 nights because of hot flashes as well as insomnia. She states that she does use Ambien when she has a bad night and that helps her to get to sleep. She has been out for some time which is probably causing her more stress and resultant insomnia. She was recently seen and had a hemoglobin A1c greater than 14 so would like to make change at today's visit. She is concerned about her diabetes. She is currently taking victoza and Lantus. She states that would not having a car she does have to walk everywhere so she thought that it would be better due to the exercise. She's not having any chest pain, shortness of breath, weakness, hypoglycemic events, nausea, vomiting, diarrhea. Viral diarrheal episodes did resolve fully. No residual symptoms at this time. She does note that her sugars have been fairly high at home lately on a consistent basis. States the Lexapro is helping with her mood and she is seeing [facility] for medication and counseling.  Review of Systems  Constitutional: Positive for activity change. Negative for fever, chills, appetite change,  fatigue and unexpected weight change.       Increased level of activity  Respiratory: Negative for cough, chest tightness, shortness of breath and wheezing.   Cardiovascular: Negative for chest pain, palpitations and leg swelling.  Gastrointestinal: Negative for nausea, vomiting, abdominal pain, diarrhea, constipation and abdominal distention.  Musculoskeletal: Negative.   Skin: Negative.   Neurological: Negative.   Hematological: Negative.   Psychiatric/Behavioral: Negative.     Vitals: Blood pressure: 150/80    Objective:   Physical Exam     Assessment/Plan:   1. Please see problem-oriented charting.  2. Disposition-patient will be seen back in one month for close followup of her diabetes. Have advised her to stop liraglutide, glargine. Have advised her to start using insulin 70/30 via pens. Did advise 25 units with breakfast and 25 units with dinner. Did advise her to continue exercising, did refill her Ambien prescription today. No change to her hypertensive regimen today. Did advise her to check her sugars frequently and record these and bring her meter to next visit. Did give her advice regarding any hypoglycemic events. Did check lipid panel at today's visit we'll discuss results in one month.

## 2011-05-27 NOTE — Assessment & Plan Note (Signed)
She is currently taking Lipitor 40 mg and did get a lipid panel done today.

## 2011-05-27 NOTE — Assessment & Plan Note (Signed)
Patient was having a hard time back in November, has gone to [facility] for some counseling and medical therapy. She states that she is taking Lexapro and can't really notice a difference but states that her mood is improved. She will be going to them for counseling. Did advise her to continue counseling.

## 2011-05-28 ENCOUNTER — Telehealth: Payer: Self-pay | Admitting: *Deleted

## 2011-05-28 NOTE — Telephone Encounter (Signed)
[facility] called - unable to supply pt with Novolin 70/30 - Dr Doug Sou states ok to use Humalin 70/30 with same directions. Call was returned to pharmacy.  Pharmacy also wanted to check if Victoza  And Lantus solostar insulin was d/c. Dr Doug Sou aware. Will be d/c when pt starts on Humalin insulin. Hilda Blades Adorian Gwynne RN 05/28/11 4:45PM

## 2011-06-11 NOTE — Progress Notes (Signed)
Addended by: Orson Gear on: 06/11/2011 10:59 AM   Modules accepted: Orders

## 2011-08-12 ENCOUNTER — Ambulatory Visit (INDEPENDENT_AMBULATORY_CARE_PROVIDER_SITE_OTHER): Payer: Self-pay | Admitting: Internal Medicine

## 2011-08-12 ENCOUNTER — Encounter: Payer: Self-pay | Admitting: Internal Medicine

## 2011-08-12 VITALS — BP 157/89 | HR 72 | Temp 96.9°F | Ht 60.0 in | Wt 167.2 lb

## 2011-08-12 DIAGNOSIS — E119 Type 2 diabetes mellitus without complications: Secondary | ICD-10-CM

## 2011-08-12 DIAGNOSIS — F329 Major depressive disorder, single episode, unspecified: Secondary | ICD-10-CM

## 2011-08-12 DIAGNOSIS — I10 Essential (primary) hypertension: Secondary | ICD-10-CM

## 2011-08-12 DIAGNOSIS — Z79899 Other long term (current) drug therapy: Secondary | ICD-10-CM

## 2011-08-12 DIAGNOSIS — E785 Hyperlipidemia, unspecified: Secondary | ICD-10-CM

## 2011-08-12 LAB — GLUCOSE, CAPILLARY: Glucose-Capillary: 113 mg/dL — ABNORMAL HIGH (ref 70–99)

## 2011-08-12 MED ORDER — INSULIN NPH ISOPHANE & REGULAR (70-30) 100 UNIT/ML ~~LOC~~ SUSP: SUBCUTANEOUS | Status: DC

## 2011-08-12 MED ORDER — "INSULIN SYRINGE-NEEDLE U-100 31G X 15/64"" 0.5 ML MISC": 1.0000 | Freq: Two times a day (BID) | Status: DC

## 2011-08-12 MED ORDER — LISINOPRIL-HYDROCHLOROTHIAZIDE 20-25 MG PO TABS: 1.0000 | ORAL_TABLET | Freq: Every day | ORAL | Status: DC

## 2011-08-12 MED ORDER — CETIRIZINE HCL 10 MG PO TABS: 10.0000 mg | ORAL_TABLET | Freq: Every day | ORAL | Status: DC

## 2011-08-12 NOTE — Patient Instructions (Signed)
You were seen today for your diabetes. We will try some allergy medicine for your cough and we will increase your insulin to 30 units twice daily. Continue to check your sugars as you can and if you are having shaking please check your sugar and have some juice or a snack. Come back and see [place] in 2 months. Our number is [phone].

## 2011-08-13 NOTE — Assessment & Plan Note (Signed)
The patient was off Lexapro for a period of time recently and stated that she did notice discernible change in her mood. She is now currently back on Lexapro and is feeling slightly better however not back to her usual self yet. She does see [facility] for this and will continue to see [facility].

## 2011-08-13 NOTE — Assessment & Plan Note (Signed)
Patient has been out of her blood pressure medications for several days prior to visit. Blood pressure slightly elevated at 15 7/89 today. Will not make any changes at today's visit however will will adjust as needed at next visit.

## 2011-08-13 NOTE — Assessment & Plan Note (Signed)
Patient is taking Lipitor sometimes intermittently. We'll continue as able.

## 2011-08-13 NOTE — Progress Notes (Signed)
Subjective:     Patient ID: Ann Bryan, female   DOB: 28-Jan-1960, 51 y.o.   MRN: [id number]  HPI The patient is a 51 year old female who comes in today for a followup of her diabetes. She also has past medical history of hypertension hyperlipidemia and depression. She states that she is slightly depressed now and had to stop taking her Lexapro one point and she really didn't notice the difference. She is now currently back on Lexapro and is starting to feel a little bit better but isn't quite back to what she was before. She also states that she has been out of some of her medications for 3-4 days prior to this visit. She has not noticed any low sugars at home. She states that they have been mostly about the same as they have been. She hasn't noticed a significant change in her diet. She is having a dry cough lately and this is impacting her at work because she does work in [employer] and her is that her boss is becoming suspicious that she may be sick. She states that she's had allergy like symptoms and has been taking Benadryl for the past several weeks. She states that this has been helping however she could not afford allergy medication and simply took Benadryl instead. No fevers, chills. Just some sinus drainage. She is not having less drainage but the cough is still persistent for the past week.  Review of Systems  HENT: Positive for congestion, rhinorrhea and postnasal drip. Negative for hearing loss, ear pain, nosebleeds, sore throat, facial swelling, sneezing, drooling, mouth sores, trouble swallowing, neck pain, neck stiffness, dental problem, voice change, sinus pressure, tinnitus and ear discharge.   Respiratory: Positive for cough. Negative for apnea, choking, chest tightness, shortness of breath, wheezing and stridor.   Cardiovascular: Negative for chest pain, palpitations and leg swelling.  Gastrointestinal: Negative.   Musculoskeletal: Negative.   Skin: Negative.   Neurological:  Negative.        Objective:   Physical Exam  Constitutional: She is oriented to person, place, and time. She appears well-developed and well-nourished.  HENT:  Head: Normocephalic and atraumatic.  Mouth/Throat: No oropharyngeal exudate.       Some swelling of the nasal turbinates.   Eyes: EOM are normal. Pupils are equal, round, and reactive to light. Right eye exhibits no discharge. Left eye exhibits no discharge.  Neck: Normal range of motion. Neck supple.  Cardiovascular: Normal rate and regular rhythm.   Pulmonary/Chest: Effort normal.  Abdominal: Soft. Bowel sounds are normal.  Musculoskeletal: Normal range of motion.  Neurological: She is alert and oriented to person, place, and time.  Skin: Skin is warm and dry.       Assessment/Plan:   1. Please see problem-oriented charting.  2. Disposition-the patient be seen back in 2 months. Have advised her to increase her Novolin 70/30 to 30 units in the morning and at nighttime. She would not like to go on metformin at this time as she did have bad reaction to it in the past. Did give her a sample vial of Novolin 70/30 at today's visit. Encouraged her to continue taking her medications as prescribed and to let us know if there any hypoglycemic events. Will prescribe Zyrtec for her allergies to see if this helps with her cough. If it does not improve it may be reasonable to consider trial of PPI.

## 2011-08-13 NOTE — Assessment & Plan Note (Signed)
The patient is currently on Novolin 70/30 and does use 25 units twice daily. We did increase his dosing to 30 units twice daily at today's visit as her hemoglobin A1c was still greater than 14. We do not wish to go up on her insulin regimen too quickly for fear of relative hypoglycemia. We will continue to monitor and see her back in 2 months for close followup.

## 2011-09-04 ENCOUNTER — Other Ambulatory Visit: Payer: Self-pay | Admitting: *Deleted

## 2011-09-04 MED ORDER — ATORVASTATIN CALCIUM 40 MG PO TABS: 40.0000 mg | ORAL_TABLET | Freq: Every day | ORAL | Status: DC

## 2011-09-09 ENCOUNTER — Other Ambulatory Visit: Payer: Self-pay | Admitting: *Deleted

## 2011-09-09 DIAGNOSIS — E119 Type 2 diabetes mellitus without complications: Secondary | ICD-10-CM

## 2011-09-09 NOTE — Telephone Encounter (Signed)
[facility] is asking to change Rx to humulin 70/30 VIALs.  Cheaper to purchase syringes than pen needles. Also need 3 month supply

## 2011-09-09 NOTE — Telephone Encounter (Signed)
Called to pharm 

## 2011-09-10 MED ORDER — INSULIN NPH ISOPHANE & REGULAR (70-30) 100 UNIT/ML ~~LOC~~ SUSP: SUBCUTANEOUS | Status: DC

## 2011-09-10 NOTE — Telephone Encounter (Signed)
Rx faxed in.

## 2011-10-10 ENCOUNTER — Encounter: Payer: Self-pay | Admitting: Internal Medicine

## 2011-12-01 ENCOUNTER — Other Ambulatory Visit: Payer: Self-pay | Admitting: *Deleted

## 2011-12-01 DIAGNOSIS — F329 Major depressive disorder, single episode, unspecified: Secondary | ICD-10-CM

## 2011-12-02 MED ORDER — ZOLPIDEM TARTRATE 10 MG PO TABS: 10.0000 mg | ORAL_TABLET | Freq: Every evening | ORAL | Status: DC | PRN

## 2011-12-02 NOTE — Telephone Encounter (Signed)
Rx called in 

## 2011-12-09 ENCOUNTER — Other Ambulatory Visit: Payer: Self-pay | Admitting: *Deleted

## 2011-12-09 NOTE — Telephone Encounter (Signed)
We are not rxing her eyedrops. If she is not following up with eye doctor she needs to. Will not fill.

## 2011-12-09 NOTE — Telephone Encounter (Signed)
pharmacy informed

## 2012-01-02 ENCOUNTER — Other Ambulatory Visit: Payer: Self-pay | Admitting: *Deleted

## 2012-01-02 DIAGNOSIS — I10 Essential (primary) hypertension: Secondary | ICD-10-CM

## 2012-01-04 MED ORDER — METOPROLOL SUCCINATE ER 100 MG PO TB24: 100.0000 mg | ORAL_TABLET | Freq: Every day | ORAL | Status: DC

## 2012-01-05 NOTE — Telephone Encounter (Signed)
Toprol XL rx called to [facility].

## 2012-02-27 ENCOUNTER — Encounter: Payer: Self-pay | Admitting: Internal Medicine

## 2012-03-11 ENCOUNTER — Ambulatory Visit (INDEPENDENT_AMBULATORY_CARE_PROVIDER_SITE_OTHER): Payer: No Typology Code available for payment source | Admitting: Internal Medicine

## 2012-03-11 ENCOUNTER — Ambulatory Visit: Payer: Self-pay

## 2012-03-11 ENCOUNTER — Encounter: Payer: Self-pay | Admitting: Internal Medicine

## 2012-03-11 VITALS — BP 167/84 | HR 84 | Temp 97.3°F | Ht 61.0 in | Wt 176.7 lb

## 2012-03-11 DIAGNOSIS — Z23 Encounter for immunization: Secondary | ICD-10-CM

## 2012-03-11 DIAGNOSIS — I10 Essential (primary) hypertension: Secondary | ICD-10-CM

## 2012-03-11 DIAGNOSIS — F329 Major depressive disorder, single episode, unspecified: Secondary | ICD-10-CM

## 2012-03-11 DIAGNOSIS — E119 Type 2 diabetes mellitus without complications: Secondary | ICD-10-CM

## 2012-03-11 DIAGNOSIS — Z79899 Other long term (current) drug therapy: Secondary | ICD-10-CM

## 2012-03-11 MED ORDER — INSULIN NPH ISOPHANE & REGULAR (70-30) 100 UNIT/ML ~~LOC~~ SUSP: SUBCUTANEOUS | Status: DC

## 2012-03-11 MED ORDER — ZOLPIDEM TARTRATE 10 MG PO TABS: 10.0000 mg | ORAL_TABLET | Freq: Every evening | ORAL | Status: DC | PRN

## 2012-03-11 MED ORDER — LIRAGLUTIDE 18 MG/3ML ~~LOC~~ SOLN: SUBCUTANEOUS | Status: DC

## 2012-03-11 MED ORDER — CETIRIZINE HCL 10 MG PO TABS: 10.0000 mg | ORAL_TABLET | Freq: Every day | ORAL | Status: DC

## 2012-03-11 NOTE — Progress Notes (Signed)
Subjective:     Patient ID: Ann Bryan, female   DOB: 1960/12/20, 52 y.o.   MRN: [id number]  HPI The patient is a 52 YO female with PMH of uncontrolled DM II, HTN, hyperlipidemia who returns 7 months later for a 2 month follow up. She has not been taking any of her medicines except her statin for the last 2 weeks. Before that she was taking one BP medicine. She states she has been taking her humulin 70/30 as prescribed but not checking her sugars in awhile. She had a meter but then thought it wasn't working and got a cheaper meter from [facility] ([facility]). She is scared and came back to the doctor because her sister was diagnosed with cancer. She is having some hand pain that feels like tingling and numbness. She was hoping it was arthritis but thinks it is related to her sugars.    Review of Systems  HENT: Negative for hearing loss, ear pain, nosebleeds, congestion, sore throat, facial swelling, rhinorrhea, sneezing, drooling, mouth sores, trouble swallowing, neck pain, neck stiffness, dental problem, voice change, postnasal drip, sinus pressure, tinnitus and ear discharge.   Respiratory: Negative for apnea, cough, choking, chest tightness, shortness of breath, wheezing and stridor.   Cardiovascular: Negative for chest pain, palpitations and leg swelling.  Gastrointestinal: Negative.   Musculoskeletal: Negative.   Skin: Negative.   Neurological: Negative.        Objective:   Physical Exam  Constitutional: She is oriented to person, place, and time. She appears well-developed and well-nourished.  HENT:  Head: Normocephalic and atraumatic.  Mouth/Throat: No oropharyngeal exudate.  Eyes: EOM are normal. Pupils are equal, round, and reactive to light. Right eye exhibits no discharge. Left eye exhibits no discharge.  Neck: Normal range of motion. Neck supple.  Cardiovascular: Normal rate and regular rhythm.   Pulmonary/Chest: Effort normal.  Abdominal: Soft. Bowel sounds are normal.   Musculoskeletal: Normal range of motion.  Neurological: She is alert and oriented to person, place, and time.  Skin: Skin is warm and dry.       Assessment/Plan:   1. Please see problem oriented charting.  2. Disposition - Refilled several medications and advised her to call us if she is having problems getting her medicines. Start victoza daily [place] and increase her insulin dosing to humulin 70/30 30 units in the morning and 35 units at night time. Given flu shot at today's visit. HgA1c is improved but still needs work. Restart BP medicines and see back in 2 months to see how her BP is doing.

## 2012-03-11 NOTE — Assessment & Plan Note (Signed)
Lab Results  Component Value Date   HGBA1C 13.8 03/11/2012   HGBA1C >14.0 08/12/2011   HGBA1C >14.0 05/01/2011     Assessment:  Diabetes control: poor control (HgbA1C >9%)  Progress toward A1C goal:  improved  Comments:   Plan:  Medications:  humulin 70/30 35 units at night 30 units in morning, victoza daily  Home glucose monitoring:   Frequency: 4 times a day   Timing: before meals;before breakfast  Instruction/counseling given: reminded to bring blood glucose meter & log to each visit and discussed the need for weight loss  Educational resources provided: brochure  Self management tools provided: instructions for home glucose monitoring  Other plans:

## 2012-03-11 NOTE — Assessment & Plan Note (Signed)
Not taking any medicines for 2 weeks and will restart lisinopril/hctz and call health department to see if metoprolol XL can be acquired or what it needs to be switched to if it cannot. Will see her back in 2 months.

## 2012-03-11 NOTE — Patient Instructions (Addendum)
Increase your insulin to 35 units at night time and 30 units in the morning. We will start victoza which you inject 1 time per day.   Come back in 2 months to check on your sugars and download your meter. We will call you about going to the nutrition lady across the street.

## 2012-03-26 ENCOUNTER — Encounter: Payer: Self-pay | Admitting: *Deleted

## 2012-03-26 ENCOUNTER — Encounter: Payer: No Typology Code available for payment source | Attending: Internal Medicine | Admitting: *Deleted

## 2012-03-26 VITALS — Ht 61.0 in | Wt 175.3 lb

## 2012-03-26 DIAGNOSIS — Z713 Dietary counseling and surveillance: Secondary | ICD-10-CM | POA: Insufficient documentation

## 2012-03-26 DIAGNOSIS — E1139 Type 2 diabetes mellitus with other diabetic ophthalmic complication: Secondary | ICD-10-CM

## 2012-03-26 DIAGNOSIS — E1129 Type 2 diabetes mellitus with other diabetic kidney complication: Secondary | ICD-10-CM

## 2012-03-26 NOTE — Patient Instructions (Signed)
Plan:  Aim for 3 Carb Choices per meal (45 grams) +/- 1 either way  Aim for 0-1 Carbs per snack if hungry  Consider reading food labels for Total Carbohydrate and Fat Grams of foods

## 2012-03-26 NOTE — Progress Notes (Signed)
  Medical Nutrition Therapy:  Appt start time: 0945 end time:  1045.  Assessment:  Primary concerns today: Ann Bryan here for her diabetes. 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 Lives alone so does own grocery shopping and food prep. Eats out often. SMBG when able to pay for strips. Doesn't have car so walks everywhere. Enjoys photography and to travel but cannot afford right now. She usually gets a ride to work, but if not, it takes over an hour to walk the distance. She states the addition of Victoza has made a dramatic improvement in her BGs.  MEDICATIONS: see list. Diabetes medications include Humulin 70/30 twice daily, and Victoza at night   DIETARY INTAKE:  Usual eating pattern includes 1-2 meals and 0-1 snacks per day.  Everyday foods include easily obtained foods.  Avoided foods include regular soda, sweet tea, etc.    24-hr recall:  B ( AM): skips often No set schedule anymore. Snk ( AM): none  L ( PM): skips due to high BG, starting to eat a grilled chic biscuit with egg and cheese, water or diet soda Snk ( PM): none D ( PM): lives near a cafeteria so eats there: vegetable platter with meat and roll, unsweetened tea or water Snk ( PM): popcorn sometimes Beverages: unsweetened tea, water diet soda  Usual physical activity: walks everywhere, no car  Estimated energy needs: 1500 calories 170 g carbohydrates 112 g protein 42 g fat  Progress Towards Goal(s):  In progress.   Nutritional Diagnosis:  NB-1.1 Food and nutrition-related knowledge deficit As related to diabetes management within her means.  As evidenced by A1c of 13.8% on 03/11/12    Intervention:  Nutrition counseling and diabetes education initiated. Discussed basic physiology of diabetes, SMBG and rationale of checking BG at alternate times of day, A1c, Carb Counting and reading food labels, and benefits of increased activity. She expresses good understanding of info taught and high  motivation to manage her BG better. Her living conditions both financial and physical are strong barriers. Also discussed the suggestion of looking for a job that would include benefits. Mentioned  as a possibility either in food service or CNA etc. She voiced acknowledgement of what a positive impact that would have on her overall health outcomes.  Handouts given during visit include: Living Well with Diabetes Carb Counting and Food Label handouts Meal Plan Card  Diabetes Medication Sheet  Monitoring/Evaluation:  Dietary intake, exercise, reading food labels, and body weight in 4 week(s).

## 2012-04-13 ENCOUNTER — Encounter: Payer: Self-pay | Admitting: Internal Medicine

## 2012-04-13 DIAGNOSIS — R269 Unspecified abnormalities of gait and mobility: Secondary | ICD-10-CM | POA: Insufficient documentation

## 2012-04-16 ENCOUNTER — Telehealth: Payer: Self-pay | Admitting: *Deleted

## 2012-04-16 NOTE — Telephone Encounter (Signed)
Fax from [facility] - pt's requesting to be changed from vial to [place]. Thanks

## 2012-04-20 MED ORDER — INSULIN NPH (HUMAN) (ISOPHANE) 100 UNIT/ML ~~LOC~~ SUSP: SUBCUTANEOUS | Status: DC

## 2012-04-20 NOTE — Telephone Encounter (Signed)
[facility] pharmacy informed of Humulin N kwikpen rx.

## 2012-04-20 NOTE — Telephone Encounter (Signed)
Done, thanks.  Dr. Doug Sou

## 2012-04-21 ENCOUNTER — Telehealth: Payer: Self-pay | Admitting: *Deleted

## 2012-04-21 MED ORDER — INSULIN NPH (HUMAN) (ISOPHANE) 100 UNIT/ML ~~LOC~~ SUSP: SUBCUTANEOUS | Status: DC

## 2012-04-21 NOTE — Telephone Encounter (Signed)
Yes, that's fine. Will change in epic.

## 2012-04-21 NOTE — Telephone Encounter (Signed)
Call from [facility] stating the Rx for Humulin pens had a quantity of 3 ml for a month. With the pens pt would need 2 boxes of pens or 30 ml a month.  I made the change with pharmacy.  Can you change in EPIC? Is this okay with you?

## 2012-04-30 ENCOUNTER — Ambulatory Visit: Payer: No Typology Code available for payment source | Admitting: *Deleted

## 2012-05-04 ENCOUNTER — Telehealth: Payer: Self-pay | Admitting: *Deleted

## 2012-05-06 NOTE — Telephone Encounter (Signed)
review 

## 2012-05-07 ENCOUNTER — Encounter: Payer: Self-pay | Admitting: Internal Medicine

## 2012-05-14 ENCOUNTER — Encounter: Payer: Self-pay | Admitting: Internal Medicine

## 2012-05-18 ENCOUNTER — Telehealth: Payer: Self-pay | Admitting: *Deleted

## 2012-05-18 NOTE — Telephone Encounter (Signed)
Call from pt - requesting sample of Victoza; stated [facility] does not have her supply yet and she has been receiving samples but has trouble getting there d/t her job and lack of transportation. I told her we do not have Victoza; she understood.

## 2012-06-11 ENCOUNTER — Ambulatory Visit (INDEPENDENT_AMBULATORY_CARE_PROVIDER_SITE_OTHER): Payer: No Typology Code available for payment source | Admitting: Internal Medicine

## 2012-06-11 VITALS — BP 147/72 | HR 60 | Temp 97.4°F | Wt 178.8 lb

## 2012-06-11 DIAGNOSIS — E785 Hyperlipidemia, unspecified: Secondary | ICD-10-CM

## 2012-06-11 DIAGNOSIS — K0889 Other specified disorders of teeth and supporting structures: Secondary | ICD-10-CM

## 2012-06-11 DIAGNOSIS — H409 Unspecified glaucoma: Secondary | ICD-10-CM

## 2012-06-11 DIAGNOSIS — K089 Disorder of teeth and supporting structures, unspecified: Secondary | ICD-10-CM

## 2012-06-11 DIAGNOSIS — E119 Type 2 diabetes mellitus without complications: Secondary | ICD-10-CM

## 2012-06-11 DIAGNOSIS — R269 Unspecified abnormalities of gait and mobility: Secondary | ICD-10-CM

## 2012-06-11 DIAGNOSIS — I10 Essential (primary) hypertension: Secondary | ICD-10-CM

## 2012-06-11 LAB — GLUCOSE, CAPILLARY: Glucose-Capillary: 222 mg/dL — ABNORMAL HIGH (ref 70–99)

## 2012-06-11 MED ORDER — INSULIN NPH (HUMAN) (ISOPHANE) 100 UNIT/ML ~~LOC~~ SUSP: SUBCUTANEOUS | Status: DC

## 2012-06-11 MED ORDER — EXENATIDE 5 MCG/0.02ML ~~LOC~~ SOPN: 5.0000 ug | PEN_INJECTOR | Freq: Two times a day (BID) | SUBCUTANEOUS | Status: DC

## 2012-06-11 NOTE — Patient Instructions (Signed)
We will try to start you on byetta which is a twice daily injection which is similar to [place]. If you are unable to get byetta then we will increase your insulin to 34 units in the morning and 38 units with dinner.   Work on continuing to exercise and watch what you are eating.  Come back in 1-2 months.   Exercise to Lose Weight Exercise and a healthy diet may help you lose weight. Your doctor may suggest specific exercises. EXERCISE IDEAS AND TIPS  Choose low-cost things you enjoy doing, such as walking, bicycling, or exercising to workout videos.  Take stairs instead of the elevator.  Walk during your lunch break.  Park your car further away from work or school.  Go to a gym or an exercise class.  Start with 5 to 10 minutes of exercise each day. Build up to 30 minutes of exercise 4 to 6 days a week.  Wear shoes with good support and comfortable clothes.  Stretch before and after working out.  Work out until you breathe harder and your heart beats faster.  Drink extra water when you exercise.  Do not do so much that you hurt yourself, feel dizzy, or get very short of breath. Exercises that burn about 150 calories:  Running 1  miles in 15 minutes.  Playing volleyball for 45 to 60 minutes.  Washing and waxing a car for 45 to 60 minutes.  Playing touch football for 45 minutes.  Walking 1  miles in 35 minutes.  Pushing a stroller 1  miles in 30 minutes.  Playing basketball for 30 minutes.  Raking leaves for 30 minutes.  Bicycling 5 miles in 30 minutes.  Walking 2 miles in 30 minutes.  Dancing for 30 minutes.  Shoveling snow for 15 minutes.  Swimming laps for 20 minutes.  Walking up stairs for 15 minutes.  Bicycling 4 miles in 15 minutes.  Gardening for 30 to 45 minutes.  Jumping rope for 15 minutes.  Washing windows or floors for 45 to 60 minutes. Document Released: 01/25/2010 Document Revised: 03/17/2011 Document Reviewed: 01/25/2010 [facility]  Patient Information 2014 [place], [place].

## 2012-06-13 ENCOUNTER — Encounter: Payer: Self-pay | Admitting: Internal Medicine

## 2012-06-13 NOTE — Assessment & Plan Note (Signed)
Fall Screening 03/11/2012  Falls in the past year? Yes  Number of falls in past year 1  Was there an injury with Fall? Yes      Assessment: Progress toward fall prevention goal:   improved Comments:   Plan: Fall prevention plans: better control of medical problems has improved ability to ambulate consistently without falling. Educational resources provided:  support Self management tools provided:

## 2012-06-13 NOTE — Assessment & Plan Note (Signed)
Continue lipitor 40 mg daily. LDL goal for her is < 70 given DM II and other risk factors.

## 2012-06-13 NOTE — Assessment & Plan Note (Signed)
BP Readings from Last 3 Encounters:  06/11/12 147/72  03/11/12 167/84  08/12/11 157/89    Lab Results  Component Value Date   NA 144 10/08/2010   K 3.4* 10/08/2010   CREATININE 0.82 10/08/2010    Assessment: Blood pressure control: mildly elevated Progress toward BP goal:  improved Comments:   Plan: Medications:  continue current medications, lisinopril/hctz 20/25 daily, toprol xl 100 mg daily Educational resources provided: brochure Self management tools provided: instructions for home blood pressure monitoring Other plans:

## 2012-06-13 NOTE — Progress Notes (Signed)
Subjective:     Patient ID: Ann Bryan, female   DOB: 1960-02-11, 52 y.o.   MRN: [id number]  HPI The patient is a 52 YO female with PMH of uncontrolled DM II, HTN, hyperlipidemia who returns for follow up on BP and sugars. She states she has been taking her humulin 70/30 as prescribed and started Victoza as prescribed at last visit. However the health Department is unable to obtain victoza anymore and so she has been off [place] since April.. She has been checking her sugars regularly. She states that on the Victoza her morning sugars were between 130 and 150. Currently her morning sugars are between 200-300. She has certainly noticed the difference being off Victoza. She states that the tingling numbness in her hand has become better. She does not notice it anymore.she continues to take her blood pressure medications as prescribed. She is otherwise doing well and not having any new symptoms. No chest pain, shortness of breath, nausea, vomiting, diarrhea.she notes some blurry vision in her eyes. She also notes that she's been off of her Betimol drops for her eyes for more than one year. She states that her pressures in her eyes were borderline elevated and thus this medication was started some time ago. She would like to go back to an eye doctor and followup on that. She also notes that she broke one of her teeth recently in the root is very painful. It's in the front of her mouth and she is currently unable to eat the majority of foods. She has not noticed that it's been red or swollen. She denies any fevers or chills at home.   Review of Systems  HENT: Negative for hearing loss, ear pain, nosebleeds, congestion, sore throat, facial swelling, rhinorrhea, sneezing, drooling, mouth sores, trouble swallowing, neck pain, neck stiffness, dental problem, voice change, postnasal drip, sinus pressure, tinnitus and ear discharge.   Respiratory: Negative for apnea, cough, choking, chest tightness, shortness of  breath, wheezing and stridor.   Cardiovascular: Negative for chest pain, palpitations and leg swelling.  Gastrointestinal: Negative.   Musculoskeletal: Negative.   Skin: Negative.   Neurological: Negative.        Objective:   Physical Exam  Constitutional: She is oriented to person, place, and time. She appears well-developed and well-nourished.  HENT:  Head: Normocephalic and atraumatic.  Mouth/Throat: No oropharyngeal exudate.  Mouth does have several broken teeth including several of the top front teeth. She has multiple teeth removed in the past.   Eyes: EOM are normal. Pupils are equal, round, and reactive to light. Right eye exhibits no discharge. Left eye exhibits no discharge.  Neck: Normal range of motion. Neck supple.  Cardiovascular: Normal rate and regular rhythm.   Pulmonary/Chest: Effort normal.  Abdominal: Soft. Bowel sounds are normal.  Musculoskeletal: Normal range of motion.  Neurological: She is alert and oriented to person, place, and time.  Skin: Skin is warm and dry.       Assessment/Plan:   1. Please see problem oriented charting.  2. Disposition - Sent new prescription for byetta [place] [facility] if she is able to get it from [facility]. If not will increase her humulin 70/30 mix to 34 units in the morning and 38 units at night time. If she will start byetta will leave her regimen the same with 30 units in the morning and 35 units with dinner. HgA1c is improved but still needs work. Manual review of sugars on her meter indicate that she was doing  better on victoza and having 130s-150s in the morning and off victoza is now up to 200-300 in the morning. Also advised that weight loss and exercise would be beneficial. She will be referred to dentist and to ophthamology for history of glaucoma and need for diabetic screening. She will be seen back in 1-2 months for close follow up. She will likely come back in August as I am not in the clinic in July and she would rather see me  rather than a different provider. She knows and was reminded to call with high or low sugars for regimen adjustment.

## 2012-06-13 NOTE — Assessment & Plan Note (Signed)
Lab Results  Component Value Date   HGBA1C 12.2 06/11/2012   HGBA1C 13.8 03/11/2012   HGBA1C >14.0 08/12/2011     Assessment: Diabetes control: poor control (HgbA1C >9%) Progress toward A1C goal:  improved Comments:   Plan: Medications:  Increase humulin 70/30 to 34 units with breakfast and 38 units with dinner. Will try to get byetta through [facility] as victoza is no longer available Home glucose monitoring: Frequency: 3 times a day Timing: before breakfast;at bedtime;after lunch Instruction/counseling given: reminded to get eye exam, reminded to bring medications to each visit and discussed the need for weight loss Educational resources provided: brochure Self management tools provided: home glucose logbook Other plans:

## 2012-06-14 NOTE — Progress Notes (Signed)
Case discussed with Dr. Doug Sou immediately after the resident saw the patient.  We reviewed the residens history and exam and pertinent patient test results.  I agree with the assessment, diagnosis and plan of care documented in the resident's note.

## 2012-06-16 ENCOUNTER — Telehealth: Payer: Self-pay | Admitting: *Deleted

## 2012-06-16 NOTE — Telephone Encounter (Signed)
I will refer this to Dr. Doug Sou.

## 2012-06-16 NOTE — Telephone Encounter (Signed)
Call from [facility] stating they have received the shipment of Victoza in for the pt BUT yesterday they received a Rx for Byetta. [facility] can not get Byetta in so can they d/c the order for byetta and give the pt the original Rx for Victoza. Unable to reach Dr Doug Sou today.  Return call to [facility] # [phone]

## 2012-06-17 NOTE — Telephone Encounter (Signed)
[facility] informed

## 2012-06-17 NOTE — Telephone Encounter (Signed)
Would be happy for the patient to receive victoza however she told me they were unable to get it and this is why the rx was switched. Please have health department give patient victoza and stop byetta.   Dr. Doug Sou

## 2012-07-15 ENCOUNTER — Other Ambulatory Visit: Payer: Self-pay

## 2012-08-05 ENCOUNTER — Other Ambulatory Visit: Payer: Self-pay | Admitting: *Deleted

## 2012-08-05 DIAGNOSIS — I10 Essential (primary) hypertension: Secondary | ICD-10-CM

## 2012-08-05 MED ORDER — LISINOPRIL-HYDROCHLOROTHIAZIDE 20-25 MG PO TABS: 1.0000 | ORAL_TABLET | Freq: Every day | ORAL | Status: DC

## 2012-09-29 ENCOUNTER — Other Ambulatory Visit: Payer: Self-pay | Admitting: *Deleted

## 2012-09-29 MED ORDER — ATORVASTATIN CALCIUM 40 MG PO TABS: 40.0000 mg | ORAL_TABLET | Freq: Every day | ORAL | Status: DC

## 2012-09-30 NOTE — Telephone Encounter (Signed)
Faxed in

## 2012-10-04 ENCOUNTER — Other Ambulatory Visit: Payer: Self-pay | Admitting: *Deleted

## 2012-10-04 DIAGNOSIS — I10 Essential (primary) hypertension: Secondary | ICD-10-CM

## 2012-10-05 ENCOUNTER — Other Ambulatory Visit: Payer: Self-pay | Admitting: *Deleted

## 2012-10-06 MED ORDER — LISINOPRIL-HYDROCHLOROTHIAZIDE 20-25 MG PO TABS: 1.0000 | ORAL_TABLET | Freq: Every day | ORAL | Status: DC

## 2012-10-06 NOTE — Telephone Encounter (Signed)
This patient has not been back in some time and needs to be seen. Will allow one month's supply of her medications and she needs to be seen in clinic before that supply runs out.  Dr. Doug Sou

## 2012-10-13 ENCOUNTER — Other Ambulatory Visit: Payer: Self-pay | Admitting: *Deleted

## 2012-10-13 DIAGNOSIS — I10 Essential (primary) hypertension: Secondary | ICD-10-CM

## 2012-10-13 MED ORDER — LISINOPRIL-HYDROCHLOROTHIAZIDE 20-25 MG PO TABS: 1.0000 | ORAL_TABLET | Freq: Every day | ORAL | Status: DC

## 2012-11-02 ENCOUNTER — Ambulatory Visit: Payer: Self-pay

## 2012-11-10 ENCOUNTER — Encounter: Payer: Self-pay | Admitting: Internal Medicine

## 2012-11-11 ENCOUNTER — Other Ambulatory Visit: Payer: Self-pay

## 2012-11-16 ENCOUNTER — Ambulatory Visit: Payer: No Typology Code available for payment source

## 2012-11-23 ENCOUNTER — Ambulatory Visit (INDEPENDENT_AMBULATORY_CARE_PROVIDER_SITE_OTHER): Payer: No Typology Code available for payment source | Admitting: Internal Medicine

## 2012-11-23 ENCOUNTER — Encounter: Payer: Self-pay | Admitting: Dietician

## 2012-11-23 ENCOUNTER — Encounter: Payer: Self-pay | Admitting: Internal Medicine

## 2012-11-23 VITALS — BP 192/83 | HR 70 | Temp 97.0°F | Ht 60.0 in | Wt 178.9 lb

## 2012-11-23 DIAGNOSIS — F3289 Other specified depressive episodes: Secondary | ICD-10-CM

## 2012-11-23 DIAGNOSIS — I10 Essential (primary) hypertension: Secondary | ICD-10-CM

## 2012-11-23 DIAGNOSIS — E785 Hyperlipidemia, unspecified: Secondary | ICD-10-CM

## 2012-11-23 DIAGNOSIS — H579 Unspecified disorder of eye and adnexa: Secondary | ICD-10-CM

## 2012-11-23 DIAGNOSIS — E1139 Type 2 diabetes mellitus with other diabetic ophthalmic complication: Secondary | ICD-10-CM

## 2012-11-23 DIAGNOSIS — R269 Unspecified abnormalities of gait and mobility: Secondary | ICD-10-CM

## 2012-11-23 DIAGNOSIS — E1129 Type 2 diabetes mellitus with other diabetic kidney complication: Secondary | ICD-10-CM

## 2012-11-23 DIAGNOSIS — E11319 Type 2 diabetes mellitus with unspecified diabetic retinopathy without macular edema: Secondary | ICD-10-CM

## 2012-11-23 DIAGNOSIS — Z23 Encounter for immunization: Secondary | ICD-10-CM

## 2012-11-23 DIAGNOSIS — IMO0002 Reserved for concepts with insufficient information to code with codable children: Secondary | ICD-10-CM

## 2012-11-23 DIAGNOSIS — F329 Major depressive disorder, single episode, unspecified: Secondary | ICD-10-CM

## 2012-11-23 LAB — BASIC METABOLIC PANEL WITH GFR
BUN: 18 mg/dL (ref 6–23)
Calcium: 9.5 mg/dL (ref 8.4–10.5)
Chloride: 102 mEq/L (ref 96–112)
GFR, Est African American: >89 mL/min
GFR, Est Non African American: 79 mL/min
Potassium: 4.4 mEq/L (ref 3.5–5.3)

## 2012-11-23 LAB — POCT GLYCOSYLATED HEMOGLOBIN (HGB A1C): Hemoglobin A1C: 10.5

## 2012-11-23 LAB — LIPID PANEL
HDL: 45 mg/dL (ref >39)
LDL Cholesterol: 112 mg/dL — ABNORMAL HIGH (ref 0–99)

## 2012-11-23 LAB — GLUCOSE, CAPILLARY: Glucose-Capillary: 199 mg/dL — ABNORMAL HIGH (ref 70–99)

## 2012-11-23 MED ORDER — METOPROLOL SUCCINATE ER 100 MG PO TB24: 100.0000 mg | ORAL_TABLET | Freq: Every day | ORAL | Status: DC

## 2012-11-23 MED ORDER — AMLODIPINE BESYLATE 10 MG PO TABS: 10.0000 mg | ORAL_TABLET | Freq: Every day | ORAL | Status: DC

## 2012-11-23 MED ORDER — ZOLPIDEM TARTRATE 5 MG PO TABS: 5.0000 mg | ORAL_TABLET | Freq: Every evening | ORAL | Status: DC | PRN

## 2012-11-23 MED ORDER — LISINOPRIL-HYDROCHLOROTHIAZIDE 20-25 MG PO TABS: 1.0000 | ORAL_TABLET | Freq: Every day | ORAL | Status: DC

## 2012-11-23 MED ORDER — LIRAGLUTIDE 18 MG/3ML ~~LOC~~ SOPN: 1.2000 mL | PEN_INJECTOR | Freq: Every day | SUBCUTANEOUS | Status: DC

## 2012-11-23 MED ORDER — TIMOLOL HEMIHYDRATE 0.5 % OP SOLN: 1.0000 [drp] | Freq: Every day | OPHTHALMIC | Status: DC

## 2012-11-23 NOTE — Assessment & Plan Note (Signed)
BP Readings from Last 3 Encounters:  11/23/12 192/83  06/11/12 147/72  03/11/12 167/84    Lab Results  Component Value Date   NA 144 10/08/2010   K 3.4* 10/08/2010   CREATININE 0.82 10/08/2010    Assessment: Blood pressure control: severely elevated Progress toward BP goal:  deteriorated Comments: unclear if she is taking her medications recently as her HR is usually lower on her toprol-xl and her pressures are normally more moderate.   Plan: Medications:  continue current medications, lisinopril/HCTZ 20/25 daily, toprol-xl 100 mg daily, add amlodipine 10 mg daily. Educational resources provided: brochure Self management tools provided: instructions for home blood pressure monitoring Other plans:

## 2012-11-23 NOTE — Progress Notes (Signed)
Case discussed with Dr. Kollar soon after the resident saw the patient.  We reviewed the resident's history and exam and pertinent patient test results.  I agree with the assessment, diagnosis, and plan of care documented in the resident's note. 

## 2012-11-23 NOTE — Patient Instructions (Signed)
We will see you back the last week in December to check on your weight and your sugars. Work on taking your own food to work so that you can eat healthier.   We will add another blood pressure medicine to help with your blood pressure. It is called amlodipine and you will take 1 pill a day.   Call us with questions or problems at [phone].

## 2012-11-23 NOTE — Progress Notes (Unsigned)
Patient ID: Ann Bryan, female   DOB: Sep 17, 1960, 52 y.o.   MRN: [id number] Unable to obtain acceptable retinal images for transmission. Please refer to local ophthalmologist per patient request as she does not have transportation.

## 2012-11-23 NOTE — Assessment & Plan Note (Signed)
Lab Results  Component Value Date   HGBA1C 10.5 11/23/2012   HGBA1C 12.2 06/11/2012   HGBA1C 13.8 03/11/2012     Assessment: Diabetes control: poor control (HgbA1C >9%) Progress toward A1C goal:  improved Comments:   Plan: Medications:  continue current medications, victoza [place] daily, increase 70/30 to 34 units in AM and 38 units in PM.  Home glucose monitoring: Frequency: once a day Timing: before breakfast Instruction/counseling given: reminded to get eye exam, discussed the need for weight loss and discussed diet Educational resources provided: brochure Self management tools provided: copy of home glucose meter download;home glucose logbook Other plans: her job limits her due to constant unhealthy food and drinks at work at [employer].

## 2012-11-23 NOTE — Assessment & Plan Note (Signed)
Fall Screening 03/11/2012 11/23/2012  Falls in the past year? Yes No  Number of falls in past year 1 -  Was there an injury with Fall? Yes -      Assessment: Progress toward fall prevention goal:   improved Comments:   Plan: Fall prevention plans: appropriate shoes and working on sugar control Educational resources provided:   none Self management tools provided:   diabetic and nutritional counseling

## 2012-11-23 NOTE — Assessment & Plan Note (Signed)
She is struggling right now with some depression but she is not open to medication at this time. She does not feel that she has the time for counseling.

## 2012-11-23 NOTE — Assessment & Plan Note (Signed)
Check BMP and microalbumin to creatinine ratio today.

## 2012-11-23 NOTE — Progress Notes (Signed)
Subjective:     Patient ID: Ann Bryan, female   DOB: [birth year], 52 y.o.   MRN: [id number]  HPI The patient is a 52 YO female with PMH of uncontrolled DM II, HTN, hyperlipidemia who returns for follow up on BP and sugars. She states she has been taking her humulin 70/30 as prescribed and started Victoza as prescribed at last visit. She has been checking her sugars mostly in the mornings. She states that her sugars have been up and down. She is stressed right now and a little depressed. She denies SI/HI. 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 She walk most places and thinks that her exercise is okay.  She continues to take her blood pressure medications as prescribed. She is otherwise doing well and not having any new symptoms. No chest pain, shortness of breath, nausea, vomiting, diarrhea. She notes some blurry vision in her eyes. She would like to go back to an eye doctor and followup on that. She denies any fevers or chills at home.   Review of Systems  HENT: Negative for congestion, dental problem, drooling, ear discharge, ear pain, facial swelling, hearing loss, mouth sores, nosebleeds, postnasal drip, rhinorrhea, sinus pressure, sneezing, sore throat, tinnitus, trouble swallowing and voice change.   Respiratory: Negative for apnea, cough, choking, chest tightness, shortness of breath, wheezing and stridor.   Cardiovascular: Negative for chest pain, palpitations and leg swelling.  Gastrointestinal: Negative.   Musculoskeletal: Negative.  Negative for neck pain and neck stiffness.  Skin: Negative.   Neurological: Negative.        Objective:   Physical Exam  Constitutional: She is oriented to person, place, and time. She appears well-developed and well-nourished.  HENT:  Head: Normocephalic and atraumatic.  Mouth/Throat: No oropharyngeal exudate.  She has multiple teeth removed in the past.   Eyes:  EOM are normal. Pupils are equal, round, and reactive to light. Right eye exhibits no discharge. Left eye exhibits no discharge.  Neck: Normal range of motion. Neck supple.  Cardiovascular: Normal rate and regular rhythm.   Pulmonary/Chest: Effort normal.  Abdominal: Soft. Bowel sounds are normal.  Musculoskeletal: Normal range of motion.  Neurological: She is alert and oriented to person, place, and time.  Skin: Skin is warm and dry.       Assessment/Plan:   1. Please see problem oriented charting.  2. Disposition - Patient will continue victoza. She will increase her 70/30 to 34 units in the morning and 38 units at night time. Her HgA1c is improved but still needs work. Also advised that weight loss and exercise would be beneficial. She will be seen back at the end of December for close follow up and her orange card is in question starting the end of January and she is worried that she will not be able to come back. Flu shot given today. Retinal camera used to take picture for diabetic retinopathy check. Lipid panel, BMP checked at today's visit and microalbumin to creatinine ratio checked today. Foot exam performed.

## 2012-11-23 NOTE — Addendum Note (Signed)
Addended by: Vertell Novak A on: 11/23/2012 02:08 PM   Modules accepted: Orders

## 2012-11-23 NOTE — Assessment & Plan Note (Signed)
Check lipid panel today, last she was not at goal.

## 2012-11-24 LAB — MICROALBUMIN / CREATININE URINE RATIO
Creatinine, Urine: 64.4 mg/dL
Microalb Creat Ratio: 3046.7 mg/g — ABNORMAL HIGH (ref 0.0–30.0)
Microalb, Ur: 196.21 mg/dL — ABNORMAL HIGH (ref 0.00–1.89)

## 2012-11-25 ENCOUNTER — Telehealth: Payer: Self-pay | Admitting: *Deleted

## 2012-11-25 MED ORDER — QUINAPRIL-HYDROCHLOROTHIAZIDE 20-25 MG PO TABS: 1.0000 | ORAL_TABLET | Freq: Every day | ORAL | Status: DC

## 2012-11-25 NOTE — Telephone Encounter (Signed)
[facility] can get accuretic 20/25 mg at no change for pt.  Would you consider changing from Lisinopril/hctz??

## 2012-11-25 NOTE — Telephone Encounter (Signed)
Sent in, can you please call patient and let her know.

## 2013-01-13 ENCOUNTER — Ambulatory Visit: Payer: No Typology Code available for payment source

## 2013-03-15 ENCOUNTER — Encounter: Payer: Self-pay | Admitting: Internal Medicine

## 2013-04-14 ENCOUNTER — Other Ambulatory Visit: Payer: Self-pay

## 2013-04-25 ENCOUNTER — Other Ambulatory Visit: Payer: Self-pay | Admitting: Internal Medicine

## 2013-05-10 ENCOUNTER — Other Ambulatory Visit: Payer: Self-pay | Admitting: Internal Medicine

## 2013-08-19 ENCOUNTER — Telehealth: Payer: Self-pay | Admitting: Internal Medicine

## 2013-08-19 ENCOUNTER — Encounter: Payer: Self-pay | Admitting: Internal Medicine

## 2013-08-19 NOTE — Telephone Encounter (Signed)
Attempted to contact patient this afternoon to get a future appt scheduled.  No answer left detailed message asking her to call back to get one scheduled.  She will be mailed a letter also.

## 2013-08-30 ENCOUNTER — Telehealth: Payer: Self-pay | Admitting: Internal Medicine

## 2013-08-30 NOTE — Telephone Encounter (Signed)
Patient presented to my desk today, because of the letter she received.  Upon receiving insurance she transferred her care to [facility] on [address].  She has been to there location twice.  Patient is not sure if she will have insurance after the end of the month.  She would like to come back if her insurance does not continue.  I ran this information by Tamela Oddi and she informed me the patient would be considered new.  Removing Dr. Sherrine Maples as PCP.

## 2013-10-21 ENCOUNTER — Other Ambulatory Visit: Payer: Self-pay

## 2014-01-20 ENCOUNTER — Telehealth: Payer: Self-pay | Admitting: Dietician

## 2014-01-20 NOTE — Telephone Encounter (Signed)
Patient call was returned and CDE suggested patient call for an appointment as soon as possible either here or at [facility]. Also informed her of insulin available at [facility] without a prescription.

## 2014-01-30 ENCOUNTER — Encounter: Payer: Self-pay | Admitting: Family Medicine

## 2014-01-30 ENCOUNTER — Ambulatory Visit: Payer: Self-pay | Attending: Internal Medicine | Admitting: Family Medicine

## 2014-01-30 VITALS — BP 159/82 | HR 79 | Temp 98.3°F | Resp 16 | Ht 60.0 in | Wt 160.0 lb

## 2014-01-30 DIAGNOSIS — F329 Major depressive disorder, single episode, unspecified: Secondary | ICD-10-CM | POA: Insufficient documentation

## 2014-01-30 DIAGNOSIS — E1165 Type 2 diabetes mellitus with hyperglycemia: Secondary | ICD-10-CM

## 2014-01-30 DIAGNOSIS — E1159 Type 2 diabetes mellitus with other circulatory complications: Secondary | ICD-10-CM

## 2014-01-30 DIAGNOSIS — Z23 Encounter for immunization: Secondary | ICD-10-CM

## 2014-01-30 DIAGNOSIS — E1169 Type 2 diabetes mellitus with other specified complication: Secondary | ICD-10-CM

## 2014-01-30 DIAGNOSIS — E1139 Type 2 diabetes mellitus with other diabetic ophthalmic complication: Secondary | ICD-10-CM

## 2014-01-30 DIAGNOSIS — IMO0002 Reserved for concepts with insufficient information to code with codable children: Secondary | ICD-10-CM

## 2014-01-30 DIAGNOSIS — E785 Hyperlipidemia, unspecified: Secondary | ICD-10-CM | POA: Insufficient documentation

## 2014-01-30 DIAGNOSIS — I10 Essential (primary) hypertension: Secondary | ICD-10-CM

## 2014-01-30 DIAGNOSIS — E119 Type 2 diabetes mellitus without complications: Secondary | ICD-10-CM | POA: Insufficient documentation

## 2014-01-30 LAB — POCT URINALYSIS DIPSTICK
Glucose, UA: 250
Leukocytes, UA: NEGATIVE
Nitrite, UA: NEGATIVE
PH UA: 5.5
Spec Grav, UA: >=1.03
Urobilinogen, UA: 1

## 2014-01-30 LAB — CBC
HEMATOCRIT: 46 % (ref 36.0–46.0)
Hemoglobin: 15.6 g/dL — ABNORMAL HIGH (ref 12.0–15.0)
MCH: 28.2 pg (ref 26.0–34.0)
MCHC: 33.9 g/dL (ref 30.0–36.0)
MCV: 83 fL (ref 78.0–100.0)
MPV: 10.9 fL (ref 8.6–12.4)
PLATELETS: 310 10*3/uL (ref 150–400)
RBC: 5.54 MIL/uL — AB (ref 3.87–5.11)
RDW: 13.6 % (ref 11.5–15.5)
WBC: 5.6 10*3/uL (ref 4.0–10.5)

## 2014-01-30 LAB — COMPLETE METABOLIC PANEL WITH GFR
ALK PHOS: 117 U/L (ref 39–117)
ALT: 20 U/L (ref 0–35)
AST: 14 U/L (ref 0–37)
Albumin: 4.2 g/dL (ref 3.5–5.2)
BUN: 27 mg/dL — AB (ref 6–23)
CALCIUM: 10 mg/dL (ref 8.4–10.5)
CHLORIDE: 100 meq/L (ref 96–112)
CO2: 22 mEq/L (ref 19–32)
Creat: 1.11 mg/dL — ABNORMAL HIGH (ref 0.50–1.10)
GFR, EST NON AFRICAN AMERICAN: 57 mL/min — AB
GFR, Est African American: 66 mL/min
Glucose, Bld: 256 mg/dL — ABNORMAL HIGH (ref 70–99)
Potassium: 3.9 mEq/L (ref 3.5–5.3)
Sodium: 139 mEq/L (ref 135–145)
TOTAL PROTEIN: 6.5 g/dL (ref 6.0–8.3)
Total Bilirubin: 0.4 mg/dL (ref 0.2–1.2)

## 2014-01-30 LAB — GLUCOSE, POCT (MANUAL RESULT ENTRY): POC Glucose: 232 mg/dl — AB (ref 70–99)

## 2014-01-30 LAB — LIPID PANEL
Cholesterol: 303 mg/dL — ABNORMAL HIGH (ref 0–200)
HDL: 54 mg/dL (ref >39)
LDL Cholesterol: 230 mg/dL — ABNORMAL HIGH (ref 0–99)
Total CHOL/HDL Ratio: 5.6 Ratio
Triglycerides: 93 mg/dL (ref <150)
VLDL: 19 mg/dL (ref 0–40)

## 2014-01-30 LAB — POCT GLYCOSYLATED HEMOGLOBIN (HGB A1C): Hemoglobin A1C: 13.3

## 2014-01-30 MED ORDER — AMLODIPINE BESYLATE 10 MG PO TABS: 10.0000 mg | ORAL_TABLET | Freq: Every day | ORAL | Status: DC

## 2014-01-30 MED ORDER — ATORVASTATIN CALCIUM 40 MG PO TABS: 40.0000 mg | ORAL_TABLET | Freq: Every day | ORAL | Status: DC

## 2014-01-30 MED ORDER — GLUCOSE BLOOD VI STRP: 1.0000 | ORAL_STRIP | Freq: Three times a day (TID) | Status: DC

## 2014-01-30 MED ORDER — LISINOPRIL-HYDROCHLOROTHIAZIDE 20-12.5 MG PO TABS: 2.0000 | ORAL_TABLET | Freq: Every day | ORAL | Status: DC

## 2014-01-30 MED ORDER — TRUERESULT BLOOD GLUCOSE W/DEVICE KIT: 1.0000 | PACK | Freq: Three times a day (TID) | Status: DC

## 2014-01-30 MED ORDER — SITAGLIPTIN PHOSPHATE 100 MG PO TABS: 100.0000 mg | ORAL_TABLET | Freq: Every day | ORAL | Status: DC

## 2014-01-30 MED ORDER — INSULIN NPH ISOPHANE & REGULAR (70-30) 100 UNIT/ML ~~LOC~~ SUSP: 40.0000 [IU] | Freq: Two times a day (BID) | SUBCUTANEOUS | Status: DC

## 2014-01-30 MED ORDER — METOPROLOL SUCCINATE ER 100 MG PO TB24: 100.0000 mg | ORAL_TABLET | Freq: Every day | ORAL | Status: DC

## 2014-01-30 MED ORDER — TRUEPLUS LANCETS 28G MISC: 1.0000 | Freq: Three times a day (TID) | Status: DC

## 2014-01-30 MED ORDER — INSULIN SYRINGES (DISPOSABLE) U-100 0.5 ML MISC: 1.0000 | Freq: Two times a day (BID) | Status: AC

## 2014-01-30 MED ORDER — INSULIN SYRINGES (DISPOSABLE) U-100 0.5 ML MISC: 1.0000 | Freq: Two times a day (BID) | Status: DC

## 2014-01-30 MED ORDER — TRAZODONE HCL 50 MG PO TABS: 25.0000 mg | ORAL_TABLET | Freq: Every evening | ORAL | Status: DC | PRN

## 2014-01-30 NOTE — Addendum Note (Signed)
Addended by: Boykin Nearing on: 01/30/2014 01:55 PM   Modules accepted: Orders

## 2014-01-30 NOTE — Progress Notes (Signed)
   Subjective:    Patient ID: Ann Bryan, female    DOB: 09-23-1960, 54 y.o.   MRN: [id number] CC: establish care, DM2 HPI 54 yo F NP:  1. DM2: since 17. Out of insulin and victoza. Can often not afford strips to check CBG. Denies polyuria, polydipsia, GI upset, tingling or numbness in extremities. Admits to blurred vision, worse in L eye. Admits to fatigue. Reported complications include glaucoma and diabetic retinopathy. Patient is not taking timolol because she had not had ophthalmology follow up.   2. HTN: denies current HA, CP, SOB, LE edema. Complaint with  medication therapy (prinzide, norvasc, metoprolol). Denies hx of CAD or tachycardia.   3. Depression: associated with poor sleep associated with chronic dysthymia since adolescence.  Has strong family history of substance absue and depression with suicide in her father. Patient does have history of SI as well. She has been treated in the past with antidepressants. She denies SI currently. She admits to social isolation and a lack of social support. She denies substance abuse. She takes [place] intermittently.   Soc Hx: non smoker Med Hx: DM, HTN, glaucoma, retinopathy Surg Hx: s/p hysterectomy in 2004 for symptomatic uterine fibroids  Review of Systems As per HPI     Objective:   Physical Exam BP 159/82 mmHg  Pulse 79  Temp(Src) 98.3 F (36.8 C) (Oral)  Resp 16  Ht 5' (1.524 m)  Wt 160 lb (72.576 kg)  BMI 31.25 kg/m2  SpO2 100%  LMP 03/13/2002 General appearance: alert, cooperative and no distress Head: Normocephalic, without obvious abnormality, atraumatic Eyes: conjunctivae/corneas clear. PERRL, EOM's intact.  Ears: normal TM's and external ear canals both ears Nose: Nares normal. Septum midline. Mucosa normal. No drainage or sinus tenderness. Throat: lips, mucosa, and tongue normal; teeth and gums normal Lungs: clear to auscultation bilaterally Heart: regular rate and rhythm, S1, S2 normal, no murmur, click, rub  or gallop Extremities: extremities normal, atraumatic, no cyanosis or edema  Diabetic foot exam done     Assessment & Plan:

## 2014-01-30 NOTE — Assessment & Plan Note (Signed)
2. HTN: BP above goal Med: compliance P: Continue norvasc 10 daily  Increase prinzide to 20-12.5, two tabs daily  Continue metoprolol 100 mg daily for now with plan to titrate to off as tolerated since patient w/o CAD hx, depressed, fatigue and BB could impair hypoglycemic awareness

## 2014-01-30 NOTE — Progress Notes (Signed)
  HPI The patient is a 54 YO female with PMH of uncontrolled DM II, HTN, hyperlipidemia who returns for follow up on BP and sugars. She states she has been taking her humulin 70/30 as prescribed and started Victoza as prescribed at last visit. She has been checking her sugars mostly in the mornings. She states that her sugars have been up and down. She is stressed right now and a little depressed. She denies SI/HI. 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 She walk most places and thinks that her exercise is okay. She continues to take her blood pressure medications as prescribed. She is otherwise doing well and not having any new symptoms. No chest pain, shortness of breath, nausea, vomiting, diarrhea. She notes some blurry vision in her eyes. She would like to go back to an eye doctor and followup on that. She denies any fevers or chills at home.  Pt here to establish care for long hx DM Type 2,HTN and Hyperlipidemia,  Pt has been non compliant with taking medication due to lack of transportation States she is using half of prescribed insulin Humulin 70/30 Ran out of Victoza, Lipitor  Denies numb/tingling C/o blurry vision with corrective lenses- Glaucoma noted  CBG,A1c obtained

## 2014-01-30 NOTE — Assessment & Plan Note (Signed)
  1. Diabetes: uncontrolled  Restart humulin 70/30 40 U twice daily  januvia 100 mg once daily Keep track of sugars  Referral to ophthalmology placed  HM: lipids, urine microalbumin, foot exam done today

## 2014-01-30 NOTE — Assessment & Plan Note (Signed)
3. Depression with insomnia: Trazodone 25-50 mg nightly, this will replace Ann Bryan

## 2014-01-30 NOTE — Patient Instructions (Addendum)
Ann Bryan,  Thank you for coming in today. It was a pleasure meeting you. I look forward to being your primary doctor.   1. Diabetes: Restart humulin 70/30 40 U twice daily  januvia 100 mg once daily Keep track of sugars   2. HTN: norvasc 10 daily  Metoprolol 100 mg daily  Increase prinzide to 20-12.5, two tabs daily    3. Depression with insomnia: Trazodone 25-50 mg nightly, this will replace Lorrin Mais   You will be called with lab results. Apply for [place] discount and orange card, and PASS (medication assistance)  F/u with nurse in 3 weeks F/u in with me in 6 weeks for diabetes and HTN.    Dr. Adrian Blackwater

## 2014-01-30 NOTE — Progress Notes (Signed)
CBG log given to pt and educated on use Patient will record AM fasting

## 2014-01-31 ENCOUNTER — Telehealth: Payer: Self-pay | Admitting: *Deleted

## 2014-01-31 LAB — MICROALBUMIN / CREATININE URINE RATIO
Creatinine, Urine: 240.9 mg/dL
Microalb Creat Ratio: 1612.7 mg/g — ABNORMAL HIGH (ref 0.0–30.0)
Microalb, Ur: 388.5 mg/dL — ABNORMAL HIGH (ref <2.0)

## 2014-01-31 NOTE — Telephone Encounter (Signed)
-----   Message from Minerva Ends, MD sent at 01/31/2014 12:53 PM EST ----- Elevated cholesterol, continue lipitor daily  Elevated urine microalbumin, continue lisinopril-HCTZ combo  Normal CBC Normal CMP except for elevated blood sugar,  BUN and Cr slightly elevated, patient to be sure to drink plenty of water and stay well hydrated

## 2014-01-31 NOTE — Telephone Encounter (Signed)
Pt aware of results, advised to continue taking medication as PCP recommended

## 2014-02-17 ENCOUNTER — Ambulatory Visit: Payer: Self-pay

## 2014-03-15 ENCOUNTER — Ambulatory Visit: Payer: Self-pay | Attending: Internal Medicine

## 2014-03-15 ENCOUNTER — Ambulatory Visit: Payer: Self-pay | Attending: Family Medicine

## 2014-03-27 ENCOUNTER — Other Ambulatory Visit: Payer: Self-pay | Admitting: *Deleted

## 2014-03-27 DIAGNOSIS — E1139 Type 2 diabetes mellitus with other diabetic ophthalmic complication: Secondary | ICD-10-CM

## 2014-03-27 DIAGNOSIS — IMO0002 Reserved for concepts with insufficient information to code with codable children: Secondary | ICD-10-CM

## 2014-03-27 DIAGNOSIS — E1165 Type 2 diabetes mellitus with hyperglycemia: Principal | ICD-10-CM

## 2014-03-27 MED ORDER — SITAGLIPTIN PHOSPHATE 100 MG PO TABS: 100.0000 mg | ORAL_TABLET | Freq: Every day | ORAL | Status: DC

## 2014-03-27 MED ORDER — INSULIN NPH ISOPHANE & REGULAR (70-30) 100 UNIT/ML ~~LOC~~ SUSP: 40.0000 [IU] | Freq: Two times a day (BID) | SUBCUTANEOUS | Status: DC

## 2014-04-26 ENCOUNTER — Other Ambulatory Visit: Payer: Self-pay | Admitting: Internal Medicine

## 2014-05-04 ENCOUNTER — Other Ambulatory Visit: Payer: Self-pay | Admitting: Internal Medicine

## 2014-07-03 ENCOUNTER — Other Ambulatory Visit: Payer: Self-pay

## 2014-08-26 ENCOUNTER — Emergency Department (HOSPITAL_COMMUNITY)
Admission: EM | Admit: 2014-08-26 | Discharge: 2014-08-26 | Disposition: A | Discharge status: Home / Self Care | Payer: Self-pay | Attending: Emergency Medicine | Admitting: Emergency Medicine

## 2014-08-26 ENCOUNTER — Encounter (HOSPITAL_COMMUNITY): Payer: Self-pay | Admitting: Emergency Medicine

## 2014-08-26 ENCOUNTER — Emergency Department (HOSPITAL_COMMUNITY): Payer: Self-pay

## 2014-08-26 DIAGNOSIS — E11319 Type 2 diabetes mellitus with unspecified diabetic retinopathy without macular edema: Secondary | ICD-10-CM | POA: Insufficient documentation

## 2014-08-26 DIAGNOSIS — Y9389 Activity, other specified: Secondary | ICD-10-CM | POA: Insufficient documentation

## 2014-08-26 DIAGNOSIS — E1121 Type 2 diabetes mellitus with diabetic nephropathy: Secondary | ICD-10-CM | POA: Insufficient documentation

## 2014-08-26 DIAGNOSIS — I10 Essential (primary) hypertension: Secondary | ICD-10-CM | POA: Insufficient documentation

## 2014-08-26 DIAGNOSIS — Y998 Other external cause status: Secondary | ICD-10-CM | POA: Insufficient documentation

## 2014-08-26 DIAGNOSIS — F329 Major depressive disorder, single episode, unspecified: Secondary | ICD-10-CM | POA: Insufficient documentation

## 2014-08-26 DIAGNOSIS — E785 Hyperlipidemia, unspecified: Secondary | ICD-10-CM | POA: Insufficient documentation

## 2014-08-26 DIAGNOSIS — Y9289 Other specified places as the place of occurrence of the external cause: Secondary | ICD-10-CM | POA: Insufficient documentation

## 2014-08-26 DIAGNOSIS — Z794 Long term (current) use of insulin: Secondary | ICD-10-CM | POA: Insufficient documentation

## 2014-08-26 DIAGNOSIS — Z79899 Other long term (current) drug therapy: Secondary | ICD-10-CM | POA: Insufficient documentation

## 2014-08-26 DIAGNOSIS — S52122A Displaced fracture of head of left radius, initial encounter for closed fracture: Secondary | ICD-10-CM | POA: Insufficient documentation

## 2014-08-26 DIAGNOSIS — W01198A Fall on same level from slipping, tripping and stumbling with subsequent striking against other object, initial encounter: Secondary | ICD-10-CM | POA: Insufficient documentation

## 2014-08-26 MED ORDER — IBUPROFEN 800 MG PO TABS: 800.0000 mg | ORAL_TABLET | Freq: Three times a day (TID) | ORAL | Status: DC

## 2014-08-26 MED ORDER — IBUPROFEN 400 MG PO TABS
800.0000 mg | ORAL_TABLET | Freq: Once | ORAL | Status: AC
  Administered 2014-08-26: 800 mg via ORAL
  Filled 2014-08-26: qty 2

## 2014-08-26 NOTE — ED Notes (Signed)
Declined W/C at D/C and was escorted to lobby by RN. 

## 2014-08-26 NOTE — ED Provider Notes (Signed)
CSN: [id number]     Arrival date & time 08/26/14  1046 History  This chart was scribed for non-physician practitioner, Harlene Ramus, PA-C working with Ezequiel Essex, MD by Judithann Sauger, ED Scribe. The patient was seen in room TR08C/TR08C and the patient's care was started at 11:08 AM      Chief Complaint  Patient presents with  . Fall  . Elbow Injury   The history is provided by the patient. No language interpreter was used.   HPI Comments: Ann Bryan is a 54 y.o. female who presents to the Emergency Department status post fall that occurred last night around 9 pm when she slipped on wet floor at [facility] a. She explains that she fell straight back hitting the back of her head and left elbow. She reports left elbow swelling and pain that intermittently radiates distally. She denies LOC, HA, visual changes, numbness, tingling, weakness. She denies any previous injury or surgery on the left elbow. She states that she took Benadryl last night to try to sleep but nothing specifically for the elbow pain.   Past Medical History  Diagnosis Date  . Depression   . Hyperlipidemia   . Hypertension   . Diabetic retinopathy   . Diabetic nephropathy   . Diabetes mellitus 1986     poorly controlled last hemoglobin A1c  10.5 on January 01, 2010, increase since June 2011 when hemoglobin A1c was 9.3 , determined to be most likely secondary to non-compliance.   Past Surgical History  Procedure Laterality Date  . Abdominal hysterectomy   2004   Family History  Problem Relation Age of Onset  . Diabetes Mother   . Stroke Mother   . Hypertension Mother   . Alcohol abuse Mother   . Depression Father     Committed suicide  . Alcohol abuse Maternal Uncle    Social History  Substance Use Topics  . Smoking status: Never Smoker   . Smokeless tobacco: Never Used  . Alcohol Use: No   OB History    No data available     Review of Systems  Musculoskeletal: Positive for joint swelling.        Left elbow pain.  All other systems reviewed and are negative.     Allergies  Doxycycline  Home Medications   Prior to Admission medications   Medication Sig Start Date End Date Taking? Authorizing Provider  amLODipine (NORVASC) 10 MG tablet Take 1 tablet (10 mg total) by mouth daily. 01/30/14 04/08/15  Josalyn Funches, MD  atorvastatin (LIPITOR) 40 MG tablet Take 1 tablet (40 mg total) by mouth daily. 01/30/14   Josalyn Funches, MD  Blood Glucose Monitoring Suppl (TRUERESULT BLOOD GLUCOSE) W/DEVICE KIT 1 each by Does not apply route 3 (three) times daily before meals. 01/30/14   Josalyn Funches, MD  glucose blood (TRUETEST TEST) test strip 1 each by Other route 3 (three) times daily. Use as instructed 01/30/14   Boykin Nearing, MD  insulin NPH-regular Human (NOVOLIN 70/30) (70-30) 100 UNIT/ML injection Inject 40 Units into the skin 2 (two) times daily with a meal. 40 U before breakfast, 35 U at night 03/27/14   Josalyn Funches, MD  Insulin Syringes, Disposable, U-100 0.5 ML MISC 1 each by Does not apply route 2 (two) times daily. 01/30/14   Boykin Nearing, MD  Liraglutide 18 MG/3ML SOPN Inject 1.2 mLs into the skin daily. Patient not taking: Reported on 01/30/2014 11/23/12   Olga Millers, MD  lisinopril-hydrochlorothiazide (ZESTORETIC) 20-12.5  MG per tablet Take 2 tablets by mouth daily. 01/30/14   Josalyn Funches, MD  metoprolol succinate (TOPROL-XL) 100 MG 24 hr tablet Take 1 tablet (100 mg total) by mouth daily. 01/30/14 04/08/15  Josalyn Funches, MD  sitaGLIPtin (JANUVIA) 100 MG tablet Take 1 tablet (100 mg total) by mouth daily. 03/27/14   Josalyn Funches, MD  timolol (BETIMOL) 0.5 % ophthalmic solution Place 1 drop into both eyes daily. Patient not taking: Reported on 01/30/2014 11/23/12   Olga Millers, MD  traZODone (DESYREL) 50 MG tablet Take 0.5-1 tablets (25-50 mg total) by mouth at bedtime as needed for sleep. 01/30/14   Boykin Nearing, MD  TRUEPLUS LANCETS 28G MISC 1 each by  Does not apply route 3 (three) times daily. 01/30/14   Boykin Nearing, MD  zolpidem (AMBIEN) 5 MG tablet Take 1 tablet (5 mg total) by mouth at bedtime as needed for sleep. 11/23/12   Olga Millers, MD   BP 165/60 mmHg  Pulse 74  Temp(Src) 98.7 F (37.1 C) (Oral)  Resp 20  Ht 5' (1.524 m)  Wt 170 lb (77.111 kg)  BMI 33.20 kg/m2  SpO2 97%  LMP 03/13/2002 Physical Exam  Constitutional: She is oriented to person, place, and time. She appears well-developed and well-nourished. No distress.  HENT:  Head: Normocephalic and atraumatic. Head is without raccoon's eyes, without Battle's sign, without abrasion, without contusion and without laceration.  Right Ear: Tympanic membrane and external ear normal. No hemotympanum.  Left Ear: Tympanic membrane and external ear normal. No hemotympanum.  Nose: Nose normal. No nasal septal hematoma.  Mouth/Throat: Oropharynx is clear and moist.  Eyes: Conjunctivae and EOM are normal.  Neck: Normal range of motion. Neck supple.  Cardiovascular: Normal rate.   Pulmonary/Chest: Effort normal. No respiratory distress.  Musculoskeletal:       Left elbow: She exhibits decreased range of motion and swelling. She exhibits no deformity and no laceration. No tenderness found.  Pt unable to fully extend left elbow due to pain. Full ROM of left shoulder, wrist and fingers. 2+ radial pulse.   Neurological: She is alert and oriented to person, place, and time. She has normal strength and normal reflexes. No cranial nerve deficit or sensory deficit. Gait normal.  Skin: Skin is warm and dry.  Psychiatric: She has a normal mood and affect. Her behavior is normal.  Nursing note and vitals reviewed.   ED Course  Procedures (including critical care time) DIAGNOSTIC STUDIES: Oxygen Saturation is 97% on RA, normal by my interpretation.    COORDINATION OF CARE: 11:19 AM- Pt advised of plan for treatment and pt agrees.    Labs Review Labs Reviewed - No data to  display  Imaging Review No results found. I have personally reviewed and evaluated these images and lab results as part of my medical decision-making.  Filed Vitals:   08/26/14 1051  BP: 165/60  Pulse: 74  Temp: 98.7 F (37.1 C)  Resp: 20     MDM   Final diagnoses:  Radial head fracture, left, closed, initial encounter    Pt presents s/p fall with left elbow pain and swelling, no LOC. Mild swelling noted to posterior elbow, decreased ROM.   xray ordered to evaluate for any fractures.   Pain given Ibuprofen for pain control.  Pt reports pain has improved.  Xray showed 2 sites of potentially nondisplaced fx (radial head and coronoid process). Posterior splint and sling will be placed on pt's left arm.  Discussed results  and plan for d/c with pt. Pt advised to rest, ice and elevate arm. Pt given rx for ibuprofen for pain control. Advised pt to follow up with ortho, contact information given. Pt agrees with plan.  I personally performed the services described in this documentation, which was scribed in my presence. The recorded information has been reviewed and is accurate.  Meds given in ED:  Medications  ibuprofen (ADVIL,MOTRIN) tablet 800 mg (800 mg Oral Given 08/26/14 1217)    New Prescriptions   IBUPROFEN (ADVIL,MOTRIN) 800 MG TABLET    Take 1 tablet (800 mg total) by mouth 3 (three) times daily.      Zemira Zehring [place], [place] 08/26/14 [place], MD 08/26/14 [phone]

## 2014-08-26 NOTE — ED Notes (Signed)
Onset one day ago tripped and fell onto left elbow. Pain at rest 3/10 achy dull and with certain movement pain increases to 10/10 sharp. Full sensation radial pulse +2.

## 2014-08-26 NOTE — Progress Notes (Signed)
Orthopedic Tech Progress Note Patient Details:  Ann Bryan 08/13/1960 [id number]  Ortho Devices Type of Ortho Device: Ace wrap, Arm sling, Long arm splint Ortho Device/Splint Location: lue Ortho Device/Splint Interventions: Application   [name] 08/26/2014, 2:20 PM

## 2014-10-20 ENCOUNTER — Ambulatory Visit: Payer: Self-pay

## 2014-10-20 ENCOUNTER — Ambulatory Visit: Payer: Self-pay | Attending: Family Medicine | Admitting: Family Medicine

## 2014-10-20 ENCOUNTER — Encounter: Payer: Self-pay | Admitting: Family Medicine

## 2014-10-20 VITALS — BP 164/84 | HR 106 | Temp 98.5°F | Resp 16 | Ht 60.0 in | Wt 179.0 lb

## 2014-10-20 DIAGNOSIS — Z794 Long term (current) use of insulin: Secondary | ICD-10-CM | POA: Insufficient documentation

## 2014-10-20 DIAGNOSIS — Z0000 Encounter for general adult medical examination without abnormal findings: Secondary | ICD-10-CM

## 2014-10-20 DIAGNOSIS — IMO0002 Reserved for concepts with insufficient information to code with codable children: Secondary | ICD-10-CM

## 2014-10-20 DIAGNOSIS — F341 Dysthymic disorder: Secondary | ICD-10-CM | POA: Insufficient documentation

## 2014-10-20 DIAGNOSIS — S52122A Displaced fracture of head of left radius, initial encounter for closed fracture: Secondary | ICD-10-CM | POA: Insufficient documentation

## 2014-10-20 DIAGNOSIS — E1165 Type 2 diabetes mellitus with hyperglycemia: Secondary | ICD-10-CM | POA: Insufficient documentation

## 2014-10-20 DIAGNOSIS — E1136 Type 2 diabetes mellitus with diabetic cataract: Secondary | ICD-10-CM

## 2014-10-20 DIAGNOSIS — S52122D Displaced fracture of head of left radius, subsequent encounter for closed fracture with routine healing: Secondary | ICD-10-CM

## 2014-10-20 DIAGNOSIS — Z79899 Other long term (current) drug therapy: Secondary | ICD-10-CM | POA: Insufficient documentation

## 2014-10-20 DIAGNOSIS — E11311 Type 2 diabetes mellitus with unspecified diabetic retinopathy with macular edema: Secondary | ICD-10-CM

## 2014-10-20 DIAGNOSIS — E1159 Type 2 diabetes mellitus with other circulatory complications: Secondary | ICD-10-CM

## 2014-10-20 DIAGNOSIS — I10 Essential (primary) hypertension: Secondary | ICD-10-CM | POA: Insufficient documentation

## 2014-10-20 DIAGNOSIS — Z23 Encounter for immunization: Secondary | ICD-10-CM | POA: Insufficient documentation

## 2014-10-20 DIAGNOSIS — E1139 Type 2 diabetes mellitus with other diabetic ophthalmic complication: Secondary | ICD-10-CM | POA: Insufficient documentation

## 2014-10-20 LAB — POCT GLYCOSYLATED HEMOGLOBIN (HGB A1C): Hemoglobin A1C: 11.4

## 2014-10-20 LAB — GLUCOSE, POCT (MANUAL RESULT ENTRY): POC GLUCOSE: 106 mg/dL — AB (ref 70–99)

## 2014-10-20 MED ORDER — INSULIN NPH ISOPHANE & REGULAR (70-30) 100 UNIT/ML ~~LOC~~ SUSP: 40.0000 [IU] | Freq: Two times a day (BID) | SUBCUTANEOUS | Status: DC

## 2014-10-20 MED ORDER — LIRAGLUTIDE 18 MG/3ML ~~LOC~~ SOPN: 1.8000 mg | PEN_INJECTOR | Freq: Every day | SUBCUTANEOUS | Status: DC

## 2014-10-20 MED ORDER — GLUCOSE BLOOD VI STRP: 1.0000 | ORAL_STRIP | Freq: Three times a day (TID) | Status: DC

## 2014-10-20 MED ORDER — ZOLPIDEM TARTRATE 10 MG PO TABS: 10.0000 mg | ORAL_TABLET | Freq: Every evening | ORAL | Status: DC | PRN

## 2014-10-20 MED ORDER — SITAGLIPTIN PHOSPHATE 100 MG PO TABS: 100.0000 mg | ORAL_TABLET | Freq: Every day | ORAL | Status: DC

## 2014-10-20 MED ORDER — METOPROLOL SUCCINATE ER 100 MG PO TB24: 100.0000 mg | ORAL_TABLET | Freq: Every day | ORAL | Status: DC

## 2014-10-20 MED ORDER — ATORVASTATIN CALCIUM 40 MG PO TABS: 40.0000 mg | ORAL_TABLET | Freq: Every day | ORAL | Status: DC

## 2014-10-20 MED ORDER — TRUE METRIX METER W/DEVICE KIT: 1.0000 | PACK | Status: DC | PRN

## 2014-10-20 MED ORDER — LISINOPRIL-HYDROCHLOROTHIAZIDE 20-12.5 MG PO TABS: 2.0000 | ORAL_TABLET | Freq: Every day | ORAL | Status: DC

## 2014-10-20 MED ORDER — AMLODIPINE BESYLATE 10 MG PO TABS: 10.0000 mg | ORAL_TABLET | Freq: Every day | ORAL | Status: DC

## 2014-10-20 NOTE — Assessment & Plan Note (Signed)
A: diabetes remains uncontrolled Med: intermittently compliant P: Max out victoza Refilled all other meds at current dose

## 2014-10-20 NOTE — Assessment & Plan Note (Signed)
Refuses treatment and therapy other than ambien for sleep

## 2014-10-20 NOTE — Patient Instructions (Addendum)
Ann Bryan was seen today for follow-up, hypertension and diabetes.  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 -     Ambulatory referral to Ophthalmology -     Liraglutide 18 MG/3ML SOPN; Inject 0.3 mLs (1.8 mg total) into the skin daily. -     atorvastatin (LIPITOR) 40 MG tablet; Take 1 tablet (40 mg total) by mouth daily. -     glucose blood (TRUETEST TEST) test strip; 1 each by Other route 3 (three) times daily. Use as instructed -     insulin NPH-regular Human (NOVOLIN 70/30) (70-30) 100 UNIT/ML injection; Inject 40 Units into the skin 2 (two) times daily with a meal.  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 -     metoprolol succinate (TOPROL-XL) 100 MG 24 hr tablet; Take 1 tablet (100 mg total) by mouth daily. -     amLODipine (NORVASC) 10 MG tablet; Take 1 tablet (10 mg total) by mouth daily.  Early onset dysthymia -     zolpidem (AMBIEN) 10 MG tablet; Take 1 tablet (10 mg total) by mouth at bedtime as needed for sleep.  Essential hypertension  Left radial head fracture, closed, with routine healing, subsequent encounter -     DG Elbow Complete Left; Future  Healthcare maintenance -     MM DIGITAL SCREENING BILATERAL; Future   F/u in 4 weeks with pharmacy for BP and CBG check   F.u with me in 3 months Dr. Adrian Blackwater

## 2014-10-20 NOTE — Progress Notes (Signed)
 Subjective:  Patient ID: Ann Bryan, female    DOB: 04/17/1960  Age: 53 y.o. MRN: [id number]  CC: Follow-up; Hypertension; and Diabetes   HPI Ann Bryan presents for   1. CHRONIC DIABETES  Disease Monitoring  Blood Sugar Ranges: 80-400  Polyuria: no   Visual problems: no, still has blurry vision    Medication Compliance: no, intermittent   Medication Side Effects  Hypoglycemia: no, lowest is 70   Preventitive Health Care  Eye Exam: due   Foot Exam: done today   Diet pattern: regular meals   Exercise: yes, minimal moderated   2. CHRONIC HYPERTENSION  Disease Monitoring  Blood pressure range: not checking   Chest pain: no   Dyspnea: no   Claudication: no   Medication compliance: yes,  But out of metoprolol for over a month   Medication Side Effects  Lightheadedness: sometimes    Urinary frequency: no   Edema: no     3. Fall: 08/25/2014 at [employer]. Slip and fall with head trauma and L radial headfracture. Went to ED the following day.  Dizziness off and on from 08/28/2014 for two weeks.  No dizziness now. No headache. Has not seen ortho. No L elbow pain or swelling.   4. Mood disorder: anxiety, depression, insomnia. Chronic. Patient refuses mental health. Requesting ambien 10 mg for sleep. Indicated she contemplates suicide. Denies that she will perform suicide.    Social History  Substance Use Topics  . Smoking status: Never Smoker   . Smokeless tobacco: Never Used  . Alcohol Use: No    Outpatient Prescriptions Prior to Visit  Medication Sig Dispense Refill  . amLODipine (NORVASC) 10 MG tablet Take 1 tablet (10 mg total) by mouth daily. 90 tablet 1  . atorvastatin (LIPITOR) 40 MG tablet Take 1 tablet (40 mg total) by mouth daily. 90 tablet 3  . Blood Glucose Monitoring Suppl (TRUERESULT BLOOD GLUCOSE) W/DEVICE KIT 1 each by Does not apply route 3 (three) times daily before meals. 1 each 0  . glucose blood (TRUETEST TEST) test strip 1 each by Other  route 3 (three) times daily. Use as instructed 100 each 12  . ibuprofen (ADVIL,MOTRIN) 800 MG tablet Take 1 tablet (800 mg total) by mouth 3 (three) times daily. 21 tablet 0  . insulin NPH-regular Human (NOVOLIN 70/30) (70-30) 100 UNIT/ML injection Inject 40 Units into the skin 2 (two) times daily with a meal. 40 U before breakfast, 35 U at night 90 mL 3  . Insulin Syringes, Disposable, U-100 0.5 ML MISC 1 each by Does not apply route 2 (two) times daily. 100 each 8  . Liraglutide 18 MG/3ML SOPN Inject 1.2 mLs into the skin daily. (Patient not taking: Reported on 01/30/2014) 30 mL 12  . lisinopril-hydrochlorothiazide (ZESTORETIC) 20-12.5 MG per tablet Take 2 tablets by mouth daily. 180 tablet 1  . metoprolol succinate (TOPROL-XL) 100 MG 24 hr tablet Take 1 tablet (100 mg total) by mouth daily. 90 tablet 0  . sitaGLIPtin (JANUVIA) 100 MG tablet Take 1 tablet (100 mg total) by mouth daily. 90 tablet 3  . timolol (BETIMOL) 0.5 % ophthalmic solution Place 1 drop into both eyes daily. (Patient not taking: Reported on 01/30/2014) 10 mL 3  . traZODone (DESYREL) 50 MG tablet Take 0.5-1 tablets (25-50 mg total) by mouth at bedtime as needed for sleep. 30 tablet 3  . TRUEPLUS LANCETS 28G MISC 1 each by Does not apply route 3 (three) times daily. 100 each 12  .   zolpidem (AMBIEN) 5 MG tablet Take 1 tablet (5 mg total) by mouth at bedtime as needed for sleep. 30 tablet 3   No facility-administered medications prior to visit.    ROS Review of Systems  Constitutional: Negative for fever and chills.  Eyes: Negative for visual disturbance.  Respiratory: Negative for shortness of breath.   Cardiovascular: Negative for chest pain.  Gastrointestinal: Negative for abdominal pain and blood in stool.  Musculoskeletal: Positive for gait problem (R foot plantar fascitis ). Negative for back pain and arthralgias.  Skin: Negative for rash.  Allergic/Immunologic: Negative for immunocompromised state.  Hematological:  Negative for adenopathy. Does not bruise/bleed easily.  Psychiatric/Behavioral: Positive for dysphoric mood. Negative for suicidal ideas. The patient is nervous/anxious.   GAD-7: score of 21. 3s down the line.   Objective:  BP 164/84 mmHg  Pulse 106  Temp(Src) 98.5 F (36.9 C) (Oral)  Resp 16  Ht 5' (1.524 m)  Wt 179 lb (81.194 kg)  BMI 34.96 kg/m2  SpO2 98%  LMP 03/13/2002  BP/Weight 10/20/2014 08/26/2014 01/30/2014  Systolic BP 164 143 159  Diastolic BP 84 62 82  Wt. (Lbs) 179 170 160  BMI 34.96 33.2 31.25   Physical Exam  Constitutional: She is oriented to person, place, and time. She appears well-developed and well-nourished. No distress.  HENT:  Head: Normocephalic and atraumatic.  Cardiovascular: Normal rate, regular rhythm, normal heart sounds and intact distal pulses.   Pulmonary/Chest: Effort normal and breath sounds normal.  Musculoskeletal: She exhibits no edema.       Left elbow: She exhibits normal range of motion, no swelling, no effusion, no deformity and no laceration. No tenderness found.  Wearing soft cast on L arm Cast removed exam normal   Neurological: She is alert and oriented to person, place, and time.  Skin: Skin is warm and dry. No rash noted.  Psychiatric: She has a normal mood and affect.    Lab Results  Component Value Date   HGBA1C 11.40 10/20/2014   Lab Results  Component Value Date   HGBA1C 13.30 01/30/2014    CBG 106 Assessment & Plan:   Problem List Items Addressed This Visit    DM (diabetes mellitus) type II uncontrolled with eye manifestation (HCC) - Primary (Chronic)   Relevant Medications   lisinopril-hydrochlorothiazide (ZESTORETIC) 20-12.5 MG tablet   Liraglutide 18 MG/3ML SOPN   atorvastatin (LIPITOR) 40 MG tablet   insulin NPH-regular Human (NOVOLIN 70/30) (70-30) 100 UNIT/ML injection   sitaGLIPtin (JANUVIA) 100 MG tablet   Blood Glucose Monitoring Suppl (TRUE METRIX METER) W/DEVICE KIT   glucose blood (TRUE METRIX  BLOOD GLUCOSE TEST) test strip   Other Relevant Orders   Ambulatory referral to Ophthalmology   POCT glucose (manual entry) (Completed)   POCT glycosylated hemoglobin (Hb A1C) (Completed)   Microalbumin/Creatinine Ratio, Urine   Early onset dysthymia (Chronic)   Relevant Medications   zolpidem (AMBIEN) 10 MG tablet   Hypertension associated with diabetes (HCC) (Chronic)   Relevant Medications   lisinopril-hydrochlorothiazide (ZESTORETIC) 20-12.5 MG tablet   metoprolol succinate (TOPROL-XL) 100 MG 24 hr tablet   amLODipine (NORVASC) 10 MG tablet   Liraglutide 18 MG/3ML SOPN   atorvastatin (LIPITOR) 40 MG tablet   insulin NPH-regular Human (NOVOLIN 70/30) (70-30) 100 UNIT/ML injection   sitaGLIPtin (JANUVIA) 100 MG tablet   Left radial head fracture   Relevant Orders   DG Elbow Complete Left    Other Visit Diagnoses    Essential hypertension          Relevant Medications    lisinopril-hydrochlorothiazide (ZESTORETIC) 20-12.5 MG tablet    metoprolol succinate (TOPROL-XL) 100 MG 24 hr tablet    amLODipine (NORVASC) 10 MG tablet    atorvastatin (LIPITOR) 40 MG tablet    Healthcare maintenance        Relevant Orders    MM DIGITAL SCREENING BILATERAL    Flu Vaccine QUAD 36+ mos IM (Completed)       No orders of the defined types were placed in this encounter.    Follow-up: No Follow-up on file.   Boykin Nearing MD

## 2014-10-20 NOTE — Progress Notes (Signed)
F/U DM HTN, Not fasting today  No Metoprolol x 1 month Glucose running average fasting 230 No hx tobacco  Ha, pain scale 4

## 2014-10-21 LAB — MICROALBUMIN / CREATININE URINE RATIO
Creatinine, Urine: 39.5 mg/dL
MICROALB UR: 58.6 mg/dL — AB (ref <2.0)
Microalb Creat Ratio: 1483.5 mg/g — ABNORMAL HIGH (ref 0.0–30.0)

## 2014-10-23 ENCOUNTER — Ambulatory Visit: Payer: Self-pay | Attending: Family Medicine

## 2014-11-20 ENCOUNTER — Telehealth: Payer: Self-pay | Admitting: Family Medicine

## 2014-11-20 NOTE — Telephone Encounter (Signed)
Patient called stating that she has a lot of congestion and believes she has a cold, patient would like to have Zyrtec prescribed. Please f/u with pt.

## 2014-11-22 ENCOUNTER — Other Ambulatory Visit: Payer: Self-pay | Admitting: *Deleted

## 2014-11-22 MED ORDER — CETIRIZINE HCL 10 MG PO TABS: 10.0000 mg | ORAL_TABLET | Freq: Every day | ORAL | Status: DC

## 2014-11-22 NOTE — Telephone Encounter (Signed)
Rx send to [place] Pt aware

## 2015-01-12 MED FILL — ?ATORVASTATIN 40MG TABLET: 40 | 30 days supply | Qty: 30 | Fill #6

## 2015-01-12 MED FILL — AMLODIPINE BESYLATE 10 MG T: 10 | 30 days supply | Qty: 30 | Fill #1

## 2015-01-12 MED FILL — !VICTOZA 18MG/3ML INJECT: 18 | 30 days supply | Qty: 6 | Fill #2

## 2015-01-12 MED FILL — ?CETIRIZINE HCL 10 MG TABLE: 10 | 30 days supply | Qty: 30 | Fill #1

## 2015-01-12 MED FILL — $JANUVIA 100 MG TABLET: 100 | 30 days supply | Qty: 30 | Fill #1

## 2015-01-12 MED FILL — $novoLIN 70/30 100 UNITS/ML: (70-30) 100 | 27 days supply | Qty: 20 | Fill #6

## 2015-01-12 MED FILL — LISINOPRIL-HCTZ 20-12.5 MG: 20-12.5 | 30 days supply | Qty: 60 | Fill #5

## 2015-01-12 MED FILL — METOPROLOL SUCC ER 50 MG TA: 50 | 30 days supply | Qty: 60 | Fill #2

## 2015-01-24 ENCOUNTER — Other Ambulatory Visit: Payer: Self-pay | Admitting: Internal Medicine

## 2015-01-24 DIAGNOSIS — Z794 Long term (current) use of insulin: Principal | ICD-10-CM

## 2015-01-24 DIAGNOSIS — E1136 Type 2 diabetes mellitus with diabetic cataract: Secondary | ICD-10-CM

## 2015-01-24 DIAGNOSIS — E1165 Type 2 diabetes mellitus with hyperglycemia: Principal | ICD-10-CM

## 2015-01-24 DIAGNOSIS — IMO0002 Reserved for concepts with insufficient information to code with codable children: Secondary | ICD-10-CM

## 2015-01-24 MED ORDER — LIRAGLUTIDE 18 MG/3ML ~~LOC~~ SOPN
1.8000 mg | PEN_INJECTOR | Freq: Every day | SUBCUTANEOUS | Status: DC
Start: 2015-01-24 — End: 2015-01-24

## 2015-01-24 MED ORDER — LIRAGLUTIDE 18 MG/3ML ~~LOC~~ SOPN: 1.8000 mg | PEN_INJECTOR | Freq: Every day | SUBCUTANEOUS | Status: DC

## 2015-02-09 MED FILL — !VICTOZA 18MG/3ML INJECT: 18 | 20 days supply | Qty: 6 | Fill #3

## 2015-02-09 MED FILL — $novoLIN 70/30 100 UNITS/ML: (70-30) 100 | 27 days supply | Qty: 20 | Fill #7

## 2015-03-08 ENCOUNTER — Other Ambulatory Visit: Payer: Self-pay | Admitting: Family Medicine

## 2015-03-08 MED FILL — $novoLIN 70/30 100 UNITS/ML: (70-30) 100 | 27 days supply | Qty: 20 | Fill #8

## 2015-03-08 MED FILL — LISINOPRIL-HCTZ 20-12.5 MG: 20-12.5 | 30 days supply | Qty: 60 | Fill #0

## 2015-03-08 MED FILL — !VICTOZA 18MG/3ML INJECT: 18 | 20 days supply | Qty: 6 | Fill #4

## 2015-03-08 MED FILL — ?ATORVASTATIN 40MG TABLET: 40 | 30 days supply | Qty: 30 | Fill #0

## 2015-03-08 MED FILL — ?AMLODIPINE BESYLATE 10 MG: 10 | 30 days supply | Qty: 30 | Fill #2

## 2015-03-12 MED FILL — METOPROLOL SUCC ER 50 MG TA: 50 | 30 days supply | Qty: 60 | Fill #0

## 2015-03-27 MED FILL — ?CETIRIZINE HCL 10 MG TABLE: 10 | 30 days supply | Qty: 30 | Fill #2

## 2015-03-27 MED FILL — $JANUVIA 100 MG TABLET: 100 | 30 days supply | Qty: 30 | Fill #2

## 2015-05-08 MED FILL — !VICTOZA 18MG/3ML INJECT: 18 | 20 days supply | Qty: 6 | Fill #5

## 2015-05-08 MED FILL — $novoLIN 70/30 100 UNITS/ML: (70-30) 100 | 24 days supply | Qty: 20 | Fill #1

## 2015-05-08 MED FILL — ?ATORVASTATIN 40MG TABLET: 40 | 30 days supply | Qty: 30 | Fill #1

## 2015-05-08 MED FILL — ?AMLODIPINE BESYLATE 10 MG: 10 | 30 days supply | Qty: 30 | Fill #3

## 2015-05-24 MED FILL — $JANUVIA 100 MG TABLET: 100 | 30 days supply | Qty: 30 | Fill #3

## 2015-06-13 MED FILL — !VICTOZA 18MG/3ML INJECT: 18 | 20 days supply | Qty: 6 | Fill #6

## 2015-06-13 MED FILL — ?ATORVASTATIN 40MG TABLET: 40 | 30 days supply | Qty: 30 | Fill #2

## 2015-06-13 MED FILL — $novoLIN 70/30 100 UNITS/ML: (70-30) 100 | 24 days supply | Qty: 20 | Fill #2

## 2015-06-13 MED FILL — ?AMLODIPINE BESYLATE 10 MG: 10 | 30 days supply | Qty: 30 | Fill #4

## 2015-06-13 MED FILL — LISINOPRIL-HCTZ 20-12.5 MG: 20-12.5 | 30 days supply | Qty: 60 | Fill #1

## 2015-07-18 MED FILL — LISINOPRIL-HCTZ 20-12.5 MG: 20-12.5 | 30 days supply | Qty: 60 | Fill #2

## 2015-07-18 MED FILL — AMLODIPINE BESYLATE 10 MG T: 10 | 30 days supply | Qty: 30 | Fill #5

## 2015-07-18 MED FILL — $JANUVIA 100 MG TABLET: 100 | 30 days supply | Qty: 30 | Fill #4

## 2015-07-18 MED FILL — $VICTOZA 2-PAK 18MG/3ML PEN: 18 | 30 days supply | Qty: 6 | Fill #7

## 2015-07-18 MED FILL — $novoLIN 70/30 100 UNITS/ML: (70-30) 100 | 24 days supply | Qty: 20 | Fill #3

## 2015-07-18 MED FILL — ?ATORVASTATIN 40MG TABLET: 40 | 30 days supply | Qty: 30 | Fill #3

## 2015-07-25 ENCOUNTER — Ambulatory Visit: Payer: Self-pay

## 2015-07-27 ENCOUNTER — Ambulatory Visit: Payer: Self-pay

## 2015-08-17 ENCOUNTER — Ambulatory Visit: Payer: Self-pay | Attending: Family Medicine

## 2015-08-24 ENCOUNTER — Other Ambulatory Visit: Payer: Self-pay | Admitting: Family Medicine

## 2015-08-24 DIAGNOSIS — E1159 Type 2 diabetes mellitus with other circulatory complications: Secondary | ICD-10-CM

## 2015-08-24 DIAGNOSIS — I10 Essential (primary) hypertension: Principal | ICD-10-CM

## 2015-08-24 MED FILL — $novoLIN 70/30 100 UNITS/ML: (70-30) 100 | 24 days supply | Qty: 20 | Fill #4

## 2015-08-24 MED FILL — $VICTOZA 2-PAK 18MG/3ML PEN: 18 | 30 days supply | Qty: 6 | Fill #8

## 2015-08-27 ENCOUNTER — Other Ambulatory Visit: Payer: Self-pay | Admitting: Family Medicine

## 2015-08-27 DIAGNOSIS — I10 Essential (primary) hypertension: Principal | ICD-10-CM

## 2015-08-27 DIAGNOSIS — E1159 Type 2 diabetes mellitus with other circulatory complications: Secondary | ICD-10-CM

## 2015-08-27 DIAGNOSIS — I152 Hypertension secondary to endocrine disorders: Secondary | ICD-10-CM

## 2015-09-07 MED FILL — LISINOPRIL-HCTZ 20-12.5 MG: 20-12.5 | 30 days supply | Qty: 60 | Fill #3

## 2015-09-07 MED FILL — $JANUVIA 100 MG TABLET: 100 | 30 days supply | Qty: 30 | Fill #5

## 2015-09-07 MED FILL — ?ATORVASTATIN 40MG TABLET: 40 | 30 days supply | Qty: 30 | Fill #4

## 2015-09-07 MED FILL — ?AMLODIPINE BESYLATE 10 MG: 10 | 30 days supply | Qty: 30 | Fill #0

## 2015-10-01 MED FILL — $VICTOZA 2-PAK 18MG/3ML PEN: 18 | 30 days supply | Qty: 6 | Fill #9

## 2015-10-01 MED FILL — $novoLIN 70/30 100 UNITS/ML: (70-30) 100 | 24 days supply | Qty: 20 | Fill #5

## 2015-10-16 ENCOUNTER — Other Ambulatory Visit: Payer: Self-pay | Admitting: Family Medicine

## 2015-10-16 DIAGNOSIS — E1159 Type 2 diabetes mellitus with other circulatory complications: Secondary | ICD-10-CM

## 2015-10-16 DIAGNOSIS — I10 Essential (primary) hypertension: Principal | ICD-10-CM

## 2015-10-16 DIAGNOSIS — I152 Hypertension secondary to endocrine disorders: Secondary | ICD-10-CM

## 2015-10-16 MED FILL — LISINOPRIL-HCTZ 20-12.5 MG: 20-12.5 | 30 days supply | Qty: 60 | Fill #4

## 2015-10-16 MED FILL — $JANUVIA 100 MG TABLET: 100 | 28 days supply | Qty: 28 | Fill #6

## 2015-10-16 MED FILL — ATORVASTATIN 40 MG TABLET: 40 | 30 days supply | Qty: 30 | Fill #5

## 2015-10-26 ENCOUNTER — Ambulatory Visit: Payer: Self-pay | Attending: Family Medicine

## 2015-10-30 ENCOUNTER — Telehealth: Payer: Self-pay | Admitting: Family Medicine

## 2015-10-30 DIAGNOSIS — IMO0002 Reserved for concepts with insufficient information to code with codable children: Secondary | ICD-10-CM

## 2015-10-30 DIAGNOSIS — E1136 Type 2 diabetes mellitus with diabetic cataract: Secondary | ICD-10-CM

## 2015-10-30 DIAGNOSIS — E1165 Type 2 diabetes mellitus with hyperglycemia: Principal | ICD-10-CM

## 2015-10-30 DIAGNOSIS — Z794 Long term (current) use of insulin: Principal | ICD-10-CM

## 2015-10-30 NOTE — Telephone Encounter (Signed)
Patient called the office to request medication refill for insulin NPH-regular Human (NOVOLIN 70/30) (70-30) 100 UNIT/ML injection and victoza. Please call prescription to our pharmacy [facility]). Patient has an appt 11/7 and needs meds before then. Please follow up.  Thank you.

## 2015-10-31 MED ORDER — INSULIN NPH ISOPHANE & REGULAR (70-30) 100 UNIT/ML ~~LOC~~ SUSP: 40.0000 [IU] | Freq: Two times a day (BID) | SUBCUTANEOUS | 0 refills | Status: DC

## 2015-10-31 MED FILL — NOVOLIN 70/30 100 UNITS/ML: (70-30) 100 | 28 days supply | Qty: 20 | Fill #0

## 2015-10-31 NOTE — Telephone Encounter (Signed)
Insulin refilled and sent to [facility] pharmacy - a month supply to last until office visit.

## 2015-11-13 ENCOUNTER — Encounter: Payer: Self-pay | Admitting: Family Medicine

## 2015-11-20 ENCOUNTER — Other Ambulatory Visit: Payer: Self-pay

## 2015-11-20 DIAGNOSIS — E1136 Type 2 diabetes mellitus with diabetic cataract: Secondary | ICD-10-CM

## 2015-11-20 DIAGNOSIS — IMO0002 Reserved for concepts with insufficient information to code with codable children: Secondary | ICD-10-CM

## 2015-11-20 DIAGNOSIS — E1165 Type 2 diabetes mellitus with hyperglycemia: Principal | ICD-10-CM

## 2015-11-20 DIAGNOSIS — Z794 Long term (current) use of insulin: Principal | ICD-10-CM

## 2015-11-20 MED ORDER — INSULIN NPH ISOPHANE & REGULAR (70-30) 100 UNIT/ML ~~LOC~~ SUSP: 40.0000 [IU] | Freq: Two times a day (BID) | SUBCUTANEOUS | 0 refills | Status: DC

## 2015-11-20 MED ORDER — LIRAGLUTIDE 18 MG/3ML ~~LOC~~ SOPN: 1.8000 mg | PEN_INJECTOR | Freq: Every day | SUBCUTANEOUS | 3 refills | Status: DC

## 2015-11-20 MED FILL — NOVOLIN 70/30 100 UNITS/ML: (70-30) 100 | 25 days supply | Qty: 20 | Fill #0

## 2015-11-20 MED FILL — !VICTOZA 18MG/3ML INJECT: 18 | 30 days supply | Qty: 9 | Fill #0

## 2015-11-21 ENCOUNTER — Other Ambulatory Visit: Payer: Self-pay | Admitting: Family Medicine

## 2015-11-21 DIAGNOSIS — E1136 Type 2 diabetes mellitus with diabetic cataract: Secondary | ICD-10-CM

## 2015-11-21 DIAGNOSIS — Z794 Long term (current) use of insulin: Principal | ICD-10-CM

## 2015-11-21 DIAGNOSIS — IMO0002 Reserved for concepts with insufficient information to code with codable children: Secondary | ICD-10-CM

## 2015-11-21 DIAGNOSIS — E1165 Type 2 diabetes mellitus with hyperglycemia: Principal | ICD-10-CM

## 2015-11-23 ENCOUNTER — Other Ambulatory Visit: Payer: Self-pay | Admitting: Family Medicine

## 2015-11-23 DIAGNOSIS — IMO0002 Reserved for concepts with insufficient information to code with codable children: Secondary | ICD-10-CM

## 2015-11-23 DIAGNOSIS — E1136 Type 2 diabetes mellitus with diabetic cataract: Secondary | ICD-10-CM

## 2015-11-23 DIAGNOSIS — E1165 Type 2 diabetes mellitus with hyperglycemia: Principal | ICD-10-CM

## 2015-11-23 DIAGNOSIS — Z794 Long term (current) use of insulin: Principal | ICD-10-CM

## 2015-11-23 MED ORDER — SITAGLIPTIN PHOSPHATE 100 MG PO TABS: 100.0000 mg | ORAL_TABLET | Freq: Every day | ORAL | 11 refills | Status: DC

## 2015-11-23 MED FILL — $JANUVIA 100 MG TABLET: 100 | 30 days supply | Qty: 30 | Fill #0

## 2015-12-10 ENCOUNTER — Ambulatory Visit: Payer: Self-pay | Attending: Family Medicine | Admitting: Family Medicine

## 2015-12-10 ENCOUNTER — Encounter: Payer: Self-pay | Admitting: Family Medicine

## 2015-12-10 VITALS — BP 170/88 | HR 100 | Temp 98.1°F | Ht 60.0 in | Wt 173.0 lb

## 2015-12-10 DIAGNOSIS — J302 Other seasonal allergic rhinitis: Secondary | ICD-10-CM | POA: Insufficient documentation

## 2015-12-10 DIAGNOSIS — E1136 Type 2 diabetes mellitus with diabetic cataract: Secondary | ICD-10-CM | POA: Insufficient documentation

## 2015-12-10 DIAGNOSIS — F332 Major depressive disorder, recurrent severe without psychotic features: Secondary | ICD-10-CM | POA: Insufficient documentation

## 2015-12-10 DIAGNOSIS — I10 Essential (primary) hypertension: Secondary | ICD-10-CM | POA: Insufficient documentation

## 2015-12-10 DIAGNOSIS — E1165 Type 2 diabetes mellitus with hyperglycemia: Secondary | ICD-10-CM | POA: Insufficient documentation

## 2015-12-10 DIAGNOSIS — IMO0002 Reserved for concepts with insufficient information to code with codable children: Secondary | ICD-10-CM

## 2015-12-10 DIAGNOSIS — M25541 Pain in joints of right hand: Secondary | ICD-10-CM | POA: Insufficient documentation

## 2015-12-10 DIAGNOSIS — E1169 Type 2 diabetes mellitus with other specified complication: Secondary | ICD-10-CM | POA: Insufficient documentation

## 2015-12-10 DIAGNOSIS — Z794 Long term (current) use of insulin: Secondary | ICD-10-CM | POA: Insufficient documentation

## 2015-12-10 DIAGNOSIS — M25542 Pain in joints of left hand: Secondary | ICD-10-CM | POA: Insufficient documentation

## 2015-12-10 DIAGNOSIS — I152 Hypertension secondary to endocrine disorders: Secondary | ICD-10-CM

## 2015-12-10 DIAGNOSIS — E1159 Type 2 diabetes mellitus with other circulatory complications: Secondary | ICD-10-CM

## 2015-12-10 DIAGNOSIS — Z23 Encounter for immunization: Secondary | ICD-10-CM | POA: Insufficient documentation

## 2015-12-10 DIAGNOSIS — Z1159 Encounter for screening for other viral diseases: Secondary | ICD-10-CM

## 2015-12-10 DIAGNOSIS — E11311 Type 2 diabetes mellitus with unspecified diabetic retinopathy with macular edema: Secondary | ICD-10-CM

## 2015-12-10 DIAGNOSIS — E785 Hyperlipidemia, unspecified: Secondary | ICD-10-CM | POA: Insufficient documentation

## 2015-12-10 LAB — LIPID PANEL
Cholesterol: 183 mg/dL (ref <200)
HDL: 57 mg/dL (ref >50)
LDL CALC: 114 mg/dL — AB (ref <100)
Total CHOL/HDL Ratio: 3.2 Ratio (ref <5.0)
Triglycerides: 58 mg/dL (ref <150)
VLDL: 12 mg/dL (ref <30)

## 2015-12-10 LAB — COMPLETE METABOLIC PANEL WITH GFR
ALT: 96 U/L — AB (ref 6–29)
AST: 56 U/L — AB (ref 10–35)
Albumin: 3.7 g/dL (ref 3.6–5.1)
Alkaline Phosphatase: 198 U/L — ABNORMAL HIGH (ref 33–130)
BUN: 39 mg/dL — AB (ref 7–25)
CHLORIDE: 107 mmol/L (ref 98–110)
CO2: 27 mmol/L (ref 20–31)
CREATININE: 1.69 mg/dL — AB (ref 0.50–1.05)
Calcium: 9.2 mg/dL (ref 8.6–10.4)
GFR, Est African American: 39 mL/min — ABNORMAL LOW (ref >=60)
GFR, Est Non African American: 34 mL/min — ABNORMAL LOW (ref >=60)
GLUCOSE: 229 mg/dL — AB (ref 65–99)
Potassium: 4.5 mmol/L (ref 3.5–5.3)
Sodium: 141 mmol/L (ref 135–146)
TOTAL PROTEIN: 6 g/dL — AB (ref 6.1–8.1)
Total Bilirubin: 0.3 mg/dL (ref 0.2–1.2)

## 2015-12-10 LAB — GLUCOSE, POCT (MANUAL RESULT ENTRY): POC Glucose: 224 mg/dl — AB (ref 70–99)

## 2015-12-10 LAB — POCT GLYCOSYLATED HEMOGLOBIN (HGB A1C): Hemoglobin A1C: 12.6

## 2015-12-10 MED ORDER — ZOLPIDEM TARTRATE 10 MG PO TABS: 10.0000 mg | ORAL_TABLET | Freq: Every evening | ORAL | 2 refills | Status: DC | PRN

## 2015-12-10 MED ORDER — INSULIN NPH ISOPHANE & REGULAR (70-30) 100 UNIT/ML ~~LOC~~ SUSP: 50.0000 [IU] | Freq: Two times a day (BID) | SUBCUTANEOUS | 5 refills | Status: DC

## 2015-12-10 MED ORDER — CETIRIZINE HCL 10 MG PO TABS: 10.0000 mg | ORAL_TABLET | Freq: Every day | ORAL | 11 refills | Status: DC

## 2015-12-10 MED ORDER — CARVEDILOL 6.25 MG PO TABS: 6.2500 mg | ORAL_TABLET | Freq: Two times a day (BID) | ORAL | 2 refills | Status: DC

## 2015-12-10 MED ORDER — ATORVASTATIN CALCIUM 40 MG PO TABS: 40.0000 mg | ORAL_TABLET | Freq: Every day | ORAL | 11 refills | Status: DC

## 2015-12-10 MED ORDER — AMLODIPINE BESYLATE 10 MG PO TABS: 10.0000 mg | ORAL_TABLET | Freq: Every day | ORAL | 11 refills | Status: DC

## 2015-12-10 MED ORDER — CYCLOBENZAPRINE HCL 10 MG PO TABS: 10.0000 mg | ORAL_TABLET | Freq: Three times a day (TID) | ORAL | 0 refills | Status: DC | PRN

## 2015-12-10 MED ORDER — LIRAGLUTIDE 18 MG/3ML ~~LOC~~ SOPN: 1.8000 mg | PEN_INJECTOR | Freq: Every day | SUBCUTANEOUS | 3 refills | Status: DC

## 2015-12-10 MED ORDER — LISINOPRIL-HYDROCHLOROTHIAZIDE 20-12.5 MG PO TABS: 2.0000 | ORAL_TABLET | Freq: Every day | ORAL | 11 refills | Status: DC

## 2015-12-10 MED FILL — AMLODIPINE BESYLATE 10 MG T: 10 | 30 days supply | Qty: 30 | Fill #0

## 2015-12-10 MED FILL — ATORVASTATIN 40 MG TABLET: 40 | 30 days supply | Qty: 30 | Fill #0

## 2015-12-10 MED FILL — LISINOPRIL-HCTZ 20-12.5 MG: 20-12.5 | 30 days supply | Qty: 60 | Fill #0

## 2015-12-10 MED FILL — CYCLOBENZAPRINE 10 MG TAB: 10 | 10 days supply | Qty: 30 | Fill #0

## 2015-12-10 MED FILL — CARVEDILOL 6.25 MG TABLET: 6.25 | 30 days supply | Qty: 60 | Fill #0

## 2015-12-10 MED FILL — ?CETIRIZINE HCL 10 MG TABLE: 10 | 30 days supply | Qty: 30 | Fill #0

## 2015-12-10 MED FILL — $VICTOZA 2-PAK 18MG/3ML PEN: 18 | 30 days supply | Qty: 9 | Fill #0

## 2015-12-10 MED FILL — $novoLIN 70/30 100 UNITS/ML: (70-30) 100 | 20 days supply | Qty: 20 | Fill #0

## 2015-12-10 NOTE — Patient Instructions (Addendum)
Ann Bryan was seen today for diabetes.  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 -     lisinopril-hydrochlorothiazide (ZESTORETIC) 20-12.5 MG tablet; Take 2 tablets by mouth daily. -     carvedilol (COREG) 6.25 MG tablet; Take 1 tablet (6.25 mg total) by mouth 2 (two) times daily with a meal.  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 -     insulin NPH-regular Human (NOVOLIN 70/30) (70-30) 100 UNIT/ML injection; Inject 50 Units into the skin 2 (two) times daily with a meal.  Chronic seasonal allergic rhinitis, unspecified trigger -     cetirizine (ZYRTEC) 10 MG tablet; Take 1 tablet (10 mg total) by mouth daily.  Arthralgia of both hands -     ANA,IFA RA Diag Pnl w/rflx Tit/Patn -     Uric Acid  Severe episode of recurrent major depressive disorder, without psychotic features (HCC) -     zolpidem (AMBIEN) 10 MG tablet; Take 1 tablet (10 mg total) by mouth at bedtime as needed for sleep.   Counseling services available at [place], [place] and [place].   Diabetes blood sugar goals  Fasting (in AM before breakfast, 8 hrs of no eating or drinking (except water or unsweetened coffee or tea): 90-110 2 hrs  after meals: < 160,   No low sugars: nothing < 70   F/u in 4 weeks for diabetes and HTN   Dr. Adrian Blackwater

## 2015-12-10 NOTE — Assessment & Plan Note (Signed)
HTN  Med: partially compliant Add coreg  Close f/u for coreg taper

## 2015-12-10 NOTE — Assessment & Plan Note (Signed)
Major depression Patient declines pharmacotherapy Referred to counseling services

## 2015-12-10 NOTE — Assessment & Plan Note (Signed)
Uncontrolled Plan: Increase novolin to 50 U BID Advised increased exercise Continue [place] and

## 2015-12-10 NOTE — Progress Notes (Signed)
Subjective:  Patient ID: Ann Bryan, female    DOB: [birth year]  Age: 55 y.o. MRN: [id number]  CC: Diabetes   HPI MIYEKO MAHLUM presents for    1. CHRONIC DIABETES  Disease Monitoring  Blood Sugar Ranges: 200s   Polyuria: no   Visual problems: no   Medication Compliance: yes  Medication Side Effects  Hypoglycemia: yes, subjective low, she did not check it    [place] Exam: due   Foot Exam: done today   Diet pattern: eating low carb   Exercise: no   2. HTN: taking norvasc and prinzide. No taking metoprolol. No HA, CP or SOB. No leg swelling.  3. Joint pain: in knees and hands. No swelling or rash. Takes BC powder for pain. Gets cramping in hands. 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 Pain is worse in L hand. She is R handed.   Social History  Substance Use Topics  . Smoking status: Never Smoker  . Smokeless tobacco: Never Used  . Alcohol use No    Outpatient Medications Prior to Visit  Medication Sig Dispense Refill  . amLODipine (NORVASC) 10 MG tablet Take 1 tablet (10 mg total) by mouth daily. 30 tablet 0  . atorvastatin (LIPITOR) 40 MG tablet Take 1 tablet (40 mg total) by mouth daily. 90 tablet 3  . Blood Glucose Monitoring Suppl (TRUE METRIX METER) W/DEVICE KIT 1 each by Does not apply route as needed. 1 kit 0  . cetirizine (ZYRTEC) 10 MG tablet Take 1 tablet (10 mg total) by mouth daily. 30 tablet 2  . glucose blood (TRUE METRIX BLOOD GLUCOSE TEST) test strip 1 each by Other route 3 (three) times daily. 100 each 11  . insulin NPH-regular Human (NOVOLIN 70/30) (70-30) 100 UNIT/ML injection Inject 40 Units into the skin 2 (two) times daily with a meal. 20 mL 0  . Insulin Syringes, Disposable, U-100 0.5 ML MISC 1 each by Does not apply route 2 (two) times daily. 100 each 8  . liraglutide 18 MG/3ML SOPN Inject 0.3 mLs (1.8 mg total) into the skin daily. 18 mL 3  . lisinopril-hydrochlorothiazide (ZESTORETIC) 20-12.5 MG tablet Take 2  tablets by mouth daily. 180 tablet 1  . sitaGLIPtin (JANUVIA) 100 MG tablet Take 1 tablet (100 mg total) by mouth daily. 30 tablet 11  . TRUEPLUS LANCETS 28G MISC 1 each by Does not apply route 3 (three) times daily. 100 each 12  . zolpidem (AMBIEN) 10 MG tablet Take 1 tablet (10 mg total) by mouth at bedtime as needed for sleep. 30 tablet 2  . ibuprofen (ADVIL,MOTRIN) 800 MG tablet Take 1 tablet (800 mg total) by mouth 3 (three) times daily. (Patient not taking: Reported on 12/10/2015) 21 tablet 0  . metoprolol succinate (TOPROL-XL) 50 MG 24 hr tablet Take 2 tablets (100 mg total) by mouth daily. Needs office visit for more refills (Patient not taking: Reported on 12/10/2015) 60 tablet 0  . timolol (BETIMOL) 0.5 % ophthalmic solution Place 1 drop into both eyes daily. (Patient not taking: Reported on 12/10/2015) 10 mL 3   No facility-administered medications prior to visit.     ROS Review of Systems  Constitutional: Negative for chills and fever.  Eyes: Negative for visual disturbance.  Respiratory: Negative for shortness of breath.   Cardiovascular: Negative for chest pain.  Gastrointestinal: Negative for abdominal pain and blood in stool.  Musculoskeletal: Positive for arthralgias (knees and hands ) and gait problem (  R foot plantar fascitis ). Negative for back pain.  Skin: Negative for rash.  Allergic/Immunologic: Negative for immunocompromised state.  Hematological: Negative for adenopathy. Does not bruise/bleed easily.  Psychiatric/Behavioral: Positive for dysphoric mood. Negative for suicidal ideas. The patient is nervous/anxious.     Objective:  BP (!) 170/88 (BP Location: Right Arm, Patient Position: Sitting, Cuff Size: Small)   Pulse 100   Temp 98.1 F (36.7 C) (Oral)   Ht 5' (1.524 m)   Wt 173 lb (78.5 kg)   LMP 03/13/2002   SpO2 97%   BMI 33.79 kg/m   BP/Weight 12/10/2015 10/20/2014 3/47/4259  Systolic BP 563 875 643  Diastolic BP 88 84 62  Wt. (Lbs) 173 179 170  BMI  33.79 34.96 33.2    Physical Exam  Constitutional: She is oriented to person, place, and time. She appears well-developed and well-nourished. No distress.  HENT:  Head: Normocephalic and atraumatic.  Cardiovascular: Normal rate, regular rhythm, normal heart sounds and intact distal pulses.   Pulmonary/Chest: Effort normal and breath sounds normal.  Musculoskeletal: She exhibits no edema.  Neurological: She is alert and oriented to person, place, and time.  Skin: Skin is warm and dry. No rash noted.  Psychiatric: She has a normal mood and affect.    Lab Results  Component Value Date   HGBA1C 12.6 12/10/2015   CBG 224  Depression screen [facility] 2/9 12/10/2015 10/20/2014 11/23/2012  Decreased Interest _0 Down, Depressed, Hopeless _1 PHQ - 2 Score _2 Altered sleeping 3 3 0  Tired, decreased energy _3 Change in appetite 3 3 0  Feeling bad or failure about yourself  _4 Trouble concentrating _5 Moving slowly or fidgety/restless 0 3 0  Suicidal thoughts - 3 3  PHQ-9 Score _6 GAD 7 : Generalized Anxiety Score 12/10/2015  Nervous, Anxious, on Edge 3  Control/stop worrying 3  Worry too much - different things 3  Trouble relaxing 3  Restless 3  Easily annoyed or irritable 3  Afraid - awful might happen 3  Total GAD 7 Score 21     Assessment & Plan:  Lauralyn was seen today for diabetes.  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 -     insulin NPH-regular Human (NOVOLIN 70/30) (70-30) 100 UNIT/ML injection; Inject 50 Units into the skin 2 (two) times daily with a meal. -     liraglutide 18 MG/3ML SOPN; Inject 0.3 mLs (1.8 mg total) into the  skin daily.  Hyperlipidemia associated with type 2 diabetes mellitus (HCC) -     Lipid Panel -     COMPLETE METABOLIC PANEL WITH GFR  Need for hepatitis C screening test -     Hepatitis C antibody, reflex  Hypertension associated with diabetes (HCC) -     amLODipine (NORVASC) 10 MG tablet; Take 1 tablet (10 mg total) by mouth daily. -     lisinopril-hydrochlorothiazide (ZESTORETIC) 20-12.5 MG tablet; Take 2 tablets by mouth daily. -     carvedilol (COREG) 6.25 MG tablet;  Take 1 tablet (6.25 mg total) by mouth 2 (two) times daily with a meal.  Chronic seasonal allergic rhinitis, unspecified trigger -     cetirizine (ZYRTEC) 10 MG tablet; Take 1 tablet (10 mg total) by mouth daily.  Arthralgia of both hands -     ANA,IFA RA Diag Pnl w/rflx Tit/Patn -     Uric Acid -     cyclobenzaprine (FLEXERIL) 10 MG tablet; Take 1 tablet (10 mg total) by mouth 3 (three) times daily as needed for muscle spasms.  Severe episode of recurrent major depressive disorder, without psychotic features ([place]) -     zolpidem (AMBIEN) 10 MG tablet; Take 1 tablet (10 mg total) by mouth at bedtime as needed for sleep.  Encounter for immunization -     POCT glucose (manual entry) -     POCT glycosylated hemoglobin (Hb A1C) -     Lipid Panel -     COMPLETE METABOLIC PANEL WITH GFR -     Hepatitis C antibody, reflex -     amLODipine (NORVASC) 10 MG tablet; Take 1 tablet (10 mg total) by mouth daily. -     lisinopril-hydrochlorothiazide (ZESTORETIC) 20-12.5 MG tablet; Take 2 tablets by mouth daily. -     carvedilol (COREG) 6.25 MG tablet; Take 1 tablet (6.25 mg total) by mouth 2 (two) times daily with a meal. -     Ambulatory referral to Ophthalmology -     atorvastatin (LIPITOR) 40 MG tablet; Take 1 tablet (40 mg total) by mouth daily. -     insulin NPH-regular Human (NOVOLIN 70/30) (70-30) 100 UNIT/ML injection; Inject 50 Units into the skin 2 (two) times daily with a meal. -     cetirizine (ZYRTEC) 10 MG tablet;  Take 1 tablet (10 mg total) by mouth daily. -     zolpidem (AMBIEN) 10 MG tablet; Take 1 tablet (10 mg total) by mouth at bedtime as needed for sleep. -     ANA,IFA RA Diag Pnl w/rflx Tit/Patn -     Uric Acid -     cyclobenzaprine (FLEXERIL) 10 MG tablet; Take 1 tablet (10 mg total) by mouth 3 (three) times daily as needed for muscle spasms. -     Flu Vaccine QUAD 36+ mos IM -     liraglutide 18 MG/3ML SOPN; Inject 0.3 mLs (1.8 mg total) into the skin daily.  Flu vaccine need -     Flu Vaccine QUAD 36+ mos IM   No orders of the defined types were placed in this encounter.   Follow-up: Return in about 4 weeks (around 01/07/2016) for HTN and DM2.   Boykin Nearing MD

## 2015-12-10 NOTE — Progress Notes (Signed)
Pt is here today to follow up on Diabetes. Pt is getting flu shot today.

## 2015-12-10 NOTE — Assessment & Plan Note (Signed)
Arthralgias in hands and knees No definite arthritis Plan: NSAID Flexeril Checking ANA and uric acid

## 2015-12-11 LAB — URIC ACID: URIC ACID, SERUM: 6.9 mg/dL (ref 2.5–7.0)

## 2015-12-11 LAB — HEPATITIS C ANTIBODY: HCV Ab: NEGATIVE

## 2015-12-11 LAB — ANA,IFA RA DIAG PNL W/RFLX TIT/PATN: ANA: NEGATIVE

## 2016-01-08 MED FILL — LISINOPRIL-HCTZ 20-12.5 MG: 20-12.5 | 30 days supply | Qty: 60 | Fill #1

## 2016-01-08 MED FILL — $novoLIN 70/30 100 UNITS/ML: (70-30) 100 | 20 days supply | Qty: 20 | Fill #1

## 2016-01-08 MED FILL — ?CETIRIZINE HCL 10 MG TABLE: 10 | 30 days supply | Qty: 30 | Fill #1

## 2016-01-08 MED FILL — **JANUVIA 100 MG TABLET: 100 | 17 days supply | Qty: 17 | Fill #1

## 2016-01-08 MED FILL — $VICTOZA 2-PAK 18MG/3ML PEN: 18 | 30 days supply | Qty: 9 | Fill #1

## 2016-01-08 MED FILL — ATORVASTATIN 40 MG TABLET: 40 | 30 days supply | Qty: 30 | Fill #1

## 2016-01-08 MED FILL — AMLODIPINE BESYLATE 10 MG T: 10 | 30 days supply | Qty: 30 | Fill #1

## 2016-01-08 MED FILL — CARVEDILOL 6.25 MG TABLET: 6.25 | 30 days supply | Qty: 60 | Fill #1

## 2016-02-27 MED FILL — ?CETIRIZINE HCL 10 MG TABLE: 10 | 30 days supply | Qty: 30 | Fill #2

## 2016-02-27 MED FILL — AMLODIPINE BESYLATE 10 MG T: 10 | 30 days supply | Qty: 30 | Fill #2

## 2016-02-27 MED FILL — !VICTOZA 18MG/3ML INJECT: 18 | 19 days supply | Qty: 6 | Fill #2

## 2016-02-27 MED FILL — ATORVASTATIN 40 MG TABLET: 40 | 30 days supply | Qty: 30 | Fill #2

## 2016-02-27 MED FILL — ?CARVEDILOL 6.25 MG TABLET: 6.25 | 30 days supply | Qty: 60 | Fill #2

## 2016-02-27 MED FILL — NOVOLIN 70/30 100 UNITS/ML: (70-30) 100 | 10 days supply | Qty: 10 | Fill #2

## 2016-02-29 ENCOUNTER — Ambulatory Visit: Payer: Self-pay | Attending: Family Medicine

## 2016-02-29 ENCOUNTER — Other Ambulatory Visit: Payer: Self-pay | Admitting: Pharmacist

## 2016-02-29 MED ORDER — SAXAGLIPTIN HCL 5 MG PO TABS: 5.0000 mg | ORAL_TABLET | Freq: Every day | ORAL | 2 refills | Status: DC

## 2016-03-01 ENCOUNTER — Other Ambulatory Visit: Payer: Self-pay | Admitting: Family Medicine

## 2016-03-01 DIAGNOSIS — Z1231 Encounter for screening mammogram for malignant neoplasm of breast: Secondary | ICD-10-CM

## 2016-03-20 ENCOUNTER — Ambulatory Visit: Payer: Self-pay | Attending: Family Medicine

## 2016-03-20 MED FILL — **JANUVIA 100 MG TABLET: 100 | 30 days supply | Qty: 30 | Fill #2

## 2016-03-20 MED FILL — !NOVOLIN 70/30 100 UNITS/ML: (70-30) 100 | 10 days supply | Qty: 10 | Fill #3

## 2016-04-07 ENCOUNTER — Other Ambulatory Visit: Payer: Self-pay

## 2016-04-07 ENCOUNTER — Other Ambulatory Visit: Payer: Self-pay | Admitting: Family Medicine

## 2016-04-07 DIAGNOSIS — E1165 Type 2 diabetes mellitus with hyperglycemia: Principal | ICD-10-CM

## 2016-04-07 DIAGNOSIS — IMO0002 Reserved for concepts with insufficient information to code with codable children: Secondary | ICD-10-CM

## 2016-04-07 DIAGNOSIS — E11311 Type 2 diabetes mellitus with unspecified diabetic retinopathy with macular edema: Secondary | ICD-10-CM

## 2016-04-07 DIAGNOSIS — E1136 Type 2 diabetes mellitus with diabetic cataract: Secondary | ICD-10-CM

## 2016-04-07 DIAGNOSIS — Z794 Long term (current) use of insulin: Principal | ICD-10-CM

## 2016-04-07 MED ORDER — LIRAGLUTIDE 18 MG/3ML ~~LOC~~ SOPN: 1.8000 mg | PEN_INJECTOR | Freq: Every day | SUBCUTANEOUS | 3 refills | Status: DC

## 2016-04-07 MED ORDER — SITAGLIPTIN PHOSPHATE 100 MG PO TABS: 100.0000 mg | ORAL_TABLET | Freq: Every day | ORAL | 3 refills | Status: DC

## 2016-04-07 MED FILL — !VICTOZA 18MG/3ML INJECT: 18 | 10 days supply | Qty: 1 | Fill #3

## 2016-04-07 MED FILL — ATORVASTATIN 40 MG TABLET: 40 | 30 days supply | Qty: 30 | Fill #3

## 2016-04-07 MED FILL — NOVOLIN 70/30 100 UNITS/ML: (70-30) 100 | 10 days supply | Qty: 10 | Fill #4

## 2016-04-07 MED FILL — AMLODIPINE BESYLATE 10 MG T: 10 | 30 days supply | Qty: 30 | Fill #3

## 2016-04-15 ENCOUNTER — Other Ambulatory Visit: Payer: Self-pay

## 2016-04-15 DIAGNOSIS — E11311 Type 2 diabetes mellitus with unspecified diabetic retinopathy with macular edema: Secondary | ICD-10-CM

## 2016-04-15 DIAGNOSIS — E1165 Type 2 diabetes mellitus with hyperglycemia: Principal | ICD-10-CM

## 2016-04-16 ENCOUNTER — Other Ambulatory Visit: Payer: Self-pay

## 2016-04-16 DIAGNOSIS — E1136 Type 2 diabetes mellitus with diabetic cataract: Secondary | ICD-10-CM

## 2016-04-16 DIAGNOSIS — E1165 Type 2 diabetes mellitus with hyperglycemia: Principal | ICD-10-CM

## 2016-04-16 DIAGNOSIS — IMO0002 Reserved for concepts with insufficient information to code with codable children: Secondary | ICD-10-CM

## 2016-04-16 DIAGNOSIS — E118 Type 2 diabetes mellitus with unspecified complications: Secondary | ICD-10-CM

## 2016-04-16 DIAGNOSIS — Z794 Long term (current) use of insulin: Principal | ICD-10-CM

## 2016-04-16 MED ORDER — INSULIN NPH ISOPHANE & REGULAR (70-30) 100 UNIT/ML ~~LOC~~ SUSP: 50.0000 [IU] | Freq: Two times a day (BID) | SUBCUTANEOUS | 3 refills | Status: DC

## 2016-04-16 MED ORDER — SITAGLIPTIN PHOSPHATE 100 MG PO TABS: 100.0000 mg | ORAL_TABLET | Freq: Every day | ORAL | 3 refills | Status: DC

## 2016-04-17 ENCOUNTER — Other Ambulatory Visit: Payer: Self-pay | Admitting: Family Medicine

## 2016-04-17 DIAGNOSIS — E1165 Type 2 diabetes mellitus with hyperglycemia: Principal | ICD-10-CM

## 2016-04-17 DIAGNOSIS — E11311 Type 2 diabetes mellitus with unspecified diabetic retinopathy with macular edema: Secondary | ICD-10-CM

## 2016-04-17 MED ORDER — LIRAGLUTIDE 18 MG/3ML ~~LOC~~ SOPN: 1.8000 mg | PEN_INJECTOR | Freq: Every day | SUBCUTANEOUS | 3 refills | Status: DC

## 2016-04-21 ENCOUNTER — Other Ambulatory Visit: Payer: Self-pay | Admitting: Family Medicine

## 2016-04-21 DIAGNOSIS — I10 Essential (primary) hypertension: Principal | ICD-10-CM

## 2016-04-21 DIAGNOSIS — M25541 Pain in joints of right hand: Secondary | ICD-10-CM

## 2016-04-21 DIAGNOSIS — E1159 Type 2 diabetes mellitus with other circulatory complications: Secondary | ICD-10-CM

## 2016-04-21 DIAGNOSIS — M25542 Pain in joints of left hand: Secondary | ICD-10-CM

## 2016-04-21 MED FILL — **JANUVIA 100 MG TABLET: 100 | 30 days supply | Qty: 30 | Fill #3

## 2016-04-21 MED FILL — !VICTOZA 18MG/3ML INJECT: 18 | 10 days supply | Qty: 3 | Fill #4

## 2016-04-21 MED FILL — !NOVOLIN 70/30 100 UNITS/ML: (70-30) 100 | 10 days supply | Qty: 10 | Fill #5

## 2016-04-22 MED FILL — CARVEDILOL 6.25 MG TABLET: 6.25 | 30 days supply | Qty: 60 | Fill #0

## 2016-04-22 MED FILL — ?CYCLOBENZAPRINE 10 MG TABL: 10 | 10 days supply | Qty: 30 | Fill #0

## 2016-04-29 ENCOUNTER — Ambulatory Visit
Admission: RE | Admit: 2016-04-29 | Discharge: 2016-04-29 | Disposition: A | Discharge status: Home / Self Care | Payer: No Typology Code available for payment source | Source: Ambulatory Visit | Attending: Family Medicine | Admitting: Family Medicine

## 2016-04-29 DIAGNOSIS — Z1231 Encounter for screening mammogram for malignant neoplasm of breast: Secondary | ICD-10-CM

## 2016-05-13 MED FILL — NOVOLIN 70/30 100 UNITS/ML: (70-30) 100 | 10 days supply | Qty: 10 | Fill #6

## 2016-05-16 MED FILL — ATORVASTATIN 40 MG TABLET: 40 | 30 days supply | Qty: 30 | Fill #4

## 2016-05-16 MED FILL — VICTOZA 18 MG/3 ML INJECT P: 18 | 10 days supply | Qty: 3 | Fill #5

## 2016-05-16 MED FILL — AMLODIPINE BESYLATE 10 MG T: 10 | 30 days supply | Qty: 30 | Fill #4

## 2016-05-21 ENCOUNTER — Encounter: Payer: Self-pay | Admitting: Family Medicine

## 2016-05-28 ENCOUNTER — Ambulatory Visit: Payer: No Typology Code available for payment source

## 2016-05-30 ENCOUNTER — Ambulatory Visit: Payer: Self-pay | Attending: Family Medicine

## 2016-05-30 MED FILL — $VICTOZA 2-PAK 18MG/3ML PEN: 18 | 30 days supply | Qty: 9 | Fill #6

## 2016-05-30 MED FILL — $novoLIN 70/30 100 UNITS/ML: (70-30) 100 | 30 days supply | Qty: 30 | Fill #7

## 2016-06-23 ENCOUNTER — Other Ambulatory Visit: Payer: Self-pay | Admitting: Family Medicine

## 2016-06-23 DIAGNOSIS — E1159 Type 2 diabetes mellitus with other circulatory complications: Secondary | ICD-10-CM

## 2016-06-23 DIAGNOSIS — I10 Essential (primary) hypertension: Principal | ICD-10-CM

## 2016-06-23 MED FILL — ATORVASTATIN 40 MG TABLET: 40 | 30 days supply | Qty: 30 | Fill #5

## 2016-06-25 MED FILL — CARVEDILOL 6.25 MG TABLET: 6.25 | 30 days supply | Qty: 60 | Fill #0

## 2016-06-26 ENCOUNTER — Other Ambulatory Visit: Payer: Self-pay | Admitting: Family Medicine

## 2016-06-26 DIAGNOSIS — M25541 Pain in joints of right hand: Secondary | ICD-10-CM

## 2016-06-26 DIAGNOSIS — M25542 Pain in joints of left hand: Principal | ICD-10-CM

## 2016-06-26 MED FILL — LISINOPRIL-HCTZ 20-12.5 MG: 20-12.5 | 30 days supply | Qty: 60 | Fill #2

## 2016-06-26 MED FILL — AMLODIPINE BESYLATE 10 MG T: 10 | 30 days supply | Qty: 30 | Fill #5

## 2016-06-26 MED FILL — ?CYCLOBENZAPRINE 10 MG TABL: 10 | 10 days supply | Qty: 30 | Fill #0

## 2016-07-02 ENCOUNTER — Other Ambulatory Visit: Payer: Self-pay | Admitting: Family Medicine

## 2016-07-02 DIAGNOSIS — E1136 Type 2 diabetes mellitus with diabetic cataract: Secondary | ICD-10-CM

## 2016-07-02 DIAGNOSIS — IMO0002 Reserved for concepts with insufficient information to code with codable children: Secondary | ICD-10-CM

## 2016-07-02 DIAGNOSIS — Z794 Long term (current) use of insulin: Principal | ICD-10-CM

## 2016-07-02 DIAGNOSIS — E1165 Type 2 diabetes mellitus with hyperglycemia: Principal | ICD-10-CM

## 2016-07-02 MED FILL — $novoLIN 70/30 100 UNITS/ML: (70-30) 100 | 20 days supply | Qty: 20 | Fill #0

## 2016-07-03 MED FILL — $VICTOZA 2-PAK 18MG/3ML PEN: 18 | 30 days supply | Qty: 9 | Fill #7

## 2016-08-12 ENCOUNTER — Other Ambulatory Visit: Payer: Self-pay | Admitting: Family Medicine

## 2016-08-12 DIAGNOSIS — M25542 Pain in joints of left hand: Principal | ICD-10-CM

## 2016-08-12 DIAGNOSIS — M25541 Pain in joints of right hand: Secondary | ICD-10-CM

## 2016-08-12 DIAGNOSIS — I152 Hypertension secondary to endocrine disorders: Secondary | ICD-10-CM

## 2016-08-12 DIAGNOSIS — I10 Essential (primary) hypertension: Secondary | ICD-10-CM

## 2016-08-12 DIAGNOSIS — E1159 Type 2 diabetes mellitus with other circulatory complications: Secondary | ICD-10-CM

## 2016-08-12 MED FILL — AMLODIPINE BESYLATE 10 MG T: 10 | 30 days supply | Qty: 30 | Fill #6

## 2016-08-12 MED FILL — LISINOPRIL-HCTZ 20-12.5 MG: 20-12.5 | 30 days supply | Qty: 60 | Fill #3

## 2016-08-12 MED FILL — $novoLIN 70/30 100 UNITS/ML: (70-30) 100 | 40 days supply | Qty: 40 | Fill #1

## 2016-08-12 MED FILL — $VICTOZA 2-PAK 18MG/3ML PEN: 18 | 30 days supply | Qty: 9 | Fill #8

## 2016-08-12 MED FILL — ATORVASTATIN 40 MG TABLET: 40 | 30 days supply | Qty: 30 | Fill #6

## 2016-08-15 ENCOUNTER — Other Ambulatory Visit: Payer: Self-pay | Admitting: Family Medicine

## 2016-08-15 DIAGNOSIS — I152 Hypertension secondary to endocrine disorders: Secondary | ICD-10-CM

## 2016-08-15 DIAGNOSIS — M25542 Pain in joints of left hand: Principal | ICD-10-CM

## 2016-08-15 DIAGNOSIS — I10 Essential (primary) hypertension: Secondary | ICD-10-CM

## 2016-08-15 DIAGNOSIS — M25541 Pain in joints of right hand: Secondary | ICD-10-CM

## 2016-08-15 DIAGNOSIS — E1159 Type 2 diabetes mellitus with other circulatory complications: Secondary | ICD-10-CM

## 2016-09-01 ENCOUNTER — Ambulatory Visit: Payer: Self-pay

## 2016-09-15 ENCOUNTER — Telehealth: Payer: Self-pay | Admitting: Family Medicine

## 2016-09-15 DIAGNOSIS — I10 Essential (primary) hypertension: Principal | ICD-10-CM

## 2016-09-15 DIAGNOSIS — I152 Hypertension secondary to endocrine disorders: Secondary | ICD-10-CM

## 2016-09-15 DIAGNOSIS — E1159 Type 2 diabetes mellitus with other circulatory complications: Secondary | ICD-10-CM

## 2016-09-15 MED ORDER — LIRAGLUTIDE 18 MG/3ML ~~LOC~~ SOPN: 1.8000 mg | PEN_INJECTOR | Freq: Every day | SUBCUTANEOUS | 0 refills | Status: DC

## 2016-09-15 MED ORDER — CARVEDILOL 6.25 MG PO TABS: ORAL_TABLET | ORAL | 0 refills | Status: DC

## 2016-09-15 MED FILL — ?CARVEDILOL 6.25 MG TABLET: 6.25 | 30 days supply | Qty: 60 | Fill #0

## 2016-09-15 NOTE — Telephone Encounter (Signed)
Refilled x 30 days but did not refill cyclobenzaprine - patient needs office visit for refill.

## 2016-09-15 NOTE — Telephone Encounter (Signed)
Pt. Came to facility requesting a refill on the following medications:   Victoza  Flexeril Coreg   Pt. Uses [facility] pharmacy and has scheduled an appt. To establish care on 10/09/16. Please f/u

## 2016-09-16 ENCOUNTER — Other Ambulatory Visit: Payer: Self-pay | Admitting: Pharmacist

## 2016-09-16 DIAGNOSIS — E1165 Type 2 diabetes mellitus with hyperglycemia: Principal | ICD-10-CM

## 2016-09-16 DIAGNOSIS — IMO0002 Reserved for concepts with insufficient information to code with codable children: Secondary | ICD-10-CM

## 2016-09-16 DIAGNOSIS — Z794 Long term (current) use of insulin: Principal | ICD-10-CM

## 2016-09-16 DIAGNOSIS — E1136 Type 2 diabetes mellitus with diabetic cataract: Secondary | ICD-10-CM

## 2016-09-16 MED ORDER — INSULIN NPH ISOPHANE & REGULAR (70-30) 100 UNIT/ML ~~LOC~~ SUSP: 50.0000 [IU] | Freq: Two times a day (BID) | SUBCUTANEOUS | 0 refills | Status: DC

## 2016-09-16 MED FILL — $novoLIN 70/30 100 UNITS/ML: (70-30) 100 | 30 days supply | Qty: 30 | Fill #0

## 2016-09-19 ENCOUNTER — Ambulatory Visit: Payer: Self-pay

## 2016-09-23 MED FILL — $VICTOZA 2-PAK 18MG/3ML PEN: 18 | 30 days supply | Qty: 9 | Fill #9

## 2016-09-23 MED FILL — ?ATORVASTATIN 40MG TABLET: 40 | 30 days supply | Qty: 30 | Fill #7

## 2016-09-23 MED FILL — AMLODIPINE BESYLATE 10 MG T: 10 | 30 days supply | Qty: 30 | Fill #7

## 2016-09-23 MED FILL — LISINOPRIL-HCTZ 20-12.5 MG: 20-12.5 | 30 days supply | Qty: 60 | Fill #4

## 2016-09-29 ENCOUNTER — Ambulatory Visit: Payer: Self-pay

## 2016-10-08 ENCOUNTER — Ambulatory Visit: Payer: Self-pay | Attending: Internal Medicine

## 2016-10-09 ENCOUNTER — Ambulatory Visit: Payer: Self-pay | Attending: Internal Medicine | Admitting: Internal Medicine

## 2016-10-09 ENCOUNTER — Encounter: Payer: Self-pay | Admitting: Internal Medicine

## 2016-10-09 VITALS — BP 130/80 | HR 91 | Temp 98.0°F | Resp 16 | Wt 172.0 lb

## 2016-10-09 DIAGNOSIS — E1121 Type 2 diabetes mellitus with diabetic nephropathy: Secondary | ICD-10-CM

## 2016-10-09 DIAGNOSIS — E1139 Type 2 diabetes mellitus with other diabetic ophthalmic complication: Secondary | ICD-10-CM

## 2016-10-09 DIAGNOSIS — G4700 Insomnia, unspecified: Secondary | ICD-10-CM

## 2016-10-09 DIAGNOSIS — I10 Essential (primary) hypertension: Secondary | ICD-10-CM

## 2016-10-09 DIAGNOSIS — E785 Hyperlipidemia, unspecified: Secondary | ICD-10-CM

## 2016-10-09 DIAGNOSIS — F329 Major depressive disorder, single episode, unspecified: Secondary | ICD-10-CM

## 2016-10-09 DIAGNOSIS — Z794 Long term (current) use of insulin: Secondary | ICD-10-CM | POA: Insufficient documentation

## 2016-10-09 DIAGNOSIS — H409 Unspecified glaucoma: Secondary | ICD-10-CM

## 2016-10-09 DIAGNOSIS — E1165 Type 2 diabetes mellitus with hyperglycemia: Secondary | ICD-10-CM

## 2016-10-09 DIAGNOSIS — R252 Cramp and spasm: Secondary | ICD-10-CM

## 2016-10-09 DIAGNOSIS — F419 Anxiety disorder, unspecified: Secondary | ICD-10-CM

## 2016-10-09 DIAGNOSIS — IMO0002 Reserved for concepts with insufficient information to code with codable children: Secondary | ICD-10-CM

## 2016-10-09 LAB — POCT GLYCOSYLATED HEMOGLOBIN (HGB A1C): Hemoglobin A1C: 12.1

## 2016-10-09 LAB — GLUCOSE, POCT (MANUAL RESULT ENTRY): POC Glucose: 180 mg/dl — AB (ref 70–99)

## 2016-10-09 MED ORDER — AMLODIPINE BESYLATE 10 MG PO TABS: 10.0000 mg | ORAL_TABLET | Freq: Every day | ORAL | 3 refills | Status: DC

## 2016-10-09 MED ORDER — LISINOPRIL-HYDROCHLOROTHIAZIDE 20-12.5 MG PO TABS: 2.0000 | ORAL_TABLET | Freq: Every day | ORAL | 6 refills | Status: DC

## 2016-10-09 MED ORDER — CARVEDILOL 6.25 MG PO TABS: ORAL_TABLET | ORAL | 6 refills | Status: DC

## 2016-10-09 MED ORDER — CYCLOBENZAPRINE HCL 10 MG PO TABS: 10.0000 mg | ORAL_TABLET | Freq: Every day | ORAL | 1 refills | Status: DC | PRN

## 2016-10-09 MED ORDER — TRUE METRIX METER W/DEVICE KIT: 1.0000 | PACK | 0 refills | Status: DC | PRN

## 2016-10-09 MED ORDER — INSULIN NPH ISOPHANE & REGULAR (70-30) 100 UNIT/ML ~~LOC~~ SUSP: 55.0000 [IU] | Freq: Two times a day (BID) | SUBCUTANEOUS | 11 refills | Status: DC

## 2016-10-09 MED ORDER — LIRAGLUTIDE 18 MG/3ML ~~LOC~~ SOPN: 1.8000 mg | PEN_INJECTOR | Freq: Every day | SUBCUTANEOUS | 11 refills | Status: DC

## 2016-10-09 MED ORDER — ATORVASTATIN CALCIUM 40 MG PO TABS: 40.0000 mg | ORAL_TABLET | Freq: Every day | ORAL | 3 refills | Status: DC

## 2016-10-09 MED ORDER — ZOLPIDEM TARTRATE 5 MG PO TABS: 5.0000 mg | ORAL_TABLET | Freq: Every evening | ORAL | 1 refills | Status: DC | PRN

## 2016-10-09 MED FILL — ?CARVEDILOL 6.25 MG TABLET: 6.25 | 30 days supply | Qty: 60 | Fill #0

## 2016-10-09 MED FILL — CYCLOBENZAPRINE 10 MG TAB: 10 | 30 days supply | Qty: 30 | Fill #0

## 2016-10-09 MED FILL — !TRUE METRIX BLOOD GLUCOSE: 365 days supply | Qty: 1 | Fill #0

## 2016-10-09 MED FILL — $novoLIN 70/30 100 UNITS/ML: (70-30) 100 | 18 days supply | Qty: 20 | Fill #0

## 2016-10-09 NOTE — Progress Notes (Signed)
Patient ID: Ann Bryan, female    DOB: 23-May-1960  MRN: [id number]  CC: re-establish; Diabetes; and Sinus Problem   Subjective: Ann Bryan is a 56 y.o. female who presents for chronic disease management. Last saw [facility] 12/2016 Her concerns today include:  Patient with history of DM, HTN, HL, seasonal allergies, depression and insomnia on Ambien  1. DM: -not checking. Meter not functioning -taking Januvia, Novolin insulin 50 units BID, Victozia; never got Onglyza -admits to over eating but not eating a lot of sugary things -use to walk every where but could not deal with the heat this yr. Also gets very bad cramps. Had a bad episode in hands about 1 mth ago but thinks BS was low.  -cramps in ankles and thighs. Sometimes cramps in feet at nights -plans to start walking in winter. Lives on 3rd floor apartment building and gets in some exercise going up and down the steps -over due for eye exam, last was 3 yrs. Has hx of glaucoma   2. Insomnia:   Worry and depress all the time. "I'm not not happy and never will be." Depression was worse yrs ago and was seeing someone for it.  No SI at this time. Does not want any medication or counseling at this time -Sleeping habits and not very consistent. Bedtime varies. Sometimes sleeps in her living room because it has an air conditioning unit which her bedroom does not have an mattress is not very comfortable. Sleeps with TV on at times when in the living room. Drinks tea and diet Pepsi several times a day into the evening.  -Has hard time falling and staying asleep -Takes Ambien as needed but not often. Last prescription written December/2017. Requesting a refill  HTN; compliant with amlodipine and lisinopril/HCTZ  Patient Active Problem List   Diagnosis Date Noted  . Chronic seasonal allergic rhinitis 12/10/2015  . Arthralgia of both hands 12/10/2015  . Severe episode of recurrent major depressive disorder, without psychotic features  ([place]) 12/10/2015  . Left radial head fracture 10/20/2014  . Abnormality of gait 04/13/2012  . NEPHROPATHY, DIABETIC 09/23/2006  . Hyperlipidemia associated with type 2 diabetes mellitus ([place]) 12/23/2005  . Early onset dysthymia 12/23/2005  . Hypertension associated with diabetes ([place]) 12/23/2005  . DM (diabetes mellitus) type II uncontrolled with eye manifestation ([place]) 01/07/1984     Current Outpatient Prescriptions on File Prior to Visit  Medication Sig Dispense Refill  . cetirizine (ZYRTEC) 10 MG tablet Take 1 tablet (10 mg total) by mouth daily. 30 tablet 11  . glucose blood (TRUE METRIX BLOOD GLUCOSE TEST) test strip 1 each by Other route 3 (three) times daily. 100 each 11  . insulin NPH-regular Human (NOVOLIN 70/30) (70-30) 100 UNIT/ML injection Inject 50 Units into the skin 2 (two) times daily with a meal. 30 mL 0  . Insulin Syringes, Disposable, U-100 0.5 ML MISC 1 each by Does not apply route 2 (two) times daily. 100 each 8  . sitaGLIPtin (JANUVIA) 100 MG tablet Take 1 tablet (100 mg total) by mouth daily. 90 tablet 3  . timolol (BETIMOL) 0.5 % ophthalmic solution Place 1 drop into both eyes daily. (Patient not taking: Reported on 12/10/2015) 10 mL 3  . TRUEPLUS LANCETS 28G MISC 1 each by Does not apply route 3 (three) times daily. 100 each 12   No current facility-administered medications on file prior to visit.     Allergies  Allergen Reactions  . Doxycycline  REACTION: N/V    Social History   Social History  . Marital status: Single    Spouse name: N/A  . Number of children: N/A  . Years of education: N/A   Occupational History  . Not on file.   Social History Main Topics  . Smoking status: Never Smoker  . Smokeless tobacco: Never Used  . Alcohol use No  . Drug use: No  . Sexual activity: Not Currently   Other Topics Concern  . Not on file   Social History Narrative  . No narrative on file    Family History  Problem Relation Age of Onset  .  Diabetes Mother   . Stroke Mother   . Hypertension Mother   . Alcohol abuse Mother   . Depression Father        Committed suicide  . Alcohol abuse Maternal Uncle     Past Surgical History:  Procedure Laterality Date  . ABDOMINAL HYSTERECTOMY   2004    ROS: Review of Systems Negative except for as stated above PHYSICAL EXAM: BP 130/80   Pulse 91   Temp 98 F (36.7 C) (Oral)   Resp 16   Wt 172 lb (78 kg)   LMP 03/13/2002   SpO2 100%   BMI 33.59 kg/m   BP 130/80 Physical Exam  General appearance - alert, well appearing, middle-age female and in no distress Mental status - alert, oriented to person, place, and time, normal mood, behavior, speech, dress, motor activity, and thought processes Eyes - pupils equal and reactive, extraocular eye movements intact Neck - supple, no significant adenopathy Chest - clear to auscultation, no wheezes, rales or rhonchi, symmetric air entry Heart - normal rate, regular rhythm, normal S1, S2, no murmurs, rubs, clicks or gallops Extremities - peripheral pulses normal, no pedal edema, no clubbing or cyanosis. 3+ pulses -Dps, posterior tibialis and femoral  Diabetic Foot Exam - Simple   Simple Foot Form Visual Inspection No deformities, no ulcerations, no other skin breakdown bilaterally:  Yes Sensation Testing Intact to touch and monofilament testing bilaterally:  Yes Pulse Check Posterior Tibialis and Dorsalis pulse intact bilaterally:  Yes Comments      Results for orders placed or performed in visit on 10/09/16  POCT glucose (manual entry)  Result Value Ref Range   POC Glucose 180 (A) 70 - 99 mg/dl  POCT glycosylated hemoglobin (Hb A1C)  Result Value Ref Range   Hemoglobin A1C 12.1    Depression screen [facility] 2/9 10/09/2016 12/10/2015 10/20/2014 11/23/2012 03/11/2012  Decreased Interest _0 Down, Depressed, Hopeless _1 PHQ - 2 Score _2 Altered sleeping _3 0 3  Tired, decreased energy _4 0  Change  in appetite _5 0 3  Feeling bad or failure about yourself  _6 Trouble concentrating _7 Moving slowly or fidgety/restless 0 0 3 0 0  Suicidal thoughts 1 - 3 3 0  PHQ-9 Score _8 GAD 7 : Generalized Anxiety Score 10/09/2016 12/10/2015  Nervous, Anxious, on Edge 3 3  Control/stop worrying 3 3  Worry too much - different things 3 3  Trouble relaxing 3 3  Restless 2 3  Easily annoyed or irritable 3 3  Afraid - awful might happen 3 3  Total GAD 7 Score 20 21    ASSESSMENT  AND PLAN: 1. Uncontrolled type 2 diabetes mellitus with nephropathy ([place]) -Given new prescription for glucometer. Discussed the importance of healthy eating habits, regular aerobic exercise (at least 150 minutes a week as tolerated) and medication compliance to achieve or maintain control of diabetes. -Increase in awful and 7030-55 units twice a day, continue Victoza and Januvia. Did not tolerate metformin in the past due to GI upset -Follow up in 1 month with clinical pharmacists. Advised to bring blood sugar readings in with her - POCT glucose (manual entry) - POCT glycosylated hemoglobin (Hb A1C) - insulin NPH-regular Human (NOVOLIN 70/30) (70-30) 100 UNIT/ML injection; Inject 55 Units into the skin 2 (two) times daily with a meal.  Dispense: 20 mL; Refill: 11 - liraglutide (VICTOZA) 18 MG/3ML SOPN; Inject 0.3 mLs (1.8 mg total) into the skin daily.  Dispense: 9 mL; Refill: 11 - Blood Glucose Monitoring Suppl (TRUE METRIX METER) w/Device KIT; 1 each by Does not apply route as needed.  Dispense: 1 kit; Refill: 0  2. Essential hypertension At goal - carvedilol (COREG) 6.25 MG tablet; TAKE 1 TABLET BY MOUTH 2 TIMES DAILY WITH A MEAL.  Dispense: 180 tablet; Refill: 6 - amLODipine (NORVASC) 10 MG tablet; Take 1 tablet (10 mg total) by mouth daily.  Dispense: 90 tablet; Refill: 3 - lisinopril-hydrochlorothiazide (ZESTORETIC) 20-12.5 MG tablet; Take 2 tablets by mouth daily.  Dispense: 180  tablet; Refill: 6  3. Anxiety and depression -Patient declines medical treatment or speaking with a counselor. Reports she is much better compared to several years ago  4. Muscle cramps  - cyclobenzaprine (FLEXERIL) 10 MG tablet; Take 1 tablet (10 mg total) by mouth daily as needed for muscle spasms.  Dispense: 30 tablet; Refill: 1  5. Insomnia, unspecified type Good sleep hygiene discussed including getting in bed around the same time every night, deciding whether she will be sleeping in her living room or bedroom and staying consistent, turning off or lights and sounds 1 she gets in bed,  avoid drinking caffeinated beverages after 6 PM -We agreed to give her very limited supply of Ambien to use as needed. Dosage dropped from 10 mg to 5 mg -last rxn written and filled was 2016 upon review of [facility] - zolpidem (AMBIEN) 5 MG tablet; Take 1 tablet (5 mg total) by mouth at bedtime as needed for sleep.  Dispense: 15 tablet; Refill: 1  6. Hyperlipidemia, unspecified hyperlipidemia type - atorvastatin (LIPITOR) 40 MG tablet; Take 1 tablet (40 mg total) by mouth daily.  Dispense: 90 tablet; Refill: 3  7. Glaucoma, unspecified glaucoma type, unspecified laterality - Ambulatory referral to Ophthalmology   Patient was given the opportunity to ask questions.  Patient verbalized understanding of the plan and was able to repeat key elements of the plan.   Orders Placed This Encounter  Procedures  . Ambulatory referral to Ophthalmology  . POCT glucose (manual entry)  . POCT glycosylated hemoglobin (Hb A1C)     Requested Prescriptions   Signed Prescriptions Disp Refills  . insulin NPH-regular Human (NOVOLIN 70/30) (70-30) 100 UNIT/ML injection 20 mL 11    Sig: Inject 55 Units into the skin 2 (two) times daily with a meal.  . liraglutide (VICTOZA) 18 MG/3ML SOPN 9 mL 11    Sig: Inject 0.3 mLs (1.8 mg total) into the skin daily.  . carvedilol (COREG) 6.25 MG tablet 180 tablet 6    Sig: TAKE 1  TABLET BY MOUTH 2 TIMES DAILY WITH A MEAL.  Marland Kitchen Blood Glucose Monitoring  Suppl (TRUE METRIX METER) w/Device KIT 1 kit 0    Sig: 1 each by Does not apply route as needed.  . cyclobenzaprine (FLEXERIL) 10 MG tablet 30 tablet 1    Sig: Take 1 tablet (10 mg total) by mouth daily as needed for muscle spasms.  Marland Kitchen amLODipine (NORVASC) 10 MG tablet 90 tablet 3    Sig: Take 1 tablet (10 mg total) by mouth daily.  Marland Kitchen lisinopril-hydrochlorothiazide (ZESTORETIC) 20-12.5 MG tablet 180 tablet 6    Sig: Take 2 tablets by mouth daily.  Marland Kitchen atorvastatin (LIPITOR) 40 MG tablet 90 tablet 3    Sig: Take 1 tablet (40 mg total) by mouth daily.  Marland Kitchen zolpidem (AMBIEN) 5 MG tablet 15 tablet 1    Sig: Take 1 tablet (5 mg total) by mouth at bedtime as needed for sleep.    Return in about 2 months (around 12/09/2016).  Karle Plumber, MD, FACP

## 2016-10-09 NOTE — Patient Instructions (Addendum)
Give appointment with Marzetta Board in 1 mth Increase insulin to 55 units twice a day. Try to exercise more.  Insomnia Insomnia is a sleep disorder that makes it difficult to fall asleep or to stay asleep. Insomnia can cause tiredness (fatigue), low energy, difficulty concentrating, mood swings, and poor performance at work or school. There are three different ways to classify insomnia:  Difficulty falling asleep.  Difficulty staying asleep.  Waking up too early in the morning.  Any type of insomnia can be long-term (chronic) or short-term (acute). Both are common. Short-term insomnia usually lasts for three months or less. Chronic insomnia occurs at least three times a week for longer than three months. What are the causes? Insomnia may be caused by another condition, situation, or substance, such as:  Anxiety.  Certain medicines.  Gastroesophageal reflux disease (GERD) or other gastrointestinal conditions.  Asthma or other breathing conditions.  Restless legs syndrome, sleep apnea, or other sleep disorders.  Chronic pain.  Menopause. This may include hot flashes.  Stroke.  Abuse of alcohol, tobacco, or illegal drugs.  Depression.  Caffeine.  Neurological disorders, such as Alzheimer disease.  An overactive thyroid (hyperthyroidism).  The cause of insomnia may not be known. What increases the risk? Risk factors for insomnia include:  Gender. Women are more commonly affected than men.  Age. Insomnia is more common as you get older.  Stress. This may involve your professional or personal life.  Income. Insomnia is more common in people with lower income.  Lack of exercise.  Irregular work schedule or night shifts.  Traveling between different time zones.  What are the signs or symptoms? If you have insomnia, trouble falling asleep or trouble staying asleep is the main symptom. This may lead to other symptoms, such as:  Feeling fatigued.  Feeling nervous about  going to sleep.  Not feeling rested in the morning.  Having trouble concentrating.  Feeling irritable, anxious, or depressed.  How is this treated? Treatment for insomnia depends on the cause. If your insomnia is caused by an underlying condition, treatment will focus on addressing the condition. Treatment may also include:  Medicines to help you sleep.  Counseling or therapy.  Lifestyle adjustments.  Follow these instructions at home:  Take medicines only as directed by your health care provider.  Keep regular sleeping and waking hours. Avoid naps.  Keep a sleep diary to help you and your health care provider figure out what could be causing your insomnia. Include: ? When you sleep. ? When you wake up during the night. ? How well you sleep. ? How rested you feel the next day. ? Any side effects of medicines you are taking. ? What you eat and drink.  Make your bedroom a comfortable place where it is easy to fall asleep: ? Put up shades or special blackout curtains to block light from outside. ? Use a white noise machine to block noise. ? Keep the temperature cool.  Exercise regularly as directed by your health care provider. Avoid exercising right before bedtime.  Use relaxation techniques to manage stress. Ask your health care provider to suggest some techniques that may work well for you. These may include: ? Breathing exercises. ? Routines to release muscle tension. ? Visualizing peaceful scenes.  Cut back on alcohol, caffeinated beverages, and cigarettes, especially close to bedtime. These can disrupt your sleep.  Do not overeat or eat spicy foods right before bedtime. This can lead to digestive discomfort that can make it hard for  you to sleep.  Limit screen use before bedtime. This includes: ? Watching TV. ? Using your smartphone, tablet, and computer.  Stick to a routine. This can help you fall asleep faster. Try to do a quiet activity, brush your teeth, and  go to bed at the same time each night.  Get out of bed if you are still awake after 15 minutes of trying to sleep. Keep the lights down, but try reading or doing a quiet activity. When you feel sleepy, go back to bed.  Make sure that you drive carefully. Avoid driving if you feel very sleepy.  Keep all follow-up appointments as directed by your health care provider. This is important. Contact a health care provider if:  You are tired throughout the day or have trouble in your daily routine due to sleepiness.  You continue to have sleep problems or your sleep problems get worse. Get help right away if:  You have serious thoughts about hurting yourself or someone else. This information is not intended to replace advice given to you by your health care provider. Make sure you discuss any questions you have with your health care provider. Document Released: 12/21/1999 Document Revised: 05/25/2015 Document Reviewed: 09/23/2013 Elsevier Interactive Patient Education  [employer].

## 2016-11-03 ENCOUNTER — Other Ambulatory Visit: Payer: Self-pay

## 2016-11-03 MED ORDER — GLUCOSE BLOOD VI STRP: ORAL_STRIP | 12 refills | Status: DC

## 2016-11-03 MED FILL — $novoLIN 70/30 100 UNITS/ML: (70-30) 100 | 18 days supply | Qty: 20 | Fill #1

## 2016-11-03 MED FILL — $VICTOZA 2-PAK 18MG/3ML PEN: 18 | 30 days supply | Qty: 9 | Fill #0

## 2016-11-06 ENCOUNTER — Other Ambulatory Visit: Payer: Self-pay | Admitting: Pharmacist

## 2016-11-06 DIAGNOSIS — E1136 Type 2 diabetes mellitus with diabetic cataract: Secondary | ICD-10-CM

## 2016-11-06 DIAGNOSIS — E1165 Type 2 diabetes mellitus with hyperglycemia: Principal | ICD-10-CM

## 2016-11-06 DIAGNOSIS — IMO0002 Reserved for concepts with insufficient information to code with codable children: Secondary | ICD-10-CM

## 2016-11-06 DIAGNOSIS — Z794 Long term (current) use of insulin: Principal | ICD-10-CM

## 2016-11-06 MED ORDER — GLUCOSE BLOOD VI STRP: ORAL_STRIP | 12 refills | Status: DC

## 2016-11-06 MED ORDER — "INSULIN SYRINGE-NEEDLE U-100 31G X 15/64"" 0.5 ML MISC": 2 refills | Status: DC

## 2016-11-07 MED FILL — AMLODIPINE BESYLATE 10 MG T: 10 | 30 days supply | Qty: 30 | Fill #0

## 2016-11-07 MED FILL — CYCLOBENZAPRINE 10 MG TAB: 10 | 30 days supply | Qty: 30 | Fill #1

## 2016-11-07 MED FILL — ?CETIRIZINE HCL 10 MG TABLE: 10 | 30 days supply | Qty: 30 | Fill #3

## 2016-11-07 MED FILL — ?ATORVASTATIN 40MG TABLET: 40 | 30 days supply | Qty: 30 | Fill #0

## 2016-11-07 MED FILL — ?CARVEDILOL 6.25 MG TABLET: 6.25 | 30 days supply | Qty: 60 | Fill #1

## 2016-11-07 MED FILL — LISINOPRIL-HCTZ 20-12.5 MG: 20-12.5 | 30 days supply | Qty: 60 | Fill #0

## 2016-11-11 ENCOUNTER — Ambulatory Visit: Payer: Self-pay | Admitting: Pharmacist

## 2016-11-11 NOTE — Progress Notes (Deleted)
    S:     No chief complaint on file.   Patient arrives in good spirits.  Presents for diabetes evaluation, education, and management at the request of Dr. Wynetta Emery. Patient was referred on 10/09/16.  Patient was last seen by Primary Care Provider on 10/09/16.   Patient {Actions; denies-reports:120008} adherence with medications.  Current diabetes medications include: Novolin 70/30 55 units BID, Victoza 1.8 mg daily, Januvia 100 mg daily  Patient {Actions; denies-reports:120008} hypoglycemic events.  Patient reported dietary habits: Eats *** meals/day Breakfast:*** Lunch:*** Dinner:*** Snacks:*** Drinks:***  Patient reported exercise habits:    Patient {Actions; denies-reports:120008} nocturia.  Patient {Actions; denies-reports:120008} neuropathy. Patient {Actions; denies-reports:120008} visual changes. Patient {Actions; denies-reports:120008} self foot exams.    O:  Physical Exam   ROS   Lab Results  Component Value Date   HGBA1C 12.1 10/09/2016   There were no vitals filed for this visit.  Home fasting CBG: ***  2 hour post-prandial/random CBG: ***.  10 year ASCVD risk: ***.  A/P: Diabetes longstanding currently uncontrolled based on A1c of 12.1. Patient {Actions; denies-reports:120008} hypoglycemic events and is able to verbalize appropriate hypoglycemia management plan. Patient {Actions; denies-reports:120008} adherence with medication. Control is suboptimal due to dietary indiscretion and sedentary lifestyle.  Next A1C anticipated January 2019.    Written patient instructions provided.  Total time in face to face counseling *** minutes.   Follow up in Pharmacist Clinic Visit ***.   Patient seen with ***

## 2016-12-09 ENCOUNTER — Ambulatory Visit: Payer: Self-pay | Admitting: Internal Medicine

## 2016-12-22 ENCOUNTER — Ambulatory Visit: Payer: Self-pay | Admitting: Internal Medicine

## 2017-01-08 MED FILL — AMLODIPINE BESYLATE 10 MG T: 10 | 30 days supply | Qty: 30 | Fill #1

## 2017-01-08 MED FILL — $VICTOZA 2-PAK 18MG/3ML PEN: 18 | 30 days supply | Qty: 9 | Fill #1

## 2017-01-08 MED FILL — $novoLIN 70/30 100 UNITS/ML: (70-30) 100 | 18 days supply | Qty: 20 | Fill #2

## 2017-01-08 MED FILL — ?ATORVASTATIN 40MG TABLET: 40 | 30 days supply | Qty: 30 | Fill #1

## 2017-01-22 ENCOUNTER — Ambulatory Visit: Payer: Self-pay | Attending: Internal Medicine | Admitting: Internal Medicine

## 2017-01-22 ENCOUNTER — Encounter: Payer: Self-pay | Admitting: Internal Medicine

## 2017-01-22 VITALS — BP 178/93 | HR 81 | Temp 97.7°F | Resp 16 | Wt 179.0 lb

## 2017-01-22 DIAGNOSIS — F419 Anxiety disorder, unspecified: Secondary | ICD-10-CM | POA: Insufficient documentation

## 2017-01-22 DIAGNOSIS — E1129 Type 2 diabetes mellitus with other diabetic kidney complication: Secondary | ICD-10-CM

## 2017-01-22 DIAGNOSIS — Z1211 Encounter for screening for malignant neoplasm of colon: Secondary | ICD-10-CM | POA: Insufficient documentation

## 2017-01-22 DIAGNOSIS — E1121 Type 2 diabetes mellitus with diabetic nephropathy: Secondary | ICD-10-CM | POA: Insufficient documentation

## 2017-01-22 DIAGNOSIS — IMO0002 Reserved for concepts with insufficient information to code with codable children: Secondary | ICD-10-CM

## 2017-01-22 DIAGNOSIS — G4700 Insomnia, unspecified: Secondary | ICD-10-CM | POA: Insufficient documentation

## 2017-01-22 DIAGNOSIS — I152 Hypertension secondary to endocrine disorders: Secondary | ICD-10-CM | POA: Insufficient documentation

## 2017-01-22 DIAGNOSIS — I10 Essential (primary) hypertension: Secondary | ICD-10-CM | POA: Insufficient documentation

## 2017-01-22 DIAGNOSIS — E1169 Type 2 diabetes mellitus with other specified complication: Secondary | ICD-10-CM | POA: Insufficient documentation

## 2017-01-22 DIAGNOSIS — Z794 Long term (current) use of insulin: Secondary | ICD-10-CM | POA: Insufficient documentation

## 2017-01-22 DIAGNOSIS — R809 Proteinuria, unspecified: Secondary | ICD-10-CM | POA: Insufficient documentation

## 2017-01-22 DIAGNOSIS — H409 Unspecified glaucoma: Secondary | ICD-10-CM | POA: Insufficient documentation

## 2017-01-22 DIAGNOSIS — E1139 Type 2 diabetes mellitus with other diabetic ophthalmic complication: Secondary | ICD-10-CM

## 2017-01-22 DIAGNOSIS — Z79899 Other long term (current) drug therapy: Secondary | ICD-10-CM | POA: Insufficient documentation

## 2017-01-22 DIAGNOSIS — E785 Hyperlipidemia, unspecified: Secondary | ICD-10-CM | POA: Insufficient documentation

## 2017-01-22 DIAGNOSIS — E1165 Type 2 diabetes mellitus with hyperglycemia: Secondary | ICD-10-CM | POA: Insufficient documentation

## 2017-01-22 LAB — POCT GLYCOSYLATED HEMOGLOBIN (HGB A1C): HEMOGLOBIN A1C: 12.2

## 2017-01-22 LAB — GLUCOSE, POCT (MANUAL RESULT ENTRY)
POC Glucose: 61 mg/dl — AB (ref 70–99)
POC Glucose: 68 mg/dl — AB (ref 70–99)

## 2017-01-22 MED ORDER — "INSULIN SYRINGE 31G X 5/16"" 1 ML MISC": 6 refills | Status: AC

## 2017-01-22 MED ORDER — PEN NEEDLES 31G X 6 MM MISC: 6 refills | Status: DC

## 2017-01-22 MED ORDER — INSULIN NPH ISOPHANE & REGULAR (70-30) 100 UNIT/ML ~~LOC~~ SUSP: 60.0000 [IU] | Freq: Two times a day (BID) | SUBCUTANEOUS | 11 refills | Status: DC

## 2017-01-22 MED ORDER — CARVEDILOL 12.5 MG PO TABS: ORAL_TABLET | ORAL | 6 refills | Status: DC

## 2017-01-22 NOTE — Progress Notes (Signed)
Patient ID: Ann Bryan, female    DOB: [birth year]  MRN: [id number]  CC: Diabetes   Subjective: Ann Bryan is a 57 y.o. female who presents for chronic ds management. Her concerns today include:  Patient with history of DM, HTN, HL, seasonal allergies, anxiety/depression, glucoma, Microalbuminuria and insomnia on Ambien  1. DM: got meter but could not afford the stripes Diet: since last visit, she has tried to avoid drinking sodas Exercise: not exercising due to weather. "Has to walk every where I go because I do not have a car." Meds: finding it difficult to do the 55 units because the syringes she has not clearly marked b/w 50-60. So she thinks some days she over shots and other days she may under shoot.  Out of Victozia for 2 wks during the holidays. Got it 1 wk ago. Compliant with Januvia -Occasional hypoglycemia. BS low today in clinic. She did eat BF.  can tell when BS low.  Gets nervous, numbness, cramps  2.  HTN: compliant with Lis/HCTZ, Coreg and Norvasc.  Does not use added salt  Ate pizza and fries last night  Patient Active Problem List   Diagnosis Date Noted  . Chronic seasonal allergic rhinitis 12/10/2015  . Arthralgia of both hands 12/10/2015  . Severe episode of recurrent major depressive disorder, without psychotic features ([place]) 12/10/2015  . Left radial head fracture 10/20/2014  . Abnormality of gait 04/13/2012  . NEPHROPATHY, DIABETIC 09/23/2006  . Hyperlipidemia associated with type 2 diabetes mellitus ([place]) 12/23/2005  . Early onset dysthymia 12/23/2005  . Hypertension associated with diabetes ([place]) 12/23/2005  . DM (diabetes mellitus) type II uncontrolled with eye manifestation ([place]) 01/07/1984     Current Outpatient Medications on File Prior to Visit  Medication Sig Dispense Refill  . amLODipine (NORVASC) 10 MG tablet Take 1 tablet (10 mg total) by mouth daily. 90 tablet 3  . atorvastatin (LIPITOR) 40 MG tablet Take 1 tablet (40 mg total) by mouth  daily. 90 tablet 3  . carvedilol (COREG) 6.25 MG tablet TAKE 1 TABLET BY MOUTH 2 TIMES DAILY WITH A MEAL. 180 tablet 6  . cetirizine (ZYRTEC) 10 MG tablet Take 1 tablet (10 mg total) by mouth daily. 30 tablet 11  . cyclobenzaprine (FLEXERIL) 10 MG tablet Take 1 tablet (10 mg total) by mouth daily as needed for muscle spasms. 30 tablet 1  . insulin NPH-regular Human (NOVOLIN 70/30) (70-30) 100 UNIT/ML injection Inject 55 Units into the skin 2 (two) times daily with a meal. 20 mL 11  . liraglutide (VICTOZA) 18 MG/3ML SOPN Inject 0.3 mLs (1.8 mg total) into the skin daily. 9 mL 11  . lisinopril-hydrochlorothiazide (ZESTORETIC) 20-12.5 MG tablet Take 2 tablets by mouth daily. 180 tablet 6  . sitaGLIPtin (JANUVIA) 100 MG tablet Take 1 tablet (100 mg total) by mouth daily. 90 tablet 3  . zolpidem (AMBIEN) 5 MG tablet Take 1 tablet (5 mg total) by mouth at bedtime as needed for sleep. 15 tablet 1  . Blood Glucose Monitoring Suppl (TRUE METRIX METER) w/Device KIT 1 each by Does not apply route as needed. 1 kit 0  . glucose blood (TRUE METRIX BLOOD GLUCOSE TEST) test strip 1 each by Other route 3 (three) times daily. 100 each 11  . glucose blood (TRUE METRIX BLOOD GLUCOSE TEST) test strip Use as instructed 100 each 12  . insulin NPH-regular Human (NOVOLIN 70/30) (70-30) 100 UNIT/ML injection Inject 50 Units into the skin 2 (two) times daily with  a meal. 30 mL 0  . Insulin Syringe-Needle U-100 (BD INSULIN SYRINGE ULTRAFINE) 31G X 15/64" 0.5 ML MISC Use as directed 100 each 2  . Insulin Syringes, Disposable, U-100 0.5 ML MISC 1 each by Does not apply route 2 (two) times daily. 100 each 8  . timolol (BETIMOL) 0.5 % ophthalmic solution Place 1 drop into both eyes daily. (Patient not taking: Reported on 12/10/2015) 10 mL 3  . TRUEPLUS LANCETS 28G MISC 1 each by Does not apply route 3 (three) times daily. 100 each 12   No current facility-administered medications on file prior to visit.     Allergies  Allergen  Reactions  . Doxycycline     REACTION: N/V    Social History   Socioeconomic History  . Marital status: Single    Spouse name: Not on file  . Number of children: Not on file  . Years of education: Not on file  . Highest education level: Not on file  Social Needs  . Financial resource strain: Not on file  . Food insecurity - worry: Not on file  . Food insecurity - inability: Not on file  . Transportation needs - medical: Not on file  . Transportation needs - non-medical: Not on file  Occupational History  . Not on file  Tobacco Use  . Smoking status: Never Smoker  . Smokeless tobacco: Never Used  Substance and Sexual Activity  . Alcohol use: No  . Drug use: No  . Sexual activity: Not Currently  Other Topics Concern  . Not on file  Social History Narrative  . Not on file    Family History  Problem Relation Age of Onset  . Diabetes Mother   . Stroke Mother   . Hypertension Mother   . Alcohol abuse Mother   . Depression Father        Committed suicide  . Alcohol abuse Maternal Uncle     Past Surgical History:  Procedure Laterality Date  . ABDOMINAL HYSTERECTOMY   2004    ROS: Review of Systems Neg except as above PHYSICAL EXAM: BP (!) 178/93   Pulse 81   Temp 97.7 F (36.5 C) (Oral)   Resp 16   Wt 179 lb (81.2 kg)   LMP 03/13/2002   SpO2 97%   BMI 34.96 kg/m   Wt Readings from Last 3 Encounters:  01/22/17 179 lb (81.2 kg)  10/09/16 172 lb (78 kg)  12/10/15 173 lb (78.5 kg)    Physical Exam General appearance - alert, well appearing, and in no distress Mental status - alert, oriented to person, place, and time, normal mood, behavior, speech, dress, motor activity, and thought processes Neck - supple, no significant adenopathy Chest - clear to auscultation, no wheezes, rales or rhonchi, symmetric air entry Heart - normal rate, regular rhythm, normal S1, S2, no murmurs, rubs, clicks or gallops Extremities - peripheral pulses normal, no pedal edema,  no clubbing or cyanosis Feet: no ulcers or callous.  Toenails a little overgrown.  BS: 61/A1C 12.2 Repeat BS after 1 small can apple juice 68.  Pt given a 2nd can and did not want to wait for Recheck BS stating she is is leaving here right now to go get something to eat.  Pt give a 3rd can of juice to take with her just in case  ASSESSMENT AND PLAN: 1. Uncontrolled type 2 diabetes mellitus with microalbuminuria, with long-term current use of insulin (HCC) -Discussed hypoglycemia and how to avoid and how  to treat.  Encourage her to purchase and keep a pack of glucose tabs in her pocketbook. -Discussed changing insulin to Lantus and NovoLog but patient declined stating she was on them in the past and felt they just did not work for her.  She prefers to stay on the Novolin 70/30.  Advised to increase dose to 60 units twice a day. -Purchase glucose strips when she can afford and check blood sugars twice a day. -Encourage her to increase physical activity and went over some activities that she can do indoors. Given 2 cans of juice today before she left and another to take with her - POCT glycosylated hemoglobin (Hb A1C) - POCT glucose (manual entry) - CBC - Lipid panel - Comprehensive metabolic panel - Microalbumin / creatinine urine ratio - Insulin Pen Needle (PEN NEEDLES) 31G X 6 MM MISC; Use as directed  Dispense: 100 each; Refill: 6  2. Essential hypertension Not at goal. -DASH Diet discussed and encouraged Increase carvedilol - carvedilol (COREG) 12.5 MG tablet; TAKE 1 TABLET BY MOUTH 2 TIMES DAILY WITH A MEAL.  Dispense: 60 tablet; Refill: 6  3. Microalbuminuria   4. Uncontrolled type 2 diabetes mellitus with nephropathy (HCC) - insulin NPH-regular Human (NOVOLIN 70/30) (70-30) 100 UNIT/ML injection; Inject 60 Units into the skin 2 (two) times daily with a meal.  Dispense: 20 mL; Refill: 11 - POCT glucose (manual entry)  5. Colon cancer screening - Fecal occult blood,  imunochemical([facility])   Patient was given the opportunity to ask questions.  Patient verbalized understanding of the plan and was able to repeat key elements of the plan.   No orders of the defined types were placed in this encounter.    Requested Prescriptions    No prescriptions requested or ordered in this encounter    F/u in 2 mth Ann Plumber, MD, Rosalita Chessman

## 2017-01-22 NOTE — Patient Instructions (Signed)
Increase insulin to 60 units twice a day with meals.  Increase Carvedilol to 12.5 mg twice a day. Work on improving eating habits. Try to exercise at home.  Follow a Healthy Eating Plan - You can do it! Limit sugary drinks.  Avoid sodas, sweet tea, sport or energy drinks, or fruit drinks.  Drink water, lo-fat milk, or diet drinks. Limit snack foods.   Cut back on candy, cake, cookies, chips, ice cream.  These are a special treat, only in small amounts. Eat plenty of vegetables.  Especially dark green, red, and orange vegetables. Aim for at least 3 servings a day. More is better! Include fruit in your daily diet.  Whole fruit is much healthier than fruit juice! Limit "white" bread, "white" pasta, "white" rice.   Choose "100% whole grain" products, brown or wild rice. Avoid fatty meats. Try "Meatless Monday" and choose eggs or beans one day a week.  When eating meat, choose lean meats like chicken, [place], and fish.  Grill, broil, or bake meats instead of frying, and eat poultry without the skin. Eat less salt.  Avoid frozen pizzas, frozen dinners and salty foods.  Use seasonings other than salt in cooking.  This can help blood pressure and keep you from swelling Beer, wine and liquor have calories.  If you can safely drink alcohol, limit to 1 drink per day for women, 2 drinks for men

## 2017-01-23 LAB — CBC
HEMATOCRIT: 39.1 % (ref 34.0–46.6)
HEMOGLOBIN: 12.7 g/dL (ref 11.1–15.9)
MCH: 27 pg (ref 26.6–33.0)
MCHC: 32.5 g/dL (ref 31.5–35.7)
MCV: 83 fL (ref 79–97)
Platelets: 328 10*3/uL (ref 150–379)
RBC: 4.71 x10E6/uL (ref 3.77–5.28)
RDW: 13.6 % (ref 12.3–15.4)
WBC: 8.4 10*3/uL (ref 3.4–10.8)

## 2017-01-23 LAB — COMPREHENSIVE METABOLIC PANEL
A/G RATIO: 1.7 (ref 1.2–2.2)
ALK PHOS: 236 IU/L — AB (ref 39–117)
ALT: 72 IU/L — AB (ref 0–32)
AST: 61 IU/L — AB (ref 0–40)
Albumin: 4 g/dL (ref 3.5–5.5)
BUN/Creatinine Ratio: 14 (ref 9–23)
BUN: 32 mg/dL — ABNORMAL HIGH (ref 6–24)
CHLORIDE: 106 mmol/L (ref 96–106)
CO2: 21 mmol/L (ref 20–29)
Calcium: 9.7 mg/dL (ref 8.7–10.2)
Creatinine, Ser: 2.22 mg/dL — ABNORMAL HIGH (ref 0.57–1.00)
GFR calc Af Amer: 28 mL/min/{1.73_m2} — ABNORMAL LOW (ref >59)
GFR calc non Af Amer: 24 mL/min/{1.73_m2} — ABNORMAL LOW (ref >59)
Globulin, Total: 2.3 g/dL (ref 1.5–4.5)
Glucose: 58 mg/dL — ABNORMAL LOW (ref 65–99)
Potassium: 4.4 mmol/L (ref 3.5–5.2)
Sodium: 144 mmol/L (ref 134–144)
Total Protein: 6.3 g/dL (ref 6.0–8.5)

## 2017-01-23 LAB — MICROALBUMIN / CREATININE URINE RATIO
Creatinine, Urine: 67.7 mg/dL
MICROALB/CREAT RATIO: 4556.9 mg/g{creat} — AB (ref 0.0–30.0)
MICROALBUM., U, RANDOM: 3085 ug/mL

## 2017-01-23 LAB — LIPID PANEL
CHOL/HDL RATIO: 3.7 ratio (ref 0.0–4.4)
Cholesterol, Total: 206 mg/dL — ABNORMAL HIGH (ref 100–199)
HDL: 55 mg/dL (ref >39)
LDL CALC: 133 mg/dL — AB (ref 0–99)
Triglycerides: 89 mg/dL (ref 0–149)
VLDL CHOLESTEROL CAL: 18 mg/dL (ref 5–40)

## 2017-01-25 ENCOUNTER — Telehealth: Payer: Self-pay | Admitting: Internal Medicine

## 2017-01-25 DIAGNOSIS — N184 Chronic kidney disease, stage 4 (severe): Secondary | ICD-10-CM

## 2017-01-25 DIAGNOSIS — R809 Proteinuria, unspecified: Secondary | ICD-10-CM

## 2017-01-25 NOTE — Telephone Encounter (Signed)
Phone call placed to patient today to discuss lab results.  I got automated VM.  I left a message stating who I am and that I was calling to go over lab results.  I request that she call the office tomorrow and asked to speak with my nurse to get these results and that it was very important that she does so.  When patient calls back please let her know that her kidney function has worsened and she has a significant amount of protein in the urine which also lets [place] know that her kidney function has declined.  I will refer her to a nephrologist.  She should apply for the orange card and [facility] discount ASAP. Given her kidney function the Januvia needs to be stopped. Cholesterol level is elevated.  Issue taking the Lipitor. Liver enzymes are mildly elevated which may be due to fatty deposits in the liver associated with diabetes or due to the effects of Lipitor.  We do not have to stop Lipitor but will recheck liver function in about 4-6 months.  Healthy eating and regular exercise as discussed on recent visit are encouraged.

## 2017-01-26 NOTE — Telephone Encounter (Signed)
Pt is aware of results pt doesn't have any questions or concerns. Pt states she is taking the lipitor. Pt is aware to stop the Januvia. Pt is aware that the nephrologist will be giving her a call to schedule an appointment

## 2017-02-16 MED FILL — $novoLIN 70/30 100 UNITS/ML: (70-30) 100 | 18 days supply | Qty: 20 | Fill #3

## 2017-02-16 MED FILL — ?CARVEDILOL 6.25 MG TABLET: 6.25 | 30 days supply | Qty: 60 | Fill #2

## 2017-02-16 MED FILL — $VICTOZA 2-PAK 18MG/3ML PEN: 18 | 30 days supply | Qty: 9 | Fill #2

## 2017-02-16 MED FILL — ?ATORVASTATIN 40MG TABLET: 40 | 30 days supply | Qty: 30 | Fill #2

## 2017-02-16 MED FILL — ?AMLODIPINE BESYLATE 10MG T: 10 | 30 days supply | Qty: 30 | Fill #2

## 2017-02-16 MED FILL — LISINOPRIL-HCTZ 20-12.5 MG: 20-12.5 | 30 days supply | Qty: 60 | Fill #1

## 2017-02-17 ENCOUNTER — Other Ambulatory Visit: Payer: Self-pay | Admitting: Pharmacist

## 2017-02-17 DIAGNOSIS — J302 Other seasonal allergic rhinitis: Secondary | ICD-10-CM

## 2017-02-17 MED ORDER — CETIRIZINE HCL 10 MG PO TABS: 10.0000 mg | ORAL_TABLET | Freq: Every day | ORAL | 2 refills | Status: DC

## 2017-02-17 MED FILL — ?CETIRIZINE HCL 10 MG TABLE: 10 | 30 days supply | Qty: 30 | Fill #0

## 2017-02-22 LAB — FECAL OCCULT BLOOD, IMMUNOCHEMICAL: Fecal Occult Bld: NEGATIVE

## 2017-03-18 MED FILL — ?CARVEDILOL 6.25 MG TABLET: 6.25 | 30 days supply | Qty: 60 | Fill #3

## 2017-03-18 MED FILL — ?ATORVASTATIN 40MG TABLET: 40 | 30 days supply | Qty: 30 | Fill #3

## 2017-03-18 MED FILL — $novoLIN 70/30 100 UNITS/ML: (70-30) 100 | 18 days supply | Qty: 20 | Fill #4

## 2017-03-18 MED FILL — $VICTOZA 2-PAK 18MG/3ML PEN: 18 | 30 days supply | Qty: 9 | Fill #3

## 2017-03-26 ENCOUNTER — Ambulatory Visit: Payer: Self-pay | Admitting: Internal Medicine

## 2017-03-26 MED FILL — AMLODIPINE BESYLATE 10 MG T: 10 | 30 days supply | Qty: 30 | Fill #3

## 2017-03-26 MED FILL — LISINOPRIL-HCTZ 20-12.5 MG: 20-12.5 | 30 days supply | Qty: 60 | Fill #2

## 2017-03-27 ENCOUNTER — Ambulatory Visit: Payer: Self-pay | Attending: Internal Medicine

## 2017-04-01 ENCOUNTER — Ambulatory Visit: Payer: Self-pay | Attending: Internal Medicine

## 2017-04-06 MED FILL — $novoLIN 70/30 100 UNITS/ML: (70-30) 100 | 25 days supply | Qty: 30 | Fill #0

## 2017-04-09 ENCOUNTER — Other Ambulatory Visit: Payer: Self-pay | Admitting: Internal Medicine

## 2017-04-09 DIAGNOSIS — G4700 Insomnia, unspecified: Secondary | ICD-10-CM

## 2017-04-10 NOTE — Telephone Encounter (Addendum)
Pt re Requesting RF on Ambien. Lind reviewed.  RF to be done.  Pt to pick up

## 2017-05-04 ENCOUNTER — Ambulatory Visit: Payer: Self-pay | Admitting: Internal Medicine

## 2017-05-08 MED FILL — AMLODIPINE BESYLATE 10 MG T: 10 | 30 days supply | Qty: 30 | Fill #4

## 2017-05-08 MED FILL — $novoLIN 70/30 100 UNITS/ML: (70-30) 100 | 25 days supply | Qty: 30 | Fill #1

## 2017-05-08 MED FILL — LISINOPRIL-HCTZ 20-12.5 MG: 20-12.5 | 30 days supply | Qty: 60 | Fill #3

## 2017-05-08 MED FILL — ATORVASTATIN CALCIUM 40 MG: 40 | 30 days supply | Qty: 30 | Fill #4

## 2017-05-08 MED FILL — CARVEDILOL 6.25 MG TABLET: 6.25 | 30 days supply | Qty: 60 | Fill #4

## 2017-05-08 MED FILL — $VICTOZA 2-PAK 18MG/3ML PEN: 18 | 30 days supply | Qty: 9 | Fill #4

## 2017-06-02 MED FILL — ATORVASTATIN CALCIUM 40 MG: 40 | 30 days supply | Qty: 30 | Fill #5

## 2017-06-02 MED FILL — AMLODIPINE BESYLATE 10 MG T: 10 | 30 days supply | Qty: 30 | Fill #5

## 2017-06-02 MED FILL — TRUE METRIX TEST STRIP: 30 days supply | Qty: 100 | Fill #0

## 2017-06-02 MED FILL — $novoLIN 70/30 100 UNITS/ML: (70-30) 100 | 25 days supply | Qty: 30 | Fill #2

## 2017-06-02 MED FILL — $VICTOZA 2-PAK 18MG/3ML PEN: 18 | 30 days supply | Qty: 9 | Fill #5

## 2017-06-04 ENCOUNTER — Ambulatory Visit: Payer: Self-pay | Attending: Internal Medicine | Admitting: Internal Medicine

## 2017-06-04 ENCOUNTER — Encounter: Payer: Self-pay | Admitting: Internal Medicine

## 2017-06-04 VITALS — BP 173/96 | HR 82 | Temp 98.2°F | Resp 16 | Wt 174.8 lb

## 2017-06-04 DIAGNOSIS — E785 Hyperlipidemia, unspecified: Secondary | ICD-10-CM | POA: Insufficient documentation

## 2017-06-04 DIAGNOSIS — Z1239 Encounter for other screening for malignant neoplasm of breast: Secondary | ICD-10-CM

## 2017-06-04 DIAGNOSIS — Z794 Long term (current) use of insulin: Secondary | ICD-10-CM | POA: Insufficient documentation

## 2017-06-04 DIAGNOSIS — Z79899 Other long term (current) drug therapy: Secondary | ICD-10-CM | POA: Insufficient documentation

## 2017-06-04 DIAGNOSIS — Z1231 Encounter for screening mammogram for malignant neoplasm of breast: Secondary | ICD-10-CM

## 2017-06-04 DIAGNOSIS — F419 Anxiety disorder, unspecified: Secondary | ICD-10-CM | POA: Insufficient documentation

## 2017-06-04 DIAGNOSIS — E1139 Type 2 diabetes mellitus with other diabetic ophthalmic complication: Secondary | ICD-10-CM

## 2017-06-04 DIAGNOSIS — I129 Hypertensive chronic kidney disease with stage 1 through stage 4 chronic kidney disease, or unspecified chronic kidney disease: Secondary | ICD-10-CM | POA: Insufficient documentation

## 2017-06-04 DIAGNOSIS — R7989 Other specified abnormal findings of blood chemistry: Secondary | ICD-10-CM

## 2017-06-04 DIAGNOSIS — G4700 Insomnia, unspecified: Secondary | ICD-10-CM

## 2017-06-04 DIAGNOSIS — IMO0002 Reserved for concepts with insufficient information to code with codable children: Secondary | ICD-10-CM

## 2017-06-04 DIAGNOSIS — E1165 Type 2 diabetes mellitus with hyperglycemia: Secondary | ICD-10-CM

## 2017-06-04 DIAGNOSIS — N184 Chronic kidney disease, stage 4 (severe): Secondary | ICD-10-CM

## 2017-06-04 DIAGNOSIS — E1122 Type 2 diabetes mellitus with diabetic chronic kidney disease: Secondary | ICD-10-CM | POA: Insufficient documentation

## 2017-06-04 DIAGNOSIS — E11319 Type 2 diabetes mellitus with unspecified diabetic retinopathy without macular edema: Secondary | ICD-10-CM | POA: Insufficient documentation

## 2017-06-04 DIAGNOSIS — I10 Essential (primary) hypertension: Secondary | ICD-10-CM

## 2017-06-04 DIAGNOSIS — E1121 Type 2 diabetes mellitus with diabetic nephropathy: Secondary | ICD-10-CM

## 2017-06-04 DIAGNOSIS — E1136 Type 2 diabetes mellitus with diabetic cataract: Secondary | ICD-10-CM | POA: Insufficient documentation

## 2017-06-04 DIAGNOSIS — R945 Abnormal results of liver function studies: Secondary | ICD-10-CM

## 2017-06-04 DIAGNOSIS — Z881 Allergy status to other antibiotic agents status: Secondary | ICD-10-CM | POA: Insufficient documentation

## 2017-06-04 LAB — POCT GLYCOSYLATED HEMOGLOBIN (HGB A1C): HBA1C, POC (CONTROLLED DIABETIC RANGE): 9.3 % — AB (ref 0.0–7.0)

## 2017-06-04 LAB — GLUCOSE, POCT (MANUAL RESULT ENTRY): POC GLUCOSE: 129 mg/dL — AB (ref 70–99)

## 2017-06-04 MED ORDER — ZOLPIDEM TARTRATE 5 MG PO TABS: 5.0000 mg | ORAL_TABLET | Freq: Every evening | ORAL | 1 refills | Status: DC | PRN

## 2017-06-04 MED ORDER — CARVEDILOL 25 MG PO TABS: ORAL_TABLET | ORAL | 6 refills | Status: DC

## 2017-06-04 NOTE — Patient Instructions (Signed)
Increase Carvedilol to 25 mg twice a day.  Please get forms for charity care with [name] and fill out.    Try to check blood sugars at least once a day.

## 2017-06-04 NOTE — Progress Notes (Addendum)
Patient ID: KANDIE KEIPER, female    DOB: Dec 29, 1960  MRN: [id number]  CC: Diabetes and Hypertension   Subjective: Ann Bryan is a 57 y.o. female who presents for chronic ds management Her concerns today include:  Patient with history of DM, HTN, HL, seasonal allergies, anxiety/depression, glucoma, nephrotic range proteinuria and insomnia  HTN:  Forgot to take evening dose of Coreg and Lisinopril/HCTZ last evening but take meds daily and limits salt No device to check BP  Macroalbumin:  She did receive her results from last visit.  The amount of protein in her urine increase from 1.5 g 2 years ago to 4.5 g .  Creatinine was 2.2 with estimated GFR of 28 putting her in the range for chronic kidney disease stage IV.  She was referred to nephrology but does not have insurance.  They do not take the orange card of cone discount.  She was provided information by referral coordinator about cost to see [facility] versus applying for financial aid through [facility].  She just got a job and plans to save money to pay for her visit.  She has not started the application for financial aid through [facility] as yet.  She was reportedly denied Medicaid in the past.  DM: walking to and from work 3 days a wk.  Does a lot of walking at work.  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  Eating habits:  "Now that I'm working, I'm not sitting at home eating all day."  Dec vision RT eye.  Has cataract RT, glaucoma and retinopathy BL.  Had laser sugery RT 2001  HL: abnormal LFTs on recent blood test with mild elevation AST and ALT  Insomnia: She reports better sleep hygiene.  She is getting in bed around the same time every evening and turning off all lights and sounds.  However, on the days that she works she gets home in the afternoon and is so tired that she takes a nap  until about 7- 8:00 in the evening.  She then has problems falling asleep later in the night in order to wake up early the next morning to return to work.  It is on those days that she finds herself having to take the Ambien.  She is requesting a refill to use sparingly as she has been doing. Patient Active Problem List   Diagnosis Date Noted  . Microalbuminuria 01/22/2017  . Chronic seasonal allergic rhinitis 12/10/2015  . Arthralgia of both hands 12/10/2015  . Severe episode of recurrent major depressive disorder, without psychotic features ([place]) 12/10/2015  . Left radial head fracture 10/20/2014  . Abnormality of gait 04/13/2012  . NEPHROPATHY, DIABETIC 09/23/2006  . Hyperlipidemia associated with type 2 diabetes mellitus ([place]) 12/23/2005  . Early onset dysthymia 12/23/2005  . Hypertension associated with diabetes ([place]) 12/23/2005  . DM (diabetes mellitus) type II uncontrolled with eye manifestation ([place]) 01/07/1984     Current Outpatient Medications on File Prior to Visit  Medication Sig Dispense Refill  . amLODipine (NORVASC) 10 MG tablet Take 1 tablet (10 mg total) by mouth daily. 90 tablet 3  . atorvastatin (LIPITOR) 40 MG tablet Take 1 tablet (40 mg total) by mouth daily. 90 tablet 3  . Blood Glucose Monitoring Suppl (TRUE METRIX METER) w/Device KIT 1 each by Does not apply route as needed. 1 kit 0  .  cetirizine (ZYRTEC) 10 MG tablet Take 1 tablet (10 mg total) by mouth daily. 30 tablet 2  . cyclobenzaprine (FLEXERIL) 10 MG tablet Take 1 tablet (10 mg total) by mouth daily as needed for muscle spasms. 30 tablet 1  . glucose blood (TRUE METRIX BLOOD GLUCOSE TEST) test strip 1 each by Other route 3 (three) times daily. 100 each 11  . glucose blood (TRUE METRIX BLOOD GLUCOSE TEST) test strip Use as instructed 100 each 12  . insulin NPH-regular Human (NOVOLIN 70/30) (70-30) 100 UNIT/ML injection Inject 60 Units into the skin 2 (two) times daily with a meal. 20 mL 11  . Insulin Pen  Needle (PEN NEEDLES) 31G X 6 MM MISC Use as directed 100 each 6  . Insulin Syringe-Needle U-100 (INSULIN SYRINGE 1CC/31GX5/16") 31G X 5/16" 1 ML MISC Use as directed.  BD 72m x 31G needles 100 each 6  . Insulin Syringes, Disposable, U-100 0.5 ML MISC 1 each by Does not apply route 2 (two) times daily. 100 each 8  . liraglutide (VICTOZA) 18 MG/3ML SOPN Inject 0.3 mLs (1.8 mg total) into the skin daily. 9 mL 11  . lisinopril-hydrochlorothiazide (ZESTORETIC) 20-12.5 MG tablet Take 2 tablets by mouth daily. 180 tablet 6  . timolol (BETIMOL) 0.5 % ophthalmic solution Place 1 drop into both eyes daily. (Patient not taking: Reported on 12/10/2015) 10 mL 3  . TRUEPLUS LANCETS 28G MISC 1 each by Does not apply route 3 (three) times daily. 100 each 12   No current facility-administered medications on file prior to visit.     Allergies  Allergen Reactions  . Doxycycline     REACTION: N/V    Social History   Socioeconomic History  . Marital status: Single    Spouse name: Not on file  . Number of children: Not on file  . Years of education: Not on file  . Highest education level: Not on file  Occupational History  . Not on file  Social Needs  . Financial resource strain: Not on file  . Food insecurity:    Worry: Not on file    Inability: Not on file  . Transportation needs:    Medical: Not on file    Non-medical: Not on file  Tobacco Use  . Smoking status: Never Smoker  . Smokeless tobacco: Never Used  Substance and Sexual Activity  . Alcohol use: No  . Drug use: No  . Sexual activity: Not Currently  Lifestyle  . Physical activity:    Days per week: Not on file    Minutes per session: Not on file  . Stress: Not on file  Relationships  . Social connections:    Talks on phone: Not on file    Gets together: Not on file    Attends religious service: Not on file    Active member of club or organization: Not on file    Attends meetings of clubs or organizations: Not on file     Relationship status: Not on file  . Intimate partner violence:    Fear of current or ex partner: Not on file    Emotionally abused: Not on file    Physically abused: Not on file    Forced sexual activity: Not on file  Other Topics Concern  . Not on file  Social History Narrative  . Not on file    Family History  Problem Relation Age of Onset  . Diabetes Mother   . Stroke Mother   . Hypertension  Mother   . Alcohol abuse Mother   . Depression Father        Committed suicide  . Alcohol abuse Maternal Uncle     Past Surgical History:  Procedure Laterality Date  . ABDOMINAL HYSTERECTOMY   2004    ROS: Review of Systems Negative except as stated above PHYSICAL EXAM: BP (!) 173/96   Pulse 82   Temp 98.2 F (36.8 C) (Oral)   Resp 16   Wt 174 lb 12.8 oz (79.3 kg)   LMP 03/13/2002   SpO2 96%   BMI 34.14 kg/m   Wt Readings from Last 3 Encounters:  06/04/17 174 lb 12.8 oz (79.3 kg)  01/22/17 179 lb (81.2 kg)  10/09/16 172 lb (78 kg)    Physical Exam  General appearance - alert, well appearing, and in no distress Mental status - alert, oriented to person, place, and time, normal mood, behavior, speech, dress, motor activity, and thought processes Neck - supple, no significant adenopathy Chest - clear to auscultation, no wheezes, rales or rhonchi, symmetric air entry Heart - normal rate, regular rhythm, normal S1, S2, no murmurs, rubs, clicks or gallops Extremities - no LE edema  Results for orders placed or performed in visit on 06/04/17  Comprehensive metabolic panel  Result Value Ref Range   Glucose 104 (H) 65 - 99 mg/dL   BUN 39 (H) 6 - 24 mg/dL   Creatinine, Ser 2.48 (H) 0.57 - 1.00 mg/dL   GFR calc non Af Amer 21 (L) >59 mL/min/1.73   GFR calc Af Amer 24 (L) >59 mL/min/1.73   BUN/Creatinine Ratio 16 9 - 23   Sodium 143 134 - 144 mmol/L   Potassium 4.7 3.5 - 5.2 mmol/L   Chloride 105 96 - 106 mmol/L   CO2 22 20 - 29 mmol/L   Calcium 9.7 8.7 - 10.2 mg/dL    Total Protein 5.9 (L) 6.0 - 8.5 g/dL   Albumin 4.0 3.5 - 5.5 g/dL   Globulin, Total 1.9 1.5 - 4.5 g/dL   Albumin/Globulin Ratio 2.1 1.2 - 2.2   Bilirubin Total 0.2 0.0 - 1.2 mg/dL   Alkaline Phosphatase 189 (H) 39 - 117 IU/L   AST 27 0 - 40 IU/L   ALT 48 (H) 0 - 32 IU/L  Microalbumin / creatinine urine ratio  Result Value Ref Range   Creatinine, Urine WILL FOLLOW    Microalbumin, Urine WILL FOLLOW    Microalb/Creat Ratio WILL FOLLOW   POCT glucose (manual entry)  Result Value Ref Range   POC Glucose 129 (A) 70 - 99 mg/dl  POCT glycosylated hemoglobin (Hb A1C)  Result Value Ref Range   Hemoglobin A1C  4.0 - 5.6 %   HbA1c, POC (prediabetic range)  5.7 - 6.4 %   HbA1c, POC (controlled diabetic range) 9.3 (A) 0.0 - 7.0 %      ASSESSMENT AND PLAN: 1. DM (diabetes mellitus) type II uncontrolled with eye manifestation (HCC) A1c has significantly improved with less snacking and being more active now that she is working.  She plans to purchase strips today so that she can check her blood sugars. -She needs a diabetic eye exam but unable to afford at this time - POCT glucose (manual entry) - POCT glycosylated hemoglobin (Hb A1C)  2. Diabetic nephropathy associated with type 2 diabetes mellitus ([place]) She has nephrotic range proteinuria with CKD stage IV.  She is on maximum dose of lisinopril. Advised her to apply for financial assistance through [facility]. Will also have asked caseworker touch base with her to see whether legal aid can assist her with getting Medicaid - Comprehensive metabolic panel - Microalbumin / creatinine urine ratio  3. Insomnia, unspecified type Commended her on improving on sleep hygiene.  On days that she work, I recommend trying to stay awake when she gets off of work until around 8 PM and then getting in bed early at that time. Discuss possible side effects of Ambien including sleepwalking, sleep eating and sleep driving. Discussed controlled  substance prescribing agreement for non-opioid medications. I had her repeat this document and signed [place] appropriate - zolpidem (AMBIEN) 5 MG tablet; Take 1 tablet (5 mg total) by mouth at bedtime as needed. Each prescription to last a month.  Dispense: 12 tablet; Refill: 1  4. Breast cancer screening - MM Digital Screening; Future  5. Essential hypertension Not at goal.  Increase carvedilol to 25 mg twice a day.  Try to limit salt in the foods as much as possible - carvedilol (COREG) 25 MG tablet; TAKE 1 TABLET BY MOUTH 2 TIMES DAILY WITH A MEAL.  Dispense: 60 tablet; Refill: 6  6. Abnormal LFTs -Likely due to statin versus fatty liver. - Comprehensive metabolic panel  Patient was given the opportunity to ask questions.  Patient verbalized understanding of the plan and was able to repeat key elements of the plan.   Orders Placed This Encounter  Procedures  . MM Digital Screening  . Comprehensive metabolic panel  . Microalbumin / creatinine urine ratio  . POCT glucose (manual entry)  . POCT glycosylated hemoglobin (Hb A1C)     Requested Prescriptions   Signed Prescriptions Disp Refills  . zolpidem (AMBIEN) 5 MG tablet 12 tablet 1    Sig: Take 1 tablet (5 mg total) by mouth at bedtime as needed. Each prescription to last a month.  . carvedilol (COREG) 25 MG tablet 60 tablet 6    Sig: TAKE 1 TABLET BY MOUTH 2 TIMES DAILY WITH A MEAL.    Return in about 3 months (around 09/04/2017).  Karle Plumber, MD, FACP

## 2017-06-05 DIAGNOSIS — G4700 Insomnia, unspecified: Secondary | ICD-10-CM | POA: Insufficient documentation

## 2017-06-05 DIAGNOSIS — E1121 Type 2 diabetes mellitus with diabetic nephropathy: Secondary | ICD-10-CM | POA: Insufficient documentation

## 2017-06-05 DIAGNOSIS — N184 Chronic kidney disease, stage 4 (severe): Secondary | ICD-10-CM | POA: Insufficient documentation

## 2017-06-05 LAB — COMPREHENSIVE METABOLIC PANEL
A/G RATIO: 2.1 (ref 1.2–2.2)
ALBUMIN: 4 g/dL (ref 3.5–5.5)
ALK PHOS: 189 IU/L — AB (ref 39–117)
ALT: 48 IU/L — ABNORMAL HIGH (ref 0–32)
AST: 27 IU/L (ref 0–40)
BILIRUBIN TOTAL: 0.2 mg/dL (ref 0.0–1.2)
BUN / CREAT RATIO: 16 (ref 9–23)
BUN: 39 mg/dL — ABNORMAL HIGH (ref 6–24)
CHLORIDE: 105 mmol/L (ref 96–106)
CO2: 22 mmol/L (ref 20–29)
Calcium: 9.7 mg/dL (ref 8.7–10.2)
Creatinine, Ser: 2.48 mg/dL — ABNORMAL HIGH (ref 0.57–1.00)
GFR calc Af Amer: 24 mL/min/{1.73_m2} — ABNORMAL LOW (ref >59)
GFR calc non Af Amer: 21 mL/min/{1.73_m2} — ABNORMAL LOW (ref >59)
GLOBULIN, TOTAL: 1.9 g/dL (ref 1.5–4.5)
Glucose: 104 mg/dL — ABNORMAL HIGH (ref 65–99)
POTASSIUM: 4.7 mmol/L (ref 3.5–5.2)
SODIUM: 143 mmol/L (ref 134–144)
Total Protein: 5.9 g/dL — ABNORMAL LOW (ref 6.0–8.5)

## 2017-06-05 LAB — MICROALBUMIN / CREATININE URINE RATIO
Creatinine, Urine: 80.7 mg/dL
Microalb/Creat Ratio: 5120.1 mg/g creat — ABNORMAL HIGH (ref 0.0–30.0)
Microalbumin, Urine: 4131.9 ug/mL

## 2017-06-15 ENCOUNTER — Telehealth: Payer: Self-pay

## 2017-06-15 NOTE — Telephone Encounter (Signed)
Message received from Dr Wynetta Emery inquiring if the patient could benefit from a referral to [place] to assist her with medicaid application. Call placed to the patient  [phone] (H) to inquire if she has applied for medicaid and if so, what is the status.  Voicemail message left requesting a call back to this CM at # [phone].

## 2017-06-16 ENCOUNTER — Telehealth: Payer: Self-pay

## 2017-06-16 NOTE — Telephone Encounter (Signed)
Call received from the patient.  She said that she went to DSS and was told that if she was not disabled or a certain age, she would not qualify. She thinks she applied " a while ago" and was denied but is not sure. She currently has an [employer] and [employer] and said that she is planning to go to [facility] to apply for their financial assistance so that she can see nephrologist there. She noted that she does not have the money to afford to see a nephrologist in [place].  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 She noted that her job at [employer] is only part time and she does not qualify for insurance.  This CM explained that legal aid of [place] may be able to assist her with medicaid but she was not interested at this time and said she is doing what she can and will work with [facility] first.  She said that she usually walks to work and also uses the city bus, explaining that she is able to ride for 0.75 each way as they give her " disabled" fare. Explained to her that with the [facility], she is eligible for bus passes to medical appointments, she just needs to call social services to order. We also discussed [employer] and she stated she would think about it. She understands that she can come to [facility] at any time to complete an application.  Encouraged her to call this CM if she has any further questions.

## 2017-07-13 MED FILL — $novoLIN 70/30 100 UNITS/ML: (70-30) 100 | 25 days supply | Qty: 30 | Fill #3

## 2017-07-13 MED FILL — ?LISINOPRIL-HCTZ 20/12.5 TA: 20-12.5 | 30 days supply | Qty: 60 | Fill #4

## 2017-07-13 MED FILL — $VICTOZA 2-PAK 18MG/3ML PEN: 18 | 28 days supply | Qty: 9 | Fill #6

## 2017-07-13 MED FILL — ?ATORVASTATIN 40MG TABLET: 40 | 30 days supply | Qty: 30 | Fill #6

## 2017-07-13 MED FILL — AMLODIPINE BESYLATE 10 MG T: 10 | 30 days supply | Qty: 30 | Fill #6

## 2017-07-17 MED FILL — ?CARVEDILOL 25 MG TABLET: 25 | 30 days supply | Qty: 60 | Fill #0

## 2017-08-24 MED FILL — TRUE METRIX TEST STRIP: 30 days supply | Qty: 100 | Fill #1

## 2017-08-24 MED FILL — $novoLIN 70/30 100 UNITS/ML: (70-30) 100 | 25 days supply | Qty: 30 | Fill #4

## 2017-08-24 MED FILL — ?ATORVASTATIN 40MG TABLET: 40 | 30 days supply | Qty: 30 | Fill #7

## 2017-08-24 MED FILL — AMLODIPINE BESYLATE 10 MG T: 10 | 30 days supply | Qty: 30 | Fill #7

## 2017-08-24 MED FILL — ?CARVEDILOL 25 MG TABLET: 25 | 30 days supply | Qty: 60 | Fill #1

## 2017-08-24 MED FILL — $VICTOZA 2-PAK 18MG/3ML PEN: 18 | 28 days supply | Qty: 9 | Fill #7

## 2017-08-26 MED FILL — TRUEPLUS PEN NDL 31GX3/16: 31G X 5 MM | 30 days supply | Qty: 100 | Fill #0

## 2017-08-26 MED FILL — TRUEPLUS PEN NDL 31GX3/16": 31G X 5 MM | 30 days supply | Qty: 100 | Fill #0

## 2017-09-17 MED FILL — $novoLIN 70/30 100 UNITS/ML: (70-30) 100 | 25 days supply | Qty: 30 | Fill #5

## 2017-09-17 MED FILL — AMLODIPINE BESYLATE 10 MG T: 10 | 30 days supply | Qty: 30 | Fill #8

## 2017-09-17 MED FILL — LISINOPRIL-HCTZ 20-12.5 MG: 20-12.5 | 30 days supply | Qty: 60 | Fill #5

## 2017-09-17 MED FILL — ?ATORVASTATIN 40MG TABLET: 40 | 30 days supply | Qty: 30 | Fill #8

## 2017-09-17 MED FILL — $VICTOZA 2-PAK 18MG/3ML PEN: 18 | 28 days supply | Qty: 9 | Fill #8

## 2017-09-28 ENCOUNTER — Observation Stay (HOSPITAL_COMMUNITY)
Admission: EM | Admit: 2017-09-28 | Discharge: 2017-09-29 | Disposition: A | Discharge status: Home / Self Care | Payer: Self-pay | Attending: Internal Medicine | Admitting: Internal Medicine

## 2017-09-28 ENCOUNTER — Other Ambulatory Visit: Payer: Self-pay

## 2017-09-28 ENCOUNTER — Encounter (HOSPITAL_COMMUNITY): Payer: Self-pay | Admitting: Emergency Medicine

## 2017-09-28 DIAGNOSIS — I129 Hypertensive chronic kidney disease with stage 1 through stage 4 chronic kidney disease, or unspecified chronic kidney disease: Secondary | ICD-10-CM | POA: Insufficient documentation

## 2017-09-28 DIAGNOSIS — K219 Gastro-esophageal reflux disease without esophagitis: Secondary | ICD-10-CM | POA: Insufficient documentation

## 2017-09-28 DIAGNOSIS — E860 Dehydration: Secondary | ICD-10-CM | POA: Insufficient documentation

## 2017-09-28 DIAGNOSIS — Z823 Family history of stroke: Secondary | ICD-10-CM | POA: Insufficient documentation

## 2017-09-28 DIAGNOSIS — Z881 Allergy status to other antibiotic agents status: Secondary | ICD-10-CM | POA: Insufficient documentation

## 2017-09-28 DIAGNOSIS — IMO0002 Reserved for concepts with insufficient information to code with codable children: Secondary | ICD-10-CM | POA: Diagnosis present

## 2017-09-28 DIAGNOSIS — E785 Hyperlipidemia, unspecified: Secondary | ICD-10-CM | POA: Insufficient documentation

## 2017-09-28 DIAGNOSIS — N179 Acute kidney failure, unspecified: Secondary | ICD-10-CM | POA: Diagnosis present

## 2017-09-28 DIAGNOSIS — H409 Unspecified glaucoma: Secondary | ICD-10-CM | POA: Insufficient documentation

## 2017-09-28 DIAGNOSIS — E1121 Type 2 diabetes mellitus with diabetic nephropathy: Secondary | ICD-10-CM | POA: Insufficient documentation

## 2017-09-28 DIAGNOSIS — G4700 Insomnia, unspecified: Secondary | ICD-10-CM | POA: Insufficient documentation

## 2017-09-28 DIAGNOSIS — M25541 Pain in joints of right hand: Secondary | ICD-10-CM | POA: Insufficient documentation

## 2017-09-28 DIAGNOSIS — E1165 Type 2 diabetes mellitus with hyperglycemia: Secondary | ICD-10-CM

## 2017-09-28 DIAGNOSIS — R269 Unspecified abnormalities of gait and mobility: Secondary | ICD-10-CM | POA: Insufficient documentation

## 2017-09-28 DIAGNOSIS — M25542 Pain in joints of left hand: Secondary | ICD-10-CM | POA: Insufficient documentation

## 2017-09-28 DIAGNOSIS — Z833 Family history of diabetes mellitus: Secondary | ICD-10-CM | POA: Insufficient documentation

## 2017-09-28 DIAGNOSIS — E1139 Type 2 diabetes mellitus with other diabetic ophthalmic complication: Secondary | ICD-10-CM | POA: Diagnosis present

## 2017-09-28 DIAGNOSIS — J309 Allergic rhinitis, unspecified: Secondary | ICD-10-CM | POA: Insufficient documentation

## 2017-09-28 DIAGNOSIS — N184 Chronic kidney disease, stage 4 (severe): Secondary | ICD-10-CM | POA: Diagnosis present

## 2017-09-28 DIAGNOSIS — E1122 Type 2 diabetes mellitus with diabetic chronic kidney disease: Secondary | ICD-10-CM | POA: Insufficient documentation

## 2017-09-28 DIAGNOSIS — R112 Nausea with vomiting, unspecified: Principal | ICD-10-CM

## 2017-09-28 DIAGNOSIS — I10 Essential (primary) hypertension: Secondary | ICD-10-CM | POA: Diagnosis present

## 2017-09-28 DIAGNOSIS — Z23 Encounter for immunization: Secondary | ICD-10-CM | POA: Insufficient documentation

## 2017-09-28 DIAGNOSIS — F332 Major depressive disorder, recurrent severe without psychotic features: Secondary | ICD-10-CM | POA: Insufficient documentation

## 2017-09-28 DIAGNOSIS — Z79899 Other long term (current) drug therapy: Secondary | ICD-10-CM | POA: Insufficient documentation

## 2017-09-28 DIAGNOSIS — D631 Anemia in chronic kidney disease: Secondary | ICD-10-CM | POA: Insufficient documentation

## 2017-09-28 DIAGNOSIS — Z811 Family history of alcohol abuse and dependence: Secondary | ICD-10-CM | POA: Insufficient documentation

## 2017-09-28 DIAGNOSIS — Z90710 Acquired absence of both cervix and uterus: Secondary | ICD-10-CM | POA: Insufficient documentation

## 2017-09-28 DIAGNOSIS — Z794 Long term (current) use of insulin: Secondary | ICD-10-CM | POA: Insufficient documentation

## 2017-09-28 DIAGNOSIS — Z8249 Family history of ischemic heart disease and other diseases of the circulatory system: Secondary | ICD-10-CM | POA: Insufficient documentation

## 2017-09-28 DIAGNOSIS — E11319 Type 2 diabetes mellitus with unspecified diabetic retinopathy without macular edema: Secondary | ICD-10-CM | POA: Insufficient documentation

## 2017-09-28 HISTORY — DX: Nausea with vomiting, unspecified: R11.2

## 2017-09-28 HISTORY — DX: Unspecified background retinopathy: H35.00

## 2017-09-28 HISTORY — DX: Unspecified cataract: H26.9

## 2017-09-28 HISTORY — DX: Unspecified glaucoma: H40.9

## 2017-09-28 LAB — COMPREHENSIVE METABOLIC PANEL
ALT: 47 U/L — ABNORMAL HIGH (ref 0–44)
ANION GAP: 16 — AB (ref 5–15)
AST: 29 U/L (ref 15–41)
Albumin: 4 g/dL (ref 3.5–5.0)
Alkaline Phosphatase: 150 U/L — ABNORMAL HIGH (ref 38–126)
BILIRUBIN TOTAL: 0.6 mg/dL (ref 0.3–1.2)
BUN: 57 mg/dL — ABNORMAL HIGH (ref 6–20)
CO2: 23 mmol/L (ref 22–32)
Calcium: 9.6 mg/dL (ref 8.9–10.3)
Chloride: 104 mmol/L (ref 98–111)
Creatinine, Ser: 3.52 mg/dL — ABNORMAL HIGH (ref 0.44–1.00)
GFR calc Af Amer: 16 mL/min — ABNORMAL LOW (ref >60)
GFR, EST NON AFRICAN AMERICAN: 14 mL/min — AB (ref >60)
Glucose, Bld: 76 mg/dL (ref 70–99)
POTASSIUM: 4.3 mmol/L (ref 3.5–5.1)
Sodium: 143 mmol/L (ref 135–145)
TOTAL PROTEIN: 6.8 g/dL (ref 6.5–8.1)

## 2017-09-28 LAB — CBC
HEMATOCRIT: 41.6 % (ref 36.0–46.0)
HEMOGLOBIN: 13.8 g/dL (ref 12.0–15.0)
MCH: 27.5 pg (ref 26.0–34.0)
MCHC: 33.2 g/dL (ref 30.0–36.0)
MCV: 83 fL (ref 78.0–100.0)
Platelets: 395 10*3/uL (ref 150–400)
RBC: 5.01 MIL/uL (ref 3.87–5.11)
RDW: 12.8 % (ref 11.5–15.5)
WBC: 9 10*3/uL (ref 4.0–10.5)

## 2017-09-28 LAB — URINALYSIS, ROUTINE W REFLEX MICROSCOPIC
BACTERIA UA: NONE SEEN
Bilirubin Urine: NEGATIVE
GLUCOSE, UA: NEGATIVE mg/dL
HGB URINE DIPSTICK: NEGATIVE
Ketones, ur: NEGATIVE mg/dL
LEUKOCYTES UA: NEGATIVE
NITRITE: NEGATIVE
Specific Gravity, Urine: 1.014 (ref 1.005–1.030)
pH: 7 (ref 5.0–8.0)

## 2017-09-28 LAB — GLUCOSE, CAPILLARY
Glucose-Capillary: 168 mg/dL — ABNORMAL HIGH (ref 70–99)
Glucose-Capillary: 48 mg/dL — ABNORMAL LOW (ref 70–99)
Glucose-Capillary: 97 mg/dL (ref 70–99)

## 2017-09-28 LAB — LIPASE, BLOOD: Lipase: 46 U/L (ref 11–51)

## 2017-09-28 LAB — CBG MONITORING, ED: Glucose-Capillary: 63 mg/dL — ABNORMAL LOW (ref 70–99)

## 2017-09-28 MED ORDER — AMLODIPINE BESYLATE 10 MG PO TABS
10.0000 mg | ORAL_TABLET | Freq: Every day | ORAL | Status: DC
  Administered 2017-09-29: 10 mg via ORAL
  Filled 2017-09-28: qty 1

## 2017-09-28 MED ORDER — INSULIN ASPART 100 UNIT/ML ~~LOC~~ SOLN: 0.0000 [IU] | Freq: Three times a day (TID) | SUBCUTANEOUS | Status: DC

## 2017-09-28 MED ORDER — ZOLPIDEM TARTRATE 5 MG PO TABS: 5.0000 mg | ORAL_TABLET | Freq: Every evening | ORAL | Status: DC | PRN

## 2017-09-28 MED ORDER — CYCLOBENZAPRINE HCL 10 MG PO TABS: 10.0000 mg | ORAL_TABLET | Freq: Every day | ORAL | Status: DC | PRN

## 2017-09-28 MED ORDER — INSULIN ASPART PROT & ASPART (70-30 MIX) 100 UNIT/ML ~~LOC~~ SUSP
40.0000 [IU] | Freq: Two times a day (BID) | SUBCUTANEOUS | Status: DC
  Administered 2017-09-29: 40 [IU] via SUBCUTANEOUS
  Filled 2017-09-28: qty 10

## 2017-09-28 MED ORDER — SODIUM CHLORIDE 0.9 % IV SOLN
1000.0000 mL | INTRAVENOUS | Status: DC
  Administered 2017-09-28: 1000 mL via INTRAVENOUS

## 2017-09-28 MED ORDER — ONDANSETRON HCL 4 MG PO TABS: 4.0000 mg | ORAL_TABLET | Freq: Four times a day (QID) | ORAL | Status: DC | PRN

## 2017-09-28 MED ORDER — ACETAMINOPHEN 325 MG PO TABS: 650.0000 mg | ORAL_TABLET | Freq: Four times a day (QID) | ORAL | Status: DC | PRN

## 2017-09-28 MED ORDER — ACETAMINOPHEN 650 MG RE SUPP: 650.0000 mg | Freq: Four times a day (QID) | RECTAL | Status: DC | PRN

## 2017-09-28 MED ORDER — HEPARIN SODIUM (PORCINE) 5000 UNIT/ML IJ SOLN
5000.0000 [IU] | Freq: Three times a day (TID) | INTRAMUSCULAR | Status: DC
  Administered 2017-09-29 (×2): 5000 [IU] via SUBCUTANEOUS
  Filled 2017-09-28 (×2): qty 1

## 2017-09-28 MED ORDER — ONDANSETRON HCL 4 MG/2ML IJ SOLN: 4.0000 mg | Freq: Four times a day (QID) | INTRAMUSCULAR | Status: DC | PRN

## 2017-09-28 MED ORDER — CARVEDILOL 25 MG PO TABS
25.0000 mg | ORAL_TABLET | Freq: Two times a day (BID) | ORAL | Status: DC
  Administered 2017-09-29 (×2): 25 mg via ORAL
  Filled 2017-09-28 (×2): qty 1

## 2017-09-28 MED ORDER — TIMOLOL HEMIHYDRATE 0.5 % OP SOLN: 1.0000 [drp] | Freq: Every day | OPHTHALMIC | Status: DC

## 2017-09-28 MED ORDER — LORATADINE 10 MG PO TABS
10.0000 mg | ORAL_TABLET | Freq: Every day | ORAL | Status: DC
  Administered 2017-09-29: 10 mg via ORAL
  Filled 2017-09-28: qty 1

## 2017-09-28 MED ORDER — ONDANSETRON HCL 4 MG/2ML IJ SOLN
4.0000 mg | Freq: Once | INTRAMUSCULAR | Status: AC
  Administered 2017-09-28: 4 mg via INTRAVENOUS
  Filled 2017-09-28: qty 2

## 2017-09-28 MED ORDER — SODIUM CHLORIDE 0.9 % IV BOLUS (SEPSIS)
1000.0000 mL | Freq: Once | INTRAVENOUS | Status: AC
  Administered 2017-09-28: 1000 mL via INTRAVENOUS

## 2017-09-28 MED ORDER — ATORVASTATIN CALCIUM 40 MG PO TABS
40.0000 mg | ORAL_TABLET | Freq: Every day | ORAL | Status: DC
  Administered 2017-09-29: 40 mg via ORAL
  Filled 2017-09-28: qty 1

## 2017-09-28 NOTE — ED Provider Notes (Signed)
[facility] EMERGENCY DEPARTMENT Provider Note   CSN: [id number] Arrival date & time: 09/28/17  1603     History   Chief Complaint Chief Complaint  Patient presents with  . Emesis    HPI Ann Bryan is a 57 y.o. female.  HPI Pt started having nausea and vomiting this weekend.  She feels worse in the morning but then it got better.  It seems to be getting more over the last couple of weeks.  Today she could not keep anything down.  She vomited multiple times and could not keep her medications down.  Patient continued to vomit throughout the day.  She started to feel weak.  She denies any diarrhea or constipation.  No abdominal pain.  No fevers or chills Past Medical History:  Diagnosis Date  . Depression   . Diabetes mellitus 1986    poorly controlled last hemoglobin A1c  10.5 on January 01, 2010, increase since June 2011 when hemoglobin A1c was 9.3 , determined to be most likely secondary to non-compliance.  . Diabetic nephropathy ([place])   . Diabetic retinopathy   . Hyperlipidemia   . Hypertension     Patient Active Problem List   Diagnosis Date Noted  . Diabetic nephropathy associated with type 2 diabetes mellitus ([place]) 06/05/2017  . Insomnia 06/05/2017  . CKD (chronic kidney disease) stage 4, GFR 15-29 ml/min (HCC) 06/05/2017  . Chronic seasonal allergic rhinitis 12/10/2015  . Arthralgia of both hands 12/10/2015  . Severe episode of recurrent major depressive disorder, without psychotic features ([place]) 12/10/2015  . Abnormality of gait 04/13/2012  . NEPHROPATHY, DIABETIC 09/23/2006  . Hyperlipidemia associated with type 2 diabetes mellitus ([place]) 12/23/2005  . Early onset dysthymia 12/23/2005  . Essential hypertension 12/23/2005  . 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 ABDOMINAL HYSTERECTOMY   2004     OB History   None      Home  Medications    Prior to Admission medications   Medication Sig Start Date End Date Taking? Authorizing Provider  amLODipine (NORVASC) 10 MG tablet Take 1 tablet (10 mg total) by mouth daily. 10/09/16   Ladell Pier, MD  atorvastatin (LIPITOR) 40 MG tablet Take 1 tablet (40 mg total) by mouth daily. 10/09/16   Ladell Pier, MD  Blood Glucose Monitoring Suppl (TRUE METRIX METER) w/Device KIT 1 each by Does not apply route as needed. 10/09/16   Ladell Pier, MD  carvedilol (COREG) 25 MG tablet TAKE 1 TABLET BY MOUTH 2 TIMES DAILY WITH A MEAL. 06/04/17   Ladell Pier, MD  cetirizine (ZYRTEC) 10 MG tablet Take 1 tablet (10 mg total) by mouth daily. 02/17/17   Ladell Pier, MD  cyclobenzaprine (FLEXERIL) 10 MG tablet Take 1 tablet (10 mg total) by mouth daily as needed for muscle spasms. 10/09/16   Ladell Pier, MD  glucose blood (TRUE METRIX BLOOD GLUCOSE TEST) test strip 1 each by Other route 3 (three) times daily. 10/20/14   Funches, Adriana Mccallum, MD  glucose blood (TRUE METRIX BLOOD GLUCOSE TEST) test strip Use as instructed 11/06/16   Ladell Pier, MD  insulin NPH-regular Human (NOVOLIN 70/30) (70-30) 100 UNIT/ML injection Inject 60 Units into the skin 2 (two) times daily with a meal. 01/22/17   Ladell Pier, MD  Insulin Pen Needle (PEN NEEDLES) 31G X 6 MM MISC Use as directed 01/22/17  Ladell Pier, MD  Insulin Syringe-Needle U-100 (INSULIN SYRINGE 1CC/31GX5/16") 31G X 5/16" 1 ML MISC Use as directed.  BD 27m x 31G needles 01/22/17   JLadell Pier MD  Insulin Syringes, Disposable, U-100 0.5 ML MISC 1 each by Does not apply route 2 (two) times daily. 01/30/14   Funches, JAdriana Mccallum MD  liraglutide (VICTOZA) 18 MG/3ML SOPN Inject 0.3 mLs (1.8 mg total) into the skin daily. 10/09/16   JLadell Pier MD  lisinopril-hydrochlorothiazide (ZESTORETIC) 20-12.5 MG tablet Take 2 tablets by mouth daily. 10/09/16   JLadell Pier MD  timolol (BETIMOL) 0.5 %  ophthalmic solution Place 1 drop into both eyes daily. Patient not taking: Reported on 12/10/2015 11/23/12   CHoyt Koch MD  TRUEPLUS LANCETS 28G MISC 1 each by Does not apply route 3 (three) times daily. 01/30/14   Funches, JAdriana Mccallum MD  zolpidem (AMBIEN) 5 MG tablet Take 1 tablet (5 mg total) by mouth at bedtime as needed. Each prescription to last a month. 06/04/17   JLadell Pier MD    Family History Family History  Problem Relation Age of Onset  . Diabetes Mother   . Stroke Mother   . Hypertension Mother   . Alcohol abuse Mother   . Depression Father        Committed suicide  . Alcohol abuse Maternal Uncle     Social History Social History   Tobacco Use  . Smoking status: Never Smoker  . Smokeless tobacco: Never Used  Substance Use Topics  . Alcohol use: No  . Drug use: No     Allergies   Doxycycline   Review of Systems Review of Systems  All other systems reviewed and are negative.    Physical Exam Updated Vital Signs BP (!) 165/80 (BP Location: Left Arm)   Pulse (!) 101   Temp 98.1 F (36.7 C) (Oral)   Resp 16   LMP 03/13/2002   SpO2 99%   Physical Exam  Constitutional: She appears well-developed and well-nourished. No distress.  HENT:  Head: Normocephalic and atraumatic.  Right Ear: External ear normal.  Left Ear: External ear normal.  Eyes: Conjunctivae are normal. Right eye exhibits no discharge. Left eye exhibits no discharge. No scleral icterus.  Neck: Neck supple. No tracheal deviation present.  Cardiovascular: Normal rate, regular rhythm and intact distal pulses.  Pulmonary/Chest: Effort normal and breath sounds normal. No stridor. No respiratory distress. She has no wheezes. She has no rales.  Abdominal: Soft. Bowel sounds are normal. She exhibits no distension. There is no tenderness. There is no rebound and no guarding.  Musculoskeletal: She exhibits no edema or tenderness.  Neurological: She is alert. She has normal  strength. No cranial nerve deficit (no facial droop, extraocular movements intact, no slurred speech) or sensory deficit. She exhibits normal muscle tone. She displays no seizure activity. Coordination normal.  Skin: Skin is warm and dry. No rash noted. She is not diaphoretic.  Psychiatric: She has a normal mood and affect.  Nursing note and vitals reviewed.    ED Treatments / Results  Labs (all labs ordered are listed, but only abnormal results are displayed) Labs Reviewed  COMPREHENSIVE METABOLIC PANEL - Abnormal; Notable for the following components:      Result Value   BUN 57 (*)    Creatinine, Ser 3.52 (*)    ALT 47 (*)    Alkaline Phosphatase 150 (*)    GFR calc non Af Amer 14 (*)  GFR calc Af Amer 16 (*)    Anion gap 16 (*)    All other components within normal limits  URINALYSIS, ROUTINE W REFLEX MICROSCOPIC - Abnormal; Notable for the following components:   Protein, ur >=300 (*)    All other components within normal limits  CBG MONITORING, ED - Abnormal; Notable for the following components:   Glucose-Capillary 63 (*)    All other components within normal limits  LIPASE, BLOOD  CBC     Procedures Procedures (including critical care time)  Medications Ordered in ED Medications  sodium chloride 0.9 % bolus 1,000 mL (has no administration in time range)    Followed by  0.9 %  sodium chloride infusion (has no administration in time range)  ondansetron (ZOFRAN) injection 4 mg (has no administration in time range)     Initial Impression / Assessment and Plan / ED Course  I have reviewed the triage vital signs and the nursing notes.  Pertinent labs & imaging results that were available during my care of the patient were reviewed by me and considered in my medical decision making (see chart for details).  Clinical Course as of Sep 29 2047  Mon Sep 28, 2017  2036 Chloride: 104 [JK]  2049 BUN and creatinine increased from previous  Comprehensive metabolic  panel(!) [JK]    Clinical Course User Index [JK] Dorie Rank, MD    Patient presented to the emergency room with complaints of nausea and vomiting.  She denies any abdominal pain.  No signs of DKA.  patient has a history of chronic kidney disease.  She does have an increase in her BUN and creatinine compared to previous values.  I suspect this is a component of dehydration on top of her chronic kidney disease.  Plan on IV fluids and antinausea medications.  I will consult the medical service for admission overnight observation considering her acute kidney injury.  Final Clinical Impressions(s) / ED Diagnoses   Final diagnoses:  Nausea and vomiting, intractability of vomiting not specified, unspecified vomiting type  AKI (acute kidney injury) ([place])      Dorie Rank, MD 09/28/17 2049

## 2017-09-28 NOTE — H&P (Signed)
 History and Physical    Ann Bryan MRN:[id number] DOB: 10/21/1960 DOA: 09/28/2017  PCP: Johnson, Deborah B, MD  Patient coming from: Home.  Chief Complaint: Nausea vomiting.  HPI: Ann Bryan is a 56 y.o. female with a of diabetes mellitus type 2, hypertension, hyperlipidemia, chronic kidney disease stage IV presents to the ER with complaints of nausea vomiting for the last 3 days.  Patient states her nausea vomiting started 3 days ago which is multiple episodes.  Denies any diarrhea or abdominal pain.  Denies any recent travel or sick contacts.  Denies any recent use of antibiotics.  Following the first day her nausea vomiting improved but started recurring today again.  ED Course: In the ER on exam patient abdomen appears benign.  Still had some vomiting and labs revealed worsening of renal function by 1 of her baseline.  Patient was given fluid bolus and admitted for further observation.  Review of Systems: As per HPI, rest all negative.   Past Medical History:  Diagnosis Date  . Cataract    right eye  . Depression   . Diabetes mellitus 1986    poorly controlled last hemoglobin A1c  10.5 on January 01, 2010, increase since June 2011 when hemoglobin A1c was 9.3 , determined to be most likely secondary to non-compliance.  . Diabetic nephropathy (HCC)   . Diabetic retinopathy   . Glaucoma   . Hyperlipidemia   . Hypertension   . Retinopathy     Past Surgical History:  Procedure Laterality Date  . ABDOMINAL HYSTERECTOMY   2004     reports that she has never smoked. She has never used smokeless tobacco. She reports that she does not drink alcohol or use drugs.  Allergies  Allergen Reactions  . Doxycycline     REACTION: N/V    Family History  Problem Relation Age of Onset  . Diabetes Mother   . Stroke Mother   . Hypertension Mother   . Alcohol abuse Mother   . Depression Father        Committed suicide  . Alcohol abuse Maternal Uncle     Prior to Admission  medications   Medication Sig Start Date End Date Taking? Authorizing Provider  amLODipine (NORVASC) 10 MG tablet Take 1 tablet (10 mg total) by mouth daily. 10/09/16   Johnson, Deborah B, MD  atorvastatin (LIPITOR) 40 MG tablet Take 1 tablet (40 mg total) by mouth daily. 10/09/16   Johnson, Deborah B, MD  Blood Glucose Monitoring Suppl (TRUE METRIX METER) w/Device KIT 1 each by Does not apply route as needed. 10/09/16   Johnson, Deborah B, MD  carvedilol (COREG) 25 MG tablet TAKE 1 TABLET BY MOUTH 2 TIMES DAILY WITH A MEAL. 06/04/17   Johnson, Deborah B, MD  cetirizine (ZYRTEC) 10 MG tablet Take 1 tablet (10 mg total) by mouth daily. 02/17/17   Johnson, Deborah B, MD  cyclobenzaprine (FLEXERIL) 10 MG tablet Take 1 tablet (10 mg total) by mouth daily as needed for muscle spasms. 10/09/16   Johnson, Deborah B, MD  glucose blood (TRUE METRIX BLOOD GLUCOSE TEST) test strip 1 each by Other route 3 (three) times daily. 10/20/14   Funches, Josalyn, MD  glucose blood (TRUE METRIX BLOOD GLUCOSE TEST) test strip Use as instructed 11/06/16   Johnson, Deborah B, MD  insulin NPH-regular Human (NOVOLIN 70/30) (70-30) 100 UNIT/ML injection Inject 60 Units into the skin 2 (two) times daily with a meal. 01/22/17   Johnson, Deborah B, MD    Insulin Pen Needle (PEN NEEDLES) 31G X 6 MM MISC Use as directed 01/22/17   Johnson, Deborah B, MD  Insulin Syringe-Needle U-100 (INSULIN SYRINGE 1CC/31GX5/16") 31G X 5/16" 1 ML MISC Use as directed.  BD 6mm x 31G needles 01/22/17   Johnson, Deborah B, MD  Insulin Syringes, Disposable, U-100 0.5 ML MISC 1 each by Does not apply route 2 (two) times daily. 01/30/14   Funches, Josalyn, MD  liraglutide (VICTOZA) 18 MG/3ML SOPN Inject 0.3 mLs (1.8 mg total) into the skin daily. 10/09/16   Johnson, Deborah B, MD  lisinopril-hydrochlorothiazide (ZESTORETIC) 20-12.5 MG tablet Take 2 tablets by mouth daily. 10/09/16   Johnson, Deborah B, MD  timolol (BETIMOL) 0.5 % ophthalmic solution Place 1 drop into  both eyes daily. 11/23/12   Crawford, Elizabeth A, MD  TRUEPLUS LANCETS 28G MISC 1 each by Does not apply route 3 (three) times daily. 01/30/14   Funches, Josalyn, MD  zolpidem (AMBIEN) 5 MG tablet Take 1 tablet (5 mg total) by mouth at bedtime as needed. Each prescription to last a month. 06/04/17   Johnson, Deborah B, MD    Physical Exam: Vitals:   09/28/17 1754 09/28/17 1840 09/28/17 2010 09/28/17 2139  BP: (!) 179/83 (!) 151/68 (!) 165/80 (!) 167/81  Pulse: (!) 104 99 (!) 101 (!) 101  Resp: 18 16 16 16  Temp: 98 F (36.7 C) 98.8 F (37.1 C) 98.1 F (36.7 C) 97.7 F (36.5 C)  TempSrc: Oral Oral Oral Oral  SpO2: 98% 99% 99% 98%  Weight:    77.1 kg  Height:    5' (1.524 m)      Constitutional: Moderately built and nourished. Vitals:   09/28/17 1754 09/28/17 1840 09/28/17 2010 09/28/17 2139  BP: (!) 179/83 (!) 151/68 (!) 165/80 (!) 167/81  Pulse: (!) 104 99 (!) 101 (!) 101  Resp: 18 16 16 16  Temp: 98 F (36.7 C) 98.8 F (37.1 C) 98.1 F (36.7 C) 97.7 F (36.5 C)  TempSrc: Oral Oral Oral Oral  SpO2: 98% 99% 99% 98%  Weight:    77.1 kg  Height:    5' (1.524 m)   Eyes: Anicteric no pallor. ENMT: No discharge from the ears eyes nose or mouth. Neck: No mass palpated no neck rigidity but no JVD appreciated. Respiratory: No rhonchi or crepitations. Cardiovascular: S1-S2 heard no murmurs appreciated. Abdomen: Soft nontender bowel sounds present. Musculoskeletal: No edema.  No joint effusion. Skin: No rash.  Skin appears warm. Neurologic: Alert awake oriented to time place and person.  Moves all extremities. Psychiatric: Appears normal per normal affect.   Labs on Admission: I have personally reviewed following labs and imaging studies  CBC: Recent Labs  Lab 09/28/17 1613  WBC 9.0  HGB 13.8  HCT 41.6  MCV 83.0  PLT 395   Basic Metabolic Panel: Recent Labs  Lab 09/28/17 1613  NA 143  K 4.3  CL 104  CO2 23  GLUCOSE 76  BUN 57*  CREATININE 3.52*  CALCIUM  9.6   GFR: Estimated Creatinine Clearance: 16.6 mL/min (A) (by C-G formula based on SCr of 3.52 mg/dL (H)). Liver Function Tests: Recent Labs  Lab 09/28/17 1613  AST 29  ALT 47*  ALKPHOS 150*  BILITOT 0.6  PROT 6.8  ALBUMIN 4.0   Recent Labs  Lab 09/28/17 1613  LIPASE 46   No results for input(s): AMMONIA in the last 168 hours. Coagulation Profile: No results for input(s): INR, PROTIME in the last 168 hours.   Cardiac Enzymes: No results for input(s): CKTOTAL, CKMB, CKMBINDEX, TROPONINI in the last 168 hours. BNP (last 3 results) No results for input(s): PROBNP in the last 8760 hours. HbA1C: No results for input(s): HGBA1C in the last 72 hours. CBG: Recent Labs  Lab 09/28/17 1947 09/28/17 2205 09/28/17 2240  GLUCAP 63* 48* 97   Lipid Profile: No results for input(s): CHOL, HDL, LDLCALC, TRIG, CHOLHDL, LDLDIRECT in the last 72 hours. Thyroid Function Tests: No results for input(s): TSH, T4TOTAL, FREET4, T3FREE, THYROIDAB in the last 72 hours. Anemia Panel: No results for input(s): VITAMINB12, FOLATE, FERRITIN, TIBC, IRON, RETICCTPCT in the last 72 hours. Urine analysis:    Component Value Date/Time   COLORURINE YELLOW 09/28/2017 1611   APPEARANCEUR CLEAR 09/28/2017 1611   LABSPEC 1.014 09/28/2017 1611   PHURINE 7.0 09/28/2017 1611   GLUCOSEU NEGATIVE 09/28/2017 1611   GLUCOSEU 500 (A) 06/10/2006 2036   HGBUR NEGATIVE 09/28/2017 1611   HGBUR trace-intact 06/10/2006 0902   BILIRUBINUR NEGATIVE 09/28/2017 1611   BILIRUBINUR small 01/30/2014 1133   KETONESUR NEGATIVE 09/28/2017 1611   PROTEINUR >=300 (A) 09/28/2017 1611   UROBILINOGEN 1.0 01/30/2014 1133   UROBILINOGEN 1.0 10/08/2010 1554   NITRITE NEGATIVE 09/28/2017 1611   LEUKOCYTESUR NEGATIVE 09/28/2017 1611   Sepsis Labs: _0 (procalcitonin:4,lacticidven:4) )No results found for this or any previous visit (from the past 240 hour(s)).   Radiological Exams on Admission: No results  found.    Assessment/Plan Principal Problem:   ARF (acute renal failure) (HCC) Active Problems:   DM (diabetes mellitus) type II uncontrolled with eye manifestation (HCC)   Essential hypertension   CKD (chronic kidney disease) stage 4, GFR 15-29 ml/min (HCC)   Nausea and vomiting    1. Intractable nausea vomiting -cause not clear could be either gastroparesis or viral gastroenteritis.  Will check urine drug screen.  Abdomen appears benign.  Nontender on exam.  I will try diet now and advance if tolerated.  If patient is not tolerating the consider further imaging. 2. Hypertension -since patient has acute on chronic kidney disease due to dehydration we will hold off lisinopril and hydrochlorothiazide.  Will continue patient's Coreg and amlodipine.  PRN IV hydralazine. 3. Acute on chronic kidney disease stage IV likely worsening due to vomiting.  Gently hydrate hold lisinopril hydrochlorothiazide follow metabolic panel. 4. Diabetes mellitus type 2 -on NovoLog 70/30.  He usually takes 60 units twice daily due to worsening renal function I have decreased dose to 40 units twice daily.  Follow CBGs.  I have placed patient on sliding scale coverage. 5. History of anemia follow CBC.   DVT prophylaxis: Heparin. Code Status: Full code. Family Communication: Discussed with patient. Disposition Plan: Home. Consults called: None. Admission status: Observation.   Rise Patience MD [facility] Pager [phone].  If 7PM-7AM, please contact night-coverage [email or web address] Password [facility]  09/28/2017, 11:28 PM

## 2017-09-28 NOTE — ED Triage Notes (Addendum)
Per EMS- pt is here for emesis since Saturday. HTN and tachy. No abdominal pain. Pt is a diabetic, took her insulin, did not eat, but ate some sugar packets. No chest pain no dizziness. Pt states she has stage 4 kidney disease and is having a problem getting nephrologist appointment bc she has no insurance. Hypertensive, threw up her BP meds.

## 2017-09-29 ENCOUNTER — Encounter (HOSPITAL_COMMUNITY): Payer: Self-pay | Admitting: Internal Medicine

## 2017-09-29 DIAGNOSIS — E1139 Type 2 diabetes mellitus with other diabetic ophthalmic complication: Secondary | ICD-10-CM

## 2017-09-29 DIAGNOSIS — N170 Acute kidney failure with tubular necrosis: Secondary | ICD-10-CM

## 2017-09-29 DIAGNOSIS — E1165 Type 2 diabetes mellitus with hyperglycemia: Secondary | ICD-10-CM

## 2017-09-29 DIAGNOSIS — R1114 Bilious vomiting: Secondary | ICD-10-CM

## 2017-09-29 DIAGNOSIS — N184 Chronic kidney disease, stage 4 (severe): Secondary | ICD-10-CM

## 2017-09-29 LAB — BASIC METABOLIC PANEL
ANION GAP: 8 (ref 5–15)
BUN: 49 mg/dL — AB (ref 6–20)
CHLORIDE: 114 mmol/L — AB (ref 98–111)
CO2: 21 mmol/L — ABNORMAL LOW (ref 22–32)
Calcium: 7.7 mg/dL — ABNORMAL LOW (ref 8.9–10.3)
Creatinine, Ser: 2.97 mg/dL — ABNORMAL HIGH (ref 0.44–1.00)
GFR calc non Af Amer: 17 mL/min — ABNORMAL LOW (ref >60)
GFR, EST AFRICAN AMERICAN: 19 mL/min — AB (ref >60)
Glucose, Bld: 161 mg/dL — ABNORMAL HIGH (ref 70–99)
POTASSIUM: 3.5 mmol/L (ref 3.5–5.1)
SODIUM: 143 mmol/L (ref 135–145)

## 2017-09-29 LAB — HEMOGLOBIN A1C
Hgb A1c MFr Bld: 8.7 % — ABNORMAL HIGH (ref 4.8–5.6)
Mean Plasma Glucose: 202.99 mg/dL

## 2017-09-29 LAB — HEPATIC FUNCTION PANEL
ALBUMIN: 2.7 g/dL — AB (ref 3.5–5.0)
ALK PHOS: 113 U/L (ref 38–126)
ALT: 31 U/L (ref 0–44)
AST: 18 U/L (ref 15–41)
BILIRUBIN TOTAL: 0.4 mg/dL (ref 0.3–1.2)
Total Protein: 4.7 g/dL — ABNORMAL LOW (ref 6.5–8.1)

## 2017-09-29 LAB — CBC
HEMATOCRIT: 31.1 % — AB (ref 36.0–46.0)
Hemoglobin: 10.2 g/dL — ABNORMAL LOW (ref 12.0–15.0)
MCH: 27.3 pg (ref 26.0–34.0)
MCHC: 32.8 g/dL (ref 30.0–36.0)
MCV: 83.2 fL (ref 78.0–100.0)
Platelets: 287 10*3/uL (ref 150–400)
RBC: 3.74 MIL/uL — AB (ref 3.87–5.11)
RDW: 12.8 % (ref 11.5–15.5)
WBC: 8.2 10*3/uL (ref 4.0–10.5)

## 2017-09-29 LAB — GLUCOSE, CAPILLARY
Glucose-Capillary: 49 mg/dL — ABNORMAL LOW (ref 70–99)
Glucose-Capillary: 95 mg/dL (ref 70–99)

## 2017-09-29 LAB — HIV ANTIBODY (ROUTINE TESTING W REFLEX): HIV Screen 4th Generation wRfx: NONREACTIVE

## 2017-09-29 MED ORDER — INFLUENZA VAC SPLIT QUAD 0.5 ML IM SUSY
0.5000 mL | PREFILLED_SYRINGE | INTRAMUSCULAR | Status: AC | PRN
  Administered 2017-09-29: 0.5 mL via INTRAMUSCULAR
  Filled 2017-09-29: qty 0.5

## 2017-09-29 MED ORDER — METOCLOPRAMIDE HCL 10 MG PO TABS: 10.0000 mg | ORAL_TABLET | Freq: Three times a day (TID) | ORAL | Status: DC

## 2017-09-29 MED ORDER — HYDRALAZINE HCL 20 MG/ML IJ SOLN: 5.0000 mg | INTRAMUSCULAR | Status: DC | PRN

## 2017-09-29 MED ORDER — SODIUM CHLORIDE 0.9 % IV SOLN: 1000.0000 mL | INTRAVENOUS | Status: DC

## 2017-09-29 MED ORDER — INSULIN ASPART PROT & ASPART (70-30 MIX) 100 UNIT/ML ~~LOC~~ SUSP: 40.0000 [IU] | Freq: Two times a day (BID) | SUBCUTANEOUS | 11 refills | Status: DC

## 2017-09-29 MED ORDER — METOCLOPRAMIDE HCL 10 MG PO TABS: 10.0000 mg | ORAL_TABLET | Freq: Three times a day (TID) | ORAL | 1 refills | Status: DC

## 2017-09-29 MED FILL — METOCLOPRAMIDE 10 MG TABLET: 10 | 10 days supply | Qty: 30 | Fill #0

## 2017-09-29 MED FILL — NOVOLOG MIX 70/30 VIAL: (70-30) 100 | 12 days supply | Qty: 10 | Fill #0

## 2017-09-29 NOTE — Progress Notes (Signed)
Pt reports no nausea or pain.   Pt reports appetite is returning, with pt consuming 85% of breakfast.

## 2017-09-29 NOTE — Discharge Instructions (Signed)

## 2017-09-29 NOTE — Discharge Summary (Signed)
Physician Discharge Summary  Ann Bryan ITJ:[id number] DOB: 12/03/1960 DOA: 09/28/2017  PCP: Ladell Pier, MD  Admit date: 09/28/2017 Discharge date: 09/29/2017  Time spent: 65 minutes  Recommendations for Outpatient Follow-up:  1. Follow up with PCP 1-2 weeks for evaluation of BP control, diabetes control and kidney function.      Discharge Diagnoses:  Active Problems:   CKD (chronic kidney disease) stage 4, GFR 15-29 ml/min (HCC)   ARF (acute renal failure) (HCC)   Nausea and vomiting   DM (diabetes mellitus) type II uncontrolled with eye manifestation ([place])   Essential hypertension   Discharge Condition: stable at discharge  Diet recommendation: heart healthy carb modified  Filed Weights   09/28/17 2139 09/29/17 0528  Weight: 77.1 kg 80.5 kg    History of present illness:  Ann Bryan is a 57 y.o. female with a hx of diabetes mellitus type 2, hypertension, hyperlipidemia, chronic kidney disease stage IV presented to the ER on 09/28/17 with complaints of nausea vomiting for previous 3 days.  Patient stated her nausea vomiting started 3 days prior with multiple episodes. Denied coffee ground emesis.  Denied any diarrhea or abdominal pain.  Denied any recent travel or sick contacts.  Denied any recent use of antibiotics.  Following the first day her nausea vomiting improved but started recurring today again.  Hospital Course:  1. Intractable nausea vomiting -cause not clear but likely gastroparesis in setting of uncontrolled diabetes. Pt describes intermittent n/v every 3-4 days or so. Also describes reflux with lying down. Hepatic function panel within limits of normal as well as lipase.  Abdomen appears benign.  Nontender on exam. Patient provided IV fluids and able to tolerate diet quickly. Will discharge with reglan. Have requested consult from diabetes coordinator for education 2. Hypertension - lisinopril and hydrochlorothiazide initially held. Will resume at  discharge with close OP follow up with PCP for BP control and worsening kidney function. 3. Acute on chronic kidney disease stage IV likely worsening due to vomiting. Creatinine 3.5 on admission and 2.9 on discharge. this is closer to baseline. Will need close OP follow up with PCP for monitoring of kidney function. May benefit local nephrology consult 4. Diabetes mellitus type 2 -on NovoLog 70/30.  Home dose 60 units twice daily. She reports occasional "lows in 40's". Likely related to worsening renal function. Dosage decreased  to 40 units twice daily. At discharge.   Procedures:    Consultations:  none  Discharge Exam: Vitals:   09/29/17 0528 09/29/17 0800  BP: 140/71 (!) 165/71  Pulse: 89 94  Resp: 17   Temp: 98.1 F (36.7 C)   SpO2: 99%     General: well nourished sitting on side of bed in no acute distress Cardiovascular: rrr no mgr no LE edema Respiratory: normal effort BS clear bilaterally no wheeze Abdomen: obese soft +BS but sluggish no guarding or rebounding  Discharge Instructions   Discharge Instructions    Diet - low sodium heart healthy   Complete by:  As directed    Discharge instructions   Complete by:  As directed    Follow up with PCP 3-4 days to evaluate kidney function, BP control, diabetes control.   Increase activity slowly   Complete by:  As directed      Allergies as of 09/29/2017      Reactions   Doxycycline    REACTION: N/V      Medication List    STOP taking these medications  insulin NPH-regular Human (70-30) 100 UNIT/ML injection Commonly known as:  NOVOLIN 70/30 Replaced by:  insulin aspart protamine- aspart (70-30) 100 UNIT/ML injection     TAKE these medications   amLODipine 10 MG tablet Commonly known as:  NORVASC Take 1 tablet (10 mg total) by mouth daily.   atorvastatin 40 MG tablet Commonly known as:  LIPITOR Take 1 tablet (40 mg total) by mouth daily.   carvedilol 25 MG tablet Commonly known as:  COREG TAKE 1  TABLET BY MOUTH 2 TIMES DAILY WITH A MEAL.   cetirizine 10 MG tablet Commonly known as:  ZYRTEC Take 1 tablet (10 mg total) by mouth daily.   cyclobenzaprine 10 MG tablet Commonly known as:  FLEXERIL Take 1 tablet (10 mg total) by mouth daily as needed for muscle spasms.   glucose blood test strip 1 each by Other route 3 (three) times daily.   glucose blood test strip Use as instructed   insulin aspart protamine- aspart (70-30) 100 UNIT/ML injection Commonly known as:  NOVOLOG MIX 70/30 Inject 0.4 mLs (40 Units total) into the skin 2 (two) times daily with a meal. Replaces:  insulin NPH-regular Human (70-30) 100 UNIT/ML injection   INSULIN SYRINGE 1CC/31GX5/16" 31G X 5/16" 1 ML Misc Use as directed.  BD 49m x 31G needles   Insulin Syringes (Disposable) U-100 0.5 ML Misc 1 each by Does not apply route 2 (two) times daily.   liraglutide 18 MG/3ML Sopn Commonly known as:  VICTOZA Inject 0.3 mLs (1.8 mg total) into the skin daily.   lisinopril-hydrochlorothiazide 20-12.5 MG tablet Commonly known as:  PRINZIDE,ZESTORETIC Take 2 tablets by mouth daily.   metoCLOPramide 10 MG tablet Commonly known as:  REGLAN Take 1 tablet (10 mg total) by mouth 3 (three) times daily before meals.   Pen Needles 31G X 6 MM Misc Use as directed   timolol 0.5 % ophthalmic solution Commonly known as:  BETIMOL Place 1 drop into both eyes daily.   TRUE METRIX METER w/Device Kit 1 each by Does not apply route as needed.   TRUEPLUS LANCETS 28G Misc 1 each by Does not apply route 3 (three) times daily.   zolpidem 5 MG tablet Commonly known as:  AMBIEN Take 1 tablet (5 mg total) by mouth at bedtime as needed. Each prescription to last a month.      Allergies  Allergen Reactions  . Doxycycline     REACTION: N/V      The results of significant diagnostics from this hospitalization (including imaging, microbiology, ancillary and laboratory) are listed below for reference.     Significant Diagnostic Studies: No results found.  Microbiology: No results found for this or any previous visit (from the past 240 hour(s)).   Labs: Basic Metabolic Panel: Recent Labs  Lab 09/28/17 1613 09/29/17 0225  NA 143 143  K 4.3 3.5  CL 104 114*  CO2 23 21*  GLUCOSE 76 161*  BUN 57* 49*  CREATININE 3.52* 2.97*  CALCIUM 9.6 7.7*   Liver Function Tests: Recent Labs  Lab 09/28/17 1613 09/29/17 0225  AST 29 18  ALT 47* 31  ALKPHOS 150* 113  BILITOT 0.6 0.4  PROT 6.8 4.7*  ALBUMIN 4.0 2.7*   Recent Labs  Lab 09/28/17 1613  LIPASE 46   No results for input(s): AMMONIA in the last 168 hours. CBC: Recent Labs  Lab 09/28/17 1613 09/29/17 0225  WBC 9.0 8.2  HGB 13.8 10.2*  HCT 41.6 31.1*  MCV 83.0 83.2  PLT 395 287   Cardiac Enzymes: No results for input(s): CKTOTAL, CKMB, CKMBINDEX, TROPONINI in the last 168 hours. BNP: BNP (last 3 results) No results for input(s): BNP in the last 8760 hours.  ProBNP (last 3 results) No results for input(s): PROBNP in the last 8760 hours.  CBG: Recent Labs  Lab 09/28/17 2205 09/28/17 2240 09/28/17 2344 09/29/17 0726 09/29/17 0759  GLUCAP 48* 97 168* 49* 95       Signed:  Radene Gunning MD.  [facility] 09/29/2017, 10:09 AM

## 2017-09-29 NOTE — Care Management Note (Addendum)
Case Management Note  Patient Details  Name: DAHLILA PFAHLER MRN: [id number] Date of Birth: 10/17/1960  Subjective/Objective:  57 y.o. Female who present with nausea and vomiting since this past weekend.                   Action/Plan: CM consult acknowledged. CM discussed CM consult for request to schedule an outpatient nephrology appointment with the medical team, with recommendations on AVS for patient to f/u with her PCP for evaluation of her kidney function. CM met with patient to discuss transitional needs. Patient verbalized being followed by [facility] for PCP and Rx needs, and denied any difficulty obtaining her Rx. Patient confirmed PCP as: Dr. Karle Plumber. Patient lives at home, independent with ADLs PTA, with no DME in use. Patient indicated she will utilize public transportation for transport home. No further needs from CM.   Expected Discharge Date:  09/29/17               Expected Discharge Plan:  Home/Self Care  In-House Referral:  NA  Discharge planning Services  CM Consult  Post Acute Care Choice:  NA Choice offered to:  NA  DME Arranged:  N/A DME Agency:  NA  HH Arranged:  NA HH Agency:  NA  Status of Service:  Completed, signed off  If discussed at [place] of Stay Meetings, dates discussed:    Additional Comments:  Midge Minium RN, BSN, NCM-BC, ACM-RN [phone] 09/29/2017, 9:21 AM

## 2017-09-29 NOTE — Progress Notes (Signed)
Pt stable, ambulatory, and verbalizes understanding of d/c instructions.  Pt has no further questions other than when she could pick up meds from pharmacy.   This RN spoke with [facility] in regards to pt medications and when she would be able to pick those up and they stated "should be ready within the hour.  Pt aware.

## 2017-10-07 ENCOUNTER — Telehealth: Payer: Self-pay

## 2017-10-07 ENCOUNTER — Ambulatory Visit: Payer: Self-pay | Attending: Internal Medicine

## 2017-10-07 NOTE — Telephone Encounter (Signed)
Met with patient when she was in the clinic today and assisted her with completing a SCAT application

## 2017-10-12 ENCOUNTER — Telehealth: Payer: Self-pay

## 2017-10-12 NOTE — Telephone Encounter (Signed)
Completed SCAT application faxed to [employer] eligibility.  

## 2017-10-13 ENCOUNTER — Telehealth: Payer: Self-pay | Admitting: Pharmacist

## 2017-10-13 ENCOUNTER — Other Ambulatory Visit: Payer: Self-pay | Admitting: Internal Medicine

## 2017-10-13 MED ORDER — INSULIN ASPART PROT & ASPART (70-30 MIX) 100 UNIT/ML PEN: 40.0000 [IU] | PEN_INJECTOR | Freq: Two times a day (BID) | SUBCUTANEOUS | 2 refills | Status: DC

## 2017-10-13 MED FILL — NOVOLOG MIX 70-30 FLEXPEN S: (70-30) 100 | 30 days supply | Qty: 24 | Fill #0

## 2017-10-13 NOTE — Telephone Encounter (Signed)
PC placed. Patient was informed of switch to Novolog 70/30 pen. She is comfortable with the working of the pen. She knows to call me with additional questions. I offered to demonstrate technique when she comes to pick up medication. She believes she understands proper technique but will contact me with additional questions.

## 2017-10-13 NOTE — Telephone Encounter (Signed)
Received request from [facility] pharmacy concerning Novolog 70/30 refill. Pt recently hospitalized. Reviewed discharge instructions. Apparently, the hospital changed pt's Novolin 70/30 to Novolog 70/30. However, the pharmacy here is unable to provide Novolog 70/30 vials. We do have Novolog 70/30 in the pen. Will route information to patient's PCP for follow-up.

## 2017-10-14 NOTE — Telephone Encounter (Signed)
Patient has an appointment for the 28th of this month

## 2017-10-28 MED FILL — METOCLOPRAMIDE 10 MG TABLET: 10 | 10 days supply | Qty: 30 | Fill #1

## 2017-10-29 MED FILL — CARVEDILOL 25 MG TABLET: 25 | 30 days supply | Qty: 60 | Fill #2

## 2017-11-02 ENCOUNTER — Encounter: Payer: Self-pay | Admitting: Internal Medicine

## 2017-11-02 ENCOUNTER — Ambulatory Visit: Payer: Self-pay | Attending: Internal Medicine | Admitting: Internal Medicine

## 2017-11-02 VITALS — BP 150/84 | HR 88 | Temp 98.3°F | Resp 16 | Wt 173.8 lb

## 2017-11-02 DIAGNOSIS — F332 Major depressive disorder, recurrent severe without psychotic features: Secondary | ICD-10-CM | POA: Insufficient documentation

## 2017-11-02 DIAGNOSIS — R1110 Vomiting, unspecified: Secondary | ICD-10-CM

## 2017-11-02 DIAGNOSIS — E785 Hyperlipidemia, unspecified: Secondary | ICD-10-CM | POA: Insufficient documentation

## 2017-11-02 DIAGNOSIS — I10 Essential (primary) hypertension: Secondary | ICD-10-CM

## 2017-11-02 DIAGNOSIS — E1122 Type 2 diabetes mellitus with diabetic chronic kidney disease: Secondary | ICD-10-CM | POA: Insufficient documentation

## 2017-11-02 DIAGNOSIS — Z794 Long term (current) use of insulin: Secondary | ICD-10-CM | POA: Insufficient documentation

## 2017-11-02 DIAGNOSIS — R252 Cramp and spasm: Secondary | ICD-10-CM

## 2017-11-02 DIAGNOSIS — F419 Anxiety disorder, unspecified: Secondary | ICD-10-CM | POA: Insufficient documentation

## 2017-11-02 DIAGNOSIS — N184 Chronic kidney disease, stage 4 (severe): Secondary | ICD-10-CM

## 2017-11-02 DIAGNOSIS — G4700 Insomnia, unspecified: Secondary | ICD-10-CM | POA: Insufficient documentation

## 2017-11-02 DIAGNOSIS — Z79899 Other long term (current) drug therapy: Secondary | ICD-10-CM | POA: Insufficient documentation

## 2017-11-02 DIAGNOSIS — E118 Type 2 diabetes mellitus with unspecified complications: Secondary | ICD-10-CM | POA: Insufficient documentation

## 2017-11-02 DIAGNOSIS — E1169 Type 2 diabetes mellitus with other specified complication: Secondary | ICD-10-CM

## 2017-11-02 DIAGNOSIS — E1121 Type 2 diabetes mellitus with diabetic nephropathy: Secondary | ICD-10-CM | POA: Insufficient documentation

## 2017-11-02 DIAGNOSIS — Z8249 Family history of ischemic heart disease and other diseases of the circulatory system: Secondary | ICD-10-CM | POA: Insufficient documentation

## 2017-11-02 DIAGNOSIS — I129 Hypertensive chronic kidney disease with stage 1 through stage 4 chronic kidney disease, or unspecified chronic kidney disease: Secondary | ICD-10-CM | POA: Insufficient documentation

## 2017-11-02 DIAGNOSIS — IMO0002 Reserved for concepts with insufficient information to code with codable children: Secondary | ICD-10-CM

## 2017-11-02 DIAGNOSIS — J302 Other seasonal allergic rhinitis: Secondary | ICD-10-CM | POA: Insufficient documentation

## 2017-11-02 DIAGNOSIS — E1165 Type 2 diabetes mellitus with hyperglycemia: Secondary | ICD-10-CM

## 2017-11-02 LAB — GLUCOSE, POCT (MANUAL RESULT ENTRY): POC Glucose: 165 mg/dl — AB (ref 70–99)

## 2017-11-02 MED ORDER — CYCLOBENZAPRINE HCL 10 MG PO TABS: 10.0000 mg | ORAL_TABLET | Freq: Every day | ORAL | 2 refills | Status: DC | PRN

## 2017-11-02 MED ORDER — ATORVASTATIN CALCIUM 40 MG PO TABS: 40.0000 mg | ORAL_TABLET | Freq: Every day | ORAL | 3 refills | Status: DC

## 2017-11-02 MED ORDER — AMLODIPINE BESYLATE 10 MG PO TABS: 10.0000 mg | ORAL_TABLET | Freq: Every day | ORAL | 3 refills | Status: DC

## 2017-11-02 MED ORDER — LIRAGLUTIDE 18 MG/3ML ~~LOC~~ SOPN: 1.8000 mg | PEN_INJECTOR | Freq: Every day | SUBCUTANEOUS | 11 refills | Status: DC

## 2017-11-02 MED ORDER — CARVEDILOL 25 MG PO TABS: 37.5000 mg | ORAL_TABLET | Freq: Two times a day (BID) | ORAL | 6 refills | Status: DC

## 2017-11-02 MED ORDER — LISINOPRIL-HYDROCHLOROTHIAZIDE 20-12.5 MG PO TABS: 2.0000 | ORAL_TABLET | Freq: Every day | ORAL | 6 refills | Status: DC

## 2017-11-02 MED FILL — CYCLOBENZAPRINE 10 MG TAB: 10 | 30 days supply | Qty: 30 | Fill #0

## 2017-11-02 MED FILL — $VICTOZA 2-PAK 18MG/3ML PEN: 18 | 84 days supply | Qty: 27 | Fill #0

## 2017-11-02 MED FILL — LISINOPRIL-HCTZ 20-12.5 MG: 20-12.5 | 30 days supply | Qty: 60 | Fill #0

## 2017-11-02 MED FILL — AMLODIPINE BESYLATE 10 MG T: 10 | 30 days supply | Qty: 30 | Fill #0

## 2017-11-02 NOTE — Patient Instructions (Signed)
Your blood pressure is not controlled.  Increase carvedilol to 25 mg 1-1/2 tablet twice a day.  You have been referred for gastric emptying study.  Please call [facility] again and inquire about a payment plan so that you can schedule to see a nephrologist.  Please check your blood sugars at least once a day alternating before breakfast and dinner.  After you have about 2 weeks worth of log, you can drop them off for me to review and give further instructions on adjustment of insulin.

## 2017-11-02 NOTE — Progress Notes (Signed)
Patient ID: Ann Bryan, female    DOB: 16-Jun-1960  MRN: [id number]  CC: Hospitalization Follow-up   Subjective: Ann Bryan is a 57 y.o. female who presents for hosp f/u Her concerns today include:  Patient with history of DM, HTN, HL, seasonal allergies,anxiety/depression, glucoma, nephrotic range proteinuriaand insomnia  Pt hosp 9/23-24/2019 for n/v with associated acute on chronic CKD.  Patient was hydrated.  Lisinopril HCTZ was placed on hold.  Patient thought to possibly have gastroparesis.  She was discharged on Reglan.  Since discharge, she has been feeling better but still has some intermittent episodes of nausea/vomiting.  She takes Reglan once a day only on the weekends because it makes her drowsy.   HTN:  Reports compliance with meds and salt restriction. No device to check BP After she was discharged from the hospital, she resumed taking lisinopril/HCTZ.  She is also on carvedilol and Norvasc.  She has taken medicines already for today.  CKD stage 4: She applied for financial assistance through [facility]  She received a response from them in August stating that she does not qualify for financial assistance.  Her referral to the nephrologist, according to them, qualifies as an elective referral.  However the latter did state that she can call to inquire about discounts or to set up a payment plan.  She has not done that .   She is thankful that she is now able to get around by using [employer].  Works 2 days a wk for total of 12 hrs.  Would like to get another job that offers insurnace.  However she started having cramping in her fingers and neck after not having them for a while.  She feels that the symptoms would be distracting for her at work.  She requests a refill on Flexeril which she has not had to use in a year.    DM: not checking BS regularly due to limited stripes; unable to afford them consistently.  She was having frequent low BS over summer Discgh from  the hospital last month on Novolog 70/30 40 units BID from hosp. low blood sugar episodes have been less since being on NovoLog 70/30.   Patient Active Problem List   Diagnosis Date Noted  . ARF (acute renal failure) ([place]) 09/28/2017  . Nausea and vomiting 09/28/2017  . Diabetic nephropathy associated with type 2 diabetes mellitus ([place]) 06/05/2017  . Insomnia 06/05/2017  . CKD (chronic kidney disease) stage 4, GFR 15-29 ml/min (HCC) 06/05/2017  . Chronic seasonal allergic rhinitis 12/10/2015  . Arthralgia of both hands 12/10/2015  . Severe episode of recurrent major depressive disorder, without psychotic features ([place]) 12/10/2015  . Abnormality of gait 04/13/2012  . NEPHROPATHY, DIABETIC 09/23/2006  . Hyperlipidemia associated with type 2 diabetes mellitus ([place]) 12/23/2005  . Early onset dysthymia 12/23/2005  . Essential hypertension 12/23/2005  . DM (diabetes mellitus) type II uncontrolled with eye manifestation ([place]) 01/07/1984     Current Outpatient Medications on File Prior to Visit  Medication Sig Dispense Refill  . Blood Glucose Monitoring Suppl (TRUE METRIX METER) w/Device KIT 1 each by Does not apply route as needed. 1 kit 0  . cetirizine (ZYRTEC) 10 MG tablet Take 1 tablet (10 mg total) by mouth daily. 30 tablet 2  . glucose blood (TRUE METRIX BLOOD GLUCOSE TEST) test strip 1 each by Other route 3 (three) times daily. 100 each 11  . glucose blood (TRUE METRIX BLOOD GLUCOSE TEST) test strip Use as  instructed 100 each 12  . insulin aspart protamine - aspart (NOVOLOG MIX 70/30 FLEXPEN) (70-30) 100 UNIT/ML FlexPen Inject 0.4 mLs (40 Units total) into the skin 2 (two) times daily. 15 mL 2  . Insulin Pen Needle (PEN NEEDLES) 31G X 6 MM MISC Use as directed 100 each 6  . Insulin Syringe-Needle U-100 (INSULIN SYRINGE 1CC/31GX5/16") 31G X 5/16" 1 ML MISC Use as directed.  BD 58m x 31G needles 100 each 6  . Insulin Syringes, Disposable, U-100 0.5 ML MISC 1 each by Does not apply route 2  (two) times daily. 100 each 8  . metoCLOPramide (REGLAN) 10 MG tablet Take 1 tablet (10 mg total) by mouth 3 (three) times daily before meals. 30 tablet 1  . timolol (BETIMOL) 0.5 % ophthalmic solution Place 1 drop into both eyes daily. 10 mL 3  . TRUEPLUS LANCETS 28G MISC 1 each by Does not apply route 3 (three) times daily. 100 each 12  . zolpidem (AMBIEN) 5 MG tablet Take 1 tablet (5 mg total) by mouth at bedtime as needed. Each prescription to last a month. 12 tablet 1   No current facility-administered medications on file prior to visit.     Allergies  Allergen Reactions  . Doxycycline     REACTION: N/V    Social History   Socioeconomic History  . Marital status: Single    Spouse name: Not on file  . Number of children: Not on file  . Years of education: Not on file  . Highest education level: Not on file  Occupational History  . Not on file  Social Needs  . Financial resource strain: Not on file  . Food insecurity:    Worry: Not on file    Inability: Not on file  . Transportation needs:    Medical: Not on file    Non-medical: Not on file  Tobacco Use  . Smoking status: Never Smoker  . Smokeless tobacco: Never Used  Substance and Sexual Activity  . Alcohol use: No  . Drug use: No  . Sexual activity: Not Currently  Lifestyle  . Physical activity:    Days per week: Not on file    Minutes per session: Not on file  . Stress: Not on file  Relationships  . Social connections:    Talks on phone: Not on file    Gets together: Not on file    Attends religious service: Not on file    Active member of club or organization: Not on file    Attends meetings of clubs or organizations: Not on file    Relationship status: Not on file  . Intimate partner violence:    Fear of current or ex partner: Not on file    Emotionally abused: Not on file    Physically abused: Not on file    Forced sexual activity: Not on file  Other Topics Concern  . Not on file  Social History  Narrative  . Not on file    Family History  Problem Relation Age of Onset  . Diabetes Mother   . Stroke Mother   . Hypertension Mother   . Alcohol abuse Mother   . Depression Father        Committed suicide  . Alcohol abuse Maternal Uncle     Past Surgical History:  Procedure Laterality Date  . ABDOMINAL HYSTERECTOMY   2004    ROS: Review of Systems Negative except as above. PHYSICAL EXAM: BP (!) 150/84  Pulse 88   Temp 98.3 F (36.8 C) (Oral)   Resp 16   Wt 173 lb 12.8 oz (78.8 kg)   LMP 03/13/2002   SpO2 97%   BMI 33.94 kg/m   Wt Readings from Last 3 Encounters:  11/02/17 173 lb 12.8 oz (78.8 kg)  09/29/17 177 lb 7.5 oz (80.5 kg)  06/04/17 174 lb 12.8 oz (79.3 kg)   Physical Exam General appearance - alert, well appearing, and in no distress Mental status - normal mood, behavior, speech, dress, motor activity, and thought processes Neck - supple, no significant adenopathy Chest - clear to auscultation, no wheezes, rales or rhonchi, symmetric air entry Heart - normal rate, regular rhythm, normal S1, S2, no murmurs, rubs, clicks or gallops Extremities - peripheral pulses normal, no pedal edema, no clubbing or cyanosis   Results for orders placed or performed in visit on 11/02/17  POCT glucose (manual entry)  Result Value Ref Range   POC Glucose 165 (A) 70 - 99 mg/dl   Lab Results  Component Value Date   HGBA1C 8.7 (H) 09/29/2017    ASSESSMENT AND PLAN: 1. Uncontrolled type 2 diabetes mellitus with complication, with long-term current use of insulin (HCC) Refill Victoza and NovoLog 70/30. When she is able to afford the strips for her glucose meter, recommend that she checks blood sugars at least once a day alternating before breakfast and dinner.  After she has logged about 2 weeks of blood sugar readings, she can drop them off to my CMA for me to review and I will get back with her with recommendations - liraglutide (VICTOZA) 18 MG/3ML SOPN; Inject 0.3  mLs (1.8 mg total) into the skin daily.  Dispense: 9 mL; Refill: 11 - CBC - Basic Metabolic Panel  2. Essential hypertension Not at goal.  Increase carvedilol to 25 mg 1-1/2 tablets twice a day. - carvedilol (COREG) 25 MG tablet; Take 1.5 tablets (37.5 mg total) by mouth 2 (two) times daily with a meal.  Dispense: 90 tablet; Refill: 6 - amLODipine (NORVASC) 10 MG tablet; Take 1 tablet (10 mg total) by mouth daily.  Dispense: 90 tablet; Refill: 3 - lisinopril-hydrochlorothiazide (ZESTORETIC) 20-12.5 MG tablet; Take 2 tablets by mouth daily.  Dispense: 180 tablet; Refill: 6  3. CKD (chronic kidney disease) stage 4, GFR 15-29 ml/min (HCC) Patient is out of options in terms of getting to a nephrologist.  I suggest that she call the number that is documented in the letter from [facility] to inquire about any discount or to set up a payment plan so that she can get in with the nephrologist  4. Muscle cramps - cyclobenzaprine (FLEXERIL) 10 MG tablet; Take 1 tablet (10 mg total) by mouth daily as needed for muscle spasms.  Dispense: 30 tablet; Refill: 2  5. Hyperlipidemia associated with type 2 diabetes mellitus (HCC) - POCT glucose (manual entry) - atorvastatin (LIPITOR) 40 MG tablet; Take 1 tablet (40 mg total) by mouth daily.  Dispense: 90 tablet; Refill: 3  6. Recurrent vomiting Will do formal gastric emptying study to evaluate for gastroparesis.  Advised patient that regular daily use of Reglan can cause movement disorder.  Advised her to take it only when she needs to.  I also recommend that she not take it for 1 to 2 weeks prior to her gastric emptying study - NM GASTRIC EMPTYING; Future   Patient was given the opportunity to ask questions.  Patient verbalized understanding of the plan and was able to  repeat key elements of the plan.   Orders Placed This Encounter  Procedures  . NM GASTRIC EMPTYING  . CBC  . Basic Metabolic Panel  . POCT glucose (manual entry)      Requested Prescriptions   Signed Prescriptions Disp Refills  . cyclobenzaprine (FLEXERIL) 10 MG tablet 30 tablet 2    Sig: Take 1 tablet (10 mg total) by mouth daily as needed for muscle spasms.  . carvedilol (COREG) 25 MG tablet 90 tablet 6    Sig: Take 1.5 tablets (37.5 mg total) by mouth 2 (two) times daily with a meal.  . liraglutide (VICTOZA) 18 MG/3ML SOPN 9 mL 11    Sig: Inject 0.3 mLs (1.8 mg total) into the skin daily.  Marland Kitchen amLODipine (NORVASC) 10 MG tablet 90 tablet 3    Sig: Take 1 tablet (10 mg total) by mouth daily.  Marland Kitchen atorvastatin (LIPITOR) 40 MG tablet 90 tablet 3    Sig: Take 1 tablet (40 mg total) by mouth daily.  Marland Kitchen lisinopril-hydrochlorothiazide (ZESTORETIC) 20-12.5 MG tablet 180 tablet 6    Sig: Take 2 tablets by mouth daily.    Return in about 2 months (around 01/02/2018).  Karle Plumber, MD, FACP

## 2017-11-03 ENCOUNTER — Encounter: Payer: Self-pay | Admitting: Internal Medicine

## 2017-11-03 DIAGNOSIS — I10 Essential (primary) hypertension: Secondary | ICD-10-CM

## 2017-11-03 LAB — BASIC METABOLIC PANEL
BUN/Creatinine Ratio: 17 (ref 9–23)
BUN: 64 mg/dL — AB (ref 6–24)
CHLORIDE: 107 mmol/L — AB (ref 96–106)
CO2: 19 mmol/L — ABNORMAL LOW (ref 20–29)
Calcium: 9.6 mg/dL (ref 8.7–10.2)
Creatinine, Ser: 3.68 mg/dL — ABNORMAL HIGH (ref 0.57–1.00)
GFR calc Af Amer: 15 mL/min/{1.73_m2} — ABNORMAL LOW (ref >59)
GFR calc non Af Amer: 13 mL/min/{1.73_m2} — ABNORMAL LOW (ref >59)
GLUCOSE: 49 mg/dL — AB (ref 65–99)
POTASSIUM: 4.8 mmol/L (ref 3.5–5.2)
Sodium: 143 mmol/L (ref 134–144)

## 2017-11-03 LAB — CBC
HEMOGLOBIN: 12 g/dL (ref 11.1–15.9)
Hematocrit: 36.6 % (ref 34.0–46.6)
MCH: 27.8 pg (ref 26.6–33.0)
MCHC: 32.8 g/dL (ref 31.5–35.7)
MCV: 85 fL (ref 79–97)
Platelets: 298 10*3/uL (ref 150–450)
RBC: 4.31 x10E6/uL (ref 3.77–5.28)
RDW: 13.1 % (ref 12.3–15.4)
WBC: 6.1 10*3/uL (ref 3.4–10.8)

## 2017-11-03 MED ORDER — LISINOPRIL-HYDROCHLOROTHIAZIDE 20-12.5 MG PO TABS: 1.0000 | ORAL_TABLET | Freq: Every day | ORAL | 6 refills | Status: DC

## 2017-11-03 NOTE — Progress Notes (Signed)
Lab note:  Message sent to CMA: Let pt know that her kidney function is worse compared to when she was dischg from the hospital.  I recommend decreasing Lisinopril 20 mg/HCTZ 12.5 mg to 1 tablet daily.  Give appt with Lurena Joiner in 1 wk for repeat BP check. If BP not at goal on that visit, we can change HCTZ to 25 mg once a day or add Hydralazine. Results for orders placed or performed in visit on 11/02/17  CBC  Result Value Ref Range   WBC 6.1 3.4 - 10.8 x10E3/uL   RBC 4.31 3.77 - 5.28 x10E6/uL   Hemoglobin 12.0 11.1 - 15.9 g/dL   Hematocrit 36.6 34.0 - 46.6 %   MCV 85 79 - 97 fL   MCH 27.8 26.6 - 33.0 pg   MCHC 32.8 31.5 - 35.7 g/dL   RDW 13.1 12.3 - 15.4 %   Platelets 298 150 - 450 [id number]  Basic Metabolic Panel  Result Value Ref Range   Glucose 49 (L) 65 - 99 mg/dL   BUN 64 (H) 6 - 24 mg/dL   Creatinine, Ser 3.68 (H) 0.57 - 1.00 mg/dL   GFR calc non Af Amer 13 (L) >59 mL/min/1.73   GFR calc Af Amer 15 (L) >59 mL/min/1.73   BUN/Creatinine Ratio 17 9 - 23   Sodium 143 134 - 144 mmol/L   Potassium 4.8 3.5 - 5.2 mmol/L   Chloride 107 (H) 96 - 106 mmol/L   CO2 19 (L) 20 - 29 mmol/L   Calcium 9.6 8.7 - 10.2 mg/dL  POCT glucose (manual entry)  Result Value Ref Range   POC Glucose 165 (A) 70 - 99 mg/dl

## 2017-11-04 ENCOUNTER — Ambulatory Visit: Payer: Self-pay

## 2017-11-04 ENCOUNTER — Telehealth: Payer: Self-pay

## 2017-11-04 NOTE — Telephone Encounter (Signed)
Contacted pt to go over lab results and to schedule an appointment with luke pt is aware and doesn't have any questions or concers

## 2017-11-09 ENCOUNTER — Encounter (HOSPITAL_COMMUNITY): Payer: Self-pay

## 2017-11-09 ENCOUNTER — Encounter (HOSPITAL_COMMUNITY)
Admission: RE | Admit: 2017-11-09 | Discharge: 2017-11-09 | Disposition: A | Discharge status: Home / Self Care | Payer: Self-pay | Source: Ambulatory Visit | Attending: Internal Medicine | Admitting: Internal Medicine

## 2017-11-09 DIAGNOSIS — R1110 Vomiting, unspecified: Secondary | ICD-10-CM

## 2017-11-10 ENCOUNTER — Ambulatory Visit (HOSPITAL_BASED_OUTPATIENT_CLINIC_OR_DEPARTMENT_OTHER): Payer: Self-pay | Admitting: Pharmacist

## 2017-11-10 ENCOUNTER — Telehealth: Payer: Self-pay

## 2017-11-10 ENCOUNTER — Ambulatory Visit: Payer: Self-pay | Attending: Internal Medicine

## 2017-11-10 VITALS — BP 132/80 | HR 88

## 2017-11-10 DIAGNOSIS — Z823 Family history of stroke: Secondary | ICD-10-CM | POA: Insufficient documentation

## 2017-11-10 DIAGNOSIS — Z8249 Family history of ischemic heart disease and other diseases of the circulatory system: Secondary | ICD-10-CM | POA: Insufficient documentation

## 2017-11-10 DIAGNOSIS — I10 Essential (primary) hypertension: Secondary | ICD-10-CM

## 2017-11-10 DIAGNOSIS — Z833 Family history of diabetes mellitus: Secondary | ICD-10-CM | POA: Insufficient documentation

## 2017-11-10 DIAGNOSIS — Z79899 Other long term (current) drug therapy: Secondary | ICD-10-CM | POA: Insufficient documentation

## 2017-11-10 MED FILL — ?ATORVASTATIN 40MG TABLET: 40 | 30 days supply | Qty: 30 | Fill #0

## 2017-11-10 NOTE — Progress Notes (Signed)
   S:    PCP: Dr. Wynetta Emery  Patient arrives anxious. She states that she has a lot of things going on.  Presents to the clinic for hypertension management. Patient was referred and last seen by Dr. Wynetta Emery on 11/02/17. Dr. Wynetta Emery noted worsening of renal function that prompted a decrease in lisinopril-HCTZ dose.   Patient denies CP, SOB, HA or blurred vision. Reports adherence with medications.  Current BP Medications include:   - amlodipine 10 mg daily - carvedilol 25 mg: 1.5 (37.5 mg) tablets BID - Lisinopril-HCTZ 20-12.5 mg daily  Dietary habits include:  - Reports eating [employer] (works there) - Reports decreasing to <1 cup of coffee, still trying to cut out soda Exercise habits include: - Denies  - Thinking about joining the local [employer] Family / Social history:  - FH: DM, stroke, and HTN (mother) - Tobacco: never smoker - Alcohol: denies  Home BP readings:  - does not monitor at home  O:  L arm after 5 minutes rest: 132/80, HR 88 Last 3 Office BP readings: BP Readings from Last 3 Encounters:  11/02/17 (!) 150/84  09/29/17 (!) 165/71  06/04/17 (!) 173/96    BMET    Component Value Date/Time   NA 143 11/02/2017 1651   K 4.8 11/02/2017 1651   CL 107 (H) 11/02/2017 1651   CO2 19 (L) 11/02/2017 1651   GLUCOSE 49 (L) 11/02/2017 1651   GLUCOSE 161 (H) 09/29/2017 0225   BUN 64 (H) 11/02/2017 1651   CREATININE 3.68 (H) 11/02/2017 1651   CREATININE 1.69 (H) 12/10/2015 1145   CALCIUM 9.6 11/02/2017 1651   GFRNONAA 13 (L) 11/02/2017 1651   GFRNONAA 34 (L) 12/10/2015 1145   GFRAA 15 (L) 11/02/2017 1651   GFRAA 39 (L) 12/10/2015 1145    Renal function: Estimated Creatinine Clearance: 16 mL/min (A) (by C-G formula based on SCr of 3.68 mg/dL (H)).  A/P: Hypertension longstanding currently above goal but much improved. BP Goal <130/80 mmHg. Patient is adherent with current medications.   Of note, pt states that she believes her BP was elevated last time b/c she didn't  rest before it was taken. With patient's CKD, she may require loop diuretic addition to her current regimen. Data is conflicting but loops sometimes can increase the efficacy of thiazides in the setting of CKD IV. Discussed this with pt's PCP today. We will hold off on furosemide for now.   -Continued anti-hypertensives.  -Counseled on lifestyle modifications for blood pressure control including reduced dietary sodium, increased exercise, adequate sleep  Results reviewed and written information provided. Total time in face-to-face counseling 30 minutes.   F/U Clinic Visit in 01/04/18.    Patient seen with: Dixon Boos, PharmD Candidate [employer] 2021  Benard Halsted, PharmD, [place] [phone]

## 2017-11-10 NOTE — Patient Instructions (Signed)
Thank you for coming to see [place] today.   Blood pressure today is improved.   Continue taking blood pressure medications as prescribed.   Limiting salt and caffeine, as well as exercising as able for at least 30 minutes for 5 days out of the week, can also help you lower your blood pressure.  Take your blood pressure at home if you are able. Please write down these numbers and bring them to your visits.  If you have any questions about medications, please call me [phone].  Lurena Joiner

## 2017-11-10 NOTE — Telephone Encounter (Signed)
This CM spoke to Ann Bryan/[employer] who confirmed that the patient has been certified for services.

## 2017-11-16 ENCOUNTER — Ambulatory Visit (HOSPITAL_COMMUNITY): Admission: RE | Admit: 2017-11-16 | Payer: Self-pay | Source: Ambulatory Visit

## 2017-11-16 ENCOUNTER — Encounter (HOSPITAL_COMMUNITY): Payer: Self-pay

## 2017-11-17 ENCOUNTER — Ambulatory Visit (HOSPITAL_COMMUNITY)
Admission: RE | Admit: 2017-11-17 | Discharge: 2017-11-17 | Disposition: A | Discharge status: Home / Self Care | Payer: Self-pay | Source: Ambulatory Visit | Attending: Internal Medicine | Admitting: Internal Medicine

## 2017-11-17 DIAGNOSIS — R1110 Vomiting, unspecified: Secondary | ICD-10-CM | POA: Insufficient documentation

## 2017-11-17 DIAGNOSIS — E119 Type 2 diabetes mellitus without complications: Secondary | ICD-10-CM | POA: Insufficient documentation

## 2017-11-17 MED ORDER — TECHNETIUM TC 99M SULFUR COLLOID
2.1000 | Freq: Once | INTRAVENOUS | Status: AC | PRN
  Administered 2017-11-17: 2.1 via INTRAVENOUS

## 2017-11-19 MED FILL — NOVOLOG MIX 70-30 FLEXPEN S: (70-30) 100 | 26 days supply | Qty: 21 | Fill #1

## 2017-11-20 MED FILL — ?CARVEDILOL 25 MG TABLET: 25 | 30 days supply | Qty: 90 | Fill #0

## 2017-11-21 ENCOUNTER — Other Ambulatory Visit (HOSPITAL_COMMUNITY): Payer: Self-pay

## 2017-12-21 ENCOUNTER — Other Ambulatory Visit: Payer: Self-pay | Admitting: Internal Medicine

## 2017-12-21 MED FILL — AMLODIPINE BESYLATE 10 MG T: 10 | 30 days supply | Qty: 30 | Fill #1

## 2017-12-21 MED FILL — ?ATORVASTATIN 40MG TABLET: 40 | 30 days supply | Qty: 30 | Fill #1

## 2017-12-21 MED FILL — $NOVOLOG MIX 70-30 FLEXPEN: (70-30) 100 | 56 days supply | Qty: 45 | Fill #0

## 2017-12-31 MED FILL — ?CARVEDILOL 25 MG TABLET: 25 | 30 days supply | Qty: 90 | Fill #1

## 2017-12-31 MED FILL — LISINOPRIL-HCTZ 20-12.5 MG: 20-12.5 | 30 days supply | Qty: 60 | Fill #1

## 2018-01-04 ENCOUNTER — Ambulatory Visit: Payer: Self-pay | Attending: Internal Medicine | Admitting: Internal Medicine

## 2018-01-04 ENCOUNTER — Encounter: Payer: Self-pay | Admitting: Internal Medicine

## 2018-01-04 ENCOUNTER — Ambulatory Visit: Payer: Self-pay | Attending: Internal Medicine | Admitting: Licensed Clinical Social Worker

## 2018-01-04 VITALS — BP 139/85 | HR 86 | Temp 98.4°F | Resp 16 | Wt 166.8 lb

## 2018-01-04 DIAGNOSIS — E1139 Type 2 diabetes mellitus with other diabetic ophthalmic complication: Secondary | ICD-10-CM

## 2018-01-04 DIAGNOSIS — Z8249 Family history of ischemic heart disease and other diseases of the circulatory system: Secondary | ICD-10-CM | POA: Insufficient documentation

## 2018-01-04 DIAGNOSIS — IMO0002 Reserved for concepts with insufficient information to code with codable children: Secondary | ICD-10-CM

## 2018-01-04 DIAGNOSIS — E1122 Type 2 diabetes mellitus with diabetic chronic kidney disease: Secondary | ICD-10-CM | POA: Insufficient documentation

## 2018-01-04 DIAGNOSIS — R1110 Vomiting, unspecified: Secondary | ICD-10-CM | POA: Insufficient documentation

## 2018-01-04 DIAGNOSIS — Z818 Family history of other mental and behavioral disorders: Secondary | ICD-10-CM | POA: Insufficient documentation

## 2018-01-04 DIAGNOSIS — F331 Major depressive disorder, recurrent, moderate: Secondary | ICD-10-CM

## 2018-01-04 DIAGNOSIS — E1169 Type 2 diabetes mellitus with other specified complication: Secondary | ICD-10-CM | POA: Insufficient documentation

## 2018-01-04 DIAGNOSIS — I129 Hypertensive chronic kidney disease with stage 1 through stage 4 chronic kidney disease, or unspecified chronic kidney disease: Secondary | ICD-10-CM | POA: Insufficient documentation

## 2018-01-04 DIAGNOSIS — Z881 Allergy status to other antibiotic agents status: Secondary | ICD-10-CM | POA: Insufficient documentation

## 2018-01-04 DIAGNOSIS — Z598 Other problems related to housing and economic circumstances: Secondary | ICD-10-CM

## 2018-01-04 DIAGNOSIS — F419 Anxiety disorder, unspecified: Secondary | ICD-10-CM

## 2018-01-04 DIAGNOSIS — N184 Chronic kidney disease, stage 4 (severe): Secondary | ICD-10-CM | POA: Insufficient documentation

## 2018-01-04 DIAGNOSIS — E1165 Type 2 diabetes mellitus with hyperglycemia: Secondary | ICD-10-CM | POA: Insufficient documentation

## 2018-01-04 DIAGNOSIS — Z79899 Other long term (current) drug therapy: Secondary | ICD-10-CM | POA: Insufficient documentation

## 2018-01-04 DIAGNOSIS — Z794 Long term (current) use of insulin: Secondary | ICD-10-CM | POA: Insufficient documentation

## 2018-01-04 DIAGNOSIS — R252 Cramp and spasm: Secondary | ICD-10-CM | POA: Insufficient documentation

## 2018-01-04 DIAGNOSIS — Z599 Problem related to housing and economic circumstances, unspecified: Secondary | ICD-10-CM

## 2018-01-04 DIAGNOSIS — F332 Major depressive disorder, recurrent severe without psychotic features: Secondary | ICD-10-CM | POA: Insufficient documentation

## 2018-01-04 DIAGNOSIS — Z90710 Acquired absence of both cervix and uterus: Secondary | ICD-10-CM | POA: Insufficient documentation

## 2018-01-04 DIAGNOSIS — Z811 Family history of alcohol abuse and dependence: Secondary | ICD-10-CM | POA: Insufficient documentation

## 2018-01-04 DIAGNOSIS — E785 Hyperlipidemia, unspecified: Secondary | ICD-10-CM | POA: Insufficient documentation

## 2018-01-04 DIAGNOSIS — Z833 Family history of diabetes mellitus: Secondary | ICD-10-CM | POA: Insufficient documentation

## 2018-01-04 DIAGNOSIS — G4700 Insomnia, unspecified: Secondary | ICD-10-CM | POA: Insufficient documentation

## 2018-01-04 DIAGNOSIS — E1121 Type 2 diabetes mellitus with diabetic nephropathy: Secondary | ICD-10-CM

## 2018-01-04 LAB — POCT GLYCOSYLATED HEMOGLOBIN (HGB A1C): HbA1c, POC (controlled diabetic range): 10.3 % — AB (ref 0.0–7.0)

## 2018-01-04 LAB — GLUCOSE, POCT (MANUAL RESULT ENTRY): POC GLUCOSE: 225 mg/dL — AB (ref 70–99)

## 2018-01-04 MED ORDER — METOCLOPRAMIDE HCL 5 MG PO TABS: 5.0000 mg | ORAL_TABLET | Freq: Three times a day (TID) | ORAL | 1 refills | Status: DC

## 2018-01-04 MED ORDER — TRUE METRIX METER W/DEVICE KIT: 1.0000 | PACK | 0 refills | Status: DC | PRN

## 2018-01-04 MED ORDER — INSULIN ASPART PROT & ASPART (70-30 MIX) 100 UNIT/ML PEN: 44.0000 [IU] | PEN_INJECTOR | Freq: Two times a day (BID) | SUBCUTANEOUS | 2 refills | Status: DC

## 2018-01-04 MED ORDER — ATORVASTATIN CALCIUM 20 MG PO TABS: 20.0000 mg | ORAL_TABLET | Freq: Every day | ORAL | 6 refills | Status: DC

## 2018-01-04 MED FILL — ?ATORVASTATIN 20 MG TABLET: 20 | 30 days supply | Qty: 30 | Fill #0

## 2018-01-04 MED FILL — METOCLOPRAMIDE 5 MG TABLET: 5 | 30 days supply | Qty: 90 | Fill #0

## 2018-01-04 NOTE — Progress Notes (Signed)
Pt is wanting to discuss changing some medications -Flexeril   Pt states she has been vomiting and being nausea since last week

## 2018-01-04 NOTE — Patient Instructions (Signed)
Stop Victoza. Increase insulin to 44 units twice a day with meals. Instead of trying to eat 3 big meals a day, try to eat smaller more frequent meals like 5 small meals a day. Take Reglan 5 mg 3 times a day with meals.  I have referred you to the nutritionist.  Decrease Lipitor to 20 mg daily to see if it would help decrease the cramps.

## 2018-01-04 NOTE — Progress Notes (Signed)
Patient ID: Ann Bryan, female    DOB: 06/11/1960  MRN: [id number]  CC: Diabetes; Hypertension; Medication Management; and Referral (Dietician )   Subjective: Ann Bryan is a 57 y.o. female who presents for  2 mth f/u for chronic ds management. Her concerns today include:  Patient with history of DM, HTN, HL, seasonal allergies,anxiety/depression, glucoma,nephrotic range proteinuriaand insomnia   DM: not able to check BS consistently; only had 6 stripes remaining since last visit.  Unable to afford the stripes.  She also feels she may need a new meter as a lot of time she gets "error" reading.  Still has intermittent nausea.  Worse over past 4 days.  Associated with diarrhea for first 2 days. Took Reglan a few times but not daily since our discussion on last visit that long-term use can sometimes cause movement disorder.  She also had gastric emptying study which was normal but did show unusually rapid emptying of solid material from the stomach. -She skips insulin doses whenever she has the nausea/vomiting.  These episodes are not associated with any abdominal pains. -She feels the Novolog 70/30 works when she takes it [employer] low BS episodes when she has nausea and is unable to eat.  Reports severe low BS episode 2 wks ago. Did not have stripe to check BS but felt it was low.  She normally drinks juice and eat a piece of candy to get her blood sugars back up. BS up today because she drank juice, and ate a piece candy and sandwich just before coming in.   Request to see a nutritionist.  Depression: History of depression with a PHQ score of 23 today.  "I'm extremely down right now cause I don't have a job."  She is also worried about her health.  Tried to contact [facility] again about trying to get in with the nephrologist but she has not been able to get anyone on the phone.  "Overall I just do not feel good."  She is not interested in seeing a therapist or being placed on  medication for depression.  "Those things do not help."  She is requesting something other than Flexeril because she continues to get painful spasms in her hands, upper arms and sometimes the legs.  Reports one episode the spasm was so bad that it separated the fourth and fifth fingers from the first 3 on the left hand.  Patient Active Problem List   Diagnosis Date Noted  . Nausea and vomiting 09/28/2017  . Insomnia 06/05/2017  . CKD (chronic kidney disease) stage 4, GFR 15-29 ml/min (HCC) 06/05/2017  . Chronic seasonal allergic rhinitis 12/10/2015  . Arthralgia of both hands 12/10/2015  . Severe episode of recurrent major depressive disorder, without psychotic features ([place]) 12/10/2015  . NEPHROPATHY, DIABETIC 09/23/2006  . Early onset dysthymia 12/23/2005  . Essential hypertension 12/23/2005  . DM (diabetes mellitus) type II uncontrolled with eye manifestation ([place]) 01/07/1984     Current Outpatient Medications on File Prior to Visit  Medication Sig Dispense Refill  . amLODipine (NORVASC) 10 MG tablet Take 1 tablet (10 mg total) by mouth daily. 90 tablet 3  . carvedilol (COREG) 25 MG tablet Take 1.5 tablets (37.5 mg total) by mouth 2 (two) times daily with a meal. 90 tablet 6  . cetirizine (ZYRTEC) 10 MG tablet Take 1 tablet (10 mg total) by mouth daily. 30 tablet 2  . cyclobenzaprine (FLEXERIL) 10 MG tablet Take 1 tablet (10 mg total) by  mouth daily as needed for muscle spasms. 30 tablet 2  . glucose blood (TRUE METRIX BLOOD GLUCOSE TEST) test strip 1 each by Other route 3 (three) times daily. 100 each 11  . glucose blood (TRUE METRIX BLOOD GLUCOSE TEST) test strip Use as instructed 100 each 12  . Insulin Pen Needle (PEN NEEDLES) 31G X 6 MM MISC Use as directed 100 each 6  . Insulin Syringe-Needle U-100 (INSULIN SYRINGE 1CC/31GX5/16") 31G X 5/16" 1 ML MISC Use as directed.  BD 51m x 31G needles 100 each 6  . Insulin Syringes, Disposable, U-100 0.5 ML MISC 1 each by Does not apply route  2 (two) times daily. 100 each 8  . lisinopril-hydrochlorothiazide (ZESTORETIC) 20-12.5 MG tablet Take 1 tablet by mouth daily. 90 tablet 6  . timolol (BETIMOL) 0.5 % ophthalmic solution Place 1 drop into both eyes daily. 10 mL 3  . TRUEPLUS LANCETS 28G MISC 1 each by Does not apply route 3 (three) times daily. 100 each 12  . zolpidem (AMBIEN) 5 MG tablet Take 1 tablet (5 mg total) by mouth at bedtime as needed. Each prescription to last a month. 12 tablet 1   No current facility-administered medications on file prior to visit.     Allergies  Allergen Reactions  . Doxycycline     REACTION: N/V    Social History   Socioeconomic History  . Marital status: Single    Spouse name: Not on file  . Number of children: Not on file  . Years of education: Not on file  . Highest education level: Not on file  Occupational History  . Not on file  Social Needs  . Financial resource strain: Not on file  . Food insecurity:    Worry: Not on file    Inability: Not on file  . Transportation needs:    Medical: Not on file    Non-medical: Not on file  Tobacco Use  . Smoking status: Never Smoker  . Smokeless tobacco: Never Used  Substance and Sexual Activity  . Alcohol use: No  . Drug use: No  . Sexual activity: Not Currently  Lifestyle  . Physical activity:    Days per week: Not on file    Minutes per session: Not on file  . Stress: Not on file  Relationships  . Social connections:    Talks on phone: Not on file    Gets together: Not on file    Attends religious service: Not on file    Active member of club or organization: Not on file    Attends meetings of clubs or organizations: Not on file    Relationship status: Not on file  . Intimate partner violence:    Fear of current or ex partner: Not on file    Emotionally abused: Not on file    Physically abused: Not on file    Forced sexual activity: Not on file  Other Topics Concern  . Not on file  Social History Narrative  . Not  on file    Family History  Problem Relation Age of Onset  . Diabetes Mother   . Stroke Mother   . Hypertension Mother   . Alcohol abuse Mother   . Depression Father        Committed suicide  . Alcohol abuse Maternal Uncle     Past Surgical History:  Procedure Laterality Date  . ABDOMINAL HYSTERECTOMY   2004    ROS: Review of Systems Negative except as above.  PHYSICAL EXAM: BP 139/85   Pulse 86   Temp 98.4 F (36.9 C) (Oral)   Resp 16   Wt 166 lb 12.8 oz (75.7 kg)   LMP 03/13/2002   SpO2 95%   BMI 32.58 kg/m   Wt Readings from Last 3 Encounters:  01/04/18 166 lb 12.8 oz (75.7 kg)  11/02/17 173 lb 12.8 oz (78.8 kg)  09/29/17 177 lb 7.5 oz (80.5 kg)    Physical Exam  General appearance - alert, well appearing, and in no distress Mental status - normal mood, behavior, speech, dress, motor activity, and thought processes Mouth -oral mucosa is moist Chest - clear to auscultation, no wheezes, rales or rhonchi, symmetric air entry Heart - normal rate, regular rhythm, normal S1, S2, no murmurs, rubs, clicks or gallops Abdomen - soft, nontender, nondistended, no masses or organomegaly Extremities -no LE edema  Depression screen [facility] 2/9 01/04/2018 11/02/2017 06/04/2017  Decreased Interest _0 Down, Depressed, Hopeless _1 PHQ - 2 Score _2 Altered sleeping 2 2 -  Tired, decreased energy _3 Change in appetite _4 Feeling bad or failure about yourself  _5 Trouble concentrating 2 0 0  Moving slowly or fidgety/restless 2 0 0  Suicidal thoughts _6 PHQ-9 Score 23 14 -  Some recent data might be hidden    Results for orders placed or performed in visit on 01/04/18  POCT glucose (manual entry)  Result Value Ref Range   POC Glucose 225 (A) 70 - 99 mg/dl  POCT glycosylated hemoglobin (Hb A1C)  Result Value Ref Range   Hemoglobin A1C     HbA1c POC (<> result, manual entry)     HbA1c, POC (prediabetic range)     HbA1c, POC (controlled diabetic  range) 10.3 (A) 0.0 - 7.0 %    ASSESSMENT AND PLAN: 1. Uncontrolled type 2 diabetes mellitus with nephropathy ([place]) Given her recurrent episodes of nausea and vomiting, I recommend that we stop the Victoza to see whether this may be contributing.  Encourage smaller more frequent meals rather than trying to get 3 large meals a day. Increase insulin instead from 40 units to 44 units twice a day.   Advised to cut the dose of insulin in half on on days when oral intake is poor We will also refer her to an endocrinologist - POCT glucose (manual entry) - POCT glycosylated hemoglobin (Hb A1C) - Amb ref to Medical Nutrition Therapy-MNT - insulin aspart protamine - aspart (NOVOLOG MIX 70/30 FLEXPEN) (70-30) 100 UNIT/ML FlexPen; Inject 0.44 mLs (44 Units total) into the skin 2 (two) times daily with a meal.  Dispense: 15 mL; Refill: 2 - Blood Glucose Monitoring Suppl (TRUE METRIX METER) w/Device KIT; 1 each by Does not apply route as needed.  Dispense: 1 kit; Refill: 0  2. Recurrent vomiting Stop Victoza as mentioned above. We will have her take the Reglan every day but at 5 mg dose instead of 10.   If no improvement with these measures, will refer to a gastroenterologist - metoCLOPramide (REGLAN) 5 MG tablet; Take 1 tablet (5 mg total) by mouth 3 (three) times daily before meals.  Dispense: 90 tablet; Refill: 1  3. Severe episode of recurrent major depressive disorder, without psychotic features [facility]) Patient declines medication or therapy.  Financial situation and current medical diagnoses are contributing factors.  She was seen by our clinical social worker today.  4.  Muscle cramps We will have her cut the dose of Lipitor in half from 40 mg daily to 20 mg daily to see if the cramps decreased  5. Hyperlipidemia associated with type 2 diabetes mellitus (HCC) - atorvastatin (LIPITOR) 20 MG tablet; Take 1 tablet (20 mg total) by mouth daily.  Dispense: 30 tablet; Refill: 6  6.  CKD Will try to  get her in at [facility] since no success with [facility]. Patient was given the opportunity to ask questions.  Patient verbalized understanding of the plan and was able to repeat key elements of the plan.   Orders Placed This Encounter  Procedures  . Amb ref to Medical Nutrition Therapy-MNT  . Ambulatory referral to Endocrinology  . Ambulatory referral to Nephrology  . POCT glucose (manual entry)  . POCT glycosylated hemoglobin (Hb A1C)     Requested Prescriptions   Signed Prescriptions Disp Refills  . atorvastatin (LIPITOR) 20 MG tablet 30 tablet 6    Sig: Take 1 tablet (20 mg total) by mouth daily.  . metoCLOPramide (REGLAN) 5 MG tablet 90 tablet 1    Sig: Take 1 tablet (5 mg total) by mouth 3 (three) times daily before meals.  . insulin aspart protamine - aspart (NOVOLOG MIX 70/30 FLEXPEN) (70-30) 100 UNIT/ML FlexPen 15 mL 2    Sig: Inject 0.44 mLs (44 Units total) into the skin 2 (two) times daily with a meal.  . Blood Glucose Monitoring Suppl (TRUE METRIX METER) w/Device KIT 1 kit 0    Sig: 1 each by Does not apply route as needed.    Return in about 5 weeks (around 02/08/2018).  Karle Plumber, MD, FACP

## 2018-01-04 NOTE — BH Specialist Note (Signed)
Integrated Behavioral Health Initial Visit  MRN: [id number] Name: Ann Bryan  Number of [place] Clinician visits:: 1/6 Session Start time: 11:00 AM  Session End time: 11:30 AM Total time: 30 minutes  Type of Service: [place] Interpretor:No. Interpretor Name and Language: N.A   Warm Hand Off Completed.       SUBJECTIVE: Ann Bryan is a 57 y.o. female accompanied by self Patient was referred by Dr. Wynetta Emery for depression and anxiety. Patient reports the following symptoms/concerns: Pt shared that she has ongoing medical conditions and is experiencing financial strain Duration of problem: Ongoing; Severity of problem: severe  OBJECTIVE: Mood: Anxious and Depressed and Affect: Appropriate Risk of harm to self or others: No plan to harm self or others Pt scored positive on phq9; however, denies current SI/HI/AVH. Protective factors identified, safety plan discussed, and crisis intervention resources were provided.   LIFE CONTEXT: Family and Social: Pt receives limited support. Has a niece in [place], [place] School/Work: Pt has the [employer] card and [employer] 100% She is currently employed part-time; however, looking for work to provide more hours Self-Care: Pt states that she is a Engineer, manufacturing and she prays to cope with stressors.  Life Changes: Pt shared that she has ongoing medical conditions and is experiencing financial strain  GOALS ADDRESSED: Patient will: 1. Reduce symptoms of: anxiety, depression and stress 2. Increase knowledge and/or ability of: coping skills and healthy habits  3. Demonstrate ability to: Increase healthy adjustment to current life circumstances and Increase adequate support systems for patient/family  INTERVENTIONS: Interventions utilized: Solution-Focused Strategies, Supportive Counseling, Psychoeducation and/or Health Education and Link to [employer]  Standardized Assessments completed:  GAD-7 and PHQ 2&9 with C-SSRS  ASSESSMENT: Patient currently experiencing depression and anxiety triggered by difficulty managing ongoing medical conditions and financial strain. She receives limited support and repots feeling "tired" due to stressors. Pt scored positive on phq9; however, denies current SI/HI/AVH. Protective factors identified, safety plan discussed, and crisis intervention resources were provided.    Patient may benefit from psychoeducation, psychotherapy, and medication management. LCSWA educated pt on the correlation between one's mental and physical health, in addition, to how stress can negatively impact both. Therapeutic interventions were discussed. Pt is not open to psychotherapy or medication management at this time. Supportive resources on obtaining employment were provided. Pt utilizes [employer] for transportation.  PLAN: 1. Follow up with behavioral health clinician on : Pt was encouraged to contact LCSWA if symptoms worsen or fail to improve to schedule behavioral appointments at [facility]. 2. Behavioral recommendations: LCSWA recommends that pt apply healthy coping skills discussed and utilize provided resources. Pt is encouraged to schedule follow up appointment with LCSWA 3. Referral(s): [place] (In Clinic) and [employer] Resources:  Finances 4. "From scale of 1-10, how likely are you to follow plan?":   Rebekah Chesterfield, LCSW 01/05/18 12:03 PM

## 2018-01-14 MED FILL — CYCLOBENZAPRINE 10 MG TAB: 10 | 30 days supply | Qty: 30 | Fill #1

## 2018-01-14 MED FILL — !TRUE METRIX BLOOD GLUCOSE: 30 days supply | Qty: 1 | Fill #0

## 2018-01-22 ENCOUNTER — Encounter: Payer: Self-pay | Admitting: Registered"

## 2018-01-22 ENCOUNTER — Encounter: Payer: Self-pay | Attending: Internal Medicine | Admitting: Registered"

## 2018-01-22 DIAGNOSIS — E1139 Type 2 diabetes mellitus with other diabetic ophthalmic complication: Secondary | ICD-10-CM | POA: Insufficient documentation

## 2018-01-22 DIAGNOSIS — E1165 Type 2 diabetes mellitus with hyperglycemia: Secondary | ICD-10-CM | POA: Insufficient documentation

## 2018-01-22 DIAGNOSIS — IMO0002 Reserved for concepts with insufficient information to code with codable children: Secondary | ICD-10-CM

## 2018-01-22 NOTE — Patient Instructions (Addendum)
Remember to stop at [facility] to pick up little green and blue books. Consider setting your alarm to remind you to eat in the afternoon Aim to eat balanced meals and snacks Can use the Shopping at the [employer] for menu ideas Consider eating nuts when eating fruit When eating salad, have the dressing on the side and put your fork in the dressing first and then get a bit of salad. Consider getting some small potatoes to cook at home and include some meat with that meal. Be careful not to eat too much popcorn at one time.

## 2018-01-22 NOTE — Progress Notes (Signed)
Diabetes Self-Management Education  Visit Type: First/Initial  Appt. Start Time: 1050 Appt. End Time: 1200  01/22/2018  Ann Bryan, identified by name and date of birth, is a 58 y.o. female with a diagnosis of Diabetes: Type 2.   ASSESSMENT Pt read appointment policy and aware of 10 min policy and when her [employer] transportation was running late called in and was very grateful that we were able to see her past the 10 min policy.  Pt states she would like to learn how to manage her diet better within the limitations of what she can afford and her lifestyle.  Pt states she had been really sick with vomitting and was also told she has CKD Stage 4. Per chart A1c was 8.7% 9/24, 3 months later 10.3%  Pt states [place], used to live off tomato sandwiches and watermellon out of the garden. Pt states she has started drinking almond milk due to lactose intolerance and reports she prefers the taste to cow's milk. Pt states she eats in the morning to take insulin unless her number is high and then usually doesn't eat until supper and describes herself as a heavy eater, likes potatoes and bread. Pt states she has insomonia and sometimes snacks during the night, popcorn usually. Pt states for last 5-6 months no soda.  Pt states she is looking for work and has only $8. Pt states she quit job because they would not giver her enough hours. Pt states she usually has enough food stamps to get her through most of the month.   Pt states she does not have access to Internet at home, reports sometimes soon the phone that social services provided will also have internet access.  Diabetes Self-Management Education - 01/22/18 1104      Visit Information   Visit Type  First/Initial      Initial Visit   Diabetes Type  Type 2    Are you currently following a meal plan?  No    Are you taking your medications as prescribed?  Yes    Date Diagnosed  1980s      Health Coping   How would you rate your  overall health?  Poor      Psychosocial Assessment   Patient Belief/Attitude about Diabetes  Defeat/Burnout    What is the last grade level you completed in school?  2 yrs college      Complications   Last HgB A1C per patient/outside source  10.3 %    Have you had a dilated eye exam in the past 12 months?  No    Have you had a dental exam in the past 12 months?  No    Are you checking your feet?  No      Dietary Intake   Breakfast  [place] sausage banquet, liquid eggs, toast, grits    Snack (morning)  none    Lunch  none    Snack (afternoon)  none    Dinner  Potatoes, cheese, bacon, Friday's chicken wings    Snack (evening)  popcorn, banana    Beverage(s)  water, sparkling water      Exercise   Exercise Type  Light (walking / raking leaves)    How many days per week to you exercise?  2    How many minutes per day do you exercise?  20    Total minutes per week of exercise  40      Patient Education  Previous Diabetes Education  Yes (please comment)   1980s   Nutrition management   Food label reading, portion sizes and measuring food.;Role of diet in the treatment of diabetes and the relationship between the three main macronutrients and blood glucose level    Psychosocial adjustment  Worked with patient to identify barriers to care and solutions   financial barriers     Individualized Goals (developed by patient)   Nutrition  General guidelines for healthy choices and portions discussed      Outcomes   Expected Outcomes  Demonstrated interest in learning. Expect positive outcomes    Future DMSE  4-6 wks    Program Status  Not Completed       Individualized Plan for Diabetes Self-Management Training:   Learning Objective:  Patient will have a greater understanding of diabetes self-management. Patient education plan is to attend individual and/or group sessions per assessed needs and concerns.  Patient Instructions  Remember to stop at [facility] to  pick up little green and blue books. Consider setting your alarm to remind you to eat in the afternoon Aim to eat balanced meals and snacks Can use the Shopping at the [employer] for menu ideas Consider eating nuts when eating fruit When eating salad, have the dressing on the side and put your fork in the dressing first and then get a bit of salad. Consider getting some small potatoes to cook at home and include some meat with that meal. Be careful not to eat too much popcorn at one time.     Expected Outcomes:  Demonstrated interest in learning. Expect positive outcomes  Education material provided: Librarian, academic double snap benefit, [employer], [employer], Shopping at [employer]  If problems or questions, patient to contact team via:  Phone  Future DSME appointment: 4-6 wks

## 2018-01-25 ENCOUNTER — Other Ambulatory Visit: Payer: Self-pay | Admitting: Internal Medicine

## 2018-01-25 MED FILL — TRUE METRIX TEST STRIP: 25 days supply | Qty: 100 | Fill #0

## 2018-02-01 ENCOUNTER — Ambulatory Visit (INDEPENDENT_AMBULATORY_CARE_PROVIDER_SITE_OTHER): Payer: Self-pay | Admitting: Endocrinology

## 2018-02-01 ENCOUNTER — Other Ambulatory Visit: Payer: Self-pay

## 2018-02-01 ENCOUNTER — Encounter: Payer: Self-pay | Admitting: Endocrinology

## 2018-02-01 VITALS — BP 190/84 | HR 70 | Ht 60.0 in | Wt 176.2 lb

## 2018-02-01 DIAGNOSIS — E1121 Type 2 diabetes mellitus with diabetic nephropathy: Secondary | ICD-10-CM

## 2018-02-01 DIAGNOSIS — E1165 Type 2 diabetes mellitus with hyperglycemia: Secondary | ICD-10-CM

## 2018-02-01 DIAGNOSIS — IMO0002 Reserved for concepts with insufficient information to code with codable children: Secondary | ICD-10-CM

## 2018-02-01 DIAGNOSIS — I10 Essential (primary) hypertension: Secondary | ICD-10-CM

## 2018-02-01 LAB — GLUCOSE, POCT (MANUAL RESULT ENTRY): POC GLUCOSE: 126 mg/dL — AB (ref 70–99)

## 2018-02-01 MED ORDER — CLONIDINE 0.1 MG/24HR TD PTWK: 0.1000 mg | MEDICATED_PATCH | TRANSDERMAL | 0 refills | Status: DC

## 2018-02-01 MED ORDER — INSULIN ASPART 100 UNIT/ML FLEXPEN: 10.0000 [IU] | PEN_INJECTOR | Freq: Three times a day (TID) | SUBCUTANEOUS | 3 refills | Status: DC

## 2018-02-01 MED ORDER — INSULIN GLARGINE 100 UNIT/ML SOLOSTAR PEN: 50.0000 [IU] | PEN_INJECTOR | Freq: Every day | SUBCUTANEOUS | 3 refills | Status: DC

## 2018-02-01 MED FILL — !LANTUS SOLOSTAR 100UNITS/M: 100 | 30 days supply | Qty: 15 | Fill #0

## 2018-02-01 MED FILL — cloNIDine 0.1 MG/24HR PTWK: 0.1 | 28 days supply | Qty: 4 | Fill #0

## 2018-02-01 MED FILL — !NOVOLOG FLEXPEN SYRINGE 1: 100/ML | 28 days supply | Qty: 12 | Fill #0

## 2018-02-01 NOTE — Patient Instructions (Addendum)
Novolog 10-12 at [facility]; take 12-14 at supper   Lantus insulin: This insulin provides blood sugar control for up to 24 hours.    Start with 50 units at dinner daily and increase by 2 units every 3 days until the waking up sugars are under 130. Then continue the same dose.  If blood sugar is under 90 for 2 days in a row, reduce the dose by 2 units.  Note that this insulin does not control the rise of blood sugar with meals    Check blood sugars on waking up 7 days a week  Also check blood sugars about 2 hours after meals and do this after different meals by rotation  Recommended blood sugar levels on waking up are 90-130 and about 2 hours after meal is 130-180  Please bring your blood sugar monitor to each visit, thank you

## 2018-02-01 NOTE — Progress Notes (Signed)
Patient ID: Ann Bryan, female   DOB: 1960-01-12, 58 y.o.   MRN: [id number]           Reason for Appointment: Consultation for Type 2 Diabetes  Referring physician: Karle Plumber   History of Present Illness:          Date of diagnosis of type 2 diabetes mellitus: 1980s       Background history:  She was initially treated with metformin and probably 10 years later insulin was added She has also tried [place] and Victoza Victoza was continued since 2017 until 12/2017 she has had consistently high A1c levels She has been on various insulin regimens, at one time was on Lantus alone which did not control her sugar  Recent history:   Most recent A1c is 10.3 done on 01/04/2018  INSULIN regimen is: Humulin 70/30 44 units Acs bid        Non-insulin hypoglycemic drugs the patient is taking are: none  Current management, blood sugar patterns and problems identified:  Because of persistent and significant nausea and vomiting Victoza was stopped in 12/19  She has been taking the same insulin dose for quite some time  Because of financial issues she has not checked her sugars very much and currently has not picked up her test strips  She thinks her blood sugars have been mostly high but variable  Her sugars may be lower when she is more active and walking and occasionally may get low sugars in the 50s  She may tend to have low blood sugars either late morning or occasionally late evening   Her mealtimes and meal content are variable although recently with less nausea she is eating better  Previously had lost about 8 pounds from nausea and has gained back 10 pounds recently        Side effects from medications have been: Possible vomiting from Victoza   Symptoms of low blood sugars: Feels shaky dizzy    Meal times are: irregular                 Exercise: walking on most days, currently does not have a car  Glucose monitoring:  done irregularly, usually less than 1 times a  day         Glucometer: True Metrix.       Blood Glucose readings not available  Dietician visit, most recent: 01/2018  Weight history:  Wt Readings from Last 3 Encounters:  02/01/18 176 lb 3.2 oz (79.9 kg)  01/04/18 166 lb 12.8 oz (75.7 kg)  11/02/17 173 lb 12.8 oz (78.8 kg)    Glycemic control:   Lab Results  Component Value Date   HGBA1C 10.3 (A) 01/04/2018   HGBA1C 8.7 (H) 09/29/2017   HGBA1C 9.3 (A) 06/04/2017   Lab Results  Component Value Date   MICROALBUR 58.6 (H) 10/20/2014   LDLCALC 133 (H) 01/22/2017   CREATININE 3.68 (H) 11/02/2017   Lab Results  Component Value Date   MICRALBCREAT 1,483.5 (H) 10/20/2014    No results found for: FRUCTOSAMINE  Office Visit on 02/01/2018  Component Date Value Ref Range Status  . POC Glucose 02/01/2018 126* 70 - 99 mg/dl Final    Allergies as of 02/01/2018      Reactions   Doxycycline    REACTION: N/V      Medication List       Accurate as of February 01, 2018  1:01 PM. Always use your most recent med list.  amLODipine 10 MG tablet Commonly known as:  NORVASC Take 1 tablet (10 mg total) by mouth daily.   atorvastatin 20 MG tablet Commonly known as:  LIPITOR Take 1 tablet (20 mg total) by mouth daily.   carvedilol 25 MG tablet Commonly known as:  COREG Take 1.5 tablets (37.5 mg total) by mouth 2 (two) times daily with a meal.   cetirizine 10 MG tablet Commonly known as:  ZYRTEC Take 1 tablet (10 mg total) by mouth daily.   cloNIDine 0.1 mg/24hr patch Commonly known as:  CATAPRES - Dosed in mg/24 hr Place 1 patch (0.1 mg total) onto the skin once a week.   cyclobenzaprine 10 MG tablet Commonly known as:  FLEXERIL Take 1 tablet (10 mg total) by mouth daily as needed for muscle spasms.   glucose blood test strip Commonly known as:  TRUE METRIX BLOOD GLUCOSE TEST 1 each by Other route 3 (three) times daily.   glucose blood test strip Use as instructed   insulin aspart 100 UNIT/ML  FlexPen Commonly known as:  NOVOLOG FLEXPEN Inject 10-14 Units into the skin 3 (three) times daily with meals. INJECT 10-14 UNITS UNDER THE SKIN 3 TIMES DAILY WITH MEALS.   Insulin Glargine 100 UNIT/ML Solostar Pen Commonly known as:  LANTUS SOLOSTAR Inject 50 Units into the skin daily. INJECT 50 UNITS UNDER THE SKIN ONCE DAILY.   INSULIN SYRINGE 1CC/31GX5/16" 31G X 5/16" 1 ML Misc Use as directed.  BD 23m x 31G needles   Insulin Syringes (Disposable) U-100 0.5 ML Misc 1 each by Does not apply route 2 (two) times daily.   lisinopril-hydrochlorothiazide 20-12.5 MG tablet Commonly known as:  ZESTORETIC Take 1 tablet by mouth daily.   metoCLOPramide 5 MG tablet Commonly known as:  REGLAN Take 1 tablet (5 mg total) by mouth 3 (three) times daily before meals.   Pen Needles 31G X 6 MM Misc Use as directed   TRUE METRIX METER w/Device Kit 1 each by Does not apply route as needed.   TRUEPLUS LANCETS 28G Misc 1 each by Does not apply route 3 (three) times daily.   zolpidem 5 MG tablet Commonly known as:  AMBIEN Take 1 tablet (5 mg total) by mouth at bedtime as needed. Each prescription to last a month.       Allergies:  Allergies  Allergen Reactions  . Doxycycline     REACTION: N/V    Past Medical History:  Diagnosis Date  . Cataract    right eye  . Depression   . Diabetes mellitus 1986    poorly controlled last hemoglobin A1c  10.5 on January 01, 2010, increase since June 2011 when hemoglobin A1c was 9.3 , determined to be most likely secondary to non-compliance.  . [place]   . Diabetic retinopathy   . Glaucoma   . Hyperlipidemia   . Hypertension   . Nausea and vomiting   . Retinopathy     Past Surgical History:  Procedure Laterality Date  . ABDOMINAL HYSTERECTOMY   2004    Family History  Problem Relation Age of Onset  . Diabetes Mother   . Stroke Mother   . Hypertension Mother   . Alcohol abuse Mother   . Depression Father         Committed suicide  . Alcohol abuse Maternal Uncle     Social History:  reports that she has never smoked. She has never used smokeless tobacco. She reports that she does not drink alcohol  or use drugs.   Review of Systems  Constitutional: Positive for weight gain. Negative for reduced appetite.  HENT: Negative for headaches.   Eyes: Negative for blurred vision.       She has been told to have cataracts  Respiratory: Negative for shortness of breath.   Cardiovascular: Negative for leg swelling.  Gastrointestinal: Positive for nausea.       Mostly on waking up.  Nausea is less with stopping Victoza but still present at times.  She has been afraid to take Reglan that was prescribed because of fear of side effects  Endocrine: Negative for fatigue.  Musculoskeletal: Positive for joint pain and muscle cramps.       Occasional spasms in hands or feet treated by PCP with Flexeril  Allergic/Immunologic:       Symptoms of allergic rhinitis periodically  Neurological: Negative for numbness.   Lipid history: She has been told to take only half Lipitor because of muscle spasms    Lab Results  Component Value Date   CHOL 206 (H) 01/22/2017   HDL 55 01/22/2017   LDLCALC 133 (H) 01/22/2017   TRIG 89 01/22/2017   CHOLHDL 3.7 01/22/2017           Hypertension: Has been treated by PCP, currently on lisinopril HCT, carvedilol and amlodipine  BP Readings from Last 3 Encounters:  02/01/18 (!) 190/84  01/04/18 139/85  11/10/17 132/80    Most recent eye exam was in 2012  Most recent foot exam: 01/2018  Currently known complications of diabetes:  LABS:  Office Visit on 02/01/2018  Component Date Value Ref Range Status  . POC Glucose 02/01/2018 126* 70 - 99 mg/dl Final    Physical Examination:  BP (!) 190/84 (Cuff Size: Small)   Pulse 70   Ht 5' (1.524 m)   Wt 176 lb 3.2 oz (79.9 kg)   LMP 03/13/2002   SpO2 98%   BMI 34.41 kg/m   GENERAL:         Patient has generalized  obesity.    HEENT:         Eye exam shows normal external appearance.  Fundus exam shows no retinopathy.  Oral exam shows normal mucosa .   NECK:   There is no lymphadenopathy  Thyroid is not enlarged and no nodules felt.   Carotids are normal to palpation and no bruit heard  LUNGS:         Chest is symmetrical. Lungs are clear to auscultation.Marland Kitchen   HEART:         Heart sounds:  S1 and S2 are normal. No murmur or click heard., no S3 or S4.   ABDOMEN:   There is no distention present. Liver and spleen are not palpable.  No other mass or tenderness present.    NEUROLOGICAL:   Ankle jerks are absent bilaterally.    Diabetic Foot Exam - Simple   Simple Foot Form Diabetic Foot exam was performed with the following findings:  Yes   Visual Inspection No deformities, no ulcerations, no other skin breakdown bilaterally:  Yes Sensation Testing Intact to touch and monofilament testing bilaterally:  Yes Pulse Check Posterior Tibialis and Dorsalis pulse intact bilaterally:  Yes Comments            Vibration sense is nearly normal in distal first toes.  MUSCULOSKELETAL:  There is no swelling or deformity of the peripheral joints.     EXTREMITIES:     There is no ankle edema.  SKIN:    She has pigmentation of acanthosis of her neck.    No rash or dry skin      ASSESSMENT:  Diabetes type 2 on insulin, persistently poorly controlled  See history of present illness for detailed discussion of current diabetes management, blood sugar patterns and problems identified  Recent A1c indicates significant hyperglycemia and worsening control  Current treatment regimen is only with premixed insulin twice a day, taking a total of 88 units a day Difficult to know what her blood sugar patterns are since she does not monitor her blood sugar Appears to be having variable blood sugar levels which is partly related to her variable mealtimes and intake and also likely to be from the fixed dose of premixed  insulin  Complications of diabetes: Diabetic nephropathy, unknown status of retinopathy  Uncontrolled hypertension with significant chronic kidney disease. Waiting for consultation with the nephrologist Her blood pressure is much higher today than usual no apparent reason  History of hyperlipidemia  manage by PCP with last LDL well over 100  Longstanding history of nausea and vomiting unrelated to gastroparesis with previous normal gastric emptying study  PLAN:    1. Glucose monitoring: . Patient advised to get her blood sugar test strips and start checking readings daily with fasting or 2 hours after meals.  May need to do fasting readings daily to help adjust her insulin  2.  Diabetes education: . Patient will need meal planning and also general diabetes education through nurse educator  3.  Lifestyle changes: . She will need to cut back on high carbohydrate meals  4.  Medication changes needed: . Switch premixed insulin to basal bolus insulin regimen with separate basal and mealtime insulin . Discussed in detail the actions of both basal and bolus insulin . She will have much more flexibility with using mealtime insulin using NovoLog or Humalog whichever is available through her patient assistance program . Since Lantus is the preferred insulin that the patient is able to get through her pharmacy she will start with 50 units daily in the evening . She will titrate this by 4 units every 3 days until morning sugars are consistently below 130 . Mealtime insulin will be 10 to 12 units at breakfast and lunch and 12-14 at dinnertime based on blood sugar.  Need to keep her postprandial readings at least under 200 and preferably under 180 . She will review her blood sugar patterns and insulin regimen with nurse educator in 1 week  5.  Preventive care needed:  She needs eye exams regularly  For her HYPERTENSION we will add Catapres patch 0.1 mg weekly especially since she is on maximum  doses of carvedilol and amlodipine   Follow-up: 1 month    Patient Instructions  Novolog 10-12 at [facility]; take 12-14 at supper   Lantus insulin: This insulin provides blood sugar control for up to 24 hours.    Start with 50 units at dinner daily and increase by 2 units every 3 days until the waking up sugars are under 130. Then continue the same dose.  If blood sugar is under 90 for 2 days in a row, reduce the dose by 2 units.  Note that this insulin does not control the rise of blood sugar with meals    Check blood sugars on waking up 7 days a week  Also check blood sugars about 2 hours after meals and do this after different meals by rotation  Recommended blood sugar levels  on waking up are 90-130 and about 2 hours after meal is 130-180  Please bring your blood sugar monitor to each visit, thank you       Consultation note has been sent to the referring physician  Elayne Snare 02/01/2018, 1:01 PM   Note: This office note was prepared with Dragon voice recognition system technology. Any transcriptional errors that result from this process are unintentional.

## 2018-02-09 ENCOUNTER — Ambulatory Visit: Payer: Self-pay | Admitting: Internal Medicine

## 2018-02-19 MED FILL — AMLODIPINE BESYLATE 10 MG T: 10 | 30 days supply | Qty: 30 | Fill #2

## 2018-02-19 MED FILL — ?CARVEDILOL 25 MG TABLET: 25 | 30 days supply | Qty: 90 | Fill #2

## 2018-02-19 MED FILL — CYCLOBENZAPRINE 10 MG TAB: 10 | 30 days supply | Qty: 30 | Fill #2

## 2018-02-23 ENCOUNTER — Other Ambulatory Visit: Payer: Self-pay | Admitting: Endocrinology

## 2018-02-23 MED FILL — cloNIDine 0.1 MG/24HR PTWK: 0.1 | 28 days supply | Qty: 4 | Fill #0

## 2018-02-25 ENCOUNTER — Encounter: Payer: Self-pay | Attending: Internal Medicine | Admitting: Registered"

## 2018-02-25 DIAGNOSIS — IMO0002 Reserved for concepts with insufficient information to code with codable children: Secondary | ICD-10-CM

## 2018-02-25 DIAGNOSIS — E119 Type 2 diabetes mellitus without complications: Secondary | ICD-10-CM | POA: Insufficient documentation

## 2018-02-25 DIAGNOSIS — E1139 Type 2 diabetes mellitus with other diabetic ophthalmic complication: Secondary | ICD-10-CM | POA: Insufficient documentation

## 2018-02-25 DIAGNOSIS — E1165 Type 2 diabetes mellitus with hyperglycemia: Secondary | ICD-10-CM | POA: Insufficient documentation

## 2018-02-25 NOTE — Patient Instructions (Addendum)
Consider getting nuts in the bulk section so you can get a small amount When eating fruit eat protein with it, string cheese or nuts Follow through with signing up for the stress reduction program through community health Continuing with noticing when you haven't exercised much getting some activity like walking the stairs Aim to take food to work with you, having a roast beef sandwich Consider some of the sleep hygiene tips Consider talking to your doctor about more specifics about using the Novolog, especially when you are work and not able to check your blood sugar. Consider looking into the [facility] and their scholarship program

## 2018-02-25 NOTE — Progress Notes (Signed)
Diabetes Self-Management Education  Visit Type: Follow-up  Appt. Start Time: 0805 Appt. End Time: 0840  02/25/2018  [name], identified by name and date of birth, is a 58 y.o. female with a diagnosis of Diabetes: Type 2.   ASSESSMENT  Last menstrual period 03/13/2002. There is no height or weight on file to calculate BMI.   Pt reports nausea and vomiting has resolved since MD discontinued one of her mediations. Pt states she is relieves because N/V was affecting her ability to work or function.  Pt states she found employment at [employer] 3-4 days week, 11-4 or 12-4. Pt states sometimes she is sent home early if business is slow. Pt states although she does not get many hours, it has helped reduce her fear of running out of money and being homeless again.  Pt states she eats at work but they only have unhealthy food. Pt states it is hard to check BG before eating at work so does not adjust insulin based on BG.   Pt reports she plans to start taking food to work with her. Pt states she stopped eating peanuts because she cannot control how much she eats. Pt states she has improved with getting in balanced breakfast more consistently, loves tomatoes and eats often.  Pt states she has not told supervisor about DM, but plans to let her know next time she requests days off for healthcare appointments. Pt states her supervisor seems nice and understanding but she is expected to stay at her station and feels she can't walk away to take care of low blood sugar or check her BG. Although patient would like more hours, pt states she is very tired when she gets off work and often falls asleep right away.   Pt states although she is not in a good sleep pattern, but has improved some. Pt states she wakes up 6 am most mornings without an alarm. Pt states she didn't sleep well last night because she was worried she would sleep through alarm and miss this appointment. Pt states she has Ambien but  avoids relying on it regularly.  Pt states one of her biggest issues is stress. Pt states she was approached by someone at [facility] to participate in an upcoming stress reduction program and that they are offering a $15 gift card as an incentive to participate. Patient states she is looking forward to this opportunity.  Pt reports changes in medication management with addition of meal time insulin (novolog). Pt states it is difficult on the days when working to take breaks to check BG and take insulin.  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 Pt states when she gets off the bus after work she walks around [employer] before she walks home. States she mostly does this for safety issues so she does not do the same routine everyday to prevent being targeted for an attack. Pt states since the last year she has qualified to use [employer] transportation and states she is motivated to get out more now in early mornings or evenings, previously avoiding due to safety concerns walking to the bus stop. Pt states she wants to learn how to swim and will be looking into a [employer] scholarship.   Diabetes Self-Management Education - 02/25/18 0810      Visit Information   Visit Type  Follow-up  Initial Visit   Diabetes Type  Type 2      Dietary Intake   Breakfast  orange, grits, [place] sausage, hard boiled egg, tomatos OR sandwhich     Lunch  fried chicken, mashed potato or biscuit    Dinner  chiken leg, biscuit, green beans    Snack (evening)  crackers    Beverage(s)  water      Exercise   Exercise Type  Light (walking / raking leaves)    How many days per week to you exercise?  3    How many minutes per day do you exercise?  15    Total minutes per week of exercise  45      Patient Education   Nutrition management   Role of diet in the treatment of diabetes and the relationship between the  three main macronutrients and blood glucose level;Information on hints to eating out and maintain blood glucose control.    Physical activity and exercise   Other (comment)    Medications  Reviewed patients medication for diabetes, action, purpose, timing of dose and side effects.      Individualized Goals (developed by patient)   Nutrition  General guidelines for healthy choices and portions discussed    Physical Activity  --   aim to stay active most days   Medications  Other (comment)   clarify with MD Novolog use   Health Coping  Other (comment)   work on stress mgmt      Outcomes   Expected Outcomes  Demonstrated interest in learning. Expect positive outcomes    Future DMSE  4-6 wks    Program Status  Not Completed      Subsequent Visit   Since your last visit have you continued or begun to take your medications as prescribed?  Yes    Since your last visit have you had your blood pressure checked?  Yes    Is your most recent blood pressure lower, unchanged, or higher since your last visit?  Higher    Since your last visit, are you checking your blood glucose at least once a day?  Yes       Individualized Plan for Diabetes Self-Management Training:   Learning Objective:  Patient will have a greater understanding of diabetes self-management. Patient education plan is to attend individual and/or group sessions per assessed needs and concerns.    Patient Instructions  Consider getting nuts in the bulk section so you can get a small amount When eating fruit eat protein with it, string cheese or nuts Follow through with signing up for the stress reduction program through community health Continuing with noticing when you haven't exercised much getting some activity like walking the stairs Aim to take food to work with you, having a roast beef sandwich Consider some of the sleep hygiene tips Consider talking to your doctor about more specifics about using the Novolog, especially  when you are work and not able to check your blood sugar. Consider looking into the [employer] and their scholarship program  Expected Outcomes:  Demonstrated interest in learning. Expect positive outcomes  Education material provided: Sleep Hygiene  If problems or questions, patient to contact team via:  Phone  Future DSME appointment: 4-6 wks

## 2018-03-01 ENCOUNTER — Ambulatory Visit: Payer: Self-pay | Admitting: Endocrinology

## 2018-03-05 MED FILL — !LANTUS SOLOSTAR 100UNITS/M: 100 | 18 days supply | Qty: 9 | Fill #1

## 2018-03-08 ENCOUNTER — Other Ambulatory Visit: Payer: Self-pay | Admitting: Internal Medicine

## 2018-03-08 DIAGNOSIS — Z794 Long term (current) use of insulin: Principal | ICD-10-CM

## 2018-03-08 DIAGNOSIS — E1129 Type 2 diabetes mellitus with other diabetic kidney complication: Secondary | ICD-10-CM

## 2018-03-08 DIAGNOSIS — R809 Proteinuria, unspecified: Principal | ICD-10-CM

## 2018-03-08 DIAGNOSIS — E1165 Type 2 diabetes mellitus with hyperglycemia: Principal | ICD-10-CM

## 2018-03-08 DIAGNOSIS — IMO0002 Reserved for concepts with insufficient information to code with codable children: Secondary | ICD-10-CM

## 2018-03-08 MED FILL — $novoLOG FLEXPEN SYRINGE: 100 | 57 days supply | Qty: 24 | Fill #1

## 2018-03-09 MED FILL — TRUEPLUS PEN NDL 31GX3/16": 31G X 5 MM | 30 days supply | Qty: 100 | Fill #0

## 2018-03-09 MED FILL — TRUEPLUS PEN NDL 31GX3/16: 31G X 5 MM | 30 days supply | Qty: 100 | Fill #0

## 2018-03-22 ENCOUNTER — Other Ambulatory Visit: Payer: Self-pay | Admitting: Internal Medicine

## 2018-03-22 DIAGNOSIS — R252 Cramp and spasm: Secondary | ICD-10-CM

## 2018-03-22 MED FILL — TRUE METRIX TEST STRIP: 25 days supply | Qty: 100 | Fill #1

## 2018-03-23 MED FILL — CYCLOBENZAPRINE 10 MG TAB: 10 | 30 days supply | Qty: 30 | Fill #0

## 2018-03-26 MED FILL — !LANTUS SOLOSTAR 100UNITS/M: 100 | 24 days supply | Qty: 12 | Fill #2

## 2018-04-06 ENCOUNTER — Ambulatory Visit: Payer: Self-pay | Attending: Internal Medicine | Admitting: Internal Medicine

## 2018-04-06 ENCOUNTER — Other Ambulatory Visit: Payer: Self-pay

## 2018-04-06 ENCOUNTER — Ambulatory Visit: Payer: Self-pay | Admitting: Registered"

## 2018-04-06 ENCOUNTER — Other Ambulatory Visit: Payer: Self-pay | Admitting: Endocrinology

## 2018-04-06 DIAGNOSIS — IMO0002 Reserved for concepts with insufficient information to code with codable children: Secondary | ICD-10-CM

## 2018-04-06 DIAGNOSIS — N184 Chronic kidney disease, stage 4 (severe): Secondary | ICD-10-CM

## 2018-04-06 DIAGNOSIS — I10 Essential (primary) hypertension: Secondary | ICD-10-CM

## 2018-04-06 DIAGNOSIS — E1165 Type 2 diabetes mellitus with hyperglycemia: Secondary | ICD-10-CM

## 2018-04-06 DIAGNOSIS — E1121 Type 2 diabetes mellitus with diabetic nephropathy: Secondary | ICD-10-CM

## 2018-04-06 MED ORDER — CARVEDILOL 25 MG PO TABS: 37.5000 mg | ORAL_TABLET | Freq: Two times a day (BID) | ORAL | 2 refills | Status: DC

## 2018-04-06 MED FILL — ?AMLODIPINE BESYLATE 10 MG: 10 | 90 days supply | Qty: 90 | Fill #3

## 2018-04-06 MED FILL — ?CARVEDILOL 25 MG TABLET: 25 | 90 days supply | Qty: 270 | Fill #3

## 2018-04-06 NOTE — Progress Notes (Signed)
Pt states her sugars has been high and low. Pt states she knows it her and not the medicine

## 2018-04-06 NOTE — Progress Notes (Signed)
Virtual Visit via Telephone Note  I connected with Ann Bryan on 04/06/18 at 1:38 p.m by telephone from my office and verified that I am speaking with the correct person using two identifiers.  Patient is at home.   I discussed the limitations, risks, security and privacy concerns of performing an evaluation and management service by telephone and the availability of in person appointments. I also discussed with the patient that there may be a patient responsible charge related to this service. The patient expressed understanding and agreed to proceed.   History of Present Illness: Patient with history of DM, HTN, HL, seasonal allergies,anxiety/depression, glucoma,nephrotic range proteinuriaand insomnia.  Patient was last seen the end of December.  DM: saw Dr. Dwyane Dee in January. Novolog 70/30 changed to Lantus and Novolog.  When asked how she is doing on the new insulin regimen, patient replied "it does what it is suppose to do when I do what I'm suppose to do." Forgets to take Lantus at times and when she does BS high the next morning and day Checks BS 2 x a day when she stripes.  Just got refill on supply of strips Doing better with eating habits.  She is no longer eating out which she attributes to not working at this time and due to the current stay at home order due to the Covid virus pandemic.  She admits that she was working at [employer] and was eating a lot of that fast food.  She saw the nutritionist the same day that she saw Dr. Dwyane Dee and found it helpful.   Not getting in her exercise but plans to start going outdoors more -no further N/V since we stopped Victoza on her last visit in December. Has follow-up with Dr. Dwyane Dee 04/15/2018  HTN:  BP high at visit with Dwyane Dee so he added clonidine patch which she is taking and tolerating.  Reports compliance with her other blood pressure medications that include lisinopril/HCTZ, amlodipine, carvedilol . No device to check BP She tries to limit  salt in the foods.  No chest pain or shortness of breath.  CKD:  Saw [facility] neph Dr. Percell Miller in [facility] 03/16/2018.  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  [name] discussion was had about her very quickly approaching need for dialysis.  Blood test done there revealed creatinine of 3.96 with estimated GFR of 14.  PTH was 429.  Patient has a follow-up visit in May when she will receive further education about dialysis.  Patient requesting a letter for her job.  She was working at a [employer] but has decided to sit out because of COVID-19 because she feels she is at high risk given her underlying medical issues.  She requests that the letter be mailed to her.  Observations/Objective: No direct observations done as this was telephone encounter.  Assessment and Plan: 1. Uncontrolled type 2 diabetes mellitus with nephropathy ([place]) -Discussed the importance of compliance with taking the Lantus and NovoLog insulin as prescribed by the endocrinologist.  She has seen a noticeable decrease in blood sugars when she complies with taking it.  Advised to check blood sugars at least twice a day.  Our goal at this time is to prevent further complications of diabetes from developing. -Discussed healthy eating habits and commended her on cutting out fast foods. -Encouraged her to get out and walk several times a week if only for 15 minutes  2. Essential hypertension Blood pressure is not  at goal on visit with Dr. Dwyane Dee in January but had improved when she saw the nephrologist about 3 weeks ago.  She will continue current medications and low-salt diet - carvedilol (COREG) 25 MG tablet; Take 1.5 tablets (37.5 mg total) by mouth 2 (two) times daily with a meal.  Dispense: 180 tablet; Refill: 2  3. CKD (chronic kidney disease) stage 4, GFR 15-29 ml/min (HCC) Patient has established with nephrologist.  Stressed the importance of good diabetes and blood pressure control.   Nonetheless she is approaching dialysis.   Follow Up Instructions: Patient to follow-up with me again in 2 months Letter will be mailed out per her request   I discussed the assessment and treatment plan with the patient. The patient was provided an opportunity to ask questions and all were answered. The patient agreed with the plan and demonstrated an understanding of the instructions.   The patient was advised to call back or seek an in-person evaluation if the symptoms worsen or if the condition fails to improve as anticipated.  I provided 17 minutes of non-face-to-face time during this encounter.   Karle Plumber, MD

## 2018-04-13 ENCOUNTER — Ambulatory Visit: Payer: Self-pay | Admitting: Endocrinology

## 2018-04-13 MED FILL — cloNIDine 0.1 MG/24HR PTWK: 0.1 | 28 days supply | Qty: 4 | Fill #0

## 2018-04-14 ENCOUNTER — Other Ambulatory Visit: Payer: Self-pay | Admitting: Pharmacist

## 2018-04-14 DIAGNOSIS — I10 Essential (primary) hypertension: Secondary | ICD-10-CM

## 2018-04-14 MED ORDER — LISINOPRIL-HYDROCHLOROTHIAZIDE 20-12.5 MG PO TABS: 1.0000 | ORAL_TABLET | Freq: Every day | ORAL | 6 refills | Status: DC

## 2018-04-14 MED FILL — LISINOPRIL-HCTZ 20-12.5 MG: 20-12.5 | 90 days supply | Qty: 90 | Fill #0

## 2018-04-15 ENCOUNTER — Other Ambulatory Visit: Payer: Self-pay

## 2018-04-15 ENCOUNTER — Ambulatory Visit: Payer: Self-pay | Admitting: Registered"

## 2018-04-15 ENCOUNTER — Encounter: Payer: Self-pay | Admitting: Endocrinology

## 2018-04-15 ENCOUNTER — Ambulatory Visit: Payer: Self-pay | Admitting: Dietician

## 2018-04-16 NOTE — Progress Notes (Signed)
This encounter was created in error - please disregard.

## 2018-04-26 ENCOUNTER — Other Ambulatory Visit: Payer: Self-pay | Admitting: Internal Medicine

## 2018-04-26 DIAGNOSIS — R252 Cramp and spasm: Secondary | ICD-10-CM

## 2018-04-26 MED FILL — !LANTUS SOLOSTAR 100UNITS/M: 100 | 24 days supply | Qty: 12 | Fill #3

## 2018-04-26 MED FILL — TRUE METRIX TEST STRIP: 25 days supply | Qty: 100 | Fill #2

## 2018-04-26 MED FILL — CYCLOBENZAPRINE 10 MG TAB: 10 | 30 days supply | Qty: 30 | Fill #0

## 2018-05-07 ENCOUNTER — Other Ambulatory Visit: Payer: Self-pay | Admitting: Endocrinology

## 2018-05-07 MED FILL — cloNIDine 0.1 MG/24HR PTWK: 0.1 | 30 days supply | Qty: 4 | Fill #0

## 2018-05-11 MED FILL — ?ATORVASTATIN 20 MG TABLET: 20 | 30 days supply | Qty: 30 | Fill #1

## 2018-05-20 MED FILL — TRUEPLUS PEN NDL 31GX3/16: 31G X 5 MM | 30 days supply | Qty: 100 | Fill #1

## 2018-05-20 MED FILL — ?BASAGLAR 100 UNITS/ML KWPE: 100 | 24 days supply | Qty: 12 | Fill #4

## 2018-05-20 MED FILL — TRUEPLUS PEN NDL 31GX3/16": 31G X 5 MM | 30 days supply | Qty: 100 | Fill #1

## 2018-06-21 ENCOUNTER — Other Ambulatory Visit: Payer: Self-pay | Admitting: Endocrinology

## 2018-06-21 ENCOUNTER — Other Ambulatory Visit: Payer: Self-pay | Admitting: Internal Medicine

## 2018-06-21 DIAGNOSIS — R252 Cramp and spasm: Secondary | ICD-10-CM

## 2018-06-21 MED FILL — ?BASAGLAR 100 UNITS/ML KWPE: 100 | 30 days supply | Qty: 15 | Fill #0

## 2018-06-21 MED FILL — CYCLOBENZAPRINE 10 MG TAB: 10 | 30 days supply | Qty: 30 | Fill #0

## 2018-07-08 ENCOUNTER — Other Ambulatory Visit: Payer: Self-pay | Admitting: Endocrinology

## 2018-07-08 MED FILL — CLONIDINE 0.1 MG/24HR PTWK: 0.1 | 28 days supply | Qty: 4 | Fill #0

## 2018-07-19 MED FILL — ?BASAGLAR 100 UNITS/ML KWPE: 100 | 30 days supply | Qty: 15 | Fill #1

## 2018-07-20 MED FILL — $novoLOG FLEXPEN SYRINGE: 100 | 42 days supply | Qty: 18 | Fill #2

## 2018-07-28 ENCOUNTER — Ambulatory Visit: Payer: Self-pay | Attending: Internal Medicine

## 2018-07-28 ENCOUNTER — Other Ambulatory Visit: Payer: Self-pay

## 2018-08-03 MED FILL — LISINOPRIL-HCTZ 20-12.5 MG: 20-12.5 | 90 days supply | Qty: 90 | Fill #1

## 2018-08-17 MED FILL — hydrALAZINE HCL 25 MG TABS: 25 | 30 days supply | Qty: 90 | Fill #0

## 2018-08-17 MED FILL — VIT D2 1.25 MG (50,000 UNIT: 1.25 MG | 84 days supply | Qty: 12 | Fill #0

## 2018-08-18 ENCOUNTER — Other Ambulatory Visit: Payer: Self-pay | Admitting: Internal Medicine

## 2018-08-18 ENCOUNTER — Other Ambulatory Visit: Payer: Self-pay | Admitting: Endocrinology

## 2018-08-18 DIAGNOSIS — R252 Cramp and spasm: Secondary | ICD-10-CM

## 2018-08-18 MED FILL — ?ATORVASTATIN 20 MG TABLET: 20 | 30 days supply | Qty: 30 | Fill #2

## 2018-08-18 MED FILL — ?AMLODIPINE BESYLATE 10 MG: 10 | 90 days supply | Qty: 90 | Fill #4

## 2018-08-18 MED FILL — CLONIDINE 0.1 MG/24HR PTWK: 0.1 | 28 days supply | Qty: 4 | Fill #0

## 2018-08-18 MED FILL — ?BASAGLAR 100 UNITS/ML KWPE: 100 | 30 days supply | Qty: 15 | Fill #2

## 2018-08-18 MED FILL — ?CARVEDILOL 25 MG TABLET: 25 | 30 days supply | Qty: 90 | Fill #4

## 2018-08-19 MED FILL — CYCLOBENZAPRINE 10 MG TAB: 10 | 10 days supply | Qty: 10 | Fill #0

## 2018-08-23 ENCOUNTER — Ambulatory Visit: Payer: Self-pay | Admitting: Internal Medicine

## 2018-09-01 ENCOUNTER — Other Ambulatory Visit: Payer: Self-pay | Admitting: Internal Medicine

## 2018-09-01 MED ORDER — HYDRALAZINE HCL 25 MG PO TABS: 25.0000 mg | ORAL_TABLET | Freq: Three times a day (TID) | ORAL | 5 refills | Status: DC

## 2018-09-08 ENCOUNTER — Emergency Department (HOSPITAL_COMMUNITY)
Admission: EM | Admit: 2018-09-08 | Discharge: 2018-09-09 | Disposition: A | Discharge status: Home / Self Care | Payer: Self-pay | Attending: Emergency Medicine | Admitting: Emergency Medicine

## 2018-09-08 ENCOUNTER — Emergency Department (HOSPITAL_COMMUNITY): Payer: Self-pay

## 2018-09-08 ENCOUNTER — Other Ambulatory Visit: Payer: Self-pay

## 2018-09-08 DIAGNOSIS — I129 Hypertensive chronic kidney disease with stage 1 through stage 4 chronic kidney disease, or unspecified chronic kidney disease: Secondary | ICD-10-CM | POA: Insufficient documentation

## 2018-09-08 DIAGNOSIS — N189 Chronic kidney disease, unspecified: Secondary | ICD-10-CM

## 2018-09-08 DIAGNOSIS — E860 Dehydration: Secondary | ICD-10-CM

## 2018-09-08 DIAGNOSIS — R739 Hyperglycemia, unspecified: Secondary | ICD-10-CM

## 2018-09-08 DIAGNOSIS — Z794 Long term (current) use of insulin: Secondary | ICD-10-CM | POA: Insufficient documentation

## 2018-09-08 DIAGNOSIS — N184 Chronic kidney disease, stage 4 (severe): Secondary | ICD-10-CM | POA: Insufficient documentation

## 2018-09-08 DIAGNOSIS — R112 Nausea with vomiting, unspecified: Secondary | ICD-10-CM

## 2018-09-08 DIAGNOSIS — E872 Acidosis, unspecified: Secondary | ICD-10-CM

## 2018-09-08 DIAGNOSIS — E1165 Type 2 diabetes mellitus with hyperglycemia: Secondary | ICD-10-CM | POA: Insufficient documentation

## 2018-09-08 DIAGNOSIS — Z79899 Other long term (current) drug therapy: Secondary | ICD-10-CM | POA: Insufficient documentation

## 2018-09-08 LAB — COMPREHENSIVE METABOLIC PANEL
ALT: 23 U/L (ref 0–44)
AST: 14 U/L — ABNORMAL LOW (ref 15–41)
Albumin: 2.8 g/dL — ABNORMAL LOW (ref 3.5–5.0)
Alkaline Phosphatase: 108 U/L (ref 38–126)
Anion gap: 18 — ABNORMAL HIGH (ref 5–15)
BUN: 98 mg/dL — ABNORMAL HIGH (ref 6–20)
CO2: 14 mmol/L — ABNORMAL LOW (ref 22–32)
Calcium: 7.4 mg/dL — ABNORMAL LOW (ref 8.9–10.3)
Chloride: 106 mmol/L (ref 98–111)
Creatinine, Ser: 6.53 mg/dL — ABNORMAL HIGH (ref 0.44–1.00)
GFR calc Af Amer: 8 mL/min — ABNORMAL LOW (ref >60)
GFR calc non Af Amer: 7 mL/min — ABNORMAL LOW (ref >60)
Glucose, Bld: 299 mg/dL — ABNORMAL HIGH (ref 70–99)
Potassium: 4.9 mmol/L (ref 3.5–5.1)
Sodium: 138 mmol/L (ref 135–145)
Total Bilirubin: 1.3 mg/dL — ABNORMAL HIGH (ref 0.3–1.2)
Total Protein: 4.9 g/dL — ABNORMAL LOW (ref 6.5–8.1)

## 2018-09-08 LAB — CBC
HCT: 31.6 % — ABNORMAL LOW (ref 36.0–46.0)
Hemoglobin: 10.4 g/dL — ABNORMAL LOW (ref 12.0–15.0)
MCH: 27.6 pg (ref 26.0–34.0)
MCHC: 32.9 g/dL (ref 30.0–36.0)
MCV: 83.8 fL (ref 80.0–100.0)
Platelets: 136 10*3/uL — ABNORMAL LOW (ref 150–400)
RBC: 3.77 MIL/uL — ABNORMAL LOW (ref 3.87–5.11)
RDW: 13.5 % (ref 11.5–15.5)
WBC: 9.2 10*3/uL (ref 4.0–10.5)
nRBC: 0 % (ref 0.0–0.2)

## 2018-09-08 LAB — LIPASE, BLOOD: Lipase: 39 U/L (ref 11–51)

## 2018-09-08 MED ORDER — SODIUM CHLORIDE 0.9 % IV BOLUS
1000.0000 mL | Freq: Once | INTRAVENOUS | Status: AC
  Administered 2018-09-08: 1000 mL via INTRAVENOUS

## 2018-09-08 MED ORDER — AMLODIPINE BESYLATE 5 MG PO TABS
10.0000 mg | ORAL_TABLET | Freq: Every day | ORAL | Status: DC
  Administered 2018-09-08: 10 mg via ORAL
  Filled 2018-09-08: qty 2

## 2018-09-08 MED ORDER — SODIUM CHLORIDE 0.9% FLUSH: 3.0000 mL | Freq: Once | INTRAVENOUS | Status: DC

## 2018-09-08 MED ORDER — HYDRALAZINE HCL 25 MG PO TABS
25.0000 mg | ORAL_TABLET | Freq: Three times a day (TID) | ORAL | Status: DC
  Administered 2018-09-08: 25 mg via ORAL
  Filled 2018-09-08: qty 1

## 2018-09-08 MED ORDER — ONDANSETRON HCL 4 MG/2ML IJ SOLN
4.0000 mg | Freq: Once | INTRAMUSCULAR | Status: AC
  Administered 2018-09-08: 4 mg via INTRAVENOUS
  Filled 2018-09-08: qty 2

## 2018-09-08 MED ORDER — CARVEDILOL 12.5 MG PO TABS
37.5000 mg | ORAL_TABLET | Freq: Two times a day (BID) | ORAL | Status: DC
  Administered 2018-09-09: 01:00:00 37.5 mg via ORAL
  Filled 2018-09-08: qty 3

## 2018-09-08 NOTE — ED Provider Notes (Signed)
[facility] EMERGENCY DEPARTMENT Provider Note   CSN: [id number] Arrival date & time: 09/08/18  1528     History   Chief Complaint Chief Complaint  Patient presents with  . Nausea    HPI Ann Bryan is a 58 y.o. female.    58 y/o female with hx of stage V CKD, IDDM, HLD, HTN present to the ED for c/o SOB, N/V. Patient states that she began to feel unwell approximately 2 weeks ago.  She reports increased fatigue followed by onset of worsening shortness of breath.  Her symptoms were most severe 1 week ago.  Her breathing was significantly labored which worsened with any degree of exertion or ambulation.  She also had a hard time turning over in the bed as this made her feel more winded.  Reports feeling like there was excessive congestion in her chest.  She planned to call 911, but could not find her cell phone.  Ended up falling asleep and waking up the next morning feeling much better.  States that her shortness of breath has significantly improved, but she has continued to have persistent nausea with sporadic vomiting.  Reports only drinking water for at least the past 48 hours.  She is concerned that her symptoms may be related to her worsening kidney function.  Patient endorses noncompliance with her daily medications over the past 3 days secondary to nausea.  She was hesitant to use her insulin because she was not eating.  No fevers, orthopnea, leg swelling, sick contacts, chest pain.   The history is provided by the patient. No language interpreter was used.    Past Medical History:  Diagnosis Date  . Cataract    right eye  . Depression   . Diabetes mellitus 1986    poorly controlled last hemoglobin A1c  10.5 on January 01, 2010, increase since June 2011 when hemoglobin A1c was 9.3 , determined to be most likely secondary to non-compliance.  . Diabetic nephropathy ([place])   . Diabetic retinopathy   . Glaucoma   . Hyperlipidemia   . Hypertension   . Nausea and  vomiting   . Retinopathy     Patient Active Problem List   Diagnosis Date Noted  . Diabetes mellitus without complication ([place]) 41/28/7867  . Nausea and vomiting 09/28/2017  . Insomnia 06/05/2017  . CKD (chronic kidney disease) stage 4, GFR 15-29 ml/min (HCC) 06/05/2017  . Chronic seasonal allergic rhinitis 12/10/2015  . Arthralgia of both hands 12/10/2015  . Severe episode of recurrent major depressive disorder, without psychotic features ([place]) 12/10/2015  . NEPHROPATHY, DIABETIC 09/23/2006  . Essential hypertension 12/23/2005  . DM (diabetes mellitus) type II uncontrolled with eye manifestation ([place]) 01/07/1984    Past Surgical History:  Procedure Laterality Date  . ABDOMINAL HYSTERECTOMY   2004     OB History   No obstetric history on file.      Home Medications    Prior to Admission medications   Medication Sig Start Date End Date Taking? Authorizing Provider  amLODipine (NORVASC) 10 MG tablet Take 1 tablet (10 mg total) by mouth daily. 11/02/17   Ladell Pier, MD  atorvastatin (LIPITOR) 20 MG tablet Take 1 tablet (20 mg total) by mouth daily. 01/04/18   Ladell Pier, MD  Blood Glucose Monitoring Suppl (TRUE METRIX METER) w/Device KIT 1 each by Does not apply route as needed. 01/04/18   Ladell Pier, MD  carvedilol (COREG) 25 MG tablet Take 1.5 tablets (37.5  mg total) by mouth 2 (two) times daily with a meal. 04/06/18   Ladell Pier, MD  cetirizine (ZYRTEC) 10 MG tablet Take 1 tablet (10 mg total) by mouth daily. 02/17/17   Ladell Pier, MD  cloNIDine (CATAPRES - DOSED IN MG/24 HR) 0.1 mg/24hr patch PLACE 1 PATCH (0.1 MG TOTAL) ONTO THE SKIN ONCE A WEEK. 08/18/18   Elayne Snare, MD  cyclobenzaprine (FLEXERIL) 10 MG tablet TAKE 1 TABLET BY MOUTH DAILY AS NEEDED FOR MUSCLE SPASMS. 08/19/18   Ladell Pier, MD  glucose blood (TRUE METRIX BLOOD GLUCOSE TEST) test strip 1 each by Other route 3 (three) times daily. 10/20/14   Boykin Nearing, MD   glucose blood test strip Use as instructed 01/25/18   Ladell Pier, MD  hydrALAZINE (APRESOLINE) 25 MG tablet Take 1 tablet (25 mg total) by mouth 3 (three) times daily. 09/01/18   Ladell Pier, MD  insulin aspart (NOVOLOG FLEXPEN) 100 UNIT/ML FlexPen Inject 10-14 Units into the skin 3 (three) times daily with meals. INJECT 10-14 UNITS UNDER THE SKIN 3 TIMES DAILY WITH MEALS. 02/01/18   Elayne Snare, MD  Insulin Glargine [facility]) 100 UNIT/ML SOPN INJECT 50 UNITS INTO THE SKIN DAILY 06/21/18   Elayne Snare, MD  Insulin Syringe-Needle U-100 (INSULIN SYRINGE 1CC/31GX5/16") 31G X 5/16" 1 ML MISC Use as directed.  BD 79m x 31G needles 01/22/17   JLadell Pier MD  Insulin Syringes, Disposable, U-100 0.5 ML MISC 1 each by Does not apply route 2 (two) times daily. 01/30/14   Funches, JAdriana Mccallum MD  metoCLOPramide (REGLAN) 5 MG tablet Take 1 tablet (5 mg total) by mouth 3 (three) times daily before meals. 01/04/18   JLadell Pier MD  TRUEPLUS LANCETS 28G MISC 1 each by Does not apply route 3 (three) times daily. 01/30/14   Funches, JAdriana Mccallum MD  TRUEPLUS PEN NEEDLES 31G X 5 MM MISC USE AS DIRECTED 03/09/18   JLadell Pier MD  zolpidem (AMBIEN) 5 MG tablet Take 1 tablet (5 mg total) by mouth at bedtime as needed. Each prescription to last a month. 06/04/17   JLadell Pier MD    Family History Family History  Problem Relation Age of Onset  . Diabetes Mother   . Stroke Mother   . Hypertension Mother   . Alcohol abuse Mother   . Depression Father        Committed suicide  . Alcohol abuse Maternal Uncle     Social History Social History   Tobacco Use  . Smoking status: Never Smoker  . Smokeless tobacco: Never Used  Substance Use Topics  . Alcohol use: No  . Drug use: No     Allergies   Doxycycline   Review of Systems Review of Systems Ten systems reviewed and are negative for acute change, except as noted in the HPI.    Physical Exam Updated Vital Signs  BP (!) 212/75 (BP Location: Right Arm)   Pulse 87   Temp 98.4 F (36.9 C) (Oral)   Resp 16   LMP 03/13/2002   SpO2 100%   Physical Exam Vitals signs and nursing note reviewed.  Constitutional:      General: She is not in acute distress.    Appearance: She is well-developed. She is not diaphoretic.     Comments: Nontoxic appearing and in NAD  HENT:     Head: Normocephalic and atraumatic.  Eyes:     General: No scleral icterus.  Conjunctiva/sclera: Conjunctivae normal.  Neck:     Musculoskeletal: Normal range of motion.  Cardiovascular:     Rate and Rhythm: Normal rate and regular rhythm.     Pulses: Normal pulses.  Pulmonary:     Effort: Pulmonary effort is normal. No respiratory distress.     Breath sounds: No stridor. No wheezing, rhonchi or rales.     Comments: Lungs CTAB. Respirations even and unlabored. Musculoskeletal: Normal range of motion.     Comments: No BLE edema  Skin:    General: Skin is warm and dry.     Coloration: Skin is not pale.     Findings: No erythema or rash.  Neurological:     Mental Status: She is alert and oriented to person, place, and time.     Comments: Ambulatory with steady gait.  Psychiatric:        Behavior: Behavior normal.      ED Treatments / Results  Labs (all labs ordered are listed, but only abnormal results are displayed) Labs Reviewed  COMPREHENSIVE METABOLIC PANEL - Abnormal; Notable for the following components:      Result Value   CO2 14 (*)    Glucose, Bld 299 (*)    BUN 98 (*)    Creatinine, Ser 6.53 (*)    Calcium 7.4 (*)    Total Protein 4.9 (*)    Albumin 2.8 (*)    AST 14 (*)    Total Bilirubin 1.3 (*)    GFR calc non Af Amer 7 (*)    GFR calc Af Amer 8 (*)    Anion gap 18 (*)    All other components within normal limits  CBC - Abnormal; Notable for the following components:   RBC 3.77 (*)    Hemoglobin 10.4 (*)    HCT 31.6 (*)    Platelets 136 (*)    All other components within normal limits   LIPASE, BLOOD  URINALYSIS, ROUTINE W REFLEX MICROSCOPIC    EKG None  Radiology No results found.  Procedures Procedures (including critical care time)  Medications Ordered in ED Medications  sodium chloride flush (NS) 0.9 % injection 3 mL (has no administration in time range)  sodium chloride 0.9 % bolus 1,000 mL (has no administration in time range)  ondansetron (ZOFRAN) injection 4 mg (has no administration in time range)  amLODipine (NORVASC) tablet 10 mg (has no administration in time range)  hydrALAZINE (APRESOLINE) tablet 25 mg (has no administration in time range)  carvedilol (COREG) tablet 37.5 mg (has no administration in time range)    11:18 PM Patient's labs reviewed on Care Everywhere. Creatinine on 08/06/18 was 6.36 with BUN of 109, GFR of 8. Patient continues to make urine.  [facility] 08/06/2018 03/16/2018 03/16/2018   142  143  4.6  4.7  109 (H)  107  18.0 (L)  20.0 (L)  15  16 (H)  109 (H)  72 (H)  6.36 (H)  3.96 (H)  17  18  7 (L)  12 (L)  8 (L)  14 (L)  85  69 (L)  8.3 (L) 8.6 8.6  5.1 (H)  5.0 (H)    3.6       Initial Impression / Assessment and Plan / ED Course  I have reviewed the triage vital signs and the nursing notes.  Pertinent labs & imaging results that were available during my care of the patient were reviewed by me and considered in my medical decision making (  see chart for details).        Patient presenting for multiple complaints.  Mostly concerned with fatigue and sporadic nausea and vomiting.  Has had very little to eat and drink over the past 48 hours as a result.  She did have some associated shortness of breath, but this has since resolved and she has no complaints of shortness of breath at this time.  Laboratory evaluation notable for chronic renal failure.  This is largely unchanged compared to her last nephrology follow-up.  She was noted to have metabolic acidosis which has improved with IV fluid hydration.   Anion gap has normalized on recheck.  She is mildly acidotic based on venous pH, but clinically is feeling much better.  There is suspect to be a chronic component to this given her CKD.    Patient tolerating PO soda and crackers while in the ED. Presently, she expresses desire and comfort for discharge and outpatient follow up. Will give course of antiemetics. I have encouraged her to follow up with her nephrologist as well as her primary care doctor to ensure resolution of symptoms. Return precautions discussed and provided. Patient discharged in stable condition with no unaddressed concerns.   Final Clinical Impressions(s) / ED Diagnoses   Final diagnoses:  Non-intractable vomiting with nausea, unspecified vomiting type  Dehydration  Chronic renal failure, unspecified CKD stage  Normal anion gap metabolic acidosis  Hyperglycemia    ED Discharge Orders         Ordered    ondansetron (ZOFRAN ODT) 4 MG disintegrating tablet  Every 8 hours PRN     09/09/18 0439           Humes, Kelly, PA-C 09/19/18 0324    Nanavati, Ankit, MD 09/19/18 2326  

## 2018-09-08 NOTE — ED Triage Notes (Signed)
Onset 6 days nausea and vomiting in am only.  Has not ate anything in 3 days and has not taken BP, diabetes meds in 3 days d/t nausea.

## 2018-09-09 LAB — URINALYSIS, ROUTINE W REFLEX MICROSCOPIC
Bilirubin Urine: NEGATIVE
Glucose, UA: >=500 mg/dL — AB
Ketones, ur: 5 mg/dL — AB
Leukocytes,Ua: NEGATIVE
Nitrite: NEGATIVE
Protein, ur: >=300 mg/dL — AB
Specific Gravity, Urine: 1.011 (ref 1.005–1.030)
pH: 5 (ref 5.0–8.0)

## 2018-09-09 LAB — BASIC METABOLIC PANEL
Anion gap: 14 (ref 5–15)
BUN: 99 mg/dL — ABNORMAL HIGH (ref 6–20)
CO2: 13 mmol/L — ABNORMAL LOW (ref 22–32)
Calcium: 6.8 mg/dL — ABNORMAL LOW (ref 8.9–10.3)
Chloride: 110 mmol/L (ref 98–111)
Creatinine, Ser: 6.2 mg/dL — ABNORMAL HIGH (ref 0.44–1.00)
GFR calc Af Amer: 8 mL/min — ABNORMAL LOW (ref >60)
GFR calc non Af Amer: 7 mL/min — ABNORMAL LOW (ref >60)
Glucose, Bld: 270 mg/dL — ABNORMAL HIGH (ref 70–99)
Potassium: 4.9 mmol/L (ref 3.5–5.1)
Sodium: 137 mmol/L (ref 135–145)

## 2018-09-09 LAB — POCT I-STAT EG7
Acid-base deficit: 12 mmol/L — ABNORMAL HIGH (ref 0.0–2.0)
Bicarbonate: 13.2 mmol/L — ABNORMAL LOW (ref 20.0–28.0)
Calcium, Ion: 0.99 mmol/L — ABNORMAL LOW (ref 1.15–1.40)
HCT: 26 % — ABNORMAL LOW (ref 36.0–46.0)
Hemoglobin: 8.8 g/dL — ABNORMAL LOW (ref 12.0–15.0)
O2 Saturation: 79 %
Patient temperature: 37
Potassium: 4.7 mmol/L (ref 3.5–5.1)
Sodium: 138 mmol/L (ref 135–145)
TCO2: 14 mmol/L — ABNORMAL LOW (ref 22–32)
pCO2, Ven: 27.9 mmHg — ABNORMAL LOW (ref 44.0–60.0)
pH, Ven: 7.281 (ref 7.250–7.430)
pO2, Ven: 47 mmHg — ABNORMAL HIGH (ref 32.0–45.0)

## 2018-09-09 LAB — CBG MONITORING, ED: Glucose-Capillary: 313 mg/dL — ABNORMAL HIGH (ref 70–99)

## 2018-09-09 MED ORDER — SODIUM BICARBONATE 8.4 % IV SOLN
50.0000 meq | Freq: Once | INTRAVENOUS | Status: AC
  Administered 2018-09-09: 05:00:00 50 meq via INTRAVENOUS
  Filled 2018-09-09: qty 50

## 2018-09-09 MED ORDER — ONDANSETRON 4 MG PO TBDP: 4.0000 mg | ORAL_TABLET | Freq: Three times a day (TID) | ORAL | 0 refills | Status: DC | PRN

## 2018-09-09 MED ORDER — INSULIN ASPART 100 UNIT/ML ~~LOC~~ SOLN
5.0000 [IU] | Freq: Once | SUBCUTANEOUS | Status: AC
  Administered 2018-09-09: 02:00:00 5 [IU] via SUBCUTANEOUS

## 2018-09-09 MED FILL — ONDANSETRON ODT 4 MG TABLET: 4 | 3 days supply | Qty: 10 | Fill #0

## 2018-09-09 NOTE — Discharge Instructions (Signed)
Continue your daily prescribed medications and drink plenty of fluids to prevent dehydration.  You may take Zofran as prescribed for ongoing nausea.   Your labs showed mild changes to your electrolytes which are favored to be due to dehydration, though this can be affected by your kidney function.  They should be rechecked by your primary care doctor by the end of the week.  You should also closely monitor your sugar levels and resume use of your insulin to prevent DKA.  You would also benefit from close follow-up with your kidney doctor at [facility].    Return to the emergency department for any new or concerning symptoms.

## 2018-09-23 MED FILL — ?ATORVASTATIN 20 MG TABLET: 20 | 30 days supply | Qty: 30 | Fill #3

## 2018-09-28 MED FILL — CALCITRIOL 0.25 MCG CAPS: 0.25 | 30 days supply | Qty: 30 | Fill #0

## 2018-10-06 MED FILL — hydrALAZINE HCL 50 MG TABS: 50 | 30 days supply | Qty: 90 | Fill #0

## 2018-10-14 ENCOUNTER — Encounter: Payer: Self-pay | Admitting: Surgery

## 2018-10-15 ENCOUNTER — Other Ambulatory Visit: Payer: Self-pay

## 2018-10-15 ENCOUNTER — Ambulatory Visit: Payer: Self-pay | Admitting: Internal Medicine

## 2018-11-10 MED FILL — CALCITRIOL 0.25 MCG CAPS: 0.25 | 30 days supply | Qty: 30 | Fill #0

## 2018-11-12 ENCOUNTER — Other Ambulatory Visit (HOSPITAL_COMMUNITY): Payer: Self-pay | Admitting: Nephrology

## 2018-11-12 DIAGNOSIS — Z992 Dependence on renal dialysis: Secondary | ICD-10-CM

## 2018-11-12 DIAGNOSIS — N186 End stage renal disease: Secondary | ICD-10-CM

## 2018-11-15 ENCOUNTER — Other Ambulatory Visit: Payer: Self-pay

## 2018-11-15 ENCOUNTER — Other Ambulatory Visit: Payer: Self-pay | Admitting: Radiology

## 2018-11-15 ENCOUNTER — Other Ambulatory Visit (HOSPITAL_COMMUNITY): Payer: Self-pay | Admitting: Nephrology

## 2018-11-15 ENCOUNTER — Encounter (HOSPITAL_COMMUNITY): Payer: Self-pay

## 2018-11-15 ENCOUNTER — Ambulatory Visit (HOSPITAL_COMMUNITY)
Admission: RE | Admit: 2018-11-15 | Discharge: 2018-11-15 | Disposition: A | Discharge status: Home / Self Care | Payer: Medicaid Other | Source: Ambulatory Visit | Attending: Nephrology | Admitting: Nephrology

## 2018-11-15 DIAGNOSIS — N185 Chronic kidney disease, stage 5: Secondary | ICD-10-CM | POA: Diagnosis not present

## 2018-11-15 DIAGNOSIS — F329 Major depressive disorder, single episode, unspecified: Secondary | ICD-10-CM | POA: Diagnosis not present

## 2018-11-15 DIAGNOSIS — Z79899 Other long term (current) drug therapy: Secondary | ICD-10-CM | POA: Insufficient documentation

## 2018-11-15 DIAGNOSIS — I120 Hypertensive chronic kidney disease with stage 5 chronic kidney disease or end stage renal disease: Secondary | ICD-10-CM | POA: Insufficient documentation

## 2018-11-15 DIAGNOSIS — E1122 Type 2 diabetes mellitus with diabetic chronic kidney disease: Secondary | ICD-10-CM | POA: Insufficient documentation

## 2018-11-15 DIAGNOSIS — E785 Hyperlipidemia, unspecified: Secondary | ICD-10-CM | POA: Insufficient documentation

## 2018-11-15 DIAGNOSIS — Z992 Dependence on renal dialysis: Secondary | ICD-10-CM

## 2018-11-15 DIAGNOSIS — Z794 Long term (current) use of insulin: Secondary | ICD-10-CM | POA: Diagnosis not present

## 2018-11-15 DIAGNOSIS — N186 End stage renal disease: Secondary | ICD-10-CM

## 2018-11-15 HISTORY — PX: IR US GUIDE VASC ACCESS RIGHT: IMG2390

## 2018-11-15 HISTORY — PX: IR FLUORO GUIDE CV LINE RIGHT: IMG2283

## 2018-11-15 LAB — CBC
HCT: 26.9 % — ABNORMAL LOW (ref 36.0–46.0)
Hemoglobin: 8.4 g/dL — ABNORMAL LOW (ref 12.0–15.0)
MCH: 27 pg (ref 26.0–34.0)
MCHC: 31.2 g/dL (ref 30.0–36.0)
MCV: 86.5 fL (ref 80.0–100.0)
Platelets: 226 10*3/uL (ref 150–400)
RBC: 3.11 MIL/uL — ABNORMAL LOW (ref 3.87–5.11)
RDW: 14.1 % (ref 11.5–15.5)
WBC: 7.3 10*3/uL (ref 4.0–10.5)
nRBC: 0 % (ref 0.0–0.2)

## 2018-11-15 LAB — BASIC METABOLIC PANEL
Anion gap: 13 (ref 5–15)
BUN: 88 mg/dL — ABNORMAL HIGH (ref 6–20)
CO2: 15 mmol/L — ABNORMAL LOW (ref 22–32)
Calcium: 7.6 mg/dL — ABNORMAL LOW (ref 8.9–10.3)
Chloride: 114 mmol/L — ABNORMAL HIGH (ref 98–111)
Creatinine, Ser: 11.3 mg/dL — ABNORMAL HIGH (ref 0.44–1.00)
GFR calc Af Amer: 4 mL/min — ABNORMAL LOW (ref >60)
GFR calc non Af Amer: 3 mL/min — ABNORMAL LOW (ref >60)
Glucose, Bld: 225 mg/dL — ABNORMAL HIGH (ref 70–99)
Potassium: 5.2 mmol/L — ABNORMAL HIGH (ref 3.5–5.1)
Sodium: 142 mmol/L (ref 135–145)

## 2018-11-15 LAB — GLUCOSE, CAPILLARY: Glucose-Capillary: 212 mg/dL — ABNORMAL HIGH (ref 70–99)

## 2018-11-15 LAB — PROTIME-INR
INR: 1.1 (ref 0.8–1.2)
Prothrombin Time: 14 seconds (ref 11.4–15.2)

## 2018-11-15 MED ORDER — HEPARIN SODIUM (PORCINE) 1000 UNIT/ML IJ SOLN
INTRAMUSCULAR | Status: AC | PRN
  Administered 2018-11-15: 3800 [IU] via INTRAVENOUS

## 2018-11-15 MED ORDER — LIDOCAINE HCL 1 % IJ SOLN
INTRAMUSCULAR | Status: AC | PRN
  Administered 2018-11-15: 5 mL

## 2018-11-15 MED ORDER — LIDOCAINE HCL 1 % IJ SOLN
INTRAMUSCULAR | Status: AC
  Filled 2018-11-15: qty 20

## 2018-11-15 MED ORDER — GELATIN ABSORBABLE 12-7 MM EX MISC
CUTANEOUS | Status: AC | PRN
  Administered 2018-11-15: 1 via TOPICAL

## 2018-11-15 MED ORDER — CEFAZOLIN SODIUM-DEXTROSE 2-4 GM/100ML-% IV SOLN
2.0000 g | Freq: Once | INTRAVENOUS | Status: AC
  Administered 2018-11-15: 2 g via INTRAVENOUS

## 2018-11-15 MED ORDER — GELATIN ABSORBABLE 12-7 MM EX MISC
CUTANEOUS | Status: AC
  Filled 2018-11-15: qty 1

## 2018-11-15 MED ORDER — ONDANSETRON 4 MG PO TBDP: 4.0000 mg | ORAL_TABLET | Freq: Once | ORAL | Status: DC

## 2018-11-15 MED ORDER — CEFAZOLIN SODIUM-DEXTROSE 2-4 GM/100ML-% IV SOLN
INTRAVENOUS | Status: AC
  Filled 2018-11-15: qty 100

## 2018-11-15 MED ORDER — HEPARIN SODIUM (PORCINE) 1000 UNIT/ML IJ SOLN
INTRAMUSCULAR | Status: AC
  Filled 2018-11-15: qty 1

## 2018-11-15 MED ORDER — FENTANYL CITRATE (PF) 100 MCG/2ML IJ SOLN
INTRAMUSCULAR | Status: AC
  Filled 2018-11-15: qty 2

## 2018-11-15 MED ORDER — ONDANSETRON HCL 4 MG/2ML IJ SOLN
INTRAMUSCULAR | Status: AC
  Administered 2018-11-15: 4 mg
  Filled 2018-11-15: qty 2

## 2018-11-15 MED ORDER — FENTANYL CITRATE (PF) 100 MCG/2ML IJ SOLN
INTRAMUSCULAR | Status: AC | PRN
  Administered 2018-11-15: 25 ug via INTRAVENOUS

## 2018-11-15 MED ORDER — SODIUM CHLORIDE 0.9 % IV SOLN: INTRAVENOUS | Status: DC

## 2018-11-15 MED FILL — hydrALAZINE HCL 50 MG TABS: 50 | 30 days supply | Qty: 90 | Fill #1

## 2018-11-15 MED FILL — ?CARVEDILOL 25MG TABLE: 25 | 30 days supply | Qty: 90 | Fill #1

## 2018-11-15 NOTE — Discharge Instructions (Signed)
Vascular Access for Hemodialysis        A vascular access is a connection to the blood inside your blood vessels that allows blood to be easily removed from your body and returned to your body during kidney dialysis (hemodialysis). Hemodialysis is a procedure in which a machine outside of the body filters the blood of a person whose kidneys are no longer working properly. There are three types of vascular accesses:  Arteriovenous fistula (AVF). This is a connection between an artery and a vein (usually in the arm) that is made by sewing them together. Blood in the artery flows directly into the vein, causing it to get larger over time. This makes it easier for the vein to be used for hemodialysis. An arteriovenous fistula takes 1-6 months to develop after surgery.  Arteriovenous graft (AVG). This is a connection between an artery and a vein in the arm that is made with a tube. An arteriovenous graft can be used within 2-3 weeks of surgery.  Venous catheter. This is a thin, flexible tube that is placed in a large vein (usually in the neck, chest, or groin). A venous catheter for hemodialysis contains two tubes that come out of the skin. A venous catheter can be used right away. It is usually used as a temporary access if you need hemodialysis before a fistula or graft has developed, or if kidney failure is sudden (acute) and likely to improve without the need for long-term dialysis. It may also be used as a permanent access if a fistula or graft cannot be created. Which type of access is best for me? The type of access that is best for you depends on the size and strength of your veins, your age, and any other health problems that you have, such as diabetes. An ultrasound test may be used to look at your veins to help make this decision. A fistula is usually the preferred type of access. It can last several years and is less likely than the other types of accesses to become infected or to cause a blood  clot within a blood vessel (thrombosis). However, a fistula is not an option for everyone. If your veins are not the right size or if the fistula does not develop properly, a graft may be used instead. Grafts require you to have strong veins. If your veins are not strong enough for a graft, a catheter may be used. Catheters are more likely than fistulas and grafts to become infected or to have a thrombosis. Sometimes, only one type of access is an option. Your health care provider will help you determine which type of access is best for you. How is a vascular access used? The way that the access is used depends on the type of access:  If the access is a fistula or graft, two needles are inserted through the skin into the access before each hemodialysis session. Blood leaves the body through one of the needles and travels through a tube to the hemodialysis machine (dialyzer). Then it flows through another tube and returns to the body through the second needle.  If the access is a catheter, one tube is connected directly to the tube that leads to the dialyzer, and the other tube is connected to a tube that leads away from the dialyzer. Blood leaves the body through one tube and returns to the body through the other. What problems can occur with vascular access?  A blood clot within a blood vessel (thrombosis).   Thrombosis can lead to a narrowing of a blood vessel (stenosis). If thrombosis occurs frequently, another access site may be created as a backup.  Infection.  Heart enlargement (cardiomegaly) and heart failure. Changes in blood flow may cause an increase in blood pressure or heart rate, making your heart work harder to pump blood. These problems are most likely to occur with a venous catheter and least likely to occur with an arteriovenous fistula. How do I care for my vascular access?  Wear a medical alert bracelet. In case of an emergency, this bracelet tells health care providers that you  are a dialysis patient and allows them to care for your veins appropriately. If you have a graft or fistula:  A "bruit" is a noise that is heard with a stethoscope, and a "thrill" is a vibration that is felt over the graft or fistula. The presence of the bruit and thrill indicates that the access is working. You will be taught to feel for the thrill each day. If this is not felt, the access may be clotted. Call your health care provider.  Keep your arm straight and raised (elevated) above your heart while the access site is healing.  You may freely use the arm where your vascular access is located after the site heals. Keep the following in mind: ? Avoid pressure on the arm. ? Avoid lifting heavy objects with the arm. ? Avoid sleeping on the arm. ? Avoid wearing tight-sleeved shirts or jewelry around the graft or fistula.  Do not allow blood pressure monitoring or needle punctures on the side where the graft or fistula is located.  With permission from your health care provider, you may do exercises to help with blood flow through a fistula. These exercises involve squeezing a rubber ball or other soft objects as instructed.  Wash your access site according to directions from your health care provider. If you have a venous catheter:  Keep the insertion site clean and dry at all times.  If you are told you can shower after the site heals, use a protective covering over the catheter to keep it dry.  Follow directions from your health care provider for bandage (dressing) changes.  Ask your health care provider what activities are safe for you. You may be restricted from lifting or making repetitive arm movements on the side with the catheter. Contact a health care provider if:  Swelling around the graft or fistula gets worse.  You develop new pain.  Your catheter gets damaged. Get help right away if:  You have pain, numbness, an unusual pale skin color, or blue fingers or sores at  the tips of your fingers in the hand on the side of your fistula.  You have chills.  You have a fever.  You have pus or other fluid (drainage) at the vascular access site.  You develop skin redness or red streaking on the skin around, above, or below the vascular access.  You have bleeding at the vascular access that cannot be easily controlled.  Your catheter gets pulled out of place.  You feel your heart racing or skipping beats.  You have chest pain. Summary  A vascular access is a connection to the blood inside your blood vessels that allows blood to be easily removed from your body and returned to your body during kidney dialysis (hemodialysis).  There are three types of vascular accesses.The type of access that is best for you depends on the size and strength of your   veins, your age, and any other health problems that you have, such as diabetes. °· A fistula is usually the preferred type of access, although it is not an option for everyone. It can last several years and is less likely than the other types of accesses to become infected or to cause a blood clot within a blood vessel (thrombosis). °· Wear a medical alert bracelet. In case of an emergency, this tells health care providers that you are a dialysis patient. °This information is not intended to replace advice given to you by your health care provider. Make sure you discuss any questions you have with your health care provider. °Document Released: 03/15/2002 Document Revised: 04/13/2018 Document Reviewed: 01/18/2016 °Elsevier Patient Education © 2020 [employer] ° °

## 2018-11-15 NOTE — Procedures (Signed)
ESRD  S/p RT IJ TUNNELED HD CATH  Tip svcra No comp Stable ebl min Ready for use Full report in pacs  

## 2018-11-15 NOTE — H&P (Signed)
Chief Complaint: Patient was seen in consultation today for tunneled dialysis catheter placement at the request of [place] B  Referring Physician(s): [place] [facility]  Supervising Physician: Daryll Brod  Patient Status: [facility] - Out-pt  History of Present Illness: Ann Bryan is a 58 y.o. female   CKD stage 49 Follows with Dr Joelyn Oms for months Diabetic nephropathy Nephrotic proteinuria Worsening function Plans for dialysis to begin this week  Scheduled now for tunneled dialysis catheter placement  Using [facility] as transport   Past Medical History:  Diagnosis Date  . Cataract    right eye  . Depression   . Diabetes mellitus 1986    poorly controlled last hemoglobin A1c  10.5 on January 01, 2010, increase since June 2011 when hemoglobin A1c was 9.3 , determined to be most likely secondary to non-compliance.  . Diabetic nephropathy ([place])   . Diabetic retinopathy   . Glaucoma   . Hyperlipidemia   . Hypertension   . Nausea and vomiting   . Retinopathy     Past Surgical History:  Procedure Laterality Date  . ABDOMINAL HYSTERECTOMY   2004    Allergies: Doxycycline and Victoza [liraglutide]  Medications: Prior to Admission medications   Medication Sig Start Date End Date Taking? Authorizing Provider  amLODipine (NORVASC) 10 MG tablet Take 1 tablet (10 mg total) by mouth daily. 11/02/17   Ladell Pier, MD  atorvastatin (LIPITOR) 20 MG tablet Take 1 tablet (20 mg total) by mouth daily. 01/04/18   Ladell Pier, MD  Blood Glucose Monitoring Suppl (TRUE METRIX METER) w/Device KIT 1 each by Does not apply route as needed. 01/04/18   Ladell Pier, MD  carvedilol (COREG) 25 MG tablet Take 1.5 tablets (37.5 mg total) by mouth 2 (two) times daily with a meal. 04/06/18   Ladell Pier, MD  cetirizine (ZYRTEC) 10 MG tablet Take 1 tablet (10 mg total) by mouth daily. Patient not taking: Reported on 09/09/2018 02/17/17   Ladell Pier, MD  cloNIDine  (CATAPRES - DOSED IN MG/24 HR) 0.1 mg/24hr patch PLACE 1 PATCH (0.1 MG TOTAL) ONTO THE SKIN ONCE A WEEK. Patient taking differently: Place 0.1 mg onto the skin every Sunday.  08/18/18   Elayne Snare, MD  cyclobenzaprine (FLEXERIL) 10 MG tablet TAKE 1 TABLET BY MOUTH DAILY AS NEEDED FOR MUSCLE SPASMS. Patient taking differently: Take 10 mg by mouth 3 (three) times daily as needed for muscle spasms.  08/19/18   Ladell Pier, MD  glucose blood (TRUE METRIX BLOOD GLUCOSE TEST) test strip 1 each by Other route 3 (three) times daily. 10/20/14   Boykin Nearing, MD  glucose blood test strip Use as instructed 01/25/18   Ladell Pier, MD  hydrALAZINE (APRESOLINE) 25 MG tablet Take 1 tablet (25 mg total) by mouth 3 (three) times daily. 09/01/18   Ladell Pier, MD  insulin aspart (NOVOLOG FLEXPEN) 100 UNIT/ML FlexPen Inject 10-14 Units into the skin 3 (three) times daily with meals. INJECT 10-14 UNITS UNDER THE SKIN 3 TIMES DAILY WITH MEALS. 02/01/18   Elayne Snare, MD  Insulin Glargine (BASAGLAR KWIKPEN) 100 UNIT/ML SOPN INJECT 50 UNITS INTO THE SKIN DAILY Patient taking differently: Inject 38 Units into the skin at bedtime.  06/21/18   Elayne Snare, MD  Insulin Syringe-Needle U-100 (INSULIN SYRINGE 1CC/31GX5/16") 31G X 5/16" 1 ML MISC Use as directed.  BD 28m x 31G needles 01/22/17   JLadell Pier MD  Insulin Syringes, Disposable, U-100 0.5 ML MISC  1 each by Does not apply route 2 (two) times daily. 01/30/14   Funches, Adriana Mccallum, MD  metoCLOPramide (REGLAN) 5 MG tablet Take 1 tablet (5 mg total) by mouth 3 (three) times daily before meals. Patient not taking: Reported on 09/09/2018 01/04/18   Ladell Pier, MD  ondansetron (ZOFRAN ODT) 4 MG disintegrating tablet Take 1 tablet (4 mg total) by mouth every 8 (eight) hours as needed for nausea or vomiting. 09/09/18   Antonietta Breach, PA-C  TRUEPLUS LANCETS 28G MISC 1 each by Does not apply route 3 (three) times daily. 01/30/14   Funches, Adriana Mccallum, MD   TRUEPLUS PEN NEEDLES 31G X 5 MM MISC USE AS DIRECTED 03/09/18   Ladell Pier, MD  zolpidem (AMBIEN) 5 MG tablet Take 1 tablet (5 mg total) by mouth at bedtime as needed. Each prescription to last a month. Patient taking differently: Take 5 mg by mouth at bedtime as needed for sleep. Each prescription to last a month. 06/04/17   Ladell Pier, MD     Family History  Problem Relation Age of Onset  . Diabetes Mother   . Stroke Mother   . Hypertension Mother   . Alcohol abuse Mother   . Depression Father        Committed suicide  . Alcohol abuse Maternal Uncle     Social History   Socioeconomic History  . Marital status: Single    Spouse name: Not on file  . Number of children: Not on file  . Years of education: Not on file  . Highest education level: Not on file  Occupational History  . Not on file  Social Needs  . Financial resource strain: Not on file  . Food insecurity    Worry: Not on file    Inability: Not on file  . Transportation needs    Medical: Not on file    Non-medical: Not on file  Tobacco Use  . Smoking status: Never Smoker  . Smokeless tobacco: Never Used  Substance and Sexual Activity  . Alcohol use: No  . Drug use: No  . Sexual activity: Not Currently  Lifestyle  . Physical activity    Days per week: Not on file    Minutes per session: Not on file  . Stress: Not on file  Relationships  . Social Herbalist on phone: Not on file    Gets together: Not on file    Attends religious service: Not on file    Active member of club or organization: Not on file    Attends meetings of clubs or organizations: Not on file    Relationship status: Not on file  Other Topics Concern  . Not on file  Social History Narrative  . Not on file    Review of Systems: A 12 point ROS discussed and pertinent positives are indicated in the HPI above.  All other systems are negative.  Review of Systems  Constitutional: Positive for activity change,  appetite change and fatigue. Negative for fever.  Respiratory: Positive for shortness of breath. Negative for cough.   Cardiovascular: Negative for chest pain.  Gastrointestinal: Positive for nausea and vomiting.  Neurological: Positive for weakness.  Psychiatric/Behavioral: Negative for behavioral problems and confusion.    Vital Signs: BP (!) 195/83   Pulse 86   Temp 97.9 F (36.6 C) (Oral)   Resp 20   LMP 03/13/2002   SpO2 99%   Physical Exam Vitals signs reviewed.  Cardiovascular:  Rate and Rhythm: Normal rate and regular rhythm.     Heart sounds: Normal heart sounds.  Pulmonary:     Breath sounds: Normal breath sounds.  Abdominal:     Palpations: Abdomen is soft.     Tenderness: There is no abdominal tenderness.  Musculoskeletal: Normal range of motion.  Skin:    General: Skin is warm and dry.  Neurological:     Mental Status: She is alert and oriented to person, place, and time.  Psychiatric:        Mood and Affect: Mood normal.        Behavior: Behavior normal.        Thought Content: Thought content normal.        Judgment: Judgment normal.     Imaging: No results found.  Labs:  CBC: Recent Labs    09/08/18 1548 09/09/18 0349  WBC 9.2  --   HGB 10.4* 8.8*  HCT 31.6* 26.0*  PLT 136*  --     COAGS: No results for input(s): INR, APTT in the last 8760 hours.  BMP: Recent Labs    09/08/18 1548 09/09/18 0108 09/09/18 0349  NA 138 137 138  K 4.9 4.9 4.7  CL 106 110  --   CO2 14* 13*  --   GLUCOSE 299* 270*  --   BUN 98* 99*  --   CALCIUM 7.4* 6.8*  --   CREATININE 6.53* 6.20*  --   GFRNONAA 7* 7*  --   GFRAA 8* 8*  --     LIVER FUNCTION TESTS: Recent Labs    09/08/18 1548  BILITOT 1.3*  AST 14*  ALT 23  ALKPHOS 108  PROT 4.9*  ALBUMIN 2.8*    TUMOR MARKERS: No results for input(s): AFPTM, CEA, CA199, CHROMGRNA in the last 8760 hours.  Assessment and Plan:  CKD 5 Worsening function Diabetic nephropathy To start  dialysis this week Scheduled for tunneled dialysis catheter placement Risks and benefits discussed with the patient including, but not limited to bleeding, infection, vascular injury, pneumothorax which may require chest tube placement, air embolism or even death  All of the patient's questions were answered, patient is agreeable to proceed. Consent signed and in chart.   Thank you for this interesting consult.  I greatly enjoyed meeting Ann Bryan and look forward to participating in their care.  A copy of this report was sent to the requesting provider on this date.  Electronically Signed: Lavonia Drafts, PA-C 11/15/2018, 7:38 AM   I spent a total of  30 Minutes   in face to face in clinical consultation, greater than 50% of which was counseling/coordinating care for HD cath placement

## 2018-11-18 ENCOUNTER — Other Ambulatory Visit: Payer: Self-pay | Admitting: Internal Medicine

## 2018-11-18 ENCOUNTER — Ambulatory Visit: Payer: Self-pay | Attending: Internal Medicine | Admitting: Internal Medicine

## 2018-11-18 ENCOUNTER — Telehealth: Payer: Self-pay | Admitting: Internal Medicine

## 2018-11-18 ENCOUNTER — Other Ambulatory Visit: Payer: Self-pay

## 2018-11-18 ENCOUNTER — Encounter: Payer: Self-pay | Admitting: Internal Medicine

## 2018-11-18 VITALS — BP 170/80 | HR 89 | Temp 98.5°F | Resp 16 | Ht 60.0 in | Wt 172.2 lb

## 2018-11-18 DIAGNOSIS — I10 Essential (primary) hypertension: Secondary | ICD-10-CM

## 2018-11-18 DIAGNOSIS — D631 Anemia in chronic kidney disease: Secondary | ICD-10-CM | POA: Insufficient documentation

## 2018-11-18 DIAGNOSIS — Z992 Dependence on renal dialysis: Secondary | ICD-10-CM

## 2018-11-18 DIAGNOSIS — E1165 Type 2 diabetes mellitus with hyperglycemia: Secondary | ICD-10-CM

## 2018-11-18 DIAGNOSIS — IMO0002 Reserved for concepts with insufficient information to code with codable children: Secondary | ICD-10-CM

## 2018-11-18 DIAGNOSIS — E785 Hyperlipidemia, unspecified: Secondary | ICD-10-CM

## 2018-11-18 DIAGNOSIS — N186 End stage renal disease: Secondary | ICD-10-CM | POA: Insufficient documentation

## 2018-11-18 DIAGNOSIS — E1121 Type 2 diabetes mellitus with diabetic nephropathy: Secondary | ICD-10-CM

## 2018-11-18 DIAGNOSIS — E1169 Type 2 diabetes mellitus with other specified complication: Secondary | ICD-10-CM

## 2018-11-18 DIAGNOSIS — Z1231 Encounter for screening mammogram for malignant neoplasm of breast: Secondary | ICD-10-CM

## 2018-11-18 DIAGNOSIS — D649 Anemia, unspecified: Secondary | ICD-10-CM

## 2018-11-18 LAB — GLUCOSE, POCT (MANUAL RESULT ENTRY): POC Glucose: 161 mg/dl — AB (ref 70–99)

## 2018-11-18 LAB — POCT GLYCOSYLATED HEMOGLOBIN (HGB A1C): HbA1c, POC (controlled diabetic range): 7.3 % — AB (ref 0.0–7.0)

## 2018-11-18 MED ORDER — HYDRALAZINE HCL 50 MG PO TABS: 50.0000 mg | ORAL_TABLET | Freq: Three times a day (TID) | ORAL | 6 refills | Status: DC

## 2018-11-18 MED ORDER — TRUE METRIX METER W/DEVICE KIT: 1.0000 | PACK | 0 refills | Status: AC | PRN

## 2018-11-18 MED FILL — $novoLOG FLEXPEN SYRINGE: 100 | 14 days supply | Qty: 6 | Fill #3

## 2018-11-18 MED FILL — RENA-VITE TABLET: 30 days supply | Qty: 30 | Fill #0

## 2018-11-18 MED FILL — !TRUE METRIX BLOOD GLUCOSE: 365 days supply | Qty: 1 | Fill #0

## 2018-11-18 MED FILL — ?BASAGLAR 100 UNITS/ML KWPE: 100 | 30 days supply | Qty: 15 | Fill #3

## 2018-11-18 MED FILL — ?AMLODIPINE BESYLATE 10 MG: 10 | 30 days supply | Qty: 30 | Fill #0

## 2018-11-18 NOTE — Progress Notes (Signed)
Patient ID: Ann Bryan, female    DOB: 02/23/1960  MRN: [id number]  CC: Diabetes and Hypertension   Subjective: Ann Bryan is a 58 y.o. female who presents for chronic ds management Her concerns today include:  Patient with history of DM, HTN, HL, seasonal allergies,anxiety/depression, glucoma,nephrotic range proteinuriaand insomnia.  She has progressed to ESRD since we last spoke 03/2018.  She states that for several weeks she was feeling bad, weak, short of breath, vomiting and had a metallic taste in her mouth. Started HD today and will see vascular specialist later this mth to have graft placed.  She is feeling better since having the first hemodialysis session today. Told to stop Ergocalciferol, and Clonidine patch by the nephrologist at dialysis center today Started On Renavite one daily Of note she is also found to have normocytic anemia that is worsened over the past few months.  She thinks she may have received iron during hemodialysis today. She applied for disability and Medicare 1 mth ago  HTN:  Told to stop Clonidine patch.  Hydralazine increased to 50 mg TID, Norvasc, Coreg continued.   No home device to check blood pressure.  She tries to limit salt in the foods.  DM:  Monitor not working so not checking BS.  Reports she was not feeling well for several weeks prior to having HD today Not taking insulin consistently because some days she was having vomiting.  Now that she is feeling better, she plans to restart her insulin regimen Some blurred vision.  Overdue for eye exam but is uninsured at this time  HM:  Reports she got flu shot from her nephrologist  Patient Active Problem List   Diagnosis Date Noted   Diabetes mellitus without complication ([place]) 16/24/4695   Nausea and vomiting 09/28/2017   Insomnia 06/05/2017   CKD (chronic kidney disease) stage 4, GFR 15-29 ml/min ([facility]) 06/05/2017   Chronic seasonal allergic rhinitis 12/10/2015   Arthralgia of  both hands 12/10/2015   Severe episode of recurrent major depressive disorder, without psychotic features ([place]) 12/10/2015   NEPHROPATHY, DIABETIC 09/23/2006   Essential hypertension 12/23/2005   DM (diabetes mellitus) type II uncontrolled with eye manifestation ([place]) 01/07/1984     Current Outpatient Medications on File Prior to Visit  Medication Sig Dispense Refill   atorvastatin (LIPITOR) 20 MG tablet Take 1 tablet (20 mg total) by mouth daily. 30 tablet 6   carvedilol (COREG) 25 MG tablet Take 1.5 tablets (37.5 mg total) by mouth 2 (two) times daily with a meal. 180 tablet 2   cyclobenzaprine (FLEXERIL) 10 MG tablet TAKE 1 TABLET BY MOUTH DAILY AS NEEDED FOR MUSCLE SPASMS. (Patient taking differently: Take 10 mg by mouth 3 (three) times daily as needed for muscle spasms. ) 10 tablet 0   glucose blood (TRUE METRIX BLOOD GLUCOSE TEST) test strip 1 each by Other route 3 (three) times daily. 100 each 11   glucose blood test strip Use as instructed 100 each 12   hydrALAZINE (APRESOLINE) 25 MG tablet Take 1 tablet (25 mg total) by mouth 3 (three) times daily. 90 tablet 5   insulin aspart (NOVOLOG FLEXPEN) 100 UNIT/ML FlexPen Inject 10-14 Units into the skin 3 (three) times daily with meals. INJECT 10-14 UNITS UNDER THE SKIN 3 TIMES DAILY WITH MEALS. 5 pen 3   Insulin Glargine (BASAGLAR KWIKPEN) 100 UNIT/ML SOPN INJECT 50 UNITS INTO THE SKIN DAILY (Patient taking differently: Inject 38 Units into the skin at bedtime. ) 15 mL  3   Insulin Syringe-Needle U-100 (INSULIN SYRINGE 1CC/31GX5/16") 31G X 5/16" 1 ML MISC Use as directed.  BD 38m x 31G needles 100 each 6   Insulin Syringes, Disposable, U-100 0.5 ML MISC 1 each by Does not apply route 2 (two) times daily. 100 each 8   metoCLOPramide (REGLAN) 5 MG tablet Take 1 tablet (5 mg total) by mouth 3 (three) times daily before meals. (Patient not taking: Reported on 09/09/2018) 90 tablet 1   ondansetron (ZOFRAN ODT) 4 MG disintegrating  tablet Take 1 tablet (4 mg total) by mouth every 8 (eight) hours as needed for nausea or vomiting. 10 tablet 0   TRUEPLUS LANCETS 28G MISC 1 each by Does not apply route 3 (three) times daily. 100 each 12   TRUEPLUS PEN NEEDLES 31G X 5 MM MISC USE AS DIRECTED 100 each 6   zolpidem (AMBIEN) 5 MG tablet Take 1 tablet (5 mg total) by mouth at bedtime as needed. Each prescription to last a month. (Patient taking differently: Take 5 mg by mouth at bedtime as needed for sleep. Each prescription to last a month.) 12 tablet 1   No current facility-administered medications on file prior to visit.     Allergies  Allergen Reactions   Doxycycline     REACTION: N/V   Victoza [Liraglutide] Nausea And Vomiting    Social History   Socioeconomic History   Marital status: Single    Spouse name: Not on file   Number of children: Not on file   Years of education: Not on file   Highest education level: Not on file  Occupational History   Not on file  Social Needs   Financial resource strain: Not on file   Food insecurity    Worry: Not on file    Inability: Not on file   Transportation needs    Medical: Not on file    Non-medical: Not on file  Tobacco Use   Smoking status: Never Smoker   Smokeless tobacco: Never Used  Substance and Sexual Activity   Alcohol use: No   Drug use: No   Sexual activity: Not Currently  Lifestyle   Physical activity    Days per week: Not on file    Minutes per session: Not on file   Stress: Not on file  Relationships   Social connections    Talks on phone: Not on file    Gets together: Not on file    Attends religious service: Not on file    Active member of club or organization: Not on file    Attends meetings of clubs or organizations: Not on file    Relationship status: Not on file   Intimate partner violence    Fear of current or ex partner: Not on file    Emotionally abused: Not on file    Physically abused: Not on file     Forced sexual activity: Not on file  Other Topics Concern   Not on file  Social History Narrative   Not on file    Family History  Problem Relation Age of Onset   Diabetes Mother    Stroke Mother    Hypertension Mother    Alcohol abuse Mother    Depression Father        Committed suicide   Alcohol abuse Maternal Uncle     Past Surgical History:  Procedure Laterality Date   ABDOMINAL HYSTERECTOMY   2004   IR FLUORO GUIDE CV LINE RIGHT  11/15/2018   IR US GUIDE VASC ACCESS RIGHT  11/15/2018    ROS: Review of Systems Negative except as stated above  PHYSICAL EXAM: BP (!) 170/80    Pulse 89    Temp 98.5 F (36.9 C) (Oral)    Resp 16    Ht 5' (1.524 m)    Wt 172 lb 3.2 oz (78.1 kg)    LMP 03/13/2002    SpO2 98%    BMI 33.63 kg/m   Physical Exam  General appearance - alert, middle-aged African-American female who appears chronically ill  Mental status - normal mood, behavior, speech, dress, motor activity, and thought processes Eyes - pupils equal and reactive, extraocular eye movements intact Mouth - mucous membranes moist, pharynx normal without lesions Neck - supple, no significant adenopathy Chest - clear to auscultation, no wheezes, rales or rhonchi, symmetric air entry Heart - normal rate, regular rhythm, normal S1, S2, no murmurs, rubs, clicks or gallops Extremities -1+ lower extremity edema Diabetic Foot Exam - Simple   Simple Foot Form Visual Inspection No deformities, no ulcerations, no other skin breakdown bilaterally: Yes Sensation Testing Intact to touch and monofilament testing bilaterally: Yes Pulse Check Posterior Tibialis and Dorsalis pulse intact bilaterally: Yes Comments     CMP Latest Ref Rng & Units 11/15/2018 09/09/2018 09/09/2018  Glucose 70 - 99 mg/dL 225(H) - 270(H)  BUN 6 - 20 mg/dL 88(H) - 99(H)  Creatinine 0.44 - 1.00 mg/dL 11.30(H) - 6.20(H)  Sodium 135 - 145 mmol/L 142 138 137  Potassium 3.5 - 5.1 mmol/L 5.2(H) 4.7 4.9  Chloride  98 - 111 mmol/L 114(H) - 110  CO2 22 - 32 mmol/L 15(L) - 13(L)  Calcium 8.9 - 10.3 mg/dL 7.6(L) - 6.8(L)  Total Protein 6.5 - 8.1 g/dL - - -  Total Bilirubin 0.3 - 1.2 mg/dL - - -  Alkaline Phos 38 - 126 U/L - - -  AST 15 - 41 U/L - - -  ALT 0 - 44 U/L - - -   Lipid Panel     Component Value Date/Time   CHOL 206 (H) 01/22/2017 1235   TRIG 89 01/22/2017 1235   HDL 55 01/22/2017 1235   CHOLHDL 3.7 01/22/2017 1235   CHOLHDL 3.2 12/10/2015 1145   VLDL 12 12/10/2015 1145   LDLCALC 133 (H) 01/22/2017 1235    CBC    Component Value Date/Time   WBC 7.3 11/15/2018 0752   RBC 3.11 (L) 11/15/2018 0752   HGB 8.4 (L) 11/15/2018 0752   HGB 12.0 11/02/2017 1651   HCT 26.9 (L) 11/15/2018 0752   HCT 36.6 11/02/2017 1651   PLT 226 11/15/2018 0752   PLT 298 11/02/2017 1651   MCV 86.5 11/15/2018 0752   MCV 85 11/02/2017 1651   MCH 27.0 11/15/2018 0752   MCHC 31.2 11/15/2018 0752   RDW 14.1 11/15/2018 0752   RDW 13.1 11/02/2017 1651   LYMPHSABS 3.5 10/08/2010 1612   MONOABS 0.3 10/08/2010 1612   EOSABS 0.1 10/08/2010 1612   BASOSABS 0.0 10/08/2010 1612   Results for orders placed or performed in visit on 11/18/18  Glucose (CBG)  Result Value Ref Range   POC Glucose 161 (A) 70 - 99 mg/dl  HgB A1c  Result Value Ref Range   Hemoglobin A1C     HbA1c POC (<> result, manual entry)     HbA1c, POC (prediabetic range)     HbA1c, POC (controlled diabetic range) 7.3 (A) 0.0 - 7.0 %  ASSESSMENT AND PLAN: 1. Uncontrolled type 2 diabetes mellitus with nephropathy ([place]) I have sent prescription to the pharmacy for her to get a new meter so that she can start checking blood sugars at least twice a day before meals.  This way she will better know how to adjust adjust her insulins.  A1c today is much improved however it may not be as accurate in patients with anemia of chronic kidney disease so we will check a fructosamine level also.  Healthy eating habits encouraged -We will refer for eye exam  once she has Medicare - Glucose (CBG) - HgB A1c - Microalbumin/Creatinine Ratio, Urine - Lipid panel - Blood Glucose Monitoring Suppl (TRUE METRIX METER) w/Device KIT; 1 each by Does not apply route as needed.  Dispense: 1 kit; Refill: 0 - Fructosamine  2. Essential hypertension Not at goal.  I did not make any changes to her blood pressure medications since the hydralazine was recently increased to 50 mg 3 times a day by the nephrologist.  3. ESRD (end stage renal disease) on dialysis [facility] Recently started on hemodialysis.  4. Encounter for screening mammogram for malignant neoplasm of breast - MM Digital Screening; Future  5. Normocytic anemia Likely anemia of chronic kidney disease.  I have told her to inquire at her next hemodialysis session whether she received iron -We will get her up-to-date with colon cancer screening when she has Medicare - Iron, TIBC and Ferritin Panel  6. Hyperlipidemia associated with type 2 diabetes mellitus ([place]) - Lipid panel - Hepatic Function Panel  Patient asked to inquire from her nephrologist whether or not she received the flu vaccine so that we can update her health maintenance.  She thinks that she did receive it.  Patient was given the opportunity to ask questions.  Patient verbalized understanding of the plan and was able to repeat key elements of the plan.   Orders Placed This Encounter  Procedures   MM Digital Screening   Microalbumin/Creatinine Ratio, Urine   Lipid panel   Iron, TIBC and Ferritin Panel   Hepatic Function Panel   Fructosamine   Glucose (CBG)   HgB A1c     Requested Prescriptions   Signed Prescriptions Disp Refills   Blood Glucose Monitoring Suppl (TRUE METRIX METER) w/Device KIT 1 kit 0    Sig: 1 each by Does not apply route as needed.    Return in about 2 months (around 01/18/2019).  Karle Plumber, MD, FACP

## 2018-11-18 NOTE — Telephone Encounter (Signed)
Please, call patient she wants to know if you can send her a copy of her taxes by e-mail . Thanks

## 2018-11-18 NOTE — Patient Instructions (Signed)
Please find out from your dialysis center whether you are receiving medication during dialysis to help improve the anemia.  Take your blood pressure medications every day as prescribed.  Try to check your blood sugars daily.

## 2018-11-18 NOTE — Telephone Encounter (Signed)
I called and LVM that I will be sending a copy of the taxes

## 2018-11-19 LAB — LIPID PANEL
Chol/HDL Ratio: 3.9 ratio (ref 0.0–4.4)
Cholesterol, Total: 136 mg/dL (ref 100–199)
HDL: 35 mg/dL — ABNORMAL LOW (ref >39)
LDL Chol Calc (NIH): 73 mg/dL (ref 0–99)
Triglycerides: 164 mg/dL — ABNORMAL HIGH (ref 0–149)
VLDL Cholesterol Cal: 28 mg/dL (ref 5–40)

## 2018-11-19 LAB — HEPATIC FUNCTION PANEL
ALT: 5 IU/L (ref 0–32)
AST: 11 IU/L (ref 0–40)
Albumin: 3.7 g/dL — ABNORMAL LOW (ref 3.8–4.9)
Alkaline Phosphatase: 115 IU/L (ref 39–117)
Bilirubin Total: 0.3 mg/dL (ref 0.0–1.2)
Bilirubin, Direct: 0.1 mg/dL (ref 0.00–0.40)
Total Protein: 5.8 g/dL — ABNORMAL LOW (ref 6.0–8.5)

## 2018-11-19 LAB — IRON,TIBC AND FERRITIN PANEL
Ferritin: 331 ng/mL — ABNORMAL HIGH (ref 15–150)
Iron Saturation: 90 % (ref 15–55)
Iron: 193 ug/dL — ABNORMAL HIGH (ref 27–159)
Total Iron Binding Capacity: 215 ug/dL — ABNORMAL LOW (ref 250–450)
UIBC: 22 ug/dL — ABNORMAL LOW (ref 131–425)

## 2018-11-19 LAB — MICROALBUMIN / CREATININE URINE RATIO
Creatinine, Urine: 182.8 mg/dL
Microalb/Creat Ratio: 4081 mg/g creat — ABNORMAL HIGH (ref 0–29)
Microalbumin, Urine: 7459.6 ug/mL

## 2018-12-01 ENCOUNTER — Other Ambulatory Visit: Payer: Self-pay

## 2018-12-01 ENCOUNTER — Telehealth (HOSPITAL_COMMUNITY): Payer: Self-pay | Admitting: *Deleted

## 2018-12-01 DIAGNOSIS — Z992 Dependence on renal dialysis: Secondary | ICD-10-CM

## 2018-12-01 DIAGNOSIS — N186 End stage renal disease: Secondary | ICD-10-CM

## 2018-12-01 NOTE — Telephone Encounter (Signed)

## 2018-12-06 ENCOUNTER — Encounter: Payer: Self-pay | Admitting: Surgery

## 2018-12-06 ENCOUNTER — Other Ambulatory Visit: Payer: Self-pay | Admitting: *Deleted

## 2018-12-06 ENCOUNTER — Other Ambulatory Visit: Payer: Self-pay

## 2018-12-06 ENCOUNTER — Telehealth: Payer: Self-pay | Admitting: *Deleted

## 2018-12-06 ENCOUNTER — Encounter: Payer: Self-pay | Admitting: *Deleted

## 2018-12-06 ENCOUNTER — Ambulatory Visit (INDEPENDENT_AMBULATORY_CARE_PROVIDER_SITE_OTHER)
Admission: RE | Admit: 2018-12-06 | Discharge: 2018-12-06 | Disposition: A | Discharge status: Home / Self Care | Payer: Medicaid Other | Source: Ambulatory Visit | Attending: Family | Admitting: Family

## 2018-12-06 ENCOUNTER — Ambulatory Visit (INDEPENDENT_AMBULATORY_CARE_PROVIDER_SITE_OTHER): Payer: Self-pay | Admitting: Surgery

## 2018-12-06 ENCOUNTER — Ambulatory Visit (HOSPITAL_COMMUNITY)
Admission: RE | Admit: 2018-12-06 | Discharge: 2018-12-06 | Disposition: A | Discharge status: Home / Self Care | Payer: Medicaid Other | Source: Ambulatory Visit | Attending: Family | Admitting: Family

## 2018-12-06 VITALS — BP 147/70 | HR 68 | Temp 97.7°F | Resp 18 | Ht 60.0 in | Wt 160.0 lb

## 2018-12-06 DIAGNOSIS — Z992 Dependence on renal dialysis: Secondary | ICD-10-CM

## 2018-12-06 DIAGNOSIS — N186 End stage renal disease: Secondary | ICD-10-CM

## 2018-12-06 NOTE — Progress Notes (Signed)
[facility] [place]  Patient name: Ann Bryan MRN: [id number] DOB: 12-20-1960 Sex: female   REQUESTING PROVIDER:    Dr. Joelyn Oms   REASON FOR CONSULT:    Dialysis access  HISTORY OF PRESENT ILLNESS:   Ann Bryan is a 58 y.o. female, who is referred today for dialysis access.  She began dialysis through a right sided catheter approximately 2 weeks ago.  She is a Tuesday Thursday Saturday dialysis patient.  She is right-handed.  Her renal failure secondary to diabetes and hypertension.  She denies a history of pacemaker or defibrillator implant.  PAST MEDICAL HISTORY    Past Medical History:  Diagnosis Date   Cataract    right eye   Depression    Diabetes mellitus 1986    poorly controlled last hemoglobin A1c  10.5 on January 01, 2010, increase since June 2011 when hemoglobin A1c was 9.3 , determined to be most likely secondary to non-compliance.   Diabetic nephropathy (HCC)    Diabetic retinopathy    Glaucoma    Hyperlipidemia    Hypertension    Nausea and vomiting    Retinopathy      FAMILY HISTORY   Family History  Problem Relation Age of Onset   Diabetes Mother    Stroke Mother    Hypertension Mother    Alcohol abuse Mother    Depression Father        Committed suicide   Alcohol abuse Maternal Uncle     SOCIAL HISTORY:   Social History   Socioeconomic History   Marital status: Single    Spouse name: Not on file   Number of children: Not on file   Years of education: Not on file   Highest education level: Not on file  Occupational History   Not on file  Social Needs   Financial resource strain: Not on file   Food insecurity    Worry: Not on file    Inability: Not on file   Transportation needs    Medical: Not on file    Non-medical: Not on file  Tobacco Use   Smoking status: Never Smoker   Smokeless tobacco: Never Used  Substance and Sexual Activity    Alcohol use: No   Drug use: No   Sexual activity: Not Currently  Lifestyle   Physical activity    Days per week: Not on file    Minutes per session: Not on file   Stress: Not on file  Relationships   Social connections    Talks on phone: Not on file    Gets together: Not on file    Attends religious service: Not on file    Active member of club or organization: Not on file    Attends meetings of clubs or organizations: Not on file    Relationship status: Not on file   Intimate partner violence    Fear of current or ex partner: Not on file    Emotionally abused: Not on file    Physically abused: Not on file    Forced sexual activity: Not on file  Other Topics Concern   Not on file  Social History Narrative   Not on file    ALLERGIES:    Allergies  Allergen Reactions   Doxycycline     REACTION: N/V   Victoza [Liraglutide] Nausea And Vomiting    CURRENT MEDICATIONS:    Current Outpatient Medications  Medication Sig Dispense Refill   amLODipine ([place])  10 MG tablet TAKE 1 TABLET (10 MG TOTAL) BY MOUTH DAILY. 30 tablet 2   atorvastatin (LIPITOR) 20 MG tablet Take 1 tablet (20 mg total) by mouth daily. 30 tablet 6   Blood Glucose Monitoring Suppl (TRUE METRIX METER) w/Device KIT 1 each by Does not apply route as needed. 1 kit 0   calcitRIOL (ROCALTROL) 0.25 MCG capsule Take 0.25 mcg by mouth daily.     carvedilol (COREG) 25 MG tablet Take 1.5 tablets (37.5 mg total) by mouth 2 (two) times daily with a meal. 180 tablet 2   cyclobenzaprine (FLEXERIL) 10 MG tablet TAKE 1 TABLET BY MOUTH DAILY AS NEEDED FOR MUSCLE SPASMS. (Patient taking differently: Take 10 mg by mouth 3 (three) times daily as needed for muscle spasms. ) 10 tablet 0   glucose blood (TRUE METRIX BLOOD GLUCOSE TEST) test strip 1 each by Other route 3 (three) times daily. 100 each 11   glucose blood test strip Use as instructed 100 each 12   hydrALAZINE (APRESOLINE) 50 MG tablet Take 1  tablet (50 mg total) by mouth 3 (three) times daily. 90 tablet 6   insulin aspart (NOVOLOG FLEXPEN) 100 UNIT/ML FlexPen Inject 10-14 Units into the skin 3 (three) times daily with meals. INJECT 10-14 UNITS UNDER THE SKIN 3 TIMES DAILY WITH MEALS. 5 pen 3   Insulin Glargine (BASAGLAR KWIKPEN) 100 UNIT/ML SOPN INJECT 50 UNITS INTO THE SKIN DAILY (Patient taking differently: Inject 38 Units into the skin at bedtime. ) 15 mL 3   Insulin Syringe-Needle U-100 (INSULIN SYRINGE 1CC/31GX5/16") 31G X 5/16" 1 ML MISC Use as directed.  BD 65m x 31G needles 100 each 6   Insulin Syringes, Disposable, U-100 0.5 ML MISC 1 each by Does not apply route 2 (two) times daily. 100 each 8   metoCLOPramide (REGLAN) 5 MG tablet Take 1 tablet (5 mg total) by mouth 3 (three) times daily before meals. 90 tablet 1   ondansetron (ZOFRAN ODT) 4 MG disintegrating tablet Take 1 tablet (4 mg total) by mouth every 8 (eight) hours as needed for nausea or vomiting. 10 tablet 0   TRUEPLUS LANCETS 28G MISC 1 each by Does not apply route 3 (three) times daily. 100 each 12   TRUEPLUS PEN NEEDLES 31G X 5 MM MISC USE AS DIRECTED 100 each 6   Vitamin D, Ergocalciferol, (DRISDOL) 1.25 MG (50000 UT) CAPS capsule Take 50,000 Units by mouth once a week.     zolpidem (AMBIEN) 5 MG tablet Take 1 tablet (5 mg total) by mouth at bedtime as needed. Each prescription to last a month. (Patient taking differently: Take 5 mg by mouth at bedtime as needed for sleep. Each prescription to last a month.) 12 tablet 1   No current facility-administered medications for this visit.     REVIEW OF SYSTEMS:   '[X]'  denotes positive finding, '[ ]'  denotes negative finding Cardiac  Comments:  Chest pain or chest pressure:    Shortness of breath upon exertion:    Short of breath when lying flat:    Irregular heart rhythm:        Vascular    Pain in calf, thigh, or hip brought on by ambulation:    Pain in feet at night that wakes you up from your sleep:      Blood clot in your veins:    Leg swelling:         Pulmonary    Oxygen at home:    Productive cough:  Wheezing:         Neurologic    Sudden weakness in arms or legs:     Sudden numbness in arms or legs:     Sudden onset of difficulty speaking or slurred speech:    Temporary loss of vision in one eye:     Problems with dizziness:         Gastrointestinal    Blood in stool:      Vomited blood:         Genitourinary    Burning when urinating:     Blood in urine:        Psychiatric    Major depression:         Hematologic    Bleeding problems:    Problems with blood clotting too easily:        Skin    Rashes or ulcers:        Constitutional    Fever or chills:     PHYSICAL EXAM:   Vitals:   12/06/18 1415  BP: (!) 147/70  Pulse: 68  Resp: 18  Temp: 97.7 F (36.5 C)  SpO2: 96%  Weight: 160 lb (72.6 kg)  Height: 5' (1.524 m)    GENERAL: The patient is a well-nourished female, in no acute distress. The vital signs are documented above. CARDIAC: There is a regular rate and rhythm.  VASCULAR: Palpable left brachial and radial pulse PULMONARY: Nonlabored respirations MUSCULOSKELETAL: There are no major deformities or cyanosis. NEUROLOGIC: No focal weakness or paresthesias are detected. SKIN: There are no ulcers or rashes noted. PSYCHIATRIC: The patient has a normal affect.  STUDIES:   I have reviewed her vascular studies with the following findings: ArterRight Pre-Dialysis Findings: +-----------------------+----------+--------------------+---------+--------+  Location                PSV (cm/s) Intralum. Diam. (cm) Waveform  Comments  +-----------------------+----------+--------------------+---------+--------+  Brachial Antecub. fossa 98         0.42                 triphasic           +-----------------------+----------+--------------------+---------+--------+  Radial Art at Wrist     109        0.21                 triphasic            +-----------------------+----------+--------------------+---------+--------+  Ulnar Art at Wrist      95         0.18                 triphasic           +-----------------------+----------+--------------------+---------+--------+  +------------+-----------------+  Mccrackin's Test Branches at fossa  +------------+-----------------+  Left Pre-Dialysis Findings: +-----------------------+----------+--------------------+---------+--------+  Location                PSV (cm/s) Intralum. Diam. (cm) Waveform  Comments  +-----------------------+----------+--------------------+---------+--------+  Brachial Antecub. fossa 112        0.39                 triphasic           +-----------------------+----------+--------------------+---------+--------+  Radial Art at Wrist     124        0.23                 triphasic           +-----------------------+----------+--------------------+---------+--------+  Ulnar Art at Wrist  71         0.13                 triphasic           +-----------------------+----------+--------------------+---------+--------+  +-----------------+-------------+----------+---------+  Right Cephalic    Diameter (cm) Depth (cm) Findings   +-----------------+-------------+----------+---------+  Shoulder              0.37                            +-----------------+-------------+----------+---------+  Prox upper arm        0.38                            +-----------------+-------------+----------+---------+  Mid upper arm         0.38                            +-----------------+-------------+----------+---------+  Dist upper arm     0.33 / 0.39             branching  +-----------------+-------------+----------+---------+  Antecubital fossa  0.26 / 0.35                        +-----------------+-------------+----------+---------+  Prox forearm       0.37 / 0.23             branching  +-----------------+-------------+----------+---------+  Mid forearm           0.23                             +-----------------+-------------+----------+---------+  Dist forearm          0.22                            +-----------------+-------------+----------+---------+  Wrist                 0.26                            +-----------------+-------------+----------+---------+  +-----------------+------------------+----------+-------------------+  Right Basilic       Diameter (cm)    Depth (cm)      Findings        +-----------------+------------------+----------+-------------------+  Shoulder                                          not visualized     +-----------------+------------------+----------+-------------------+  Prox upper arm                                    not visualized     +-----------------+------------------+----------+-------------------+  Mid upper arm            0.45                   branching and joins  +-----------------+------------------+----------+-------------------+  Dist upper arm    0.41 / 0.38 / 0.52                 branching       +-----------------+------------------+----------+-------------------+  Antecubital fossa  0.30                                        +-----------------+------------------+----------+-------------------+  Prox forearm             0.30                                        +-----------------+------------------+----------+-------------------+  +-----------------+-------------+----------+-----------------+  Left Cephalic     Diameter (cm) Depth (cm)     Findings       +-----------------+-------------+----------+-----------------+  Shoulder              0.29                                    +-----------------+-------------+----------+-----------------+  Prox upper arm     0.33 / 0.20                                +-----------------+-------------+----------+-----------------+  Mid upper arm         0.32                                     +-----------------+-------------+----------+-----------------+  Dist upper arm        0.29                                    +-----------------+-------------+----------+-----------------+  Antecubital fossa     0.30                 branching and NWV  +-----------------+-------------+----------+-----------------+  Prox forearm          0.24                                    +-----------------+-------------+----------+-----------------+  Mid forearm           0.21                                    +-----------------+-------------+----------+-----------------+  Dist forearm          0.23                                    +-----------------+-------------+----------+-----------------+  +-----------------+-------------+----------+---------+  Left Basilic      Diameter (cm) Depth (cm) Findings   +-----------------+-------------+----------+---------+  Prox upper arm     0.50 / 0.44                        +-----------------+-------------+----------+---------+  Mid upper arm      0.34/ 0.43                joins    +-----------------+-------------+----------+---------+  Dist upper arm        0.26  branching  +-----------------+-------------+----------+---------+  Antecubital fossa  0.23 / 0.35             branching  +-----------------+-------------+----------+---------+  Prox forearm          0.19                            +-----------------+-------------+----------+---------+ Should  ASSESSMENT and PLAN   End-stage renal disease: We discussed proceeding with a left brachiocephalic versus first stage basilic vein fistula creation.  I discussed the risk of not maturity as well as the need for additional interventions.  We also discussed the risk of steal syndrome.  All of her questions were answered.  Her procedure has been scheduled for Wednesday, December 16   Leia Alf, MD, [facility] [phone] Pager [phone]

## 2018-12-06 NOTE — Progress Notes (Signed)
Pre-op instructions letter faxed to [place] as requested by patient.

## 2018-12-06 NOTE — Telephone Encounter (Signed)
Pre-op instruction letter received by Caren Griffins at [address] [address] [facility].

## 2018-12-20 ENCOUNTER — Other Ambulatory Visit (HOSPITAL_COMMUNITY)
Admission: RE | Admit: 2018-12-20 | Discharge: 2018-12-20 | Disposition: A | Discharge status: Home / Self Care | Payer: Medicaid Other | Source: Ambulatory Visit | Attending: Surgery | Admitting: Surgery

## 2018-12-20 ENCOUNTER — Encounter (HOSPITAL_COMMUNITY): Payer: Self-pay | Admitting: Surgery

## 2018-12-20 DIAGNOSIS — Z01812 Encounter for preprocedural laboratory examination: Secondary | ICD-10-CM | POA: Diagnosis present

## 2018-12-20 DIAGNOSIS — Z20828 Contact with and (suspected) exposure to other viral communicable diseases: Secondary | ICD-10-CM | POA: Diagnosis not present

## 2018-12-20 LAB — SARS CORONAVIRUS 2 (TAT 6-24 HRS): SARS Coronavirus 2: NEGATIVE

## 2018-12-20 NOTE — Progress Notes (Signed)
Denies chest pain, shob, or cardiology visit. Educated on Environmental manager. See below regarding DM instructions.   How do I manage my blood sugar before surgery? . Check your blood sugar at least 4 times a day, starting 2 days before surgery, to make sure that the level is not too high or low. o Check your blood sugar the morning of your surgery when you wake up and every 2 hours until you get to the Short Stay unit. . If your blood sugar is less than 70 mg/dL, you will need to treat for low blood sugar: o Do not take insulin. o Treat a low blood sugar (less than 70 mg/dL) with  cup of clear juice (cranberry or apple), 4 glucose tablets, OR glucose gel. Recheck blood sugar in 15 minutes after treatment (to make sure it is greater than 70 mg/dL). If your blood sugar is not greater than 70 mg/dL on recheck, call [phone] o  for further instructions. . Report your blood sugar to the short stay nurse when you get to Short Stay.   WHAT DO I DO ABOUT MY DIABETES MEDICATION?   Marland Kitchen Do not take oral diabetes medicines (pills) the morning of surgery.  . THE NIGHT BEFORE SURGERY, take __15_________ units of _Basaglar__________insulin.      . If your CBG is greater than 220 mg/dL, you may take  of your sliding scale (correction) dose of insulin.

## 2018-12-21 ENCOUNTER — Encounter (HOSPITAL_COMMUNITY): Payer: Self-pay

## 2018-12-21 NOTE — Progress Notes (Signed)
Patient called at 1650 stating that she did not have anyone to be with her after surgery. I contacted office and Dr. Trula Slade was not in office. I attempted to page him. I contacted Dr. Scot Dock who requested we leave a note for Dr. Scot Dock that she will need to be admitted post surgery due to not having someone to be with her.

## 2018-12-22 ENCOUNTER — Ambulatory Visit (HOSPITAL_COMMUNITY): Admission: RE | Admit: 2018-12-22 | Payer: Self-pay | Source: Home / Self Care | Admitting: Surgery

## 2018-12-22 HISTORY — DX: End stage renal disease: N18.6

## 2018-12-22 HISTORY — DX: Anemia, unspecified: D64.9

## 2018-12-22 SURGERY — ARTERIOVENOUS (AV) FISTULA CREATION
Anesthesia: Choice | Laterality: Left

## 2018-12-24 ENCOUNTER — Telehealth: Payer: Self-pay | Admitting: *Deleted

## 2018-12-24 NOTE — Telephone Encounter (Signed)
Call x 2 to patient at number she gave this office. Person states wrong number. Spoke with Hydrographic surveyor at [employer]. Patient canceled surgery scheduled for 12/22/2018. Asked that they speak with patient and have her contact this office to re-schedule.

## 2019-01-03 ENCOUNTER — Telehealth: Payer: Self-pay | Admitting: *Deleted

## 2019-01-03 ENCOUNTER — Other Ambulatory Visit: Payer: Self-pay | Admitting: *Deleted

## 2019-01-03 NOTE — Telephone Encounter (Signed)
Patient called to reschedule surgery for HD access. Instructed to be at [facility] admitting at 6:30 am or as directed by the hospital for 01/21/2019 surgery. NPO past MN night prior. Expect a call and follow the detailed surgery instructions received from the hospital pre-admission department for insulin adjustment, medications and scheduled time for nasal swab for Covid-19. Patient has no family for discharge and will need overnight stay. Indicated on booking. Patient verbalized understanding.

## 2019-01-10 ENCOUNTER — Telehealth: Payer: Self-pay | Admitting: Internal Medicine

## 2019-01-10 NOTE — Telephone Encounter (Signed)
Pt called and reports having chills/vomiting. States her symptoms began 01/07/19 and is having trouble going to her dialysis appointment because of this issue, scheduled with for next available appt on 01/12/19 and is aware to seek an urgent care or the ER if symptoms worsen.

## 2019-01-11 ENCOUNTER — Observation Stay (HOSPITAL_COMMUNITY)
Admission: EM | Admit: 2019-01-11 | Discharge: 2019-01-12 | Disposition: A | Discharge status: Home / Self Care | Payer: Medicaid Other | Attending: Internal Medicine | Admitting: Internal Medicine

## 2019-01-11 ENCOUNTER — Other Ambulatory Visit: Payer: Self-pay

## 2019-01-11 ENCOUNTER — Encounter (HOSPITAL_COMMUNITY): Payer: Self-pay | Admitting: Emergency Medicine

## 2019-01-11 DIAGNOSIS — I10 Essential (primary) hypertension: Secondary | ICD-10-CM | POA: Diagnosis present

## 2019-01-11 DIAGNOSIS — E1122 Type 2 diabetes mellitus with diabetic chronic kidney disease: Secondary | ICD-10-CM | POA: Diagnosis not present

## 2019-01-11 DIAGNOSIS — Z79899 Other long term (current) drug therapy: Secondary | ICD-10-CM | POA: Diagnosis not present

## 2019-01-11 DIAGNOSIS — Z992 Dependence on renal dialysis: Secondary | ICD-10-CM | POA: Diagnosis not present

## 2019-01-11 DIAGNOSIS — E876 Hypokalemia: Principal | ICD-10-CM | POA: Insufficient documentation

## 2019-01-11 DIAGNOSIS — Z794 Long term (current) use of insulin: Secondary | ICD-10-CM | POA: Insufficient documentation

## 2019-01-11 DIAGNOSIS — D631 Anemia in chronic kidney disease: Secondary | ICD-10-CM | POA: Insufficient documentation

## 2019-01-11 DIAGNOSIS — R5381 Other malaise: Secondary | ICD-10-CM | POA: Diagnosis not present

## 2019-01-11 DIAGNOSIS — E11319 Type 2 diabetes mellitus with unspecified diabetic retinopathy without macular edema: Secondary | ICD-10-CM | POA: Insufficient documentation

## 2019-01-11 DIAGNOSIS — I120 Hypertensive chronic kidney disease with stage 5 chronic kidney disease or end stage renal disease: Secondary | ICD-10-CM | POA: Diagnosis not present

## 2019-01-11 DIAGNOSIS — E8889 Other specified metabolic disorders: Secondary | ICD-10-CM | POA: Insufficient documentation

## 2019-01-11 DIAGNOSIS — Z20822 Contact with and (suspected) exposure to covid-19: Secondary | ICD-10-CM | POA: Insufficient documentation

## 2019-01-11 DIAGNOSIS — R110 Nausea: Secondary | ICD-10-CM | POA: Diagnosis present

## 2019-01-11 DIAGNOSIS — E785 Hyperlipidemia, unspecified: Secondary | ICD-10-CM | POA: Diagnosis not present

## 2019-01-11 DIAGNOSIS — N186 End stage renal disease: Secondary | ICD-10-CM | POA: Diagnosis not present

## 2019-01-11 DIAGNOSIS — E875 Hyperkalemia: Secondary | ICD-10-CM

## 2019-01-11 DIAGNOSIS — IMO0002 Reserved for concepts with insufficient information to code with codable children: Secondary | ICD-10-CM | POA: Diagnosis present

## 2019-01-11 DIAGNOSIS — E1165 Type 2 diabetes mellitus with hyperglycemia: Secondary | ICD-10-CM

## 2019-01-11 DIAGNOSIS — E1139 Type 2 diabetes mellitus with other diabetic ophthalmic complication: Secondary | ICD-10-CM | POA: Diagnosis present

## 2019-01-11 LAB — COMPREHENSIVE METABOLIC PANEL
ALT: 88 U/L — ABNORMAL HIGH (ref 0–44)
AST: 28 U/L (ref 15–41)
Albumin: 3.6 g/dL (ref 3.5–5.0)
Alkaline Phosphatase: 218 U/L — ABNORMAL HIGH (ref 38–126)
Anion gap: 22 — ABNORMAL HIGH (ref 5–15)
BUN: 119 mg/dL — ABNORMAL HIGH (ref 6–20)
CO2: 18 mmol/L — ABNORMAL LOW (ref 22–32)
Calcium: 7.6 mg/dL — ABNORMAL LOW (ref 8.9–10.3)
Chloride: 100 mmol/L (ref 98–111)
Creatinine, Ser: 13.54 mg/dL — ABNORMAL HIGH (ref 0.44–1.00)
GFR calc Af Amer: 3 mL/min — ABNORMAL LOW (ref >60)
GFR calc non Af Amer: 3 mL/min — ABNORMAL LOW (ref >60)
Glucose, Bld: 318 mg/dL — ABNORMAL HIGH (ref 70–99)
Potassium: 6.8 mmol/L (ref 3.5–5.1)
Sodium: 140 mmol/L (ref 135–145)
Total Bilirubin: 0.8 mg/dL (ref 0.3–1.2)
Total Protein: 5.9 g/dL — ABNORMAL LOW (ref 6.5–8.1)

## 2019-01-11 LAB — CBC
HCT: 37.5 % (ref 36.0–46.0)
Hemoglobin: 11.4 g/dL — ABNORMAL LOW (ref 12.0–15.0)
MCH: 27.7 pg (ref 26.0–34.0)
MCHC: 30.4 g/dL (ref 30.0–36.0)
MCV: 91 fL (ref 80.0–100.0)
Platelets: 311 10*3/uL (ref 150–400)
RBC: 4.12 MIL/uL (ref 3.87–5.11)
RDW: 15.8 % — ABNORMAL HIGH (ref 11.5–15.5)
WBC: 8.5 10*3/uL (ref 4.0–10.5)
nRBC: 0 % (ref 0.0–0.2)

## 2019-01-11 LAB — CBG MONITORING, ED
Glucose-Capillary: 315 mg/dL — ABNORMAL HIGH (ref 70–99)
Glucose-Capillary: 253 mg/dL — ABNORMAL HIGH (ref 70–99)

## 2019-01-11 LAB — GLUCOSE, CAPILLARY: Glucose-Capillary: 226 mg/dL — ABNORMAL HIGH (ref 70–99)

## 2019-01-11 LAB — RESPIRATORY PANEL BY RT PCR (FLU A&B, COVID)
Influenza A by PCR: NEGATIVE
Influenza B by PCR: NEGATIVE
SARS Coronavirus 2 by RT PCR: NEGATIVE

## 2019-01-11 LAB — HIV ANTIBODY (ROUTINE TESTING W REFLEX): HIV Screen 4th Generation wRfx: NONREACTIVE

## 2019-01-11 LAB — I-STAT BETA HCG BLOOD, ED (MC, WL, AP ONLY): I-stat hCG, quantitative: <5 m[IU]/mL (ref <5)

## 2019-01-11 LAB — LIPASE, BLOOD: Lipase: 34 U/L (ref 11–51)

## 2019-01-11 MED ORDER — BASAGLAR KWIKPEN 100 UNIT/ML ~~LOC~~ SOPN: 30.0000 [IU] | PEN_INJECTOR | Freq: Every day | SUBCUTANEOUS | Status: DC

## 2019-01-11 MED ORDER — CHLORHEXIDINE GLUCONATE CLOTH 2 % EX PADS
6.0000 | MEDICATED_PAD | Freq: Every day | CUTANEOUS | Status: DC
  Administered 2019-01-12: 6 via TOPICAL

## 2019-01-11 MED ORDER — CARVEDILOL 25 MG PO TABS: 37.5000 mg | ORAL_TABLET | Freq: Two times a day (BID) | ORAL | Status: DC

## 2019-01-11 MED ORDER — SODIUM ZIRCONIUM CYCLOSILICATE 5 G PO PACK
5.0000 g | PACK | Freq: Once | ORAL | Status: AC
  Administered 2019-01-11: 19:00:00 5 g via ORAL
  Filled 2019-01-11: qty 1

## 2019-01-11 MED ORDER — ACETAMINOPHEN 325 MG PO TABS: 650.0000 mg | ORAL_TABLET | Freq: Four times a day (QID) | ORAL | Status: DC | PRN

## 2019-01-11 MED ORDER — INSULIN ASPART 100 UNIT/ML ~~LOC~~ SOLN
4.0000 [IU] | Freq: Three times a day (TID) | SUBCUTANEOUS | Status: DC
  Administered 2019-01-12: 4 [IU] via SUBCUTANEOUS

## 2019-01-11 MED ORDER — HEPARIN SODIUM (PORCINE) 5000 UNIT/ML IJ SOLN
5000.0000 [IU] | Freq: Three times a day (TID) | INTRAMUSCULAR | Status: DC
  Administered 2019-01-11 – 2019-01-12 (×2): 5000 [IU] via SUBCUTANEOUS
  Filled 2019-01-11 (×3): qty 1

## 2019-01-11 MED ORDER — ATORVASTATIN CALCIUM 10 MG PO TABS
20.0000 mg | ORAL_TABLET | Freq: Every evening | ORAL | Status: DC
  Administered 2019-01-11: 20 mg via ORAL
  Filled 2019-01-11: qty 2

## 2019-01-11 MED ORDER — INSULIN ASPART 100 UNIT/ML ~~LOC~~ SOLN
0.0000 [IU] | Freq: Three times a day (TID) | SUBCUTANEOUS | Status: DC
  Administered 2019-01-12: 2 [IU] via SUBCUTANEOUS

## 2019-01-11 MED ORDER — AMLODIPINE BESYLATE 10 MG PO TABS
10.0000 mg | ORAL_TABLET | Freq: Every evening | ORAL | Status: DC
  Filled 2019-01-11: qty 2

## 2019-01-11 MED ORDER — RENA-VITE PO TABS
1.0000 | ORAL_TABLET | Freq: Every day | ORAL | Status: DC
  Administered 2019-01-12: 1 via ORAL
  Filled 2019-01-11: qty 1

## 2019-01-11 MED ORDER — INSULIN ASPART 100 UNIT/ML IV SOLN
5.0000 [IU] | Freq: Once | INTRAVENOUS | Status: AC
  Administered 2019-01-11: 20:00:00 5 [IU] via INTRAVENOUS

## 2019-01-11 MED ORDER — ONDANSETRON HCL 4 MG PO TABS: 4.0000 mg | ORAL_TABLET | Freq: Four times a day (QID) | ORAL | Status: DC | PRN

## 2019-01-11 MED ORDER — INSULIN GLARGINE 100 UNIT/ML ~~LOC~~ SOLN
20.0000 [IU] | Freq: Every day | SUBCUTANEOUS | Status: DC
  Administered 2019-01-11: 20 [IU] via SUBCUTANEOUS
  Filled 2019-01-11 (×2): qty 0.2

## 2019-01-11 MED ORDER — ONDANSETRON HCL 4 MG/2ML IJ SOLN
4.0000 mg | Freq: Four times a day (QID) | INTRAMUSCULAR | Status: DC | PRN
  Administered 2019-01-12: 4 mg via INTRAVENOUS
  Filled 2019-01-11: qty 2

## 2019-01-11 MED ORDER — ALBUTEROL SULFATE HFA 108 (90 BASE) MCG/ACT IN AERS
4.0000 | INHALATION_SPRAY | Freq: Once | RESPIRATORY_TRACT | Status: AC
  Administered 2019-01-11: 20:00:00 4 via RESPIRATORY_TRACT
  Filled 2019-01-11: qty 6.7

## 2019-01-11 MED ORDER — ACETAMINOPHEN 650 MG RE SUPP: 650.0000 mg | Freq: Four times a day (QID) | RECTAL | Status: DC | PRN

## 2019-01-11 MED ORDER — SODIUM BICARBONATE 8.4 % IV SOLN
25.0000 meq | Freq: Once | INTRAVENOUS | Status: AC
  Administered 2019-01-11: 25 meq via INTRAVENOUS
  Filled 2019-01-11: qty 50

## 2019-01-11 NOTE — H&P (Signed)
History and Physical    Ann Bryan:[id number] DOB: 09/09/1960 DOA: 01/11/2019  PCP: Ladell Pier, MD  Patient coming from: Home  I have personally briefly reviewed patient's old medical records in [place]  Chief Complaint: Missed 2 dialysis  HPI: Ann Bryan is a 59 y.o. female with medical history significant of DM2, HTN, ESRD on TTS dialysis.  Last dialysis 12/31.  Patient wasn't feeling well over the weekend with URI symptoms and diarrhea.  Called dialysis center who (fearing COVID I suspect) told her not to come in per patient.  Today URI symptoms and diarrhea had improved, but she was feeling overall even worse, dialysis center recd she go to ER.   ED Course: COVID is neg, but patient labs c/w needing dialysis: K 6.8, EKG tall peaked T waves, BUN 119   Review of Systems: As per HPI, otherwise all review of systems negative.  Past Medical History:  Diagnosis Date  . Anemia   . Cataract    right eye  . Depression   . Diabetes mellitus 1986    poorly controlled last hemoglobin A1c  10.5 on January 01, 2010, increase since June 2011 when hemoglobin A1c was 9.3 , determined to be most likely secondary to non-compliance.  . Diabetic nephropathy ([place])   . Diabetic retinopathy   . End stage renal disease on dialysis [facility])    T/Th/Sat [address]  . Glaucoma   . Hyperlipidemia   . Hypertension   . Nausea and vomiting   . Retinopathy     Past Surgical History:  Procedure Laterality Date  . ABDOMINAL HYSTERECTOMY   2004  . IR FLUORO GUIDE CV LINE RIGHT  11/15/2018  . IR US GUIDE VASC ACCESS RIGHT  11/15/2018     reports that she has never smoked. She has never used smokeless tobacco. She reports that she does not drink alcohol or use drugs.  Allergies  Allergen Reactions  . Doxycycline Nausea And Vomiting  . Victoza [Liraglutide] Nausea And Vomiting    Family History  Problem Relation Age of Onset  . Diabetes Mother   . Stroke Mother     . Hypertension Mother   . Alcohol abuse Mother   . Depression Father        Committed suicide  . Alcohol abuse Maternal Uncle      Prior to Admission medications   Medication Sig Start Date End Date Taking? Authorizing Provider  amLODipine (NORVASC) 10 MG tablet TAKE 1 TABLET (10 MG TOTAL) BY MOUTH DAILY. Patient taking differently: Take 10 mg by mouth every evening.  11/18/18   Ladell Pier, MD  atorvastatin (LIPITOR) 20 MG tablet Take 1 tablet (20 mg total) by mouth daily. Patient taking differently: Take 20 mg by mouth every evening.  01/04/18   Ladell Pier, MD  Blood Glucose Monitoring Suppl (TRUE METRIX METER) w/Device KIT 1 each by Does not apply route as needed. 11/18/18   Ladell Pier, MD  carvedilol (COREG) 25 MG tablet Take 1.5 tablets (37.5 mg total) by mouth 2 (two) times daily with a meal. 04/06/18   Ladell Pier, MD  cyclobenzaprine (FLEXERIL) 10 MG tablet TAKE 1 TABLET BY MOUTH DAILY AS NEEDED FOR MUSCLE SPASMS. Patient taking differently: Take 10 mg by mouth 3 (three) times daily as needed for muscle spasms.  08/19/18   Ladell Pier, MD  glucose blood (TRUE METRIX BLOOD GLUCOSE TEST) test strip 1 each by Other route 3 (three)  times daily. 10/20/14   Funches, Adriana Mccallum, MD  glucose blood test strip Use as instructed 01/25/18   Ladell Pier, MD  insulin aspart (NOVOLOG FLEXPEN) 100 UNIT/ML FlexPen Inject 10-14 Units into the skin 3 (three) times daily with meals. INJECT 10-14 UNITS UNDER THE SKIN 3 TIMES DAILY WITH MEALS. Patient taking differently: Inject 10-14 Units into the skin 3 (three) times daily with meals. Sliding scale 02/01/18   Elayne Snare, MD  Insulin Glargine (BASAGLAR KWIKPEN) 100 UNIT/ML SOPN INJECT 50 UNITS INTO THE SKIN DAILY Patient taking differently: Inject 30 Units into the skin at bedtime.  06/21/18   Elayne Snare, MD  Insulin Syringe-Needle U-100 (INSULIN SYRINGE 1CC/31GX5/16") 31G X 5/16" 1 ML MISC Use as directed.  BD 78m  x 31G needles 01/22/17   JLadell Pier MD  Insulin Syringes, Disposable, U-100 0.5 ML MISC 1 each by Does not apply route 2 (two) times daily. 01/30/14   Funches, JAdriana Mccallum MD  multivitamin (RENA-VIT) TABS tablet Take 1 tablet by mouth daily. 11/24/18   [provider]  TRUEPLUS LANCETS 28G MISC 1 each by Does not apply route 3 (three) times daily. 01/30/14   Funches, JAdriana Mccallum MD  TRUEPLUS PEN NEEDLES 31G X 5 MM MISC USE AS DIRECTED 03/09/18   JLadell Pier MD  zolpidem (AMBIEN) 5 MG tablet Take 1 tablet (5 mg total) by mouth at bedtime as needed. Each prescription to last a month. Patient taking differently: Take 5 mg by mouth at bedtime as needed for sleep. Each prescription to last a month. 06/04/17   JLadell Pier MD    Physical Exam: Vitals:   01/11/19 1915 01/11/19 1930 01/11/19 2000 01/11/19 2030  BP: (!) 179/85 (!) 185/83 (!) 170/78 (!) 189/81  Pulse: 67 72 70 65  Resp: (!) 24 18 (!) 27 (!) 26  Temp:      TempSrc:      SpO2: 95% 94% 93% 93%    Constitutional: NAD, calm, comfortable Eyes: PERRL, lids and conjunctivae normal ENMT: Mucous membranes are moist. Posterior pharynx clear of any exudate or lesions.Normal dentition.  Neck: normal, supple, no masses, no thyromegaly Respiratory: clear to auscultation bilaterally, no wheezing, no crackles. Normal respiratory effort. No accessory muscle use.  Cardiovascular: Regular rate and rhythm, no murmurs / rubs / gallops. No extremity edema. 2+ pedal pulses. No carotid bruits.  Abdomen: no tenderness, no masses palpated. No hepatosplenomegaly. Bowel sounds positive.  Musculoskeletal: no clubbing / cyanosis. No joint deformity upper and lower extremities. Good ROM, no contractures. Normal muscle tone.  Skin: no rashes, lesions, ulcers. No induration Neurologic: CN 2-12 grossly intact. Sensation intact, DTR normal. Strength 5/5 in all 4.  Psychiatric: Normal judgment and insight. Alert and oriented x 3. Normal mood.     Labs on Admission: I have personally reviewed following labs and imaging studies  CBC: Recent Labs  Lab 01/11/19 1540  WBC 8.5  HGB 11.4*  HCT 37.5  MCV 91.0  PLT 3492  Basic Metabolic Panel: Recent Labs  Lab 01/11/19 1540  NA 140  K 6.8*  CL 100  CO2 18*  GLUCOSE 318*  BUN 119*  CREATININE 13.54*  CALCIUM 7.6*   GFR: CrCl cannot be calculated (Unknown ideal weight.). Liver Function Tests: Recent Labs  Lab 01/11/19 1540  AST 28  ALT 88*  ALKPHOS 218*  BILITOT 0.8  PROT 5.9*  ALBUMIN 3.6   Recent Labs  Lab 01/11/19 1540  LIPASE 34   No results for input(s):  AMMONIA in the last 168 hours. Coagulation Profile: No results for input(s): INR, PROTIME in the last 168 hours. Cardiac Enzymes: No results for input(s): CKTOTAL, CKMB, CKMBINDEX, TROPONINI in the last 168 hours. BNP (last 3 results) No results for input(s): PROBNP in the last 8760 hours. HbA1C: No results for input(s): HGBA1C in the last 72 hours. CBG: Recent Labs  Lab 01/11/19 1633  GLUCAP 315*   Lipid Profile: No results for input(s): CHOL, HDL, LDLCALC, TRIG, CHOLHDL, LDLDIRECT in the last 72 hours. Thyroid Function Tests: No results for input(s): TSH, T4TOTAL, FREET4, T3FREE, THYROIDAB in the last 72 hours. Anemia Panel: No results for input(s): VITAMINB12, FOLATE, FERRITIN, TIBC, IRON, RETICCTPCT in the last 72 hours. Urine analysis:    Component Value Date/Time   COLORURINE YELLOW 09/08/2018 2320   APPEARANCEUR CLOUDY (A) 09/08/2018 2320   LABSPEC 1.011 09/08/2018 2320   PHURINE 5.0 09/08/2018 2320   GLUCOSEU >=500 (A) 09/08/2018 2320   GLUCOSEU 500 (A) 06/10/2006 2036   HGBUR MODERATE (A) 09/08/2018 2320   HGBUR trace-intact 06/10/2006 0902   BILIRUBINUR NEGATIVE 09/08/2018 2320   BILIRUBINUR small 01/30/2014 1133   KETONESUR 5 (A) 09/08/2018 2320   PROTEINUR >=300 (A) 09/08/2018 2320   UROBILINOGEN 1.0 01/30/2014 1133   UROBILINOGEN 1.0 10/08/2010 1554   NITRITE  NEGATIVE 09/08/2018 2320   LEUKOCYTESUR NEGATIVE 09/08/2018 2320    Radiological Exams on Admission: No results found.  EKG: Independently reviewed.  Assessment/Plan Principal Problem:   Hyperkalemia, diminished renal excretion Active Problems:   DM (diabetes mellitus) type II uncontrolled with eye manifestation (HCC)   Essential hypertension   ESRD (end stage renal disease) on dialysis ([place])    1. Hyperkalemia, uremia, ESRD - 1. Nephrology planning on a couple hours of dialysis tonight then further dialysis tomorrow. 2. Will get patient admitted 3. Got amp of bicarb, novolog, lokelma 4. Will put in for repeat CMP in AM 5. Tele monitor 2. DM2 - 1. Will do 2/3rds home basal dose so 20u lantus QHS for tonight 2. 4u mealtime 3. Sensitive SSI AC 3. HTN - 1. Cont home BP meds  DVT prophylaxis: Heparin [place] Code Status: Full Family Communication: No family in room Disposition Plan: Home after admit Consults called: nephrology Admission status: Place in 68   Oumou Smead, [place] Hospitalists  How to contact the [facility] Attending or Consulting provider [place] or covering provider during after hours [place], for this patient?  1. Check the care team in [facility] and look for a) attending/consulting TRH provider listed and b) the [facility] team listed 2. Log into [email or web address]  Amion Physician Scheduling and messaging for groups and whole hospitals  On call and physician scheduling software for group practices, residents, hospitalists and other medical providers for call, clinic, rotation and shift schedules. On[employer] is a hospital-wide system for scheduling doctors and paging doctors on call. EasyPlot is for scientific plotting and data analysis.  [email or web address]  and use [place]'s universal password to access. If you do not have the password, please contact the hospital operator.  3. Locate the [facility] provider you are looking for under [facility] and page to a number  that you can be directly reached. 4. If you still have difficulty reaching the provider, please page the [facility] (Director on Call) for the Hospitalists listed on amion for assistance.  01/11/2019, 8:39 PM

## 2019-01-11 NOTE — ED Notes (Signed)
Dr. Gardner at bedside 

## 2019-01-11 NOTE — ED Triage Notes (Signed)
Pt reports flu like symptoms since Friday. Reports cold chills yesterday. Most symptoms have improved but still nauseous. Last HD Thursday.

## 2019-01-11 NOTE — Consult Note (Signed)
[facility] Reason for Consult: To manage dialysis and dialysis related needs Referring Physician: Dr Tomi Bamberger.  HPI:  Ann Bryan is an 59 y.o. female with hypertension, HLD, diabetes, ESRD on HD TTS at [facility], presented with flulike symptoms including cold/chills associated with nausea and overall not feeling well, consulted for the management of hyperkalemia and ESRD patient.  Patient had last dialysis on 12/31st and then she missed  dialysis over the weekend.  Patient reported that she was not feeling well therefore she called the center when she was advised to come to ER. In the ER, patient is apparently comfortable in room air.  The labs showed elevated potassium level of 6.8 associated with high BUN and creatinine level.  She has right IJ [facility] for the access.  Patient denied chest pain, headache, dizziness.  No SOP however reports feeling cold, chills some GI symptoms.  Dialyzes at [place] 68 kg, 4 hr 15 mins HD Bath 2K, 2.25 ca, Dialyzer 180 NRe, Heparin none. Access RIJ TDC Mircera 200 mcg last dose 12/31, iron 50 mg weekly 12/31  Past Medical History:  Diagnosis Date  . Anemia   . Cataract    right eye  . Depression   . Diabetes mellitus 1986    poorly controlled last hemoglobin A1c  10.5 on January 01, 2010, increase since June 2011 when hemoglobin A1c was 9.3 , determined to be most likely secondary to non-compliance.  . Diabetic nephropathy ([place])   . Diabetic retinopathy   . End stage renal disease on dialysis [facility])    T/Th/Sat [address]  . Glaucoma   . Hyperlipidemia   . Hypertension   . Nausea and vomiting   . Retinopathy     Past Surgical History:  Procedure Laterality Date  . ABDOMINAL HYSTERECTOMY   2004  . IR FLUORO GUIDE CV LINE RIGHT  11/15/2018  . IR US GUIDE VASC ACCESS RIGHT  11/15/2018    Family History  Problem Relation Age of Onset  . Diabetes Mother   . Stroke Mother   . Hypertension Mother   . Alcohol abuse Mother    . Depression Father        Committed suicide  . Alcohol abuse Maternal Uncle     Social History:  reports that she has never smoked. She has never used smokeless tobacco. She reports that she does not drink alcohol or use drugs.  Allergies:  Allergies  Allergen Reactions  . Doxycycline Nausea And Vomiting  . Victoza [Liraglutide] Nausea And Vomiting    Medications: I have reviewed the patient's current   Results for orders placed or performed during the hospital encounter of 01/11/19 (from the past 48 hour(s))  Lipase, blood     Status: None   Collection Time: 01/11/19  3:40 PM  Result Value Ref Range   Lipase 34 11 - 51 U/L    Comment: Performed at [facility], [place] [address] [place], [place] [address]  Comprehensive metabolic panel     Status: Abnormal   Collection Time: 01/11/19  3:40 PM  Result Value Ref Range   Sodium 140 135 - 145 mmol/L   Potassium 6.8 (HH) 3.5 - 5.1 mmol/L    Comment: SLIGHT HEMOLYSIS CRITICAL RESULT CALLED TO, READ BACK BY AND VERIFIED WITH: M.BARBER RN 1649 01/11/19 MCCORMICK K    Chloride 100 98 - 111 mmol/L   CO2 18 (L) 22 - 32 mmol/L   Glucose, Bld 318 (H) 70 -  99 mg/dL   BUN 119 (H) 6 - 20 mg/dL   Creatinine, Ser 13.54 (H) 0.44 - 1.00 mg/dL   Calcium 7.6 (L) 8.9 - 10.3 mg/dL   Total Protein 5.9 (L) 6.5 - 8.1 g/dL   Albumin 3.6 3.5 - 5.0 g/dL   AST 28 15 - 41 U/L   ALT 88 (H) 0 - 44 U/L   Alkaline Phosphatase 218 (H) 38 - 126 U/L   Total Bilirubin 0.8 0.3 - 1.2 mg/dL   GFR calc non Af Amer 3 (L) >60 mL/min   GFR calc Af Amer 3 (L) >60 mL/min   Anion gap 22 (H) 5 - 15    Comment: Performed at [facility], [place] [address] [place], [place] [address]  CBC     Status: Abnormal   Collection Time: 01/11/19  3:40 PM  Result Value Ref Range   WBC 8.5 4.0 - 10.5 K/uL   RBC 4.12 3.87 - 5.11 MIL/uL   Hemoglobin 11.4 (L) 12.0 - 15.0 g/dL   HCT 37.5 36.0 - 46.0 %   MCV 91.0 80.0 - 100.0 fL   MCH 27.7 26.0 - 34.0 pg   MCHC 30.4  30.0 - 36.0 g/dL   RDW 15.8 (H) 11.5 - 15.5 %   Platelets 311 150 - 400 K/uL   nRBC 0.0 0.0 - 0.2 %    Comment: Performed at [facility], [place] [address], [place], [place] [address]  I-Stat beta hCG blood, ED     Status: None   Collection Time: 01/11/19  3:52 PM  Result Value Ref Range   I-stat hCG, quantitative <5.0 <5 mIU/mL   Comment 3            Comment:   GEST. AGE      CONC.  (mIU/mL)   <=1 WEEK        5 - 50     2 WEEKS       50 - 500     3 WEEKS       100 - 10,000     4 WEEKS     1,000 - 30,000        FEMALE AND NON-PREGNANT FEMALE:     LESS THAN 5 mIU/mL   CBG monitoring, ED     Status: Abnormal   Collection Time: 01/11/19  4:33 PM  Result Value Ref Range   Glucose-Capillary 315 (H) 70 - 99 mg/dL   Comment 1 Notify RN    Comment 2 Document in Chart     No results found.  ROS: As in the H&P.  Other system reviewed and are negative. Blood pressure (!) 171/76, pulse 69, temperature 98.4 F (36.9 C), temperature source Oral, resp. rate 16, last menstrual period 03/13/2002, SpO2 95 %. Patient was not directly examined because of high risk of COVID-19 given her symptoms.  The Covid test is not back yet.  I reviewed the record including the lab report and discussed with ER physician and talked with the patient.  Assessment/Plan: 1 Hyperkalemia: Due to missed dialysis.  Plan for short dialysis tonight if schedule allows.  Recommend medical treatment with Lokelma, bicarb and insulin while waiting for dialysis.  The Covid test is is still pending.  2 ESRD: TTS at [facility].  Plan for HD as above.  She has right IJ for the access.  3 Hypertension: BP elevated.  UF during dialysis.  Continue home medication.  4. Anemia of ESRD: Hemoglobin at  goal.  Received ESA and iron at OP HD unit.  5. Metabolic Bone Disease: Check phosphorus level.  Hilberto Burzynski Tanna Furry 01/11/2019, 7:25 PM

## 2019-01-11 NOTE — ED Provider Notes (Signed)
[facility] [place] EMERGENCY DEPARTMENT Provider Note   CSN: [id number] Arrival date & time: 01/11/19  1350     History Chief Complaint  Patient presents with  . Nausea  . missed HD    Ann Bryan is a 59 y.o. female.  HPI   Pt started to feel bad on Friday.  She was having uri sx as well as diarrhea.  That continued on Sunday.  She was supposed to go to dialysis but she was having diarrhea.  She called and they told her to stay home.  Pt called the dialysis center yesterday and they recommended she get checked at dialysis.  Pt then called her PCP today as instructed and she has a phone appointment tomorrow.  Pt is feeling better than the other day.  She is not having any vomiting or diarrhea today.  Occsnl sneeze but no cough.  No shortness of breath.  Pt's last dialysis was on Thursday.  Past Medical History:  Diagnosis Date  . Anemia   . Cataract    right eye  . Depression   . Diabetes mellitus 1986    poorly controlled last hemoglobin A1c  10.5 on January 01, 2010, increase since June 2011 when hemoglobin A1c was 9.3 , determined to be most likely secondary to non-compliance.  . Diabetic nephropathy ([place])   . Diabetic retinopathy   . End stage renal disease on dialysis [facility])    T/Th/Sat [address]  . Glaucoma   . Hyperlipidemia   . Hypertension   . Nausea and vomiting   . Retinopathy     Patient Active Problem List   Diagnosis Date Noted  . ESRD (end stage renal disease) on dialysis ([place]) 11/18/2018  . Normocytic anemia 11/18/2018  . Diabetes mellitus without complication ([place]) 22/29/7989  . Nausea and vomiting 09/28/2017  . Insomnia 06/05/2017  . Chronic seasonal allergic rhinitis 12/10/2015  . Arthralgia of both hands 12/10/2015  . Severe episode of recurrent major depressive disorder, without psychotic features ([place]) 12/10/2015  . NEPHROPATHY, DIABETIC 09/23/2006  . Essential hypertension 12/23/2005  . DM (diabetes mellitus) type II  uncontrolled with eye manifestation ([place]) 01/07/1984    Past Surgical History:  Procedure Laterality Date  . ABDOMINAL HYSTERECTOMY   2004  . IR FLUORO GUIDE CV LINE RIGHT  11/15/2018  . IR US GUIDE VASC ACCESS RIGHT  11/15/2018     OB History   No obstetric history on file.     Family History  Problem Relation Age of Onset  . Diabetes Mother   . Stroke Mother   . Hypertension Mother   . Alcohol abuse Mother   . Depression Father        Committed suicide  . Alcohol abuse Maternal Uncle     Social History   Tobacco Use  . Smoking status: Never Smoker  . Smokeless tobacco: Never Used  Substance Use Topics  . Alcohol use: No  . Drug use: No    Home Medications Prior to Admission medications   Medication Sig Start Date End Date Taking? Authorizing Provider  amLODipine (NORVASC) 10 MG tablet TAKE 1 TABLET (10 MG TOTAL) BY MOUTH DAILY. Patient taking differently: Take 10 mg by mouth every evening.  11/18/18   Ladell Pier, MD  atorvastatin (LIPITOR) 20 MG tablet Take 1 tablet (20 mg total) by mouth daily. Patient taking differently: Take 20 mg by mouth every evening.  01/04/18   Ladell Pier, MD  Blood Glucose Monitoring Suppl (  TRUE METRIX METER) w/Device KIT 1 each by Does not apply route as needed. 11/18/18   Ladell Pier, MD  carvedilol (COREG) 25 MG tablet Take 1.5 tablets (37.5 mg total) by mouth 2 (two) times daily with a meal. 04/06/18   Ladell Pier, MD  cyclobenzaprine (FLEXERIL) 10 MG tablet TAKE 1 TABLET BY MOUTH DAILY AS NEEDED FOR MUSCLE SPASMS. Patient taking differently: Take 10 mg by mouth 3 (three) times daily as needed for muscle spasms.  08/19/18   Ladell Pier, MD  glucose blood (TRUE METRIX BLOOD GLUCOSE TEST) test strip 1 each by Other route 3 (three) times daily. 10/20/14   Boykin Nearing, MD  glucose blood test strip Use as instructed 01/25/18   Ladell Pier, MD  hydrALAZINE (APRESOLINE) 50 MG tablet Take 1 tablet (50  mg total) by mouth 3 (three) times daily. Patient taking differently: Take 50 mg by mouth 2 (two) times daily.  11/18/18   Ladell Pier, MD  insulin aspart (NOVOLOG FLEXPEN) 100 UNIT/ML FlexPen Inject 10-14 Units into the skin 3 (three) times daily with meals. INJECT 10-14 UNITS UNDER THE SKIN 3 TIMES DAILY WITH MEALS. Patient taking differently: Inject 10-14 Units into the skin 3 (three) times daily with meals. Sliding scale 02/01/18   Elayne Snare, MD  Insulin Glargine (BASAGLAR KWIKPEN) 100 UNIT/ML SOPN INJECT 50 UNITS INTO THE SKIN DAILY Patient taking differently: Inject 30 Units into the skin at bedtime.  06/21/18   Elayne Snare, MD  Insulin Syringe-Needle U-100 (INSULIN SYRINGE 1CC/31GX5/16") 31G X 5/16" 1 ML MISC Use as directed.  BD 64m x 31G needles 01/22/17   JLadell Pier MD  Insulin Syringes, Disposable, U-100 0.5 ML MISC 1 each by Does not apply route 2 (two) times daily. 01/30/14   Funches, JAdriana Mccallum MD  metoCLOPramide (REGLAN) 5 MG tablet Take 1 tablet (5 mg total) by mouth 3 (three) times daily before meals. Patient not taking: Reported on 12/15/2018 01/04/18   JLadell Pier MD  multivitamin (RENA-VIT) TABS tablet Take 1 tablet by mouth daily. 11/24/18   [provider]  ondansetron (ZOFRAN ODT) 4 MG disintegrating tablet Take 1 tablet (4 mg total) by mouth every 8 (eight) hours as needed for nausea or vomiting. Patient not taking: Reported on 12/15/2018 09/09/18   HAntonietta Breach PA-C  TRUEPLUS LANCETS 28G MISC 1 each by Does not apply route 3 (three) times daily. 01/30/14   Funches, JAdriana Mccallum MD  TRUEPLUS PEN NEEDLES 31G X 5 MM MISC USE AS DIRECTED 03/09/18   JLadell Pier MD  zolpidem (AMBIEN) 5 MG tablet Take 1 tablet (5 mg total) by mouth at bedtime as needed. Each prescription to last a month. Patient taking differently: Take 5 mg by mouth at bedtime as needed for sleep. Each prescription to last a month. 06/04/17   JLadell Pier MD    Allergies      Doxycycline and Victoza [liraglutide]  Review of Systems   Review of Systems  Constitutional: Negative for fever.  Respiratory: Positive for shortness of breath (mild).   Cardiovascular: Negative for chest pain.  All other systems reviewed and are negative.   Physical Exam Updated Vital Signs BP (!) 171/76 (BP Location: Right Arm)   Pulse 69   Temp 98.4 F (36.9 C) (Oral)   Resp 16   LMP 03/13/2002   SpO2 95%   Physical Exam Vitals and nursing note reviewed.  Constitutional:      Appearance: She is well-developed. She  is not ill-appearing or diaphoretic.  HENT:     Head: Normocephalic and atraumatic.     Right Ear: External ear normal.     Left Ear: External ear normal.  Eyes:     General: No scleral icterus.       Right eye: No discharge.        Left eye: No discharge.     Conjunctiva/sclera: Conjunctivae normal.  Neck:     Trachea: No tracheal deviation.  Cardiovascular:     Rate and Rhythm: Normal rate and regular rhythm.  Pulmonary:     Effort: Pulmonary effort is normal. No respiratory distress.     Breath sounds: Normal breath sounds. No stridor. No wheezing or rales.  Abdominal:     General: Bowel sounds are normal. There is no distension.     Palpations: Abdomen is soft.     Tenderness: There is no abdominal tenderness. There is no guarding or rebound.  Musculoskeletal:        General: No tenderness.     Cervical back: Neck supple.  Skin:    General: Skin is warm and dry.     Findings: No rash.  Neurological:     Mental Status: She is alert.     Cranial Nerves: No cranial nerve deficit (no facial droop, extraocular movements intact, no slurred speech).     Sensory: No sensory deficit.     Motor: No abnormal muscle tone or seizure activity.     Coordination: Coordination normal.     ED Results / Procedures / Treatments   Labs (all labs ordered are listed, but only abnormal results are displayed) Labs Reviewed  COMPREHENSIVE METABOLIC PANEL -  Abnormal; Notable for the following components:      Result Value   Potassium 6.8 (*)    CO2 18 (*)    Glucose, Bld 318 (*)    BUN 119 (*)    Creatinine, Ser 13.54 (*)    Calcium 7.6 (*)    Total Protein 5.9 (*)    ALT 88 (*)    Alkaline Phosphatase 218 (*)    GFR calc non Af Amer 3 (*)    GFR calc Af Amer 3 (*)    Anion gap 22 (*)    All other components within normal limits  CBC - Abnormal; Notable for the following components:   Hemoglobin 11.4 (*)    RDW 15.8 (*)    All other components within normal limits  CBG MONITORING, ED - Abnormal; Notable for the following components:   Glucose-Capillary 315 (*)    All other components within normal limits  RESPIRATORY PANEL BY RT PCR (FLU A&B, COVID)  LIPASE, BLOOD  I-STAT BETA HCG BLOOD, ED ([facility], [facility], [facility] ONLY)    EKG EKG Interpretation  Date/Time:  Tuesday January 11 2019 15:16:06 EST Ventricular Rate:  69 PR Interval:  166 QRS Duration: 90 QT Interval:  450 QTC Calculation: 482 R Axis:   52 Text Interpretation: Normal sinus rhythm Possible Anterior infarct , age undetermined Abnormal ECG t wave amplitude increased since last Confirmed by Dorie Rank [id number]) on 01/11/2019 6:39:24 PM   Radiology No results found.  Procedures Procedures (including critical care time)  Medications Ordered in ED Medications  sodium zirconium cyclosilicate (LOKELMA) packet 5 g (has no administration in time range)  insulin aspart (novoLOG) injection 5 Units (has no administration in time range)  sodium bicarbonate injection 25 mEq (has no administration in time range)  albuterol (VENTOLIN HFA) 108 (  90 Base) MCG/ACT inhaler 4 puff (has no administration in time range)    ED Course  I have reviewed the triage vital signs and the nursing notes.  Pertinent labs & imaging results that were available during my care of the patient were reviewed by me and considered in my medical decision making (see chart for details).  Clinical Course as of  Jan 10 1901  Tue Jan 11, 2019  2409 Discussed with Dr Carolynne Edouard.  Pt will need dialysis but will not be able to get to her until tomorrow due to other people already waiting.  I will consult medical service for admission   [JK]  Hominy tx for her hyperkalemia initiated   [JK]    Clinical Course User Index [JK] Dorie Rank, MD   MDM Rules/Calculators/A&P                      Pt presents with malaise, missed dialysis.  Recent GI illness has resolved.  Sx not typical for covid but will send off test.  Pt will need dialysis but likely not available until am.  Will start with medical management of her hyperkalemia.  Monitor closely.  Nephrology consulted.  I will consult medical service for admission. Final Clinical Impression(s) / ED Diagnoses Final diagnoses:  Hyperkalemia  Stage 5 chronic kidney disease on chronic dialysis [facility])      Dorie Rank, MD 01/11/19 1905

## 2019-01-12 ENCOUNTER — Ambulatory Visit: Payer: Self-pay | Attending: Internal Medicine

## 2019-01-12 DIAGNOSIS — I120 Hypertensive chronic kidney disease with stage 5 chronic kidney disease or end stage renal disease: Secondary | ICD-10-CM | POA: Diagnosis not present

## 2019-01-12 DIAGNOSIS — E1122 Type 2 diabetes mellitus with diabetic chronic kidney disease: Secondary | ICD-10-CM | POA: Diagnosis not present

## 2019-01-12 DIAGNOSIS — Z992 Dependence on renal dialysis: Secondary | ICD-10-CM | POA: Diagnosis not present

## 2019-01-12 DIAGNOSIS — E876 Hypokalemia: Secondary | ICD-10-CM | POA: Diagnosis not present

## 2019-01-12 LAB — MRSA PCR SCREENING: MRSA by PCR: NEGATIVE

## 2019-01-12 LAB — CBC
HCT: 34.4 % — ABNORMAL LOW (ref 36.0–46.0)
Hemoglobin: 10.9 g/dL — ABNORMAL LOW (ref 12.0–15.0)
MCH: 27.6 pg (ref 26.0–34.0)
MCHC: 31.7 g/dL (ref 30.0–36.0)
MCV: 87.1 fL (ref 80.0–100.0)
Platelets: 306 10*3/uL (ref 150–400)
RBC: 3.95 MIL/uL (ref 3.87–5.11)
RDW: 15.8 % — ABNORMAL HIGH (ref 11.5–15.5)
WBC: 7.5 10*3/uL (ref 4.0–10.5)
nRBC: 0.3 % — ABNORMAL HIGH (ref 0.0–0.2)

## 2019-01-12 LAB — COMPREHENSIVE METABOLIC PANEL
ALT: 71 U/L — ABNORMAL HIGH (ref 0–44)
AST: 17 U/L (ref 15–41)
Albumin: 3.2 g/dL — ABNORMAL LOW (ref 3.5–5.0)
Alkaline Phosphatase: 190 U/L — ABNORMAL HIGH (ref 38–126)
Anion gap: 19 — ABNORMAL HIGH (ref 5–15)
BUN: 125 mg/dL — ABNORMAL HIGH (ref 6–20)
CO2: 20 mmol/L — ABNORMAL LOW (ref 22–32)
Calcium: 7.3 mg/dL — ABNORMAL LOW (ref 8.9–10.3)
Chloride: 103 mmol/L (ref 98–111)
Creatinine, Ser: 14.26 mg/dL — ABNORMAL HIGH (ref 0.44–1.00)
GFR calc Af Amer: 3 mL/min — ABNORMAL LOW (ref >60)
GFR calc non Af Amer: 3 mL/min — ABNORMAL LOW (ref >60)
Glucose, Bld: 263 mg/dL — ABNORMAL HIGH (ref 70–99)
Potassium: 5.5 mmol/L — ABNORMAL HIGH (ref 3.5–5.1)
Sodium: 142 mmol/L (ref 135–145)
Total Bilirubin: 0.7 mg/dL (ref 0.3–1.2)
Total Protein: 5.5 g/dL — ABNORMAL LOW (ref 6.5–8.1)

## 2019-01-12 LAB — HEPATITIS B SURFACE ANTIGEN: Hepatitis B Surface Ag: NONREACTIVE

## 2019-01-12 LAB — GLUCOSE, CAPILLARY
Glucose-Capillary: 176 mg/dL — ABNORMAL HIGH (ref 70–99)
Glucose-Capillary: 181 mg/dL — ABNORMAL HIGH (ref 70–99)

## 2019-01-12 MED ORDER — HEPARIN SODIUM (PORCINE) 1000 UNIT/ML IJ SOLN
INTRAMUSCULAR | Status: AC
  Administered 2019-01-12: 3800 [IU] via INTRAVENOUS_CENTRAL
  Filled 2019-01-12: qty 4

## 2019-01-12 MED ORDER — ALTEPLASE 2 MG IJ SOLR: 2.0000 mg | Freq: Once | INTRAMUSCULAR | Status: DC | PRN

## 2019-01-12 MED ORDER — SODIUM CHLORIDE 0.9 % IV SOLN: 100.0000 mL | INTRAVENOUS | Status: DC | PRN

## 2019-01-12 MED ORDER — LIDOCAINE HCL (PF) 1 % IJ SOLN: 5.0000 mL | INTRAMUSCULAR | Status: DC | PRN

## 2019-01-12 MED ORDER — PENTAFLUOROPROP-TETRAFLUOROETH EX AERO: 1.0000 "application " | INHALATION_SPRAY | CUTANEOUS | Status: DC | PRN

## 2019-01-12 MED ORDER — HEPARIN SODIUM (PORCINE) 1000 UNIT/ML DIALYSIS: 1000.0000 [IU] | INTRAMUSCULAR | Status: DC | PRN

## 2019-01-12 MED ORDER — LIDOCAINE-PRILOCAINE 2.5-2.5 % EX CREA: 1.0000 "application " | TOPICAL_CREAM | CUTANEOUS | Status: DC | PRN

## 2019-01-12 MED ORDER — BASAGLAR KWIKPEN 100 UNIT/ML ~~LOC~~ SOPN: 30.0000 [IU] | PEN_INJECTOR | Freq: Every day | SUBCUTANEOUS | Status: DC

## 2019-01-12 MED ORDER — SODIUM ZIRCONIUM CYCLOSILICATE 5 G PO PACK
5.0000 g | PACK | Freq: Once | ORAL | Status: DC
  Filled 2019-01-12: qty 1

## 2019-01-12 NOTE — Progress Notes (Signed)
[facility] NEPHROLOGY PROGRESS NOTE  Assessment/ Plan: Pt is a 59 y.o. yo female  with hypertension, HLD, diabetes, ESRD on HD TTS at [facility], presented with flulike symptoms and hyperkalemia.  Dialyzes at [facility] 68 kg, 4 hr 15 mins HD Bath 2K, 2.25 ca, Dialyzer 180 NRe, Heparin none. Access RIJ TDC Mircera 200 mcg last dose 12/31, iron 50 mg weekly 12/31  1 Hyperkalemia: Due to missed dialysis.  -Receiving dialysis this morning.  Tolerating well.  2 ESRD: TTS at [facility].    HD today as she missed few treatment before. She has right IJ for the access.  Plan for next HD tomorrow which can be done as outpatient.  3 Hypertension: BP elevated.  UF during dialysis.  Continue home medication.  4. Anemia of ESRD: Hemoglobin at goal.  Received ESA and iron at OP HD unit.  5. Metabolic Bone Disease: Check phosphorus level.  Okay to discharge from renal perspective.  She can resume her dialysis tomorrow at OP HD unit.  Subjective: Seen and examined at dialysis unit.  Tolerating dialysis well.  Denied nausea vomiting chest pain shortness of breath.  No diarrhea. Objective Vital signs in last 24 hours: Vitals:   01/12/19 0730 01/12/19 0800 01/12/19 0830 01/12/19 0858  BP: (!) 193/92 (!) 180/96 (!) 192/55 (!) 183/98  Pulse: 78 75 75 76  Resp:      Temp:      TempSrc:      SpO2:      Weight:      Height:       Weight change:   Intake/Output Summary (Last 24 hours) at 01/12/2019 0940 Last data filed at 01/12/2019 0600 Gross per 24 hour  Intake 240 ml  Output 0 ml  Net 240 ml       Labs: Basic Metabolic Panel: Recent Labs  Lab 01/11/19 1540 01/12/19 0245  NA 140 142  K 6.8* 5.5*  CL 100 103  CO2 18* 20*  GLUCOSE 318* 263*  BUN 119* 125*  CREATININE 13.54* 14.26*  CALCIUM 7.6* 7.3*   Liver Function Tests: Recent Labs  Lab 01/11/19 1540 01/12/19 0245  AST 28 17  ALT 88* 71*  ALKPHOS 218* 190*  BILITOT 0.8 0.7  PROT 5.9* 5.5*  ALBUMIN 3.6  3.2*   Recent Labs  Lab 01/11/19 1540  LIPASE 34   No results for input(s): AMMONIA in the last 168 hours. CBC: Recent Labs  Lab 01/11/19 1540 01/12/19 0727  WBC 8.5 7.5  HGB 11.4* 10.9*  HCT 37.5 34.4*  MCV 91.0 87.1  PLT 311 306   Cardiac Enzymes: No results for input(s): CKTOTAL, CKMB, CKMBINDEX, TROPONINI in the last 168 hours. CBG: Recent Labs  Lab 01/11/19 1633 01/11/19 2039 01/11/19 2100 01/12/19 0628  GLUCAP 315* 253* 226* 176*    Iron Studies: No results for input(s): IRON, TIBC, TRANSFERRIN, FERRITIN in the last 72 hours. Studies/Results: No results found.  Medications: Infusions: . sodium chloride    . sodium chloride      Scheduled Medications: . amLODipine  10 mg Oral QPM  . atorvastatin  20 mg Oral QPM  . carvedilol  37.5 mg Oral BID WC  . Chlorhexidine Gluconate Cloth  6 each Topical Q0600  . heparin      . heparin  5,000 Units Subcutaneous Q8H  . insulin aspart  0-9 Units Subcutaneous TID WC  . insulin aspart  4 Units Subcutaneous TID WC  . insulin glargine  20  Units Subcutaneous QHS  . multivitamin  1 tablet Oral Daily  . sodium zirconium cyclosilicate  5 g Oral Once    have reviewed scheduled and prn medications.  Physical Exam: General:NAD, comfortable Heart:RRR, s1s2 nl Lungs:clear b/l, no crackle Abdomen:soft, Non-tender, non-distended Extremities:No edema Dialysis Access: Right IJ TDC.  Ann Bryan Ann Bryan 01/12/2019,9:40 AM  LOS: 1 day  Pager: ID:[phone]

## 2019-01-12 NOTE — Discharge Summary (Signed)
Ann Bryan:[id number] DOB: 1960/08/23 DOA: 01/11/2019  PCP: Ladell Pier, MD  Admit date: 01/11/2019 Discharge date: 01/23/2019  Admitted From: Home Disposition: Home Recommendations for Outpatient Follow-up:  1. Follow up with PCP in 1-2 weeks 2. Please obtain BMP in one week during HD   Home Health:NO Equipment/Devices:none Discharge Condition:Stable CODE STATUS:FULL Diet recommendation: Renal Diet   Brief/Interim Summary: History of present illness:  Ann Bryan is a 59 y.o. year old female with medical history significant for DM2, HTN, ESRD on TTS dialysis who presented on 01/11/2019 with several days of URI symptoms and diarrhea and was found to have a potassium of 6.8 with peaked T waves on EKG and BUN of 119 after missing dialysis.  Remaining hospital course addressed in problem based format below:   Hospital Course:   ESRD on HD, complicated by hyperkalemia and uremic symptoms.  In the setting of missed outpatient dialysis related to URI symptoms.  Potassium improved to normal with resumption of HD during observation day.  She also received an amp of bicarb, no follow-up and Lokelma while awaiting HD.  Can resume outpatient HD on discharge  URI symptoms.  Covid test negative.  Symptoms improved with supportive care.  No diarrhea during observation stay.  Close follow-up with PCP on discharge.  Type 2 diabetes can resume home Lantus on discharge  Hypertension was maintained on her home BP meds during observation stay   Consultations:  Nephrology  Procedures/Studies: HD Subjective: Minimal cough, no diarrhea, no abdominal pain. Discharge Exam: Vitals:   01/12/19 0930 01/12/19 0945  BP: (!) 166/84 (!) 153/91  Pulse:  79  Resp:  18  Temp:  98.7 F (37.1 C)  SpO2:  94%   Vitals:   01/12/19 0858 01/12/19 0900 01/12/19 0930 01/12/19 0945  BP: (!) 183/98 (!) 172/88 (!) 166/84 (!) 153/91  Pulse: 76   79  Resp:    18  Temp:    98.7 F (37.1 C)  TempSrc:     Oral  SpO2:    94%  Weight:    70.7 kg  Height:        General: Lying in bed, no apparent distress Eyes: EOMI, anicteric ENT: Oral Mucosa clear and moist Cardiovascular: regular rate and rhythm, no murmurs, rubs or gallops, no edema, Respiratory: Normal respiratory effort, lungs clear to auscultation bilaterally Abdomen: soft, non-distended, non-tender, normal bowel sounds Skin: No Rash Neurologic: Grossly no focal neuro deficit.Mental status AAOx3, speech normal, Psychiatric:Appropriate affect, and mood  Discharge Diagnoses:  Principal Problem:   Hyperkalemia, diminished renal excretion Active Problems:   DM (diabetes mellitus) type II uncontrolled with eye manifestation (HCC)   Essential hypertension   ESRD (end stage renal disease) on dialysis [facility])    Discharge Instructions  Discharge Instructions    Diet - low sodium heart healthy   Complete by: As directed    Increase activity slowly   Complete by: As directed      Allergies as of 01/12/2019      Reactions   Biopatch Protective Disk-chg [chlorhexidine]    States rash with use of CHG bath   Doxycycline Nausea And Vomiting   Victoza [liraglutide] Nausea And Vomiting      Medication List    STOP taking these medications   Basaglar KwikPen 100 UNIT/ML Sopn     TAKE these medications   amLODipine 10 MG tablet Commonly known as: NORVASC TAKE 1 TABLET (10 MG TOTAL) BY MOUTH DAILY. What changed: additional instructions  atorvastatin 20 MG tablet Commonly known as: LIPITOR Take 1 tablet (20 mg total) by mouth daily. What changed: when to take this   carvedilol 25 MG tablet Commonly known as: COREG Take 1.5 tablets (37.5 mg total) by mouth 2 (two) times daily with a meal.   cyclobenzaprine 10 MG tablet Commonly known as: FLEXERIL TAKE 1 TABLET BY MOUTH DAILY AS NEEDED FOR MUSCLE SPASMS. What changed: See the new instructions.   glucose blood test strip Commonly known as: True Metrix Blood Glucose  Test 1 each by Other route 3 (three) times daily.   glucose blood test strip Use as instructed   INSULIN SYRINGE 1CC/31GX5/16" 31G X 5/16" 1 ML Misc Use as directed.  BD 66m x 31G needles   Insulin Syringes (Disposable) U-100 0.5 ML Misc 1 each by Does not apply route 2 (two) times daily.   multivitamin Tabs tablet Take 1 tablet by mouth daily.   True Metrix Meter w/Device Kit 1 each by Does not apply route as needed.   TRUEplus Lancets 28G Misc 1 each by Does not apply route 3 (three) times daily.   TRUEplus Pen Needles 31G X 5 MM Misc Generic drug: Insulin Pen Needle USE AS DIRECTED   zolpidem 5 MG tablet Commonly known as: AMBIEN Take 1 tablet (5 mg total) by mouth at bedtime as needed. Each prescription to last a month. What changed: reasons to take this       Allergies  Allergen Reactions  . Biopatch Protective Disk-Chg [Chlorhexidine]     States rash with use of CHG bath  . Doxycycline Nausea And Vomiting  . Victoza [Liraglutide] Nausea And Vomiting        The results of significant diagnostics from this hospitalization (including imaging, microbiology, ancillary and laboratory) are listed below for reference.     Microbiology: Recent Results (from the past 240 hour(s))  SARS CORONAVIRUS 2 (TAT 6-24 HRS) Nasopharyngeal Nasopharyngeal Swab     Status: None   Collection Time: 01/20/19 12:26 PM   Specimen: Nasopharyngeal Swab  Result Value Ref Range Status   SARS Coronavirus 2 NEGATIVE NEGATIVE Final    Comment: (NOTE) SARS-CoV-2 target nucleic acids are NOT DETECTED. The SARS-CoV-2 RNA is generally detectable in upper and lower respiratory specimens during the acute phase of infection. Negative results do not preclude SARS-CoV-2 infection, do not rule out co-infections with other pathogens, and should not be used as the sole basis for treatment or other patient management decisions. Negative results must be combined with clinical  observations, patient history, and epidemiological information. The expected result is Negative. Fact Sheet for Patients: hSugarRoll.beFact Sheet for Healthcare Providers: hhttps://www.woods-mathews.com/This test is not yet approved or cleared by the [place] and  has been authorized for detection and/or diagnosis of SARS-CoV-2 by FDA under an Emergency Use Authorization (EUA). This EUA will remain  in effect (meaning this test can be used) for the duration of the COVID-19 declaration under Section 56 4(b)(1) of the Act, 21 U.S.C. section 360bbb-3(b)(1), unless the authorization is terminated or revoked sooner. Performed at [facility] [address] [place] [address]     Labs: BNP (last 3 results) No results for input(s): BNP in the last 8760 hours. Basic Metabolic Panel: Recent Labs  Lab 01/21/19 0745 01/22/19 0540  NA 135 138  K 5.6* 5.8*  CL 97* 96*  CO2  --  26  GLUCOSE 171* 181*  BUN 45* 47*  CREATININE 5.40* 8.00*  CALCIUM  --  8.5*   Liver Function Tests: No results for input(s): AST, ALT, ALKPHOS, BILITOT, PROT, ALBUMIN in the last 168 hours. No results for input(s): LIPASE, AMYLASE in the last 168 hours. No results for input(s): AMMONIA in the last 168 hours. CBC: Recent Labs  Lab 01/21/19 0745 01/22/19 0540  WBC  --  7.6  HGB 12.6 11.7*  HCT 37.0 37.7  MCV  --  88.1  PLT  --  260   Cardiac Enzymes: No results for input(s): CKTOTAL, CKMB, CKMBINDEX, TROPONINI in the last 168 hours. BNP: Invalid input(s): POCBNP CBG: Recent Labs  Lab 01/21/19 1229 01/21/19 1712 01/21/19 2107 01/21/19 2143 01/22/19 0645  GLUCAP 159* 267* 61* 87 190*   D-Dimer No results for input(s): DDIMER in the last 72 hours. Hgb A1c Recent Labs    01/21/19 1225  HGBA1C 7.8*   Lipid Profile No results for input(s): CHOL, HDL, LDLCALC, TRIG, CHOLHDL, LDLDIRECT in the last 72 hours. Thyroid function  studies No results for input(s): TSH, T4TOTAL, T3FREE, THYROIDAB in the last 72 hours.  Invalid input(s): FREET3 Anemia work up No results for input(s): VITAMINB12, FOLATE, FERRITIN, TIBC, IRON, RETICCTPCT in the last 72 hours. Urinalysis    Component Value Date/Time   COLORURINE YELLOW 09/08/2018 2320   APPEARANCEUR CLOUDY (A) 09/08/2018 2320   LABSPEC 1.011 09/08/2018 2320   PHURINE 5.0 09/08/2018 2320   GLUCOSEU >=500 (A) 09/08/2018 2320   GLUCOSEU 500 (A) 06/10/2006 2036   HGBUR MODERATE (A) 09/08/2018 2320   HGBUR trace-intact 06/10/2006 0902   BILIRUBINUR NEGATIVE 09/08/2018 2320   BILIRUBINUR small 01/30/2014 1133   KETONESUR 5 (A) 09/08/2018 2320   PROTEINUR >=300 (A) 09/08/2018 2320   UROBILINOGEN 1.0 01/30/2014 1133   UROBILINOGEN 1.0 10/08/2010 1554   NITRITE NEGATIVE 09/08/2018 2320   LEUKOCYTESUR NEGATIVE 09/08/2018 2320   Sepsis Labs Invalid input(s): PROCALCITONIN,  WBC,  LACTICIDVEN Microbiology Recent Results (from the past 240 hour(s))  SARS CORONAVIRUS 2 (TAT 6-24 HRS) Nasopharyngeal Nasopharyngeal Swab     Status: None   Collection Time: 01/20/19 12:26 PM   Specimen: Nasopharyngeal Swab  Result Value Ref Range Status   SARS Coronavirus 2 NEGATIVE NEGATIVE Final    Comment: (NOTE) SARS-CoV-2 target nucleic acids are NOT DETECTED. The SARS-CoV-2 RNA is generally detectable in upper and lower respiratory specimens during the acute phase of infection. Negative results do not preclude SARS-CoV-2 infection, do not rule out co-infections with other pathogens, and should not be used as the sole basis for treatment or other patient management decisions. Negative results must be combined with clinical observations, patient history, and epidemiological information. The expected result is Negative. Fact Sheet for Patients: SugarRoll.be Fact Sheet for Healthcare Providers: https://www.woods-mathews.com/ This test is not  yet approved or cleared by the [place] FDA and  has been authorized for detection and/or diagnosis of SARS-CoV-2 by FDA under an Emergency Use Authorization (EUA). This EUA will remain  in effect (meaning this test can be used) for the duration of the COVID-19 declaration under Section 56 4(b)(1) of the Act, 21 U.S.C. section 360bbb-3(b)(1), unless the authorization is terminated or revoked sooner. Performed at [facility], [place] [address] [place], [place] [address]      Time coordinating discharge: Over 30 minutes  SIGNED:   Desiree Hane, MD  [facility] 01/23/2019, 9:48 AM Pager   If 7PM-7AM, please contact night-coverage [email or web address] Password [email or web address]

## 2019-01-12 NOTE — Progress Notes (Signed)
DISCHARGE NOTE HOME Ann Bryan to be discharged Home per MD order. Discussed prescriptions and follow up appointments with the patient. Prescriptions given to patient; medication list explained in detail. Patient verbalized understanding.  Skin clean, dry and intact without evidence of skin break down, no evidence of skin tears noted. IV catheter discontinued intact. Site without signs and symptoms of complications. Dressing and pressure applied. Pt denies pain at the site currently. No complaints noted.  Patient free of lines, drains, and wounds.   An After Visit Summary (AVS) was printed and given to the patient. Patient escorted via wheelchair, and discharged home via private auto.  Aneta Mins BSN, RN3

## 2019-01-12 NOTE — Discharge Summary (Signed)
Ann Bryan PFX:[id number] DOB: [birth year] DOA: 01/11/2019  PCP: Ladell Pier, MD  Admit date: 01/11/2019 Discharge date: 01/12/2019  Admitted From: Home Disposition: Home  Recommendations for Outpatient Follow-up:  1. Follow up with PCP in 1-2 weeks 2. BMP in 1 week   Home Health: No Equipment/Devices: None Discharge Condition: Stable CODE STATUS: Full Diet recommendation: Renal  Brief/Interim Summary: History of present illness:  Ann Bryan is a 59 y.o. year old female with medical history significant for ESRD on HD TTS, type 2 diabetes, HTN who presented on 01/11/2019 with several days of URI symptoms including nasal congestion, cough, sneeze, subjective fever/chills and intermittent diarrhea causing her to miss outpatient HD sessions and was admitted under observation for hyperkalemia of 6.8 with peak T waves on EKG.  Patient received medical treatment with Lokelma, bicarb and insulin while awaiting dialysis which she received on 1/6 and tolerated well.   Remaining hospital course addressed in problem based format below:   Hospital Course:   Hypokalemia in the setting of missed HD sessions as outpatient Peak potassium 6.8 down trended to 5.5 with Lokelma/bicarb insulin Patient received HD session following that -Advised outpatient BMP in 1 week on discharge  ESRD on HD, TTS -We will resume outpatient schedule on discharge  URI symptoms, resolved Covid negative -Continue supportive care as outpatient  HTN Elevated on admission with SBP 170s to 80s, in setting of missed HD sessions as outpatient Managed with HD during hospitalization -Resume home BP meds on discharge.   Consultations:  Nephrology  Procedures/Studies: HD on 1/6 Subjective: Feels well No diarrhea during hospital stay No recurrent cough, sneeze, fevers or chills. Discharge Exam: Vitals:   01/12/19 0930 01/12/19 0945  BP: (!) 166/84 (!) 153/91  Pulse:  79  Resp:  18  Temp:  98.7 F (37.1  C)  SpO2:  94%   Vitals:   01/12/19 0858 01/12/19 0900 01/12/19 0930 01/12/19 0945  BP: (!) 183/98 (!) 172/88 (!) 166/84 (!) 153/91  Pulse: 76   79  Resp:    18  Temp:    98.7 F (37.1 C)  TempSrc:    Oral  SpO2:    94%  Weight:    70.7 kg  Height:        General: Lying in bed, no apparent distress Eyes: EOMI, anicteric ENT: Oral Mucosa clear and moist Cardiovascular: regular rate and rhythm, no murmurs, rubs or gallops, no edema, Respiratory: Normal respiratory effort, lungs clear to auscultation bilaterally Abdomen: soft, non-distended, non-tender, normal bowel sounds Skin: No Rash Neurologic: Grossly no focal neuro deficit.Mental status AAOx3, speech normal, Psychiatric:Appropriate affect, and mood  Discharge Diagnoses:  Principal Problem:   Hyperkalemia, diminished renal excretion Active Problems:   DM (diabetes mellitus) type II uncontrolled with eye manifestation (HCC)   Essential hypertension   ESRD (end stage renal disease) on dialysis [facility])    Discharge Instructions  Discharge Instructions    Diet - low sodium heart healthy   Complete by: As directed    Increase activity slowly   Complete by: As directed      Allergies as of 01/12/2019      Reactions   Doxycycline Nausea And Vomiting   Victoza [liraglutide] Nausea And Vomiting      Medication List    TAKE these medications   amLODipine 10 MG tablet Commonly known as: NORVASC TAKE 1 TABLET (10 MG TOTAL) BY MOUTH DAILY. What changed: when to take this   atorvastatin 20 MG tablet Commonly  known as: LIPITOR Take 1 tablet (20 mg total) by mouth daily. What changed: when to take this   Basaglar KwikPen 100 UNIT/ML Sopn Inject 0.3 mLs (30 Units total) into the skin at bedtime. What changed: See the new instructions.   carvedilol 25 MG tablet Commonly known as: COREG Take 1.5 tablets (37.5 mg total) by mouth 2 (two) times daily with a meal.   cyclobenzaprine 10 MG tablet Commonly known as:  FLEXERIL TAKE 1 TABLET BY MOUTH DAILY AS NEEDED FOR MUSCLE SPASMS. What changed: See the new instructions.   glucose blood test strip Commonly known as: True Metrix Blood Glucose Test 1 each by Other route 3 (three) times daily.   glucose blood test strip Use as instructed   insulin aspart 100 UNIT/ML FlexPen Commonly known as: NovoLOG FlexPen Inject 10-14 Units into the skin 3 (three) times daily with meals. INJECT 10-14 UNITS UNDER THE SKIN 3 TIMES DAILY WITH MEALS. What changed: additional instructions   INSULIN SYRINGE 1CC/31GX5/16" 31G X 5/16" 1 ML Misc Use as directed.  BD 26m x 31G needles   Insulin Syringes (Disposable) U-100 0.5 ML Misc 1 each by Does not apply route 2 (two) times daily.   multivitamin Tabs tablet Take 1 tablet by mouth daily.   True Metrix Meter w/Device Kit 1 each by Does not apply route as needed.   TRUEplus Lancets 28G Misc 1 each by Does not apply route 3 (three) times daily.   TRUEplus Pen Needles 31G X 5 MM Misc Generic drug: Insulin Pen Needle USE AS DIRECTED   zolpidem 5 MG tablet Commonly known as: AMBIEN Take 1 tablet (5 mg total) by mouth at bedtime as needed. Each prescription to last a month. What changed: reasons to take this       Allergies  Allergen Reactions  . Doxycycline Nausea And Vomiting  . Victoza [Liraglutide] Nausea And Vomiting        The results of significant diagnostics from this hospitalization (including imaging, microbiology, ancillary and laboratory) are listed below for reference.     Microbiology: Recent Results (from the past 240 hour(s))  Respiratory Panel by RT PCR (Flu A&B, Covid) - Nasopharyngeal Swab     Status: None   Collection Time: 01/11/19  6:57 PM   Specimen: Nasopharyngeal Swab  Result Value Ref Range Status   SARS Coronavirus 2 by RT PCR NEGATIVE NEGATIVE Final    Comment: (NOTE) SARS-CoV-2 target nucleic acids are NOT DETECTED. The SARS-CoV-2 RNA is generally detectable in  upper respiratoy specimens during the acute phase of infection. The lowest concentration of SARS-CoV-2 viral copies this assay can detect is 131 copies/mL. A negative result does not preclude SARS-Cov-2 infection and should not be used as the sole basis for treatment or other patient management decisions. A negative result may occur with  improper specimen collection/handling, submission of specimen other than nasopharyngeal swab, presence of viral mutation(s) within the areas targeted by this assay, and inadequate number of viral copies (<131 copies/mL). A negative result must be combined with clinical observations, patient history, and epidemiological information. The expected result is Negative. Fact Sheet for Patients:  hPinkCheek.beFact Sheet for Healthcare Providers:  hGravelBags.itThis test is not yet ap proved or cleared by the [place] and  has been authorized for detection and/or diagnosis of SARS-CoV-2 by FDA under an Emergency Use Authorization (EUA). This EUA will remain  in effect (meaning this test can be used) for the duration of the COVID-19 declaration under  Section 564(b)(1) of the Act, 21 U.S.C. section 360bbb-3(b)(1), unless the authorization is terminated or revoked sooner.    Influenza A by PCR NEGATIVE NEGATIVE Final   Influenza B by PCR NEGATIVE NEGATIVE Final    Comment: (NOTE) The Xpert Xpress SARS-CoV-2/FLU/RSV assay is intended as an aid in  the diagnosis of influenza from Nasopharyngeal swab specimens and  should not be used as a sole basis for treatment. Nasal washings and  aspirates are unacceptable for Xpert Xpress SARS-CoV-2/FLU/RSV  testing. Fact Sheet for Patients: PinkCheek.be Fact Sheet for Healthcare Providers: GravelBags.it This test is not yet approved or cleared by the [place] FDA and  has been authorized for  detection and/or diagnosis of SARS-CoV-2 by  FDA under an Emergency Use Authorization (EUA). This EUA will remain  in effect (meaning this test can be used) for the duration of the  Covid-19 declaration under Section 564(b)(1) of the Act, 21  U.S.C. section 360bbb-3(b)(1), unless the authorization is  terminated or revoked. Performed at [facility], [place] [address], [place], [place] [address]   MRSA PCR Screening     Status: None   Collection Time: 01/11/19  9:25 PM   Specimen: Nasal Mucosa; Nasopharyngeal  Result Value Ref Range Status   MRSA by PCR NEGATIVE NEGATIVE Final    Comment:        The GeneXpert MRSA Assay (FDA approved for NASAL specimens only), is one component of a comprehensive MRSA colonization surveillance program. It is not intended to diagnose MRSA infection nor to guide or monitor treatment for MRSA infections. Performed at [facility], [place] [address] [place], [place] [address]      Labs: BNP (last 3 results) No results for input(s): BNP in the last 8760 hours. Basic Metabolic Panel: Recent Labs  Lab 01/11/19 1540 01/12/19 0245  NA 140 142  K 6.8* 5.5*  CL 100 103  CO2 18* 20*  GLUCOSE 318* 263*  BUN 119* 125*  CREATININE 13.54* 14.26*  CALCIUM 7.6* 7.3*   Liver Function Tests: Recent Labs  Lab 01/11/19 1540 01/12/19 0245  AST 28 17  ALT 88* 71*  ALKPHOS 218* 190*  BILITOT 0.8 0.7  PROT 5.9* 5.5*  ALBUMIN 3.6 3.2*   Recent Labs  Lab 01/11/19 1540  LIPASE 34   No results for input(s): AMMONIA in the last 168 hours. CBC: Recent Labs  Lab 01/11/19 1540 01/12/19 0727  WBC 8.5 7.5  HGB 11.4* 10.9*  HCT 37.5 34.4*  MCV 91.0 87.1  PLT 311 306   Cardiac Enzymes: No results for input(s): CKTOTAL, CKMB, CKMBINDEX, TROPONINI in the last 168 hours. BNP: Invalid input(s): POCBNP CBG: Recent Labs  Lab 01/11/19 1633 01/11/19 2039 01/11/19 2100 01/12/19 0628 01/12/19 1103  GLUCAP 315* 253* 226* 176* 181*    D-Dimer No results for input(s): DDIMER in the last 72 hours. Hgb A1c No results for input(s): HGBA1C in the last 72 hours. Lipid Profile No results for input(s): CHOL, HDL, LDLCALC, TRIG, CHOLHDL, LDLDIRECT in the last 72 hours. Thyroid function studies No results for input(s): TSH, T4TOTAL, T3FREE, THYROIDAB in the last 72 hours.  Invalid input(s): FREET3 Anemia work up No results for input(s): VITAMINB12, FOLATE, FERRITIN, TIBC, IRON, RETICCTPCT in the last 72 hours. Urinalysis    Component Value Date/Time   COLORURINE YELLOW 09/08/2018 2320   APPEARANCEUR CLOUDY (A) 09/08/2018 2320   LABSPEC 1.011 09/08/2018 2320   PHURINE 5.0 09/08/2018 2320   GLUCOSEU >=500 (A) 09/08/2018 2320  GLUCOSEU 500 (A) 06/10/2006 2036   HGBUR MODERATE (A) 09/08/2018 2320   HGBUR trace-intact 06/10/2006 0902   BILIRUBINUR NEGATIVE 09/08/2018 2320   BILIRUBINUR small 01/30/2014 1133   KETONESUR 5 (A) 09/08/2018 2320   PROTEINUR >=300 (A) 09/08/2018 2320   UROBILINOGEN 1.0 01/30/2014 1133   UROBILINOGEN 1.0 10/08/2010 1554   NITRITE NEGATIVE 09/08/2018 2320   LEUKOCYTESUR NEGATIVE 09/08/2018 2320   Sepsis Labs Invalid input(s): PROCALCITONIN,  WBC,  LACTICIDVEN Microbiology Recent Results (from the past 240 hour(s))  Respiratory Panel by RT PCR (Flu A&B, Covid) - Nasopharyngeal Swab     Status: None   Collection Time: 01/11/19  6:57 PM   Specimen: Nasopharyngeal Swab  Result Value Ref Range Status   SARS Coronavirus 2 by RT PCR NEGATIVE NEGATIVE Final    Comment: (NOTE) SARS-CoV-2 target nucleic acids are NOT DETECTED. The SARS-CoV-2 RNA is generally detectable in upper respiratoy specimens during the acute phase of infection. The lowest concentration of SARS-CoV-2 viral copies this assay can detect is 131 copies/mL. A negative result does not preclude SARS-Cov-2 infection and should not be used as the sole basis for treatment or other patient management decisions. A negative result  may occur with  improper specimen collection/handling, submission of specimen other than nasopharyngeal swab, presence of viral mutation(s) within the areas targeted by this assay, and inadequate number of viral copies (<131 copies/mL). A negative result must be combined with clinical observations, patient history, and epidemiological information. The expected result is Negative. Fact Sheet for Patients:  PinkCheek.be Fact Sheet for Healthcare Providers:  GravelBags.it This test is not yet ap proved or cleared by the [place] FDA and  has been authorized for detection and/or diagnosis of SARS-CoV-2 by FDA under an Emergency Use Authorization (EUA). This EUA will remain  in effect (meaning this test can be used) for the duration of the COVID-19 declaration under Section 564(b)(1) of the Act, 21 U.S.C. section 360bbb-3(b)(1), unless the authorization is terminated or revoked sooner.    Influenza A by PCR NEGATIVE NEGATIVE Final   Influenza B by PCR NEGATIVE NEGATIVE Final    Comment: (NOTE) The Xpert Xpress SARS-CoV-2/FLU/RSV assay is intended as an aid in  the diagnosis of influenza from Nasopharyngeal swab specimens and  should not be used as a sole basis for treatment. Nasal washings and  aspirates are unacceptable for Xpert Xpress SARS-CoV-2/FLU/RSV  testing. Fact Sheet for Patients: PinkCheek.be Fact Sheet for Healthcare Providers: GravelBags.it This test is not yet approved or cleared by the [place] FDA and  has been authorized for detection and/or diagnosis of SARS-CoV-2 by  FDA under an Emergency Use Authorization (EUA). This EUA will remain  in effect (meaning this test can be used) for the duration of the  Covid-19 declaration under Section 564(b)(1) of the Act, 21  U.S.C. section 360bbb-3(b)(1), unless the authorization is  terminated or  revoked. Performed at [facility], [place] [address] [place], [place] [address]   MRSA PCR Screening     Status: None   Collection Time: 01/11/19  9:25 PM   Specimen: Nasal Mucosa; Nasopharyngeal  Result Value Ref Range Status   MRSA by PCR NEGATIVE NEGATIVE Final    Comment:        The GeneXpert MRSA Assay (FDA approved for NASAL specimens only), is one component of a comprehensive MRSA colonization surveillance program. It is not intended to diagnose MRSA infection nor to guide or monitor treatment for MRSA infections. Performed at [facility]  Lab, [address] [place], [place] [address]      Time coordinating discharge: Over 30 minutes  SIGNED:   Desiree Hane, MD  [facility] 01/12/2019, 3:16 PM Pager   If 7PM-7AM, please contact night-coverage [email or web address] Password [email or web address]

## 2019-01-17 ENCOUNTER — Other Ambulatory Visit: Payer: Self-pay | Admitting: Endocrinology

## 2019-01-17 MED FILL — ?AMLODIPINE BESYLATE 10 MG: 10 | 30 days supply | Qty: 30 | Fill #1

## 2019-01-17 MED FILL — ?CARVEDILOL 25MG TABLE: 25 | 30 days supply | Qty: 90 | Fill #2

## 2019-01-19 ENCOUNTER — Other Ambulatory Visit: Payer: Self-pay | Admitting: Endocrinology

## 2019-01-19 ENCOUNTER — Inpatient Hospital Stay (HOSPITAL_COMMUNITY): Admission: RE | Admit: 2019-01-19 | Payer: Self-pay | Source: Ambulatory Visit

## 2019-01-19 ENCOUNTER — Other Ambulatory Visit: Payer: Self-pay | Admitting: Pharmacist

## 2019-01-19 MED FILL — $novoLOG FLEXPEN SYRINGE: 100 | 28 days supply | Qty: 12 | Fill #0

## 2019-01-19 MED FILL — ?BASAGLAR 100 UNITS/ML KWPE: 100 | 30 days supply | Qty: 15 | Fill #0

## 2019-01-20 ENCOUNTER — Other Ambulatory Visit: Payer: Self-pay

## 2019-01-20 ENCOUNTER — Other Ambulatory Visit (HOSPITAL_COMMUNITY)
Admission: RE | Admit: 2019-01-20 | Discharge: 2019-01-20 | Disposition: A | Discharge status: Home / Self Care | Payer: Medicaid Other | Source: Ambulatory Visit | Attending: Surgery | Admitting: Surgery

## 2019-01-20 ENCOUNTER — Encounter (HOSPITAL_COMMUNITY): Payer: Self-pay | Admitting: Surgery

## 2019-01-20 DIAGNOSIS — Z01812 Encounter for preprocedural laboratory examination: Secondary | ICD-10-CM | POA: Diagnosis present

## 2019-01-20 DIAGNOSIS — Z20822 Contact with and (suspected) exposure to covid-19: Secondary | ICD-10-CM | POA: Insufficient documentation

## 2019-01-20 LAB — SARS CORONAVIRUS 2 (TAT 6-24 HRS): SARS Coronavirus 2: NEGATIVE

## 2019-01-20 NOTE — Progress Notes (Signed)
Ann Bryan denies chest or shortness of breath. Ann Bryan had Covid test today and  is in quarantine alone.  Ann Bryan travels via [place] and will have to be admitted over night, no one to  Be at home with her. Ann Bryan has type II diabetes, I instructed patient to take 13 units of Basaglar tonight. I instructed patient to check CBG after awaking and every 2 hours until arrival  to the hospital.  I Instructed patient if CBG is less than 70 to drink 1/2 cup of a clear juice. Recheck CBG in 15 minutes then call pre- op desk at [phone] for further instructions. I instructed Ann Bryan to take 1/2 of sliding  scale if CBG is > 220. "Dr Deterding told me my sliding scale is too high, because I have had some low CBGs., but Dr. Dwyane Dee gave me the sliding scale. I told patient she can call the pre- surgery desk  And ask for instructions from the anesthesiologist.

## 2019-01-21 ENCOUNTER — Ambulatory Visit: Payer: Self-pay | Admitting: Internal Medicine

## 2019-01-21 ENCOUNTER — Ambulatory Visit (HOSPITAL_COMMUNITY): Payer: Medicaid Other | Admitting: Anesthesiology

## 2019-01-21 ENCOUNTER — Other Ambulatory Visit: Payer: Self-pay

## 2019-01-21 ENCOUNTER — Observation Stay (HOSPITAL_COMMUNITY)
Admission: RE | Admit: 2019-01-21 | Discharge: 2019-01-22 | Disposition: A | Discharge status: Home / Self Care | Payer: Medicaid Other | Attending: Surgery | Admitting: Surgery

## 2019-01-21 ENCOUNTER — Encounter (HOSPITAL_COMMUNITY): Payer: Self-pay | Admitting: Surgery

## 2019-01-21 ENCOUNTER — Encounter (HOSPITAL_COMMUNITY)
Admission: RE | Disposition: A | Discharge status: Home / Self Care | Payer: Self-pay | Source: Home / Self Care | Attending: Surgery

## 2019-01-21 DIAGNOSIS — Z823 Family history of stroke: Secondary | ICD-10-CM | POA: Insufficient documentation

## 2019-01-21 DIAGNOSIS — R0602 Shortness of breath: Secondary | ICD-10-CM | POA: Diagnosis not present

## 2019-01-21 DIAGNOSIS — Z794 Long term (current) use of insulin: Secondary | ICD-10-CM | POA: Diagnosis not present

## 2019-01-21 DIAGNOSIS — R112 Nausea with vomiting, unspecified: Secondary | ICD-10-CM | POA: Diagnosis not present

## 2019-01-21 DIAGNOSIS — Z8249 Family history of ischemic heart disease and other diseases of the circulatory system: Secondary | ICD-10-CM | POA: Insufficient documentation

## 2019-01-21 DIAGNOSIS — G4700 Insomnia, unspecified: Secondary | ICD-10-CM | POA: Diagnosis not present

## 2019-01-21 DIAGNOSIS — Z888 Allergy status to other drugs, medicaments and biological substances status: Secondary | ICD-10-CM | POA: Insufficient documentation

## 2019-01-21 DIAGNOSIS — E875 Hyperkalemia: Secondary | ICD-10-CM | POA: Diagnosis not present

## 2019-01-21 DIAGNOSIS — E11319 Type 2 diabetes mellitus with unspecified diabetic retinopathy without macular edema: Secondary | ICD-10-CM | POA: Insufficient documentation

## 2019-01-21 DIAGNOSIS — H409 Unspecified glaucoma: Secondary | ICD-10-CM | POA: Diagnosis not present

## 2019-01-21 DIAGNOSIS — N185 Chronic kidney disease, stage 5: Secondary | ICD-10-CM

## 2019-01-21 DIAGNOSIS — Z79899 Other long term (current) drug therapy: Secondary | ICD-10-CM | POA: Diagnosis not present

## 2019-01-21 DIAGNOSIS — E1122 Type 2 diabetes mellitus with diabetic chronic kidney disease: Principal | ICD-10-CM | POA: Insufficient documentation

## 2019-01-21 DIAGNOSIS — N186 End stage renal disease: Secondary | ICD-10-CM

## 2019-01-21 DIAGNOSIS — D631 Anemia in chronic kidney disease: Secondary | ICD-10-CM | POA: Insufficient documentation

## 2019-01-21 DIAGNOSIS — E785 Hyperlipidemia, unspecified: Secondary | ICD-10-CM | POA: Diagnosis not present

## 2019-01-21 DIAGNOSIS — Z833 Family history of diabetes mellitus: Secondary | ICD-10-CM | POA: Insufficient documentation

## 2019-01-21 DIAGNOSIS — Z992 Dependence on renal dialysis: Secondary | ICD-10-CM | POA: Insufficient documentation

## 2019-01-21 DIAGNOSIS — F332 Major depressive disorder, recurrent severe without psychotic features: Secondary | ICD-10-CM | POA: Insufficient documentation

## 2019-01-21 DIAGNOSIS — Z811 Family history of alcohol abuse and dependence: Secondary | ICD-10-CM | POA: Insufficient documentation

## 2019-01-21 DIAGNOSIS — Z818 Family history of other mental and behavioral disorders: Secondary | ICD-10-CM | POA: Diagnosis not present

## 2019-01-21 DIAGNOSIS — Z881 Allergy status to other antibiotic agents status: Secondary | ICD-10-CM | POA: Insufficient documentation

## 2019-01-21 DIAGNOSIS — I120 Hypertensive chronic kidney disease with stage 5 chronic kidney disease or end stage renal disease: Secondary | ICD-10-CM | POA: Insufficient documentation

## 2019-01-21 HISTORY — PX: AV FISTULA PLACEMENT: SHX1204

## 2019-01-21 HISTORY — DX: Personal history of other medical treatment: Z92.89

## 2019-01-21 HISTORY — DX: Polyneuropathy, unspecified: G62.9

## 2019-01-21 HISTORY — DX: Dyspnea, unspecified: R06.00

## 2019-01-21 LAB — GLUCOSE, CAPILLARY
Glucose-Capillary: 176 mg/dL — ABNORMAL HIGH (ref 70–99)
Glucose-Capillary: 202 mg/dL — ABNORMAL HIGH (ref 70–99)
Glucose-Capillary: 159 mg/dL — ABNORMAL HIGH (ref 70–99)
Glucose-Capillary: 267 mg/dL — ABNORMAL HIGH (ref 70–99)
Glucose-Capillary: 61 mg/dL — ABNORMAL LOW (ref 70–99)
Glucose-Capillary: 87 mg/dL (ref 70–99)

## 2019-01-21 LAB — POCT I-STAT, CHEM 8
BUN: 45 mg/dL — ABNORMAL HIGH (ref 6–20)
Calcium, Ion: 0.95 mmol/L — ABNORMAL LOW (ref 1.15–1.40)
Chloride: 97 mmol/L — ABNORMAL LOW (ref 98–111)
Creatinine, Ser: 5.4 mg/dL — ABNORMAL HIGH (ref 0.44–1.00)
Glucose, Bld: 171 mg/dL — ABNORMAL HIGH (ref 70–99)
HCT: 37 % (ref 36.0–46.0)
Hemoglobin: 12.6 g/dL (ref 12.0–15.0)
Potassium: 5.6 mmol/L — ABNORMAL HIGH (ref 3.5–5.1)
Sodium: 135 mmol/L (ref 135–145)
TCO2: 31 mmol/L (ref 22–32)

## 2019-01-21 LAB — HEMOGLOBIN A1C
Hgb A1c MFr Bld: 7.8 % — ABNORMAL HIGH (ref 4.8–5.6)
Mean Plasma Glucose: 177.16 mg/dL

## 2019-01-21 SURGERY — ARTERIOVENOUS (AV) FISTULA CREATION
Anesthesia: Monitor Anesthesia Care | Laterality: Left

## 2019-01-21 MED ORDER — ACETAMINOPHEN 650 MG RE SUPP: 325.0000 mg | RECTAL | Status: DC | PRN

## 2019-01-21 MED ORDER — CARVEDILOL 25 MG PO TABS
37.5000 mg | ORAL_TABLET | Freq: Two times a day (BID) | ORAL | Status: DC
  Administered 2019-01-21 – 2019-01-22 (×2): 37.5 mg via ORAL
  Filled 2019-01-21 (×2): qty 1

## 2019-01-21 MED ORDER — PANTOPRAZOLE SODIUM 40 MG PO TBEC
40.0000 mg | DELAYED_RELEASE_TABLET | Freq: Every day | ORAL | Status: DC
  Administered 2019-01-21 – 2019-01-22 (×2): 40 mg via ORAL
  Filled 2019-01-21 (×2): qty 1

## 2019-01-21 MED ORDER — FENTANYL CITRATE (PF) 250 MCG/5ML IJ SOLN
INTRAMUSCULAR | Status: AC
  Filled 2019-01-21: qty 5

## 2019-01-21 MED ORDER — SODIUM CHLORIDE 0.9 % IV SOLN: INTRAVENOUS | Status: DC | PRN

## 2019-01-21 MED ORDER — ATORVASTATIN CALCIUM 10 MG PO TABS
20.0000 mg | ORAL_TABLET | Freq: Every evening | ORAL | Status: DC
  Administered 2019-01-21: 17:00:00 20 mg via ORAL
  Filled 2019-01-21: qty 2

## 2019-01-21 MED ORDER — PHENOL 1.4 % MT LIQD: 1.0000 | OROMUCOSAL | Status: DC | PRN

## 2019-01-21 MED ORDER — FENTANYL CITRATE (PF) 100 MCG/2ML IJ SOLN
INTRAMUSCULAR | Status: DC | PRN
  Administered 2019-01-21 (×2): 50 ug via INTRAVENOUS

## 2019-01-21 MED ORDER — ONDANSETRON HCL 4 MG/2ML IJ SOLN
4.0000 mg | Freq: Four times a day (QID) | INTRAMUSCULAR | Status: DC | PRN
  Administered 2019-01-21: 20:00:00 4 mg via INTRAVENOUS
  Filled 2019-01-21: qty 2

## 2019-01-21 MED ORDER — MIDAZOLAM HCL 5 MG/5ML IJ SOLN
INTRAMUSCULAR | Status: DC | PRN
  Administered 2019-01-21: 2 mg via INTRAVENOUS

## 2019-01-21 MED ORDER — ONDANSETRON HCL 4 MG/2ML IJ SOLN: 4.0000 mg | Freq: Four times a day (QID) | INTRAMUSCULAR | Status: DC | PRN

## 2019-01-21 MED ORDER — LIDOCAINE-EPINEPHRINE 0.5 %-1:200000 IJ SOLN
INTRAMUSCULAR | Status: AC
  Filled 2019-01-21: qty 1

## 2019-01-21 MED ORDER — OXYCODONE HCL 5 MG PO TABS: 5.0000 mg | ORAL_TABLET | Freq: Once | ORAL | Status: DC | PRN

## 2019-01-21 MED ORDER — CHLORHEXIDINE GLUCONATE 4 % EX LIQD: 60.0000 mL | Freq: Once | CUTANEOUS | Status: DC

## 2019-01-21 MED ORDER — OXYCODONE-ACETAMINOPHEN 5-325 MG PO TABS
1.0000 | ORAL_TABLET | ORAL | Status: DC | PRN
  Administered 2019-01-21 – 2019-01-22 (×4): 2 via ORAL
  Filled 2019-01-21 (×4): qty 2

## 2019-01-21 MED ORDER — AMLODIPINE BESYLATE 5 MG PO TABS
10.0000 mg | ORAL_TABLET | Freq: Every day | ORAL | Status: DC
  Administered 2019-01-21 – 2019-01-22 (×2): 10 mg via ORAL
  Filled 2019-01-21 (×2): qty 2

## 2019-01-21 MED ORDER — ACETAMINOPHEN 325 MG PO TABS: 325.0000 mg | ORAL_TABLET | ORAL | Status: DC | PRN

## 2019-01-21 MED ORDER — HYDROXYZINE HCL 50 MG/ML IM SOLN
50.0000 mg | Freq: Four times a day (QID) | INTRAMUSCULAR | Status: DC | PRN
  Administered 2019-01-21: 14:00:00 50 mg via INTRAMUSCULAR
  Filled 2019-01-21: qty 1

## 2019-01-21 MED ORDER — INSULIN ASPART 100 UNIT/ML ~~LOC~~ SOLN
0.0000 [IU] | Freq: Three times a day (TID) | SUBCUTANEOUS | Status: DC
  Administered 2019-01-21: 17:00:00 8 [IU] via SUBCUTANEOUS
  Administered 2019-01-21 – 2019-01-22 (×2): 3 [IU] via SUBCUTANEOUS

## 2019-01-21 MED ORDER — PROPOFOL 10 MG/ML IV BOLUS
INTRAVENOUS | Status: DC | PRN
  Administered 2019-01-21: 25 mg via INTRAVENOUS

## 2019-01-21 MED ORDER — ZOLPIDEM TARTRATE 5 MG PO TABS: 5.0000 mg | ORAL_TABLET | Freq: Every evening | ORAL | Status: DC | PRN

## 2019-01-21 MED ORDER — SODIUM CHLORIDE 0.9 % IV SOLN
INTRAVENOUS | Status: DC | PRN
  Administered 2019-01-21: 500 mL

## 2019-01-21 MED ORDER — MIDAZOLAM HCL 2 MG/2ML IJ SOLN
INTRAMUSCULAR | Status: AC
  Filled 2019-01-21: qty 2

## 2019-01-21 MED ORDER — GUAIFENESIN-DM 100-10 MG/5ML PO SYRP
15.0000 mL | ORAL_SOLUTION | ORAL | Status: DC | PRN
  Filled 2019-01-21: qty 15

## 2019-01-21 MED ORDER — OXYCODONE-ACETAMINOPHEN 5-325 MG PO TABS: 1.0000 | ORAL_TABLET | Freq: Four times a day (QID) | ORAL | 0 refills | Status: DC | PRN

## 2019-01-21 MED ORDER — CYCLOBENZAPRINE HCL 10 MG PO TABS: 10.0000 mg | ORAL_TABLET | Freq: Three times a day (TID) | ORAL | Status: DC | PRN

## 2019-01-21 MED ORDER — SODIUM CHLORIDE 0.9 % IV SOLN: INTRAVENOUS | Status: DC

## 2019-01-21 MED ORDER — SODIUM CHLORIDE 0.9 % IV SOLN
INTRAVENOUS | Status: AC
  Filled 2019-01-21: qty 1.2

## 2019-01-21 MED ORDER — LABETALOL HCL 5 MG/ML IV SOLN: 10.0000 mg | INTRAVENOUS | Status: DC | PRN

## 2019-01-21 MED ORDER — 0.9 % SODIUM CHLORIDE (POUR BTL) OPTIME
TOPICAL | Status: DC | PRN
  Administered 2019-01-21: 1000 mL

## 2019-01-21 MED ORDER — FENTANYL CITRATE (PF) 100 MCG/2ML IJ SOLN
INTRAMUSCULAR | Status: AC
  Filled 2019-01-21: qty 2

## 2019-01-21 MED ORDER — OXYCODONE HCL 5 MG/5ML PO SOLN: 5.0000 mg | Freq: Once | ORAL | Status: DC | PRN

## 2019-01-21 MED ORDER — METOPROLOL TARTRATE 5 MG/5ML IV SOLN
2.0000 mg | INTRAVENOUS | Status: DC | PRN
  Filled 2019-01-21: qty 5

## 2019-01-21 MED ORDER — HYDRALAZINE HCL 20 MG/ML IJ SOLN: 5.0000 mg | INTRAMUSCULAR | Status: DC | PRN

## 2019-01-21 MED ORDER — LIDOCAINE-EPINEPHRINE 0.5 %-1:200000 IJ SOLN
INTRAMUSCULAR | Status: DC | PRN
  Administered 2019-01-21: 5 mL

## 2019-01-21 MED ORDER — FENTANYL CITRATE (PF) 100 MCG/2ML IJ SOLN
25.0000 ug | INTRAMUSCULAR | Status: DC | PRN
  Administered 2019-01-21: 12:00:00 50 ug via INTRAVENOUS

## 2019-01-21 MED ORDER — CEFAZOLIN SODIUM-DEXTROSE 2-4 GM/100ML-% IV SOLN
2.0000 g | INTRAVENOUS | Status: AC
  Administered 2019-01-21: 10:00:00 2 g via INTRAVENOUS
  Filled 2019-01-21: qty 100

## 2019-01-21 SURGICAL SUPPLY — 31 items
ADH SKN CLS APL DERMABOND .7 (GAUZE/BANDAGES/DRESSINGS) ×1
ARMBAND PINK RESTRICT EXTREMIT (MISCELLANEOUS) ×4 IMPLANT
CANISTER SUCT 3000ML PPV (MISCELLANEOUS) ×3 IMPLANT
CLIP VESOCCLUDE MED 6/CT (CLIP) ×3 IMPLANT
CLIP VESOCCLUDE SM WIDE 6/CT (CLIP) ×3 IMPLANT
COVER PROBE W GEL 5X96 (DRAPES) ×3 IMPLANT
COVER WAND RF STERILE (DRAPES) ×1 IMPLANT
DERMABOND ADVANCED (GAUZE/BANDAGES/DRESSINGS) ×2
DERMABOND ADVANCED .7 DNX12 (GAUZE/BANDAGES/DRESSINGS) ×1 IMPLANT
ELECT REM PT RETURN 9FT ADLT (ELECTROSURGICAL) ×3
ELECTRODE REM PT RTRN 9FT ADLT (ELECTROSURGICAL) ×1 IMPLANT
GLOVE BIOGEL PI IND STRL 7.5 (GLOVE) ×1 IMPLANT
GLOVE BIOGEL PI INDICATOR 7.5 (GLOVE) ×2
GLOVE SURG SS PI 7.5 STRL IVOR (GLOVE) ×3 IMPLANT
GOWN STRL REUS W/ TWL LRG LVL3 (GOWN DISPOSABLE) ×2 IMPLANT
GOWN STRL REUS W/ TWL XL LVL3 (GOWN DISPOSABLE) ×1 IMPLANT
GOWN STRL REUS W/TWL LRG LVL3 (GOWN DISPOSABLE) ×6
GOWN STRL REUS W/TWL XL LVL3 (GOWN DISPOSABLE) ×3
HEMOSTAT SNOW SURGICEL 2X4 (HEMOSTASIS) IMPLANT
KIT BASIN OR (CUSTOM PROCEDURE TRAY) ×3 IMPLANT
KIT TURNOVER KIT B (KITS) ×3 IMPLANT
NS IRRIG 1000ML POUR BTL (IV SOLUTION) ×3 IMPLANT
PACK CV ACCESS (CUSTOM PROCEDURE TRAY) ×3 IMPLANT
PAD ARMBOARD 7.5X6 YLW CONV (MISCELLANEOUS) ×6 IMPLANT
SUT PROLENE 6 0 CC (SUTURE) ×3 IMPLANT
SUT VIC AB 3-0 SH 27 (SUTURE) ×3
SUT VIC AB 3-0 SH 27X BRD (SUTURE) ×1 IMPLANT
SUT VICRYL 4-0 PS2 18IN ABS (SUTURE) ×1 IMPLANT
TOWEL GREEN STERILE (TOWEL DISPOSABLE) ×3 IMPLANT
UNDERPAD 30X30 (UNDERPADS AND DIAPERS) ×3 IMPLANT
WATER STERILE IRR 1000ML POUR (IV SOLUTION) ×3 IMPLANT

## 2019-01-21 NOTE — Op Note (Addendum)
    Patient name: Ann Bryan MRN: [id number] DOB: Oct 01, 1960 Sex: female  01/21/2019 Pre-operative Diagnosis: ESRD Post-operative diagnosis:  Same Surgeon:  [name] Assistants: Arlee Muslim Procedure:   Left brachiocephalic fistula Anesthesia: General Blood Loss: Minimal Specimens: None  Findings: Excellent 5 mm cephalic vein  Indications: Patient comes in for dialysis access  Procedure:  The patient was identified in the holding area and taken to [place] [facility]  The patient was then placed supine on the table. general anesthesia was administered.  The patient was prepped and draped in the usual sterile fashion.  A time out was called and antibiotics were administered.  Ultrasound was used to evaluate the cephalic vein.  It was of excellent caliber beginning at the antecubital crease.  A transverse incision was made just proximal to the antecubital crease.  First dissected out the brachial artery which was a disease free 4 mm artery.  It was encircled proximally distally with Vesseloops.  Next attention was turned towards the cephalic vein.  This was an excellent 5 mm vein.  It was mobilized throughout the length of the incision.  Was marked for orientation.  The vein was ligated distally with 2-0 silk tie.  It distended nicely with heparin saline.  Next, the brachial artery was occluded with vascular clamps.  A #11 blade was used to make an arteriotomy which was extended longitudinally with Potts scissors.  The vein was then cut to the appropriate length and spatulated to fit the size the arteriotomy.  A running anastomosis was created with 6-0 Prolene.  Just prior to completion, the appropriate flushing maneuvers were performed and the anastomosis was completed.  Clamps were released.  There was an excellent thrill within the fistula.  There was a brisk radial artery Doppler signal.  I inspected the course of the vein to make sure there were no kinks.  The wound was then irrigated.   Hemostasis was achieved.  The incision was closed with 2 layers of 3-0 Vicryl followed by Dermabond.  There were no immediate complications.   Disposition: To PACU stable.   Theotis Burrow, M.D., [facility] [place] Office: [phone] Pager:  [phone]

## 2019-01-21 NOTE — Anesthesia Preprocedure Evaluation (Signed)
Anesthesia Evaluation  Patient identified by MRN, date of birth, ID band Patient awake    Reviewed: Allergy & Precautions, H&P , NPO status , Patient's Chart, lab work & pertinent test results  Airway Mallampati: II   Neck ROM: full    Dental   Pulmonary shortness of breath,    breath sounds clear to auscultation       Cardiovascular hypertension,  Rhythm:regular Rate:Normal     Neuro/Psych PSYCHIATRIC DISORDERS Depression    GI/Hepatic   Endo/Other  diabetes, Type 2  Renal/GU ESRFRenal disease     Musculoskeletal   Abdominal   Peds  Hematology   Anesthesia Other Findings   Reproductive/Obstetrics                             Anesthesia Physical Anesthesia Plan  ASA: II  Anesthesia Plan: MAC   Post-op Pain Management:    Induction: Intravenous  PONV Risk Score and Plan: 2 and Ondansetron, Propofol infusion, Midazolam and Treatment may vary due to age or medical condition  Airway Management Planned: Simple Face Mask  Additional Equipment:   Intra-op Plan:   Post-operative Plan:   Informed Consent: I have reviewed the patients History and Physical, chart, labs and discussed the procedure including the risks, benefits and alternatives for the proposed anesthesia with the patient or authorized representative who has indicated his/her understanding and acceptance.       Plan Discussed with: CRNA, Anesthesiologist and Surgeon  Anesthesia Plan Comments:         Anesthesia Quick Evaluation

## 2019-01-21 NOTE — Discharge Instructions (Signed)
° °  [facility]  ° °Discharge Instructions ° °AV Fistula or Graft Surgery for Dialysis Access ° °Please refer to the following instructions for your post-procedure care. Your surgeon or physician assistant will discuss any changes with you. ° °Activity ° °You may drive the day following your surgery, if you are comfortable and no longer taking prescription pain medication. Resume full activity as the soreness in your incision resolves. ° °Bathing/Showering ° °You may shower after you go home. Keep your incision dry for 48 hours. Do not soak in a bathtub, hot tub, or swim until the incision heals completely. You may not shower if you have a hemodialysis catheter. ° °Incision Care ° °Clean your incision with mild soap and water after 48 hours. Pat the area dry with a clean towel. You do not need a bandage unless otherwise instructed. Do not apply any ointments or creams to your incision. You may have skin glue on your incision. Do not peel it off. It will come off on its own in about one week. Your arm may swell a bit after surgery. To reduce swelling use pillows to elevate your arm so it is above your heart. Your doctor will tell you if you need to lightly wrap your arm with an ACE bandage. ° °Diet ° °Resume your normal diet. There are not special food restrictions following this procedure. In order to heal from your surgery, it is CRITICAL to get adequate nutrition. Your body requires vitamins, minerals, and protein. Vegetables are the best source of vitamins and minerals. Vegetables also provide the perfect balance of protein. Processed food has little nutritional value, so try to avoid this. ° °Medications ° °Resume taking all of your medications. If your incision is causing pain, you may take over-the counter pain relievers such as acetaminophen (Tylenol). If you were prescribed a stronger pain medication, please be aware these medications can cause nausea and constipation. Prevent  nausea by taking the medication with a snack or meal. Avoid constipation by drinking plenty of fluids and eating foods with high amount of fiber, such as fruits, vegetables, and grains. Do not take Tylenol if you are taking prescription pain medications. ° ° ° ° °Follow up °Your surgeon may want to see you in the office following your access surgery. If so, this will be arranged at the time of your surgery. ° °Please call us immediately for any of the following conditions: ° °Increased pain, redness, drainage (pus) from your incision site °Fever of 101 degrees or higher °Severe or worsening pain at your incision site °Hand pain or numbness. ° °Reduce your risk of vascular disease: ° °Stop smoking. If you would like help, call [facility] at 1-800-QUIT-NOW (1-800-784-8669) or [place] at 336-586-4000 ° °Manage your cholesterol °Maintain a desired weight °Control your diabetes °Keep your blood pressure down ° °Dialysis ° °It will take several weeks to several months for your new dialysis access to be ready for use. Your surgeon will determine when it is OK to use it. Your nephrologist will continue to direct your dialysis. You can continue to use your Permcath until your new access is ready for use. ° °If you have any questions, please call the office at [phone]. ° °

## 2019-01-21 NOTE — Progress Notes (Signed)
Hypoglycemic Event  CBG: 61  Treatment: 4 oz juice  Symptoms: lightheadedness  Follow-up CBG: Time:2143 CBG Result:87  Possible Reasons for Event: vomitted meal and  took novolog 8 units prior to meal Comments/MD notified: Pt verbalized feeling better. Will continue to monitor. MD not notified. Will follow protocol.    Madriaga-Acosta,Khalie Wince D

## 2019-01-21 NOTE — Transfer of Care (Signed)
2Immediate Anesthesia Transfer of Care Note  Patient: Ann Bryan  Procedure(s) Performed: BRACHIOCEPHALIC ARTERIOVENOUS (AV) FISTULA CREATION LEFT ARM (Left )  Patient Location: PACU 2 Anesthesia Type:MAC  Level of Consciousness: awake and alert   Airway & Oxygen Therapy: Patient Spontanous Breathing and Patient connected to nasal cannula oxygen  Post-op Assessment: Report given to RN, Post -op Vital signs reviewed and stable and Patient moving all extremities  Post vital signs: Reviewed and stable  Last Vitals:  Vitals Value Taken Time  BP 155/66 01/21/19 1112  Temp 36.1 C 01/21/19 1042  Pulse 73 01/21/19 1118  Resp 16 01/21/19 1118  SpO2 92 % 01/21/19 1118  Vitals shown include unvalidated device data.  Last Pain:  Vitals:   01/21/19 1042  TempSrc:   PainSc: 0-No pain         Complications: No apparent anesthesia complications

## 2019-01-21 NOTE — H&P (Signed)
[facility] [place]  Patient name: Ann Bryan MRN: [id number] DOB: 10/06/1960 Sex: female  REQUESTING PROVIDER:  Dr. Joelyn Oms  REASON FOR CONSULT:  Dialysis access  HISTORY OF PRESENT ILLNESS:  Ann Bryan is a 59 y.o. female, who is referred today for dialysis access. She began dialysis through a right sided catheter approximately 2 weeks ago. She is a Tuesday Thursday Saturday dialysis patient. She is right-handed. Her renal failure secondary to diabetes and hypertension. She denies a history of pacemaker or defibrillator implant.  PAST MEDICAL HISTORY       Past Medical History:  Diagnosis Date  . Cataract    right eye  . Depression   . Diabetes mellitus 1986   poorly controlled last hemoglobin A1c 10.5 on January 01, 2010, increase since June 2011 when hemoglobin A1c was 9.3 , determined to be most likely secondary to non-compliance.  . Diabetic nephropathy ([place])   . Diabetic retinopathy   . Glaucoma   . Hyperlipidemia   . Hypertension   . Nausea and vomiting   . Retinopathy    FAMILY HISTORY        Family History  Problem Relation Age of Onset  . Diabetes Mother   . Stroke Mother   . Hypertension Mother   . Alcohol abuse Mother   . Depression Father    Committed suicide  . Alcohol abuse Maternal Uncle    SOCIAL HISTORY:   Social History        Socioeconomic History  . Marital status: Single    Spouse name: Not on file  . Number of children: Not on file  . Years of education: Not on file  . Highest education level: Not on file  Occupational History  . Not on file  Social Needs  . Financial resource strain: Not on file  . Food insecurity    Worry: Not on file    Inability: Not on file  . Transportation needs    Medical: Not on file    Non-medical: Not on file  Tobacco Use  . Smoking status: Never Smoker  . Smokeless tobacco: Never Used  Substance and Sexual Activity  . Alcohol use: No  . Drug use: No  . Sexual activity:  Not Currently  Lifestyle  . Physical activity    Days per week: Not on file    Minutes per session: Not on file  . Stress: Not on file  Relationships  . Social Herbalist on phone: Not on file    Gets together: Not on file    Attends religious service: Not on file    Active member of club or organization: Not on file    Attends meetings of clubs or organizations: Not on file    Relationship status: Not on file  . Intimate partner violence    Fear of current or ex partner: Not on file    Emotionally abused: Not on file    Physically abused: Not on file    Forced sexual activity: Not on file  Other Topics Concern  . Not on file  Social History Narrative  . Not on file   ALLERGIES:        Allergies  Allergen Reactions  . Doxycycline     REACTION: N/V  . Victoza [Liraglutide] Nausea And Vomiting   CURRENT MEDICATIONS:         Current Outpatient Medications  Medication Sig Dispense Refill  . amLODipine (NORVASC) 10 MG  tablet TAKE 1 TABLET (10 MG TOTAL) BY MOUTH DAILY. 30 tablet 2  . atorvastatin (LIPITOR) 20 MG tablet Take 1 tablet (20 mg total) by mouth daily. 30 tablet 6  . Blood Glucose Monitoring Suppl (TRUE METRIX METER) w/Device KIT 1 each by Does not apply route as needed. 1 kit 0  . calcitRIOL (ROCALTROL) 0.25 MCG capsule Take 0.25 mcg by mouth daily.    . carvedilol (COREG) 25 MG tablet Take 1.5 tablets (37.5 mg total) by mouth 2 (two) times daily with a meal. 180 tablet 2  . cyclobenzaprine (FLEXERIL) 10 MG tablet TAKE 1 TABLET BY MOUTH DAILY AS NEEDED FOR MUSCLE SPASMS. (Patient taking differently: Take 10 mg by mouth 3 (three) times daily as needed for muscle spasms. ) 10 tablet 0  . glucose blood (TRUE METRIX BLOOD GLUCOSE TEST) test strip 1 each by Other route 3 (three) times daily. 100 each 11  . glucose blood test strip Use as instructed 100 each 12  . hydrALAZINE (APRESOLINE) 50 MG tablet Take 1 tablet (50 mg total) by mouth 3 (three) times daily.  90 tablet 6  . insulin aspart (NOVOLOG FLEXPEN) 100 UNIT/ML FlexPen Inject 10-14 Units into the skin 3 (three) times daily with meals. INJECT 10-14 UNITS UNDER THE SKIN 3 TIMES DAILY WITH MEALS. 5 pen 3  . Insulin Glargine (BASAGLAR KWIKPEN) 100 UNIT/ML SOPN INJECT 50 UNITS INTO THE SKIN DAILY (Patient taking differently: Inject 38 Units into the skin at bedtime. ) 15 mL 3  . Insulin Syringe-Needle U-100 (INSULIN SYRINGE 1CC/31GX5/16") 31G X 5/16" 1 ML MISC Use as directed. BD 45m x 31G needles 100 each 6  . Insulin Syringes, Disposable, U-100 0.5 ML MISC 1 each by Does not apply route 2 (two) times daily. 100 each 8  . metoCLOPramide (REGLAN) 5 MG tablet Take 1 tablet (5 mg total) by mouth 3 (three) times daily before meals. 90 tablet 1  . ondansetron (ZOFRAN ODT) 4 MG disintegrating tablet Take 1 tablet (4 mg total) by mouth every 8 (eight) hours as needed for nausea or vomiting. 10 tablet 0  . TRUEPLUS LANCETS 28G MISC 1 each by Does not apply route 3 (three) times daily. 100 each 12  . TRUEPLUS PEN NEEDLES 31G X 5 MM MISC USE AS DIRECTED 100 each 6  . Vitamin D, Ergocalciferol, (DRISDOL) 1.25 MG (50000 UT) CAPS capsule Take 50,000 Units by mouth once a week.    . zolpidem (AMBIEN) 5 MG tablet Take 1 tablet (5 mg total) by mouth at bedtime as needed. Each prescription to last a month. (Patient taking differently: Take 5 mg by mouth at bedtime as needed for sleep. Each prescription to last a month.) 12 tablet 1   No current facility-administered medications for this visit.    REVIEW OF SYSTEMS:  _0  denotes positive finding, _1  denotes negative finding  Cardiac  Comments:  Chest pain or chest pressure:    Shortness of breath upon exertion:    Short of breath when lying flat:    Irregular heart rhythm:        Vascular    Pain in calf, thigh, or hip brought on by ambulation:    Pain in feet at night that wakes you up from your sleep:     Blood clot in your veins:    Leg swelling:           Pulmonary    Oxygen at home:    Productive cough:  Wheezing:         Neurologic    Sudden weakness in arms or legs:     Sudden numbness in arms or legs:     Sudden onset of difficulty speaking or slurred speech:    Temporary loss of vision in one eye:     Problems with dizziness:         Gastrointestinal    Blood in stool:     Vomited blood:         Genitourinary    Burning when urinating:     Blood in urine:        Psychiatric    Major depression:         Hematologic    Bleeding problems:    Problems with blood clotting too easily:        Skin    Rashes or ulcers:        Constitutional    Fever or chills:     PHYSICAL EXAM:      Vitals:   12/06/18 1415  BP: (!) 147/70  Pulse: 68  Resp: 18  Temp: 97.7 F (36.5 C)  SpO2: 96%  Weight: 160 lb (72.6 kg)  Height: 5' (1.524 m)   GENERAL: The patient is a well-nourished female, in no acute distress. The vital signs are documented above.  CARDIAC: There is a regular rate and rhythm.  VASCULAR: Palpable left brachial and radial pulse  PULMONARY: Nonlabored respirations  MUSCULOSKELETAL: There are no major deformities or cyanosis.  NEUROLOGIC: No focal weakness or paresthesias are detected.  SKIN: There are no ulcers or rashes noted.  PSYCHIATRIC: The patient has a normal affect.  STUDIES:  I have reviewed her vascular studies with the following findings:  ArterRight Pre-Dialysis Findings:  +-----------------------+----------+--------------------+---------+--------+  Location PSV (cm/s)Intralum. Diam. (cm)Waveform Comments  +-----------------------+----------+--------------------+---------+--------+  Brachial Antecub. fossa98 0.42 triphasic   +-----------------------+----------+--------------------+---------+--------+  Radial Art at Wrist 109 0.21 triphasic   +-----------------------+----------+--------------------+---------+--------+  Ulnar Art at Wrist 95 0.18 triphasic    +-----------------------+----------+--------------------+---------+--------+  +------------+-----------------+  Blazina's TestBranches at fossa  +------------+-----------------+  Left Pre-Dialysis Findings:  +-----------------------+----------+--------------------+---------+--------+  Location PSV (cm/s)Intralum. Diam. (cm)Waveform Comments  +-----------------------+----------+--------------------+---------+--------+  Brachial Antecub. fossa112 0.39 triphasic   +-----------------------+----------+--------------------+---------+--------+  Radial Art at Wrist 124 0.23 triphasic   +-----------------------+----------+--------------------+---------+--------+  Ulnar Art at Wrist 71 0.13 triphasic   +-----------------------+----------+--------------------+---------+--------+  +-----------------+-------------+----------+---------+  Right Cephalic Diameter (cm)Depth (cm)Findings   +-----------------+-------------+----------+---------+  Shoulder  0.37     +-----------------+-------------+----------+---------+  Prox upper arm  0.38     +-----------------+-------------+----------+---------+  Mid upper arm  0.38     +-----------------+-------------+----------+---------+  Dist upper arm  0.33 / 0.39  branching  +-----------------+-------------+----------+---------+  Antecubital fossa 0.26 / 0.35     +-----------------+-------------+----------+---------+  Prox forearm  0.37 / 0.23  branching  +-----------------+-------------+----------+---------+  Mid forearm  0.23     +-----------------+-------------+----------+---------+  Dist forearm  0.22     +-----------------+-------------+----------+---------+  Wrist  0.26     +-----------------+-------------+----------+---------+  +-----------------+------------------+----------+-------------------+  Right Basilic  Diameter (cm) Depth (cm) Findings    +-----------------+------------------+----------+-------------------+  Shoulder    not visualized   +-----------------+------------------+----------+-------------------+  Prox upper arm    not visualized   +-----------------+------------------+----------+-------------------+  Mid upper arm  0.45  branching and joins  +-----------------+------------------+----------+-------------------+  Dist upper arm 0.41 / 0.38 / 0.52  branching   +-----------------+------------------+----------+-------------------+  Antecubital fossa 0.30     +-----------------+------------------+----------+-------------------+  Prox forearm  0.30     +-----------------+------------------+----------+-------------------+  +-----------------+-------------+----------+-----------------+  Left Cephalic Diameter (cm)Depth (cm) Findings   +-----------------+-------------+----------+-----------------+  Shoulder  0.29     +-----------------+-------------+----------+-----------------+  Prox upper arm  0.33 / 0.20     +-----------------+-------------+----------+-----------------+  Mid upper arm  0.32     +-----------------+-------------+----------+-----------------+  Dist upper arm  0.29     +-----------------+-------------+----------+-----------------+  Antecubital fossa 0.30  branching and NWV  +-----------------+-------------+----------+-----------------+  Prox forearm  0.24     +-----------------+-------------+----------+-----------------+  Mid forearm  0.21     +-----------------+-------------+----------+-----------------+  Dist forearm  0.23     +-----------------+-------------+----------+-----------------+  +-----------------+-------------+----------+---------+  Left Basilic Diameter (cm)Depth (cm)Findings   +-----------------+-------------+----------+---------+  Prox upper arm  0.50 / 0.44      +-----------------+-------------+----------+---------+  Mid upper arm  0.34/ 0.43   joins   +-----------------+-------------+----------+---------+  Dist upper arm  0.26  branching  +-----------------+-------------+----------+---------+  Antecubital fossa 0.23 / 0.35  branching  +-----------------+-------------+----------+---------+  Prox forearm  0.19     +-----------------+-------------+----------+---------+  Should  ASSESSMENT and PLAN  End-stage renal disease: We discussed proceeding with a left brachiocephalic versus first stage basilic vein fistula creation. I discussed the risk of not maturity as well as the need for additional interventions. We also discussed the risk of steal syndrome. All of her questions were answered. Her procedure has been scheduled for Wednesday, December 16  Leia Alf, MD, FACS  [facility] [phone]  Pager [phone]    HPI:  59 yo with ESRD in need of peripheral access.  Currently using a catheter on t, th, sat.    PE:  RRR PULM:CTA ABD: SOFT  Plan:  Left RCF vs BCF.  Again discussed details of procedure and risks and benefits.  She will be kept overnight  [place] Atiana Levier

## 2019-01-21 NOTE — Progress Notes (Signed)
Potassium 5.6 resulted on I-Stat.  Dr. Marcie Bal aware, no orders given.

## 2019-01-22 DIAGNOSIS — E1122 Type 2 diabetes mellitus with diabetic chronic kidney disease: Secondary | ICD-10-CM | POA: Diagnosis not present

## 2019-01-22 LAB — CBC
HCT: 37.7 % (ref 36.0–46.0)
Hemoglobin: 11.7 g/dL — ABNORMAL LOW (ref 12.0–15.0)
MCH: 27.3 pg (ref 26.0–34.0)
MCHC: 31 g/dL (ref 30.0–36.0)
MCV: 88.1 fL (ref 80.0–100.0)
Platelets: 260 10*3/uL (ref 150–400)
RBC: 4.28 MIL/uL (ref 3.87–5.11)
RDW: 15.6 % — ABNORMAL HIGH (ref 11.5–15.5)
WBC: 7.6 10*3/uL (ref 4.0–10.5)
nRBC: 0 % (ref 0.0–0.2)

## 2019-01-22 LAB — BASIC METABOLIC PANEL
Anion gap: 16 — ABNORMAL HIGH (ref 5–15)
BUN: 47 mg/dL — ABNORMAL HIGH (ref 6–20)
CO2: 26 mmol/L (ref 22–32)
Calcium: 8.5 mg/dL — ABNORMAL LOW (ref 8.9–10.3)
Chloride: 96 mmol/L — ABNORMAL LOW (ref 98–111)
Creatinine, Ser: 8 mg/dL — ABNORMAL HIGH (ref 0.44–1.00)
GFR calc Af Amer: 6 mL/min — ABNORMAL LOW (ref >60)
GFR calc non Af Amer: 5 mL/min — ABNORMAL LOW (ref >60)
Glucose, Bld: 181 mg/dL — ABNORMAL HIGH (ref 70–99)
Potassium: 5.8 mmol/L — ABNORMAL HIGH (ref 3.5–5.1)
Sodium: 138 mmol/L (ref 135–145)

## 2019-01-22 LAB — GLUCOSE, CAPILLARY: Glucose-Capillary: 190 mg/dL — ABNORMAL HIGH (ref 70–99)

## 2019-01-22 NOTE — Anesthesia Postprocedure Evaluation (Signed)
Anesthesia Post Note  Patient: Ann Bryan  Procedure(s) Performed: BRACHIOCEPHALIC ARTERIOVENOUS (AV) FISTULA CREATION LEFT ARM (Left )     Patient location during evaluation: PACU Anesthesia Type: MAC Level of consciousness: awake and alert Pain management: pain level controlled Vital Signs Assessment: post-procedure vital signs reviewed and stable Respiratory status: spontaneous breathing, nonlabored ventilation, respiratory function stable and patient connected to nasal cannula oxygen Cardiovascular status: stable and blood pressure returned to baseline Postop Assessment: no apparent nausea or vomiting Anesthetic complications: no    Last Vitals:  Vitals:   01/21/19 2327 01/22/19 0343  BP: (!) 161/70 (!) 167/63  Pulse: 70 73  Resp: 18 18  Temp: 36.9 C 37 C  SpO2: 95% 94%    Last Pain:  Vitals:   01/22/19 0410  TempSrc:   PainSc: [name]

## 2019-01-22 NOTE — Progress Notes (Signed)
Patient is discharged from room 3C05 at this time. Alert and in stable condition. IV site d/c'd and instructions read to patient with understanding verbalized. Left unit via wheelchair with all belongings at side. 

## 2019-01-22 NOTE — Discharge Summary (Signed)
Discharge Summary    Ann Bryan 1960/07/05 59 y.o. female  [id number]  Admission Date: 01/21/2019  Discharge Date: 01/22/2019  Physician: Serafina Mitchell, MD  Admission Diagnosis: ESRD (end stage renal disease) on dialysis [facility]) [N18.6, Z99.2]   HPI:   This is a 59 y.o. female who is referred today for dialysis access. She began dialysis through a right sided catheter approximately 2 weeks ago. She is a Tuesday Thursday Saturday dialysis patient. She is right-handed. Her renal failure secondary to diabetes and hypertension. She denies a history of pacemaker or defibrillator implant.   Hospital Course:  The patient was admitted to the hospital and taken to the operating room on 01/21/2019 and underwent: Left brachiocephalic AVF    The pt tolerated the procedure well and was transported to the PACU in good condition.   By POD 1, pt doing well with excellent thrill.  Palpable radial pulse felt.   The remainder of the hospital course consisted of increasing mobilization and increasing intake of solids without difficulty.  CBC    Component Value Date/Time   WBC 7.6 01/22/2019 0540   RBC 4.28 01/22/2019 0540   HGB 11.7 (L) 01/22/2019 0540   HGB 12.0 11/02/2017 1651   HCT 37.7 01/22/2019 0540   HCT 36.6 11/02/2017 1651   PLT 260 01/22/2019 0540   PLT 298 11/02/2017 1651   MCV 88.1 01/22/2019 0540   MCV 85 11/02/2017 1651   MCH 27.3 01/22/2019 0540   MCHC 31.0 01/22/2019 0540   RDW 15.6 (H) 01/22/2019 0540   RDW 13.1 11/02/2017 1651   LYMPHSABS 3.5 10/08/2010 1612   MONOABS 0.3 10/08/2010 1612   EOSABS 0.1 10/08/2010 1612   BASOSABS 0.0 10/08/2010 1612    BMET    Component Value Date/Time   NA 138 01/22/2019 0540   NA 143 11/02/2017 1651   K 5.8 (H) 01/22/2019 0540   CL 96 (L) 01/22/2019 0540   CO2 26 01/22/2019 0540   GLUCOSE 181 (H) 01/22/2019 0540   BUN 47 (H) 01/22/2019 0540   BUN 64 (H) 11/02/2017 1651   CREATININE 8.00 (H) 01/22/2019 0540   CREATININE 1.69 (H) 12/10/2015 1145   CALCIUM 8.5 (L) 01/22/2019 0540   GFRNONAA 5 (L) 01/22/2019 0540   GFRNONAA 34 (L) 12/10/2015 1145   GFRAA 6 (L) 01/22/2019 0540   GFRAA 39 (L) 12/10/2015 1145      Discharge Instructions    Discharge patient   Complete by: As directed    Discharge disposition: 01-Home or Self Care   Discharge patient date: 01/22/2019      Discharge Diagnosis:  ESRD (end stage renal disease) on dialysis ([place]) [N18.6, Z99.2]  Secondary Diagnosis: Patient Active Problem List   Diagnosis Date Noted  . Hyperkalemia, diminished renal excretion 01/11/2019  . ESRD (end stage renal disease) on dialysis ([place]) 11/18/2018  . Normocytic anemia 11/18/2018  . Diabetes mellitus without complication ([place]) 51/02/5850  . Nausea and vomiting 09/28/2017  . Insomnia 06/05/2017  . Chronic seasonal allergic rhinitis 12/10/2015  . Arthralgia of both hands 12/10/2015  . Severe episode of recurrent major depressive disorder, without psychotic features ([place]) 12/10/2015  . NEPHROPATHY, DIABETIC 09/23/2006  . Essential hypertension 12/23/2005  . DM (diabetes mellitus) type II uncontrolled with eye manifestation ([place]) 01/07/1984   Past Medical History:  Diagnosis Date  . Anemia   . Cataract    right eye  . Depression    at times  . Diabetes mellitus 1986    poorly controlled  last hemoglobin A1c  10.5 on January 01, 2010, increase since June 2011 when hemoglobin A1c was 9.3 , determined to be most likely secondary to non-compliance.  . Diabetic nephropathy ([place])   . Diabetic retinopathy   . Dyspnea    "when I have too much fluid"  . End stage renal disease on dialysis [facility])    T/Th/Sat [address]  . Glaucoma   . History of blood transfusion   . Hyperlipidemia   . Hypertension   . Nausea and vomiting   . Neuropathy   . Retinopathy      Allergies as of 01/22/2019      Reactions   Biopatch Protective Disk-chg [chlorhexidine]    States rash with use of CHG bath    Doxycycline Nausea And Vomiting   Victoza [liraglutide] Nausea And Vomiting      Medication List    TAKE these medications   amLODipine 10 MG tablet Commonly known as: NORVASC TAKE 1 TABLET (10 MG TOTAL) BY MOUTH DAILY. What changed: additional instructions   atorvastatin 20 MG tablet Commonly known as: LIPITOR Take 1 tablet (20 mg total) by mouth daily. What changed: when to take this   Basaglar KwikPen 100 UNIT/ML Sopn INJECT 50 UNITS INTO THE SKIN DAILY What changed: See the new instructions.   carvedilol 25 MG tablet Commonly known as: COREG Take 1.5 tablets (37.5 mg total) by mouth 2 (two) times daily with a meal.   cyclobenzaprine 10 MG tablet Commonly known as: FLEXERIL TAKE 1 TABLET BY MOUTH DAILY AS NEEDED FOR MUSCLE SPASMS. What changed: See the new instructions.   glucose blood test strip Commonly known as: True Metrix Blood Glucose Test 1 each by Other route 3 (three) times daily.   glucose blood test strip Use as instructed   INSULIN SYRINGE 1CC/31GX5/16" 31G X 5/16" 1 ML Misc Use as directed.  BD 11m x 31G needles   Insulin Syringes (Disposable) U-100 0.5 ML Misc 1 each by Does not apply route 2 (two) times daily.   multivitamin Tabs tablet Take 1 tablet by mouth daily.   NovoLOG FlexPen 100 UNIT/ML FlexPen Generic drug: insulin aspart INJECT 10-14 UNITS INTO THE SKIN THREE TIMES DAILY WITH MEALS.   oxyCODONE-acetaminophen 5-325 MG tablet Commonly known as: PERCOCET/ROXICET Take 1 tablet by mouth every 6 (six) hours as needed for moderate pain.   True Metrix Meter w/Device Kit 1 each by Does not apply route as needed.   TRUEplus Lancets 28G Misc 1 each by Does not apply route 3 (three) times daily.   TRUEplus Pen Needles 31G X 5 MM Misc Generic drug: Insulin Pen Needle USE AS DIRECTED   zolpidem 5 MG tablet Commonly known as: AMBIEN Take 1 tablet (5 mg total) by mouth at bedtime as needed. Each prescription to last a month. What  changed: reasons to take this       Prescriptions given: Roxicet #12 No Refill  Instructions:   [facility] Discharge Instructions  AV Fistula or Graft Surgery for Dialysis Access  Please refer to the following instructions for your post-procedure care. Your surgeon or physician assistant will discuss any changes with you.  Activity  You may drive the day following your surgery, if you are comfortable and no longer taking prescription pain medication. Resume full activity as the soreness in your incision resolves.  Bathing/Showering  You may shower after you go home. Keep your incision dry for 48 hours. Do not soak in a  bathtub, hot tub, or swim until the incision heals completely. You may not shower if you have a hemodialysis catheter.  Incision Care  Clean your incision with mild soap and water after 48 hours. Pat the area dry with a clean towel. You do not need a bandage unless otherwise instructed. Do not apply any ointments or creams to your incision. You may have skin glue on your incision. Do not peel it off. It will come off on its own in about one week. Your arm may swell a bit after surgery. To reduce swelling use pillows to elevate your arm so it is above your heart. Your doctor will tell you if you need to lightly wrap your arm with an ACE bandage.  Diet  Resume your normal diet. There are not special food restrictions following this procedure. In order to heal from your surgery, it is CRITICAL to get adequate nutrition. Your body requires vitamins, minerals, and protein. Vegetables are the best source of vitamins and minerals. Vegetables also provide the perfect balance of protein. Processed food has little nutritional value, so try to avoid this.  Medications  Resume taking all of your medications. If your incision is causing pain, you may take over-the counter pain relievers such as acetaminophen (Tylenol). If you were prescribed a  stronger pain medication, please be aware these medications can cause nausea and constipation. Prevent nausea by taking the medication with a snack or meal. Avoid constipation by drinking plenty of fluids and eating foods with high amount of fiber, such as fruits, vegetables, and grains.  Do not take Tylenol if you are taking prescription pain medications.  Follow up Your surgeon may want to see you in the office following your access surgery. If so, this will be arranged at the time of your surgery.  Please call us immediately for any of the following conditions:  . Increased pain, redness, drainage (pus) from your incision site . Fever of 101 degrees or higher . Severe or worsening pain at your incision site . Hand pain or numbness. .  Reduce your risk of vascular disease:  . Stop smoking. If you would like help, call [facility] at 1-800-QUIT-NOW (540)212-3276) or [place] at 9256189603  . Manage your cholesterol . Maintain a desired weight . Control your diabetes . Keep your blood pressure down  Dialysis  It will take several weeks to several months for your new dialysis access to be ready for use. Your surgeon will determine when it is okay to use it. Your nephrologist will continue to direct your dialysis. You can continue to use your Permcath until your new access is ready for use.   01/22/2019 Ann Bryan [id number] July 08, 1960  Surgeon(s): Serafina Mitchell, MD  Procedure(s): BRACHIOCEPHALIC ARTERIOVENOUS (AV) FISTULA CREATION LEFT ARM  x Do not stick fistula for 12 weeks    If you have any questions, please call the office at [phone].    Disposition: home  Patient's condition: is good  Follow up: 1. Dr. Trula Slade in 5 weeks   Leontine Locket, PA-C [facility] [phone] 01/22/2019  8:58 AM

## 2019-01-22 NOTE — Progress Notes (Signed)
[facility] [place]  Subjective  -no complaints.  Left hand feels fine no numbness or tingling.   Objective (!) 181/66 84 99 F (37.2 C) (Oral) 16 94%  Intake/Output Summary (Last 24 hours) at 01/22/2019 0857 Last data filed at 01/22/2019 0824 Gross per 24 hour  Intake 240 ml  Output 10 ml  Net 230 ml    Left brachiocephalic fistula with good thrill and palpable radial pulse at the wrist.  Laboratory Lab Results: Recent Labs    01/21/19 0745 01/22/19 0540  WBC  --  7.6  HGB 12.6 11.7*  HCT 37.0 37.7  PLT  --  260   BMET Recent Labs    01/21/19 0745 01/22/19 0540  NA 135 138  K 5.6* 5.8*  CL 97* 96*  CO2  --  26  GLUCOSE 171* 181*  BUN 45* 47*  CREATININE 5.40* 8.00*  CALCIUM  --  8.5*    COAG Lab Results  Component Value Date   INR 1.1 11/15/2018   No results found for: PTT  Assessment/Planning:  59 year old female with end-stage renal disease postop day 1 status post left brachiocephalic AV fistula.  Excellent thrill in the fistula.  Incision looks good.  Palpable radial pulse at the wrist.  No numbness or tingling or other steal symptoms.  Plan for discharge today.  Follow-up arranged.  Marty Heck 01/22/2019 8:57 AM --

## 2019-01-24 ENCOUNTER — Other Ambulatory Visit: Payer: Self-pay | Admitting: Internal Medicine

## 2019-01-24 MED ORDER — NOVOLOG FLEXPEN 100 UNIT/ML ~~LOC~~ SOPN: PEN_INJECTOR | SUBCUTANEOUS | 6 refills | Status: DC

## 2019-01-24 MED ORDER — BASAGLAR KWIKPEN 100 UNIT/ML ~~LOC~~ SOPN: 26.0000 [IU] | PEN_INJECTOR | Freq: Every day | SUBCUTANEOUS | 6 refills | Status: DC

## 2019-01-25 NOTE — Progress Notes (Signed)
Wasted 50 Fent mcg with Genia Hotter

## 2019-01-31 ENCOUNTER — Other Ambulatory Visit: Payer: Self-pay | Admitting: Internal Medicine

## 2019-01-31 DIAGNOSIS — R252 Cramp and spasm: Secondary | ICD-10-CM

## 2019-01-31 MED FILL — $novoLOG FLEXPEN SYRINGE: 100 | 28 days supply | Qty: 12 | Fill #0

## 2019-01-31 MED FILL — ?BASAGLAR 100 UNITS/ML KWPE: 100 | 30 days supply | Qty: 15 | Fill #0

## 2019-02-01 MED FILL — CYCLOBENZAPRINE 10 MG TAB: 10 | 10 days supply | Qty: 10 | Fill #0

## 2019-03-02 MED FILL — CARVEDILOL 25 MG TABLET: 25 | 30 days supply | Qty: 90 | Fill #3

## 2019-03-02 MED FILL — AMLODIPINE BESYLATE 10 MG T: 10 | 30 days supply | Qty: 30 | Fill #2

## 2019-03-04 ENCOUNTER — Telehealth (HOSPITAL_COMMUNITY): Payer: Self-pay

## 2019-03-04 MED FILL — LANTUS SOLOSTAR 100 UNITS/M: 100 | 30 days supply | Qty: 15 | Fill #1

## 2019-03-04 MED FILL — NOVOLOG FLEXPEN SYRINGE: 100 | 28 days supply | Qty: 12 | Fill #1

## 2019-03-04 NOTE — Telephone Encounter (Signed)

## 2019-03-06 NOTE — Progress Notes (Deleted)
POST OPERATIVE OFFICE NOTE    CC:  F/u for surgery  HPI:  This is a 59 y.o. female who is s/p left BC AVF on 01/21/2019 by Dr. Trula Slade.  The pt *** have evidence of steal sx. The pt *** on dialysis.  Allergies  Allergen Reactions  . Biopatch Protective Disk-Chg [Chlorhexidine]     States rash with use of CHG bath  . Doxycycline Nausea And Vomiting  . Victoza [Liraglutide] Nausea And Vomiting    Current Outpatient Medications  Medication Sig Dispense Refill  . amLODipine (NORVASC) 10 MG tablet TAKE 1 TABLET (10 MG TOTAL) BY MOUTH DAILY. (Patient taking differently: Take 10 mg by mouth daily. evening) 30 tablet 2  . atorvastatin (LIPITOR) 20 MG tablet Take 1 tablet (20 mg total) by mouth daily. (Patient taking differently: Take 20 mg by mouth every evening. ) 30 tablet 6  . Blood Glucose Monitoring Suppl (TRUE METRIX METER) w/Device KIT 1 each by Does not apply route as needed. 1 kit 0  . carvedilol (COREG) 25 MG tablet Take 1.5 tablets (37.5 mg total) by mouth 2 (two) times daily with a meal. 180 tablet 2  . cyclobenzaprine (FLEXERIL) 10 MG tablet TAKE 1 TABLET BY MOUTH DAILY AS NEEDED FOR MUSCLE SPASMS. 10 tablet 0  . glucose blood (TRUE METRIX BLOOD GLUCOSE TEST) test strip 1 each by Other route 3 (three) times daily. 100 each 11  . glucose blood test strip Use as instructed 100 each 12  . insulin aspart (NOVOLOG FLEXPEN) 100 UNIT/ML FlexPen INJECT 10-14 UNITS INTO THE SKIN THREE TIMES DAILY WITH MEALS. 6 pen 6  . Insulin Glargine (BASAGLAR KWIKPEN) 100 UNIT/ML SOPN Inject 0.26 mLs (26 Units total) into the skin at bedtime. Taking 26 units at hs 5 pen 6  . Insulin Syringe-Needle U-100 (INSULIN SYRINGE 1CC/31GX5/16") 31G X 5/16" 1 ML MISC Use as directed.  BD 34m x 31G needles 100 each 6  . Insulin Syringes, Disposable, U-100 0.5 ML MISC 1 each by Does not apply route 2 (two) times daily. 100 each 8  . multivitamin (RENA-VIT) TABS tablet Take 1 tablet by mouth daily.    .Marland Kitchen oxyCODONE-acetaminophen (PERCOCET/ROXICET) 5-325 MG tablet Take 1 tablet by mouth every 6 (six) hours as needed for moderate pain. 12 tablet 0  . TRUEPLUS LANCETS 28G MISC 1 each by Does not apply route 3 (three) times daily. 100 each 12  . TRUEPLUS PEN NEEDLES 31G X 5 MM MISC USE AS DIRECTED 100 each 6  . zolpidem (AMBIEN) 5 MG tablet Take 1 tablet (5 mg total) by mouth at bedtime as needed. Each prescription to last a month. (Patient taking differently: Take 5 mg by mouth at bedtime as needed for sleep. Each prescription to last a month.) 12 tablet 1   No current facility-administered medications for this visit.     ROS:  See HPI  Physical Exam:  ***  Incision:  *** Extremities:  There *** a palpable *** pulse.  Motor and sensory *** in tact.  There *** a thrill/bruit present.   Dialysis Duplex on ***: Diameter:  *** Depth:  ***   Assessment/Plan:  This is a 59y.o. female who is s/p:  left BC AVF on 01/21/2019 by Dr. BTrula Slade   -the pt does *** have evidence of steal. -the fistula/graft can be used ***. -If pt has tunneled dialysis catheter and the access has been used successfully to the satisfaction of the dialysis center, the tunneled catheter can be  removed at their discretion.   -the pt will follow up ***   Leontine Locket, PA-C [facility] MD:  Trula Slade

## 2019-03-07 ENCOUNTER — Inpatient Hospital Stay (HOSPITAL_COMMUNITY): Admit: 2019-03-07 | Payer: Self-pay

## 2019-03-07 ENCOUNTER — Other Ambulatory Visit: Payer: Self-pay

## 2019-03-07 DIAGNOSIS — N186 End stage renal disease: Secondary | ICD-10-CM

## 2019-03-07 DIAGNOSIS — Z992 Dependence on renal dialysis: Secondary | ICD-10-CM

## 2019-03-11 ENCOUNTER — Other Ambulatory Visit: Payer: Self-pay | Admitting: Internal Medicine

## 2019-03-11 DIAGNOSIS — E1169 Type 2 diabetes mellitus with other specified complication: Secondary | ICD-10-CM

## 2019-03-11 DIAGNOSIS — E785 Hyperlipidemia, unspecified: Secondary | ICD-10-CM

## 2019-03-11 MED FILL — ATORVASTATIN CALCIUM 20 MG: 20 | 30 days supply | Qty: 30 | Fill #0

## 2019-03-11 MED FILL — AMLODIPINE BESYLATE 10 MG T: 10 | 30 days supply | Qty: 30 | Fill #2

## 2019-03-11 MED FILL — CARVEDILOL 25 MG TABLET: 25 | 30 days supply | Qty: 90 | Fill #3

## 2019-03-15 ENCOUNTER — Other Ambulatory Visit: Payer: Self-pay | Admitting: Internal Medicine

## 2019-03-15 DIAGNOSIS — I10 Essential (primary) hypertension: Secondary | ICD-10-CM

## 2019-03-23 ENCOUNTER — Telehealth: Payer: Self-pay | Admitting: Pharmacist

## 2019-03-23 NOTE — Telephone Encounter (Signed)
Opened in error

## 2019-03-31 ENCOUNTER — Telehealth (HOSPITAL_COMMUNITY): Payer: Self-pay

## 2019-03-31 NOTE — Telephone Encounter (Signed)

## 2019-04-01 ENCOUNTER — Encounter: Payer: Self-pay | Admitting: Physician Assistant

## 2019-04-01 ENCOUNTER — Ambulatory Visit (INDEPENDENT_AMBULATORY_CARE_PROVIDER_SITE_OTHER): Payer: Self-pay | Admitting: Physician Assistant

## 2019-04-01 ENCOUNTER — Ambulatory Visit (HOSPITAL_COMMUNITY)
Admission: RE | Admit: 2019-04-01 | Discharge: 2019-04-01 | Disposition: A | Discharge status: Home / Self Care | Payer: Medicare Other | Source: Ambulatory Visit | Attending: Surgery | Admitting: Surgery

## 2019-04-01 ENCOUNTER — Other Ambulatory Visit: Payer: Self-pay

## 2019-04-01 VITALS — BP 173/72 | HR 76 | Temp 97.3°F | Ht 60.0 in | Wt 154.0 lb

## 2019-04-01 DIAGNOSIS — N186 End stage renal disease: Secondary | ICD-10-CM | POA: Insufficient documentation

## 2019-04-01 DIAGNOSIS — Z992 Dependence on renal dialysis: Secondary | ICD-10-CM | POA: Insufficient documentation

## 2019-04-01 NOTE — Progress Notes (Signed)
POST OPERATIVE OFFICE NOTE    CC:  F/u for surgery  HPI:  This is a 59 y.o. female who is s/p left brachiocephalic AV fistula by Dr. Trula Slade on 01/21/19. She has been dialyzing through a right IJ [facility] on Tuesday,Thursday Saturday. Catheter working well.  She presents for duplex and follow up today. She does not have any steal symptoms. States arm healed well, no numbness, tingling or pain. No coldness in left hand or fingers. She does state that she gets frequent cramps in both right and left hands but that has been occurring far before she started dialysis.   Allergies  Allergen Reactions  . Biopatch Protective Disk-Chg [Chlorhexidine]     States rash with use of CHG bath  . Doxycycline Nausea And Vomiting  . Victoza [Liraglutide] Nausea And Vomiting    Current Outpatient Medications  Medication Sig Dispense Refill  . amLODipine (NORVASC) 10 MG tablet TAKE ONE TABLET BY MOUTH EVERY DAY 30 tablet 0  . atorvastatin (LIPITOR) 20 MG tablet TAKE 1 TABLET (20 MG TOTAL) BY MOUTH DAILY. 30 tablet 1  . CALCIUM ACETATE, PHOS BINDER, PO Take by mouth.    . carvedilol (COREG) 25 MG tablet Take 1.5 tablets (37.5 mg total) by mouth 2 (two) times daily with a meal. (Patient taking differently: Take 37.5 mg by mouth 2 (two) times daily with a meal. Except on dialysis days none that morning) 180 tablet 2  . cyclobenzaprine (FLEXERIL) 10 MG tablet TAKE 1 TABLET BY MOUTH DAILY AS NEEDED FOR MUSCLE SPASMS. 10 tablet 0  . glucose blood (TRUE METRIX BLOOD GLUCOSE TEST) test strip 1 each by Other route 3 (three) times daily. 100 each 11  . glucose blood test strip Use as instructed 100 each 12  . insulin aspart (NOVOLOG FLEXPEN) 100 UNIT/ML FlexPen INJECT 10-14 UNITS INTO THE SKIN THREE TIMES DAILY WITH MEALS. 6 pen 6  . Insulin Glargine (BASAGLAR KWIKPEN) 100 UNIT/ML SOPN Inject 0.26 mLs (26 Units total) into the skin at bedtime. Taking 26 units at hs 5 pen 6  . Insulin Syringe-Needle U-100 (INSULIN SYRINGE  1CC/31GX5/16") 31G X 5/16" 1 ML MISC Use as directed.  BD 41m x 31G needles 100 each 6  . Insulin Syringes, Disposable, U-100 0.5 ML MISC 1 each by Does not apply route 2 (two) times daily. 100 each 8  . TRUEPLUS LANCETS 28G MISC 1 each by Does not apply route 3 (three) times daily. 100 each 12  . TRUEPLUS PEN NEEDLES 31G X 5 MM MISC USE AS DIRECTED 100 each 6  . Blood Glucose Monitoring Suppl (TRUE METRIX METER) w/Device KIT 1 each by Does not apply route as needed. 1 kit 0  . multivitamin (RENA-VIT) TABS tablet Take 1 tablet by mouth daily.    .Marland KitchenoxyCODONE-acetaminophen (PERCOCET/ROXICET) 5-325 MG tablet Take 1 tablet by mouth every 6 (six) hours as needed for moderate pain. (Patient not taking: Reported on 04/01/2019) 12 tablet 0  . zolpidem (AMBIEN) 5 MG tablet Take 1 tablet (5 mg total) by mouth at bedtime as needed. Each prescription to last a month. (Patient not taking: Reported on 04/01/2019) 12 tablet 1   No current facility-administered medications for this visit.     ROS:  See HPI  Non Invasive Vascular study: 04/01/19 [place]:   +------------+---------+------------+-----------+--------------------------  ----+  OUTFLOW VEIN  PSV   Diameter Depth (cm)       Describe               (  cm/s)   (cm)                          +------------+---------+------------+-----------+--------------------------  ----+  Shoulder    115    0.77    0.68                    +------------+---------+------------+-----------+--------------------------  ----+  Prox UA     249    0.52    0.61   competing branch .28 cm  279                                 cm/s         +------------+---------+------------+-----------+--------------------------  ----+  Mid UA     306    0.63    0.49                      +------------+---------+------------+-----------+--------------------------  ----+  Dist UA   238 / 5740.72 / 0.54 0.44 / 0.57competing branch 0.26 138  cm/s  +------------+---------+------------+-----------+--------------------------  ----+     Summary:  Patent arteriovenous fistula with no visualized stenosis. Adequate flow volume  Physical Exam:  Vitals:   04/01/19 1247  BP: (!) 173/72  Pulse: 76  Temp: (!) 97.3 F (36.3 C)  TempSrc: Temporal  SpO2: 99%  Weight: 154 lb (69.9 kg)  Height: 5' (1.524 m)   General: well appearing, well nourished, no in any discomfort Cardiac: right IJ TDC, clean, and intact. Regular rate and rhythm Lungs: non labored Incision:  Left AC incision healed. No tenderness. No swelling Extremities:  2+ radial pulses bilaterally. 2+ ulnar pulse. Left hand warm. Normal grip strength. Normal motor and sensation. Left AV fistula with good thrill, good bruit. Deep in subcutaneous tissue short distance from [facility]: Alert and oriented   Assessment/Plan:  This is a 59 y.o. female who is s/p left brachiocephalic AV fistula. Fistula functioning well with good thrill and bruit. Incision healed. No steal symptoms. Duplex and clinically borderline for access. Discussed with patient likely need for elevation of the fistula but feel that it is reasonable to have her really exercise her left upper extremity and repeat the duplex in 4 weeks to see if it will mature more. She will continue her current dialysis via TDC TTS.   She will follow up in 4 weeks with dialysis duplex   Karoline Caldwell, PA-C [facility] [phone]  Clinic MD:  Donzetta Matters

## 2019-04-04 ENCOUNTER — Other Ambulatory Visit: Payer: Self-pay | Admitting: *Deleted

## 2019-04-04 DIAGNOSIS — Z992 Dependence on renal dialysis: Secondary | ICD-10-CM

## 2019-04-04 DIAGNOSIS — N186 End stage renal disease: Secondary | ICD-10-CM

## 2019-04-29 ENCOUNTER — Ambulatory Visit (HOSPITAL_COMMUNITY): Payer: Medicare Other

## 2019-04-29 ENCOUNTER — Ambulatory Visit: Payer: Medicare Other

## 2019-05-02 ENCOUNTER — Other Ambulatory Visit: Payer: Self-pay

## 2019-05-02 ENCOUNTER — Ambulatory Visit (HOSPITAL_COMMUNITY)
Admission: RE | Admit: 2019-05-02 | Discharge: 2019-05-02 | Disposition: A | Discharge status: Home / Self Care | Payer: Medicare Other | Source: Ambulatory Visit | Attending: Physician Assistant | Admitting: Physician Assistant

## 2019-05-02 ENCOUNTER — Ambulatory Visit (INDEPENDENT_AMBULATORY_CARE_PROVIDER_SITE_OTHER): Payer: Medicare Other | Admitting: Physician Assistant

## 2019-05-02 VITALS — BP 189/69 | HR 77 | Temp 97.9°F | Resp 20 | Ht 60.0 in | Wt 155.3 lb

## 2019-05-02 DIAGNOSIS — N186 End stage renal disease: Secondary | ICD-10-CM

## 2019-05-02 DIAGNOSIS — Z992 Dependence on renal dialysis: Secondary | ICD-10-CM | POA: Diagnosis present

## 2019-05-02 NOTE — H&P (View-Only) (Signed)
POST OPERATIVE OFFICE NOTE    CC:  F/u for surgery  HPI:  This is a 59 y.o. female who is s/p left radiocephalic AV fistula creation 01/21/2019.  She has had HD 2 x using the left fistula that was not successful.  She has cramping in her hands that gets worse some days preventing her from staying still and tolerating HD.  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  She takes Flexeril for the cramping, and has since 2016.The last HD session she got a hematoma because the fistula is not very superficial in the upper half of her arm.  She has HD via right [facility] since Nov. On TTS.  Dr. Jimmy Footman is her primary Nephrologist.  Allergies  Allergen Reactions  . Biopatch Protective Disk-Chg [Chlorhexidine]     States rash with use of CHG bath  . Doxycycline Nausea And Vomiting  . Victoza [Liraglutide] Nausea And Vomiting    Current Outpatient Medications  Medication Sig Dispense Refill  . amLODipine (NORVASC) 10 MG tablet TAKE ONE TABLET BY MOUTH EVERY DAY 30 tablet 0  . atorvastatin (LIPITOR) 20 MG tablet TAKE 1 TABLET (20 MG TOTAL) BY MOUTH DAILY. 30 tablet 1  . Blood Glucose Monitoring Suppl (TRUE METRIX METER) w/Device KIT 1 each by Does not apply route as needed. 1 kit 0  . CALCIUM ACETATE, PHOS BINDER, PO Take by mouth.    . carvedilol (COREG) 25 MG tablet Take 1.5 tablets (37.5 mg total) by mouth 2 (two) times daily with a meal. (Patient taking differently: Take 37.5 mg by mouth 2 (two) times daily with a meal. Except on dialysis days none that morning) 180 tablet 2  . cyclobenzaprine (FLEXERIL) 10 MG tablet TAKE 1 TABLET BY MOUTH DAILY AS NEEDED FOR MUSCLE SPASMS. 10 tablet 0  . glucose blood (TRUE METRIX BLOOD GLUCOSE TEST) test strip 1 each by Other route 3 (three) times daily. 100 each 11  . glucose blood test strip Use as instructed 100 each 12  . insulin aspart (NOVOLOG FLEXPEN) 100 UNIT/ML FlexPen INJECT 10-14 UNITS INTO THE SKIN THREE TIMES DAILY WITH MEALS. 6 pen 6    . Insulin Glargine (BASAGLAR KWIKPEN) 100 UNIT/ML SOPN Inject 0.26 mLs (26 Units total) into the skin at bedtime. Taking 26 units at hs 5 pen 6  . Insulin Syringe-Needle U-100 (INSULIN SYRINGE 1CC/31GX5/16") 31G X 5/16" 1 ML MISC Use as directed.  BD 1m x 31G needles 100 each 6  . Insulin Syringes, Disposable, U-100 0.5 ML MISC 1 each by Does not apply route 2 (two) times daily. 100 each 8  . multivitamin (RENA-VIT) TABS tablet Take 1 tablet by mouth daily.    .Marland KitchenoxyCODONE-acetaminophen (PERCOCET/ROXICET) 5-325 MG tablet Take 1 tablet by mouth every 6 (six) hours as needed for moderate pain. 12 tablet 0  . TRUEPLUS LANCETS 28G MISC 1 each by Does not apply route 3 (three) times daily. 100 each 12  . TRUEPLUS PEN NEEDLES 31G X 5 MM MISC USE AS DIRECTED 100 each 6  . zolpidem (AMBIEN) 5 MG tablet Take 1 tablet (5 mg total) by mouth at bedtime as needed. Each prescription to last a month. 12 tablet 1   No current facility-administered medications for this visit.     ROS:  See HPI +--------------------+----------+-----------------+--------+  AVF         PSV (cm/s)Flow Vol (mL/min)Comments  +--------------------+----------+-----------------+--------+  Native artery inflow  545     1762          +--------------------+----------+-----------------+--------+  AVF Anastomosis     776                 +--------------------+----------+-----------------+--------+     +------------+----------+-------------+----------+----------------+  OUTFLOW VEINPSV (cm/s)Diameter (cm)Depth (cm)  Describe    +------------+----------+-------------+----------+----------------+  Prox UA     229    0.56     0.75  competing branch  +------------+----------+-------------+----------+----------------+  Mid UA     458    0.67     1.09            +------------+----------+-------------+----------+----------------+  Dist  UA     306    0.93     0.47  competing branch  +------------+----------+-------------+----------+----------------+  AC Fossa    524    0.59     0.37            +------------+----------+-------------+----------+----------------+     Summary:  Patent arteriovenous fistula.  Physical Exam:    Incision:  Well healed Extremities:  Grip 5/5, sensation and motor are intact.  Medial UE edema and hematoma. Lungs : CTA non labored breathing Heart :  RRR, without murmer  Assessment/Plan:  This is a 59 y.o. female who is s/p:Left Radiocephalic av fistula creation  She states she has trouble with her hands cramping and drawing up at times.  She also has itching and has trouble staying still when in dialysis.  They are having trouble sticking the fistula.   The fistula has matured well enough, but is deeper than 0.6 in the proximal 1.  She will be scheduled for superficialization of the fistula   Roxy Horseman PA-C [facility] [phone]  Clinic MD:  Trula Slade

## 2019-05-02 NOTE — Progress Notes (Signed)
POST OPERATIVE OFFICE NOTE    CC:  F/u for surgery  HPI:  This is a 59 y.o. female who is s/p left radiocephalic AV fistula creation 01/21/2019.  She has had HD 2 x using the left fistula that was not successful.  She has cramping in her hands that gets worse some days preventing her from staying still and tolerating HD.  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  She takes Flexeril for the cramping, and has since 2016.The last HD session she got a hematoma because the fistula is not very superficial in the upper half of her arm.  She has HD via right [facility] since Nov. On TTS.  Dr. Jimmy Footman is her primary Nephrologist.  Allergies  Allergen Reactions  . Biopatch Protective Disk-Chg [Chlorhexidine]     States rash with use of CHG bath  . Doxycycline Nausea And Vomiting  . Victoza [Liraglutide] Nausea And Vomiting    Current Outpatient Medications  Medication Sig Dispense Refill  . amLODipine (NORVASC) 10 MG tablet TAKE ONE TABLET BY MOUTH EVERY DAY 30 tablet 0  . atorvastatin (LIPITOR) 20 MG tablet TAKE 1 TABLET (20 MG TOTAL) BY MOUTH DAILY. 30 tablet 1  . Blood Glucose Monitoring Suppl (TRUE METRIX METER) w/Device KIT 1 each by Does not apply route as needed. 1 kit 0  . CALCIUM ACETATE, PHOS BINDER, PO Take by mouth.    . carvedilol (COREG) 25 MG tablet Take 1.5 tablets (37.5 mg total) by mouth 2 (two) times daily with a meal. (Patient taking differently: Take 37.5 mg by mouth 2 (two) times daily with a meal. Except on dialysis days none that morning) 180 tablet 2  . cyclobenzaprine (FLEXERIL) 10 MG tablet TAKE 1 TABLET BY MOUTH DAILY AS NEEDED FOR MUSCLE SPASMS. 10 tablet 0  . glucose blood (TRUE METRIX BLOOD GLUCOSE TEST) test strip 1 each by Other route 3 (three) times daily. 100 each 11  . glucose blood test strip Use as instructed 100 each 12  . insulin aspart (NOVOLOG FLEXPEN) 100 UNIT/ML FlexPen INJECT 10-14 UNITS INTO THE SKIN THREE TIMES DAILY WITH MEALS. 6 pen 6    . Insulin Glargine (BASAGLAR KWIKPEN) 100 UNIT/ML SOPN Inject 0.26 mLs (26 Units total) into the skin at bedtime. Taking 26 units at hs 5 pen 6  . Insulin Syringe-Needle U-100 (INSULIN SYRINGE 1CC/31GX5/16") 31G X 5/16" 1 ML MISC Use as directed.  BD 52m x 31G needles 100 each 6  . Insulin Syringes, Disposable, U-100 0.5 ML MISC 1 each by Does not apply route 2 (two) times daily. 100 each 8  . multivitamin (RENA-VIT) TABS tablet Take 1 tablet by mouth daily.    .Marland KitchenoxyCODONE-acetaminophen (PERCOCET/ROXICET) 5-325 MG tablet Take 1 tablet by mouth every 6 (six) hours as needed for moderate pain. 12 tablet 0  . TRUEPLUS LANCETS 28G MISC 1 each by Does not apply route 3 (three) times daily. 100 each 12  . TRUEPLUS PEN NEEDLES 31G X 5 MM MISC USE AS DIRECTED 100 each 6  . zolpidem (AMBIEN) 5 MG tablet Take 1 tablet (5 mg total) by mouth at bedtime as needed. Each prescription to last a month. 12 tablet 1   No current facility-administered medications for this visit.     ROS:  See HPI +--------------------+----------+-----------------+--------+  AVF         PSV (cm/s)Flow Vol (mL/min)Comments  +--------------------+----------+-----------------+--------+  Native artery inflow  545     1762          +--------------------+----------+-----------------+--------+  AVF Anastomosis     776                 +--------------------+----------+-----------------+--------+     +------------+----------+-------------+----------+----------------+  OUTFLOW VEINPSV (cm/s)Diameter (cm)Depth (cm)  Describe    +------------+----------+-------------+----------+----------------+  Prox UA     229    0.56     0.75  competing branch  +------------+----------+-------------+----------+----------------+  Mid UA     458    0.67     1.09            +------------+----------+-------------+----------+----------------+  Dist  UA     306    0.93     0.47  competing branch  +------------+----------+-------------+----------+----------------+  AC Fossa    524    0.59     0.37            +------------+----------+-------------+----------+----------------+     Summary:  Patent arteriovenous fistula.  Physical Exam:    Incision:  Well healed Extremities:  Grip 5/5, sensation and motor are intact.  Medial UE edema and hematoma. Lungs : CTA non labored breathing Heart :  RRR, without murmer  Assessment/Plan:  This is a 59 y.o. female who is s/p:Left Radiocephalic av fistula creation  She states she has trouble with her hands cramping and drawing up at times.  She also has itching and has trouble staying still when in dialysis.  They are having trouble sticking the fistula.   The fistula has matured well enough, but is deeper than 0.6 in the proximal 1.  She will be scheduled for superficialization of the fistula   Roxy Horseman PA-C [facility] [phone]  Clinic MD:  Trula Slade

## 2019-05-04 ENCOUNTER — Other Ambulatory Visit: Payer: Self-pay

## 2019-05-06 ENCOUNTER — Encounter: Payer: Self-pay | Admitting: Internal Medicine

## 2019-05-06 ENCOUNTER — Ambulatory Visit: Payer: Medicare Other | Attending: Internal Medicine | Admitting: Internal Medicine

## 2019-05-06 ENCOUNTER — Other Ambulatory Visit: Payer: Self-pay

## 2019-05-06 VITALS — BP 174/73 | HR 67 | Temp 97.7°F | Resp 16 | Wt 154.0 lb

## 2019-05-06 DIAGNOSIS — N186 End stage renal disease: Secondary | ICD-10-CM | POA: Diagnosis not present

## 2019-05-06 DIAGNOSIS — Z79899 Other long term (current) drug therapy: Secondary | ICD-10-CM | POA: Insufficient documentation

## 2019-05-06 DIAGNOSIS — Z833 Family history of diabetes mellitus: Secondary | ICD-10-CM | POA: Diagnosis not present

## 2019-05-06 DIAGNOSIS — Z992 Dependence on renal dialysis: Secondary | ICD-10-CM | POA: Diagnosis not present

## 2019-05-06 DIAGNOSIS — E1122 Type 2 diabetes mellitus with diabetic chronic kidney disease: Secondary | ICD-10-CM | POA: Insufficient documentation

## 2019-05-06 DIAGNOSIS — Z888 Allergy status to other drugs, medicaments and biological substances status: Secondary | ICD-10-CM | POA: Diagnosis not present

## 2019-05-06 DIAGNOSIS — Z881 Allergy status to other antibiotic agents status: Secondary | ICD-10-CM | POA: Insufficient documentation

## 2019-05-06 DIAGNOSIS — Z8249 Family history of ischemic heart disease and other diseases of the circulatory system: Secondary | ICD-10-CM | POA: Diagnosis not present

## 2019-05-06 DIAGNOSIS — IMO0002 Reserved for concepts with insufficient information to code with codable children: Secondary | ICD-10-CM

## 2019-05-06 DIAGNOSIS — L299 Pruritus, unspecified: Secondary | ICD-10-CM

## 2019-05-06 DIAGNOSIS — G4700 Insomnia, unspecified: Secondary | ICD-10-CM | POA: Insufficient documentation

## 2019-05-06 DIAGNOSIS — E1165 Type 2 diabetes mellitus with hyperglycemia: Secondary | ICD-10-CM | POA: Insufficient documentation

## 2019-05-06 DIAGNOSIS — E1121 Type 2 diabetes mellitus with diabetic nephropathy: Secondary | ICD-10-CM | POA: Diagnosis present

## 2019-05-06 DIAGNOSIS — Z1231 Encounter for screening mammogram for malignant neoplasm of breast: Secondary | ICD-10-CM

## 2019-05-06 DIAGNOSIS — Z794 Long term (current) use of insulin: Secondary | ICD-10-CM | POA: Diagnosis not present

## 2019-05-06 DIAGNOSIS — I120 Hypertensive chronic kidney disease with stage 5 chronic kidney disease or end stage renal disease: Secondary | ICD-10-CM | POA: Diagnosis not present

## 2019-05-06 DIAGNOSIS — I10 Essential (primary) hypertension: Secondary | ICD-10-CM | POA: Diagnosis not present

## 2019-05-06 DIAGNOSIS — D649 Anemia, unspecified: Secondary | ICD-10-CM | POA: Insufficient documentation

## 2019-05-06 LAB — GLUCOSE, POCT (MANUAL RESULT ENTRY): POC Glucose: 277 mg/dl — AB (ref 70–99)

## 2019-05-06 MED ORDER — TRUE METRIX BLOOD GLUCOSE TEST VI STRP: ORAL_STRIP | 12 refills | Status: DC

## 2019-05-06 MED ORDER — TRUE METRIX BLOOD GLUCOSE TEST VI STRP: 1.0000 | ORAL_STRIP | Freq: Three times a day (TID) | 11 refills | Status: DC

## 2019-05-06 MED FILL — METHOCARBAMOL 500 MG TABS: 500 | 25 days supply | Qty: 50 | Fill #0 | Status: TO

## 2019-05-06 NOTE — Progress Notes (Signed)
Patient ID: Ann Bryan, female    DOB: 04/08/1960  MRN: [id number]  CC: Diabetes and Hypertension   Subjective: Ann Bryan is a 59 y.o. female who presents for chronic ds management Her concerns today include:  Patient with history of DM, HTN, HL, seasonal allergies,anxiety/depression, glucoma,nephrotic range proteinuria with ESRDand insomnia.  ESRD: developed itching 2 hrs into HD.  Told her phos is too high and once they get it down, the itching will stop.  Has tried OTC lotion/creams.  DIABETES TYPE 2 Last A1C:   Results for orders placed or performed in visit on 05/06/19  POCT glucose (manual entry)  Result Value Ref Range   POC Glucose 277 (A) 70 - 99 mg/dl    Med Adherence:  '[x]'  Yes  - when I eat.  Novolog avg 10 with meals and Lantus 26 units  '[]'  No Medication side effects:  '[]'  Yes    '[]'  No Home Monitoring?  '[x]'  Yes  1-2 x a day.  Not sure if meter is right but plans to get new one    Home glucose results range: She tells me that her blood sugars fluctuate from being too high or too low.  She does not have a log with her today.  Highest in 500 about 3 times in the past mth and lowest 40 Diet Adherence: "not good because sometimes I am just not hungry."  Exercise: '[]'  Yes    '[x]'  No Hypoglycemic episodes?: '[x]'  Yes    '[]'  No Numbness of the feet? '[x]'  Yes    '[]'  No Retinopathy hx? '[]'  Yes    '[]'  No Last eye exam: over due and now has Medicare Comments:   HTN:  Readings fluctuate HCTZ d/c by nephrologist On Norvasc and Coreg.  Took Coreg this morning; takes Norvasc in evenings.   No chest pains or shortness of breath. Patient Active Problem List   Diagnosis Date Noted  . Hyperkalemia, diminished renal excretion 01/11/2019  . ESRD (end stage renal disease) on dialysis ([place]) 11/18/2018  . Normocytic anemia 11/18/2018  . Diabetes mellitus without complication ([place]) 41/66/0630  . Nausea and vomiting 09/28/2017  . Insomnia 06/05/2017  . Chronic seasonal allergic rhinitis  12/10/2015  . Arthralgia of both hands 12/10/2015  . Severe episode of recurrent major depressive disorder, without psychotic features ([place]) 12/10/2015  . NEPHROPATHY, DIABETIC 09/23/2006  . Essential hypertension 12/23/2005  . DM (diabetes mellitus) type II uncontrolled with eye manifestation ([place]) 01/07/1984     Current Outpatient Medications on File Prior to Visit  Medication Sig Dispense Refill  . amLODipine (NORVASC) 10 MG tablet TAKE ONE TABLET BY MOUTH EVERY DAY 30 tablet 0  . atorvastatin (LIPITOR) 20 MG tablet TAKE 1 TABLET (20 MG TOTAL) BY MOUTH DAILY. 30 tablet 1  . Blood Glucose Monitoring Suppl (TRUE METRIX METER) w/Device KIT 1 each by Does not apply route as needed. 1 kit 0  . CALCIUM ACETATE, PHOS BINDER, PO Take by mouth.    . carvedilol (COREG) 25 MG tablet Take 1.5 tablets (37.5 mg total) by mouth 2 (two) times daily with a meal. (Patient taking differently: Take 37.5 mg by mouth 2 (two) times daily with a meal. Except on dialysis days none that morning) 180 tablet 2  . cyclobenzaprine (FLEXERIL) 10 MG tablet TAKE 1 TABLET BY MOUTH DAILY AS NEEDED FOR MUSCLE SPASMS. 10 tablet 0  . glucose blood (TRUE METRIX BLOOD GLUCOSE TEST) test strip 1 each by Other route 3 (three) times daily.  100 each 11  . glucose blood test strip Use as instructed 100 each 12  . insulin aspart (NOVOLOG FLEXPEN) 100 UNIT/ML FlexPen INJECT 10-14 UNITS INTO THE SKIN THREE TIMES DAILY WITH MEALS. 6 pen 6  . Insulin Glargine (BASAGLAR KWIKPEN) 100 UNIT/ML SOPN Inject 0.26 mLs (26 Units total) into the skin at bedtime. Taking 26 units at hs 5 pen 6  . Insulin Syringe-Needle U-100 (INSULIN SYRINGE 1CC/31GX5/16") 31G X 5/16" 1 ML MISC Use as directed.  BD 72m x 31G needles 100 each 6  . Insulin Syringes, Disposable, U-100 0.5 ML MISC 1 each by Does not apply route 2 (two) times daily. 100 each 8  . multivitamin (RENA-VIT) TABS tablet Take 1 tablet by mouth daily.    .Marland KitchenoxyCODONE-acetaminophen  (PERCOCET/ROXICET) 5-325 MG tablet Take 1 tablet by mouth every 6 (six) hours as needed for moderate pain. (Patient not taking: Reported on 05/06/2019) 12 tablet 0  . TRUEPLUS LANCETS 28G MISC 1 each by Does not apply route 3 (three) times daily. 100 each 12  . TRUEPLUS PEN NEEDLES 31G X 5 MM MISC USE AS DIRECTED 100 each 6  . zolpidem (AMBIEN) 5 MG tablet Take 1 tablet (5 mg total) by mouth at bedtime as needed. Each prescription to last a month. 12 tablet 1   No current facility-administered medications on file prior to visit.    Allergies  Allergen Reactions  . Biopatch Protective Disk-Chg [Chlorhexidine]     States rash with use of CHG bath  . Doxycycline Nausea And Vomiting  . Victoza [Liraglutide] Nausea And Vomiting    Social History   Socioeconomic History  . Marital status: Single    Spouse name: Not on file  . Number of children: Not on file  . Years of education: Not on file  . Highest education level: Not on file  Occupational History  . Not on file  Tobacco Use  . Smoking status: Never Smoker  . Smokeless tobacco: Never Used  Substance and Sexual Activity  . Alcohol use: No  . Drug use: No  . Sexual activity: Not Currently  Other Topics Concern  . Not on file  Social History Narrative  . Not on file   Social Determinants of Health   Financial Resource Strain:   . Difficulty of Paying Living Expenses:   Food Insecurity:   . Worried About RCharity fundraiserin the Last Year:   . RArboriculturistin the Last Year:   Transportation Needs:   . LFilm/video editor(Medical):   .Marland KitchenLack of Transportation (Non-Medical):   Physical Activity:   . Days of Exercise per Week:   . Minutes of Exercise per Session:   Stress:   . Feeling of Stress :   Social Connections:   . Frequency of Communication with Friends and Family:   . Frequency of Social Gatherings with Friends and Family:   . Attends Religious Services:   . Active Member of Clubs or Organizations:    . Attends CArchivistMeetings:   .Marland KitchenMarital Status:   Intimate Partner Violence:   . Fear of Current or Ex-Partner:   . Emotionally Abused:   .Marland KitchenPhysically Abused:   . Sexually Abused:     Family History  Problem Relation Age of Onset  . Diabetes Mother   . Stroke Mother   . Hypertension Mother   . Alcohol abuse Mother   . Depression Father  Committed suicide  . Alcohol abuse Maternal Uncle     Past Surgical History:  Procedure Laterality Date  . ABDOMINAL HYSTERECTOMY   2004  . AV FISTULA PLACEMENT Left 01/21/2019   Procedure: BRACHIOCEPHALIC ARTERIOVENOUS (AV) FISTULA CREATION LEFT ARM;  Surgeon: Serafina Mitchell, MD;  Location: [facility];  Service: Vascular;  Laterality: Left;  . IR FLUORO GUIDE CV LINE RIGHT  11/15/2018  . IR US GUIDE VASC ACCESS RIGHT  11/15/2018  . MYOMECTOMY  1994  . REFRACTIVE SURGERY Bilateral     ROS: Review of Systems Negative except as stated above  PHYSICAL EXAM: BP (!) 174/73   Pulse 67   Temp 97.7 F (36.5 C)   Resp 16   Wt 154 lb (69.9 kg)   LMP 03/13/2002   SpO2 98%   BMI 30.08 kg/m   Wt Readings from Last 3 Encounters:  05/06/19 154 lb (69.9 kg)  05/02/19 155 lb 4.8 oz (70.4 kg)  04/01/19 154 lb (69.9 kg)    Physical Exam  General appearance - alert, well appearing, and in no distress Mental status - normal mood, behavior, speech, dress, motor activity, and thought processes Neck - supple, no significant adenopathy Chest - clear to auscultation, no wheezes, rales or rhonchi, symmetric air entry Heart -regular rate and rhythm with systolic murmur along left sternal border.   Extremities -trace lower extremity edema  Results for orders placed or performed in visit on 05/06/19  POCT glucose (manual entry)  Result Value Ref Range   POC Glucose 277 (A) 70 - 99 mg/dl   Lab Results  Component Value Date   HGBA1C 7.8 (H) 01/21/2019     CMP Latest Ref Rng & Units 01/22/2019 01/21/2019 01/12/2019  Glucose 70 - 99  mg/dL 181(H) 171(H) 263(H)  BUN 6 - 20 mg/dL 47(H) 45(H) 125(H)  Creatinine 0.44 - 1.00 mg/dL 8.00(H) 5.40(H) 14.26(H)  Sodium 135 - 145 mmol/L 138 135 142  Potassium 3.5 - 5.1 mmol/L 5.8(H) 5.6(H) 5.5(H)  Chloride 98 - 111 mmol/L 96(L) 97(L) 103  CO2 22 - 32 mmol/L 26 - 20(L)  Calcium 8.9 - 10.3 mg/dL 8.5(L) - 7.3(L)  Total Protein 6.5 - 8.1 g/dL - - 5.5(L)  Total Bilirubin 0.3 - 1.2 mg/dL - - 0.7  Alkaline Phos 38 - 126 U/L - - 190(H)  AST 15 - 41 U/L - - 17  ALT 0 - 44 U/L - - 71(H)   Lipid Panel     Component Value Date/Time   CHOL 136 11/18/2018 1449   TRIG 164 (H) 11/18/2018 1449   HDL 35 (L) 11/18/2018 1449   CHOLHDL 3.9 11/18/2018 1449   CHOLHDL 3.2 12/10/2015 1145   VLDL 12 12/10/2015 1145   LDLCALC 73 11/18/2018 1449    CBC    Component Value Date/Time   WBC 7.6 01/22/2019 0540   RBC 4.28 01/22/2019 0540   HGB 11.7 (L) 01/22/2019 0540   HGB 12.0 11/02/2017 1651   HCT 37.7 01/22/2019 0540   HCT 36.6 11/02/2017 1651   PLT 260 01/22/2019 0540   PLT 298 11/02/2017 1651   MCV 88.1 01/22/2019 0540   MCV 85 11/02/2017 1651   MCH 27.3 01/22/2019 0540   MCHC 31.0 01/22/2019 0540   RDW 15.6 (H) 01/22/2019 0540   RDW 13.1 11/02/2017 1651   LYMPHSABS 3.5 10/08/2010 1612   MONOABS 0.3 10/08/2010 1612   EOSABS 0.1 10/08/2010 1612   BASOSABS 0.0 10/08/2010 1612    ASSESSMENT AND PLAN: 1. Uncontrolled  type 2 diabetes mellitus with nephropathy ([place]) Encourage patient to keep a log of her blood sugar readings so that we are able to adjust her insulin appropriately.  Encourage healthy eating habits.  I will refer her to an endocrinologist and the ophthalmologist - POCT glucose (manual entry) - Ambulatory referral to Ophthalmology - Ambulatory referral to Endocrinology - Hemoglobin A1c  2. Essential hypertension Not at goal.  Continue current dose of carvedilol.  Advised to take the Norvasc in the mornings.  3. ESRD (end stage renal disease) on dialysis ([place]) On  hemodialysis  4. Itchy skin Per nephrologist this is due to hyperphosphatemia which they are working on trying to get down  5. Encounter for screening mammogram for malignant neoplasm of breast Referral for mammogram    Patient was given the opportunity to ask questions.  Patient verbalized understanding of the plan and was able to repeat key elements of the plan.   Orders Placed This Encounter  Procedures  . POCT glucose (manual entry)     Requested Prescriptions    No prescriptions requested or ordered in this encounter    No follow-ups on file.  Karle Plumber, MD, FACP

## 2019-05-07 LAB — HEMOGLOBIN A1C
Est. average glucose Bld gHb Est-mCnc: 235 mg/dL
Hgb A1c MFr Bld: 9.8 % — ABNORMAL HIGH (ref 4.8–5.6)

## 2019-05-09 ENCOUNTER — Telehealth: Payer: Self-pay

## 2019-05-09 NOTE — Telephone Encounter (Signed)
We received a new referral for this patient and when I called to schedule her appointment, she has already seen Dr Dwyane Dee.  She states that she would rather see a female provider.  She asked if she could switch to a female.   Is this ok?

## 2019-05-09 NOTE — Telephone Encounter (Signed)
Okay to switch.  For me she has been seen only once in 1/21 with subsequent cancellations

## 2019-05-10 NOTE — Telephone Encounter (Signed)
Please decline for me for now.

## 2019-05-18 ENCOUNTER — Other Ambulatory Visit (HOSPITAL_COMMUNITY)
Admission: RE | Admit: 2019-05-18 | Discharge: 2019-05-18 | Disposition: A | Discharge status: Home / Self Care | Payer: Medicare Other | Source: Ambulatory Visit | Attending: Surgery | Admitting: Surgery

## 2019-05-18 DIAGNOSIS — Z20822 Contact with and (suspected) exposure to covid-19: Secondary | ICD-10-CM | POA: Diagnosis not present

## 2019-05-18 DIAGNOSIS — Z01812 Encounter for preprocedural laboratory examination: Secondary | ICD-10-CM | POA: Diagnosis present

## 2019-05-18 LAB — SARS CORONAVIRUS 2 (TAT 6-24 HRS): SARS Coronavirus 2: NEGATIVE

## 2019-05-19 ENCOUNTER — Encounter (HOSPITAL_COMMUNITY): Payer: Self-pay | Admitting: Surgery

## 2019-05-19 ENCOUNTER — Other Ambulatory Visit: Payer: Self-pay

## 2019-05-19 NOTE — Progress Notes (Signed)
Anesthesia Chart Review: SAME DAY WORK-UP   Case: [id number] Date/Time: 05/20/19 0944   Procedure: FISTULA SUPERFICIALIZATION LEFT PROXIMAL FISTULA (Left )   Anesthesia type: Monitor Anesthesia Care   Pre-op diagnosis: END STAGE RENAL DISEASE   [facility]   Surgeons: Brabham, Vance W, MD      DISCUSSION: Patient is a 59-year-old female scheduled for the above procedure.  She is s/p creation of left brachiocephalic aVF on 01/21/2019. Currently using right TDC for hemodialysis.   History includes never smoker, HTN, HLD, DM2 (diabetic retinopathy, nephropathy, neuropathy), glaucoma, right eye cataract, anemia, dyspnea (when volume overloaded), N/V hysterectomy/left SO (12/19/02), ESRD (HD TTS).  Remote history of itching after anesthesia.  Last evaluated by PCP Dr. Johnson on 05/06/2019.HTN and DM control not at goal. Advised glucose log so better able to adjust insulin. Referred to ophthalmology and endocrinology (had seen Dr. Kumar, but preference was for female endocrinologist). Advised continue Coreg and take Norvasc in AM.    05/18/2019 presurgical COVID-19 test negative.  She is a same-day work-up, so she will get labs and anesthesia team evaluation on the day of surgery. Of note, PAT phone RN notified surgical scheduler that patient does not have a ride/caregiver post-operatively.    VS: LMP 03/13/2002 . As of 05/06/19, WT 69.9 KG, BP 174/73, HR 67   PROVIDERS: Johnson, Deborah B, MD is PCP  Kumar, Ajay, MD is endocrinologist Deterding, James, MD is nephrologist   LABS: For day of surgery. As of 01/22/19, H/H 11.7/37.3. A1c 9.8% 05/06/19 (up from 7.8% on 01/21/19).    IMAGES: 1V PCXR 09/08/18: FINDINGS: Mild elevation of left hemidiaphragm.The cardiomediastinal contours are normal. Pulmonary vasculature is normal. No confluent airspace disease. Blunting of left costophrenic angle with possible left pleural effusion. No pneumothorax. No acute osseous  abnormalities are seen. IMPRESSION: Possible left pleural effusion.  Elevated right hemidiaphragm.   EKG: Normal sinus rhythm Prolonged QT (QT/QTc 414/483 ms) Abnormal ECG   CV: Echo 03/03/07: SUMMARY  - Overall left ventricular systolic function was vigorous. Left     ventricular ejection fraction was estimated , range being 70     % to 75 %. There was no diagnostic evidence of left     ventricular regional wall motion abnormalities. Left     ventricular wall thickness was mildly increased. Features     were consistent with mild diastolic dysfunction.  - The aortic valve was trileaflet. There was normal aortic valve     leaflet excursion. Transaortic velocity was increased due to     increased transvalvular flow. The mean transaortic valve     gradient was 8 mmHg. No evidence for aortic valve stenosis.    Past Medical History:  Diagnosis Date  . Anemia   . Cataract    right eye  . Complication of anesthesia     " I woke up itching from anesthesia many years ago."   . Depression    at times  . Diabetes mellitus 1986    poorly controlled last hemoglobin A1c  10.5 on January 01, 2010, increase since June 2011 when hemoglobin A1c was 9.3 , determined to be most likely secondary to non-compliance.  . Diabetic nephropathy ([facility])   . Diabetic retinopathy   . Dyspnea    "when I have too much fluid"  . End stage renal disease on dialysis ([facility])    T/Th/Sat [address]  . Glaucoma   . History of blood transfusion   . Hyperlipidemia   .   Hypertension   . Nausea and vomiting   . Neuropathy   . Retinopathy   . Wears glasses     Past Surgical History:  Procedure Laterality Date  . ABDOMINAL HYSTERECTOMY   2004  . AV FISTULA PLACEMENT Left 01/21/2019   Procedure: BRACHIOCEPHALIC ARTERIOVENOUS (AV) FISTULA CREATION LEFT ARM;  Surgeon: Brabham, Vance W, MD;  [facility];  Service: Vascular;  Laterality: Left;  . EYE SURGERY    . IR  FLUORO GUIDE CV LINE RIGHT  11/15/2018  . IR US GUIDE VASC ACCESS RIGHT  11/15/2018  . MYOMECTOMY  1994  . REFRACTIVE SURGERY Bilateral     MEDICATIONS: No current facility-administered medications for this encounter.   . amLODipine (NORVASC) 10 MG tablet  . atorvastatin (LIPITOR) 20 MG tablet  . calcium acetate (PHOSLO) 667 MG capsule  . carvedilol (COREG) 25 MG tablet  . cinacalcet (SENSIPAR) 60 MG tablet  . insulin aspart (NOVOLOG FLEXPEN) 100 UNIT/ML FlexPen  . Insulin Glargine (BASAGLAR KWIKPEN) 100 UNIT/ML SOPN  . methocarbamol (ROBAXIN) 500 MG tablet  . multivitamin (RENA-VIT) TABS tablet  . zolpidem (AMBIEN) 5 MG tablet  . Blood Glucose Monitoring Suppl (TRUE METRIX METER) w/Device KIT  . glucose blood (TRUE METRIX BLOOD GLUCOSE TEST) test strip  . glucose blood test strip  . Insulin Syringe-Needle U-100 (INSULIN SYRINGE 1CC/31GX5/16") 31G X 5/16" 1 ML MISC  . Insulin Syringes, Disposable, U-100 0.5 ML MISC  . TRUEPLUS LANCETS 28G MISC  . TRUEPLUS PEN NEEDLES 31G X 5 MM MISC    Allison Zelenak, PA-C Surgical Short Stay/Anesthesiology [facility] Phone [phone] [facility] Phone [phone] 05/19/2019 2:29 PM        

## 2019-05-19 NOTE — Progress Notes (Signed)
Pt denies SOB, chest pain, and being under the care of a cardiologist. Pt stated that PCP is Dr. Aundra Dubin. Pt denies having a stress test and cardiac cath. Pt made aware to stop taking  vitamins, fish oil and herbal medications. Do not take any NSAIDs ie: Ibuprofen, Advil, Naproxen (Aleve), Motrin, BC and Goody Powder. Pt made aware to take 50% ( 13 units) of Basaglar insulin at HS. Pt made aware to check CBG every 2 hours prior to arrival to hospital on DOS. Pt made aware to treat a CBG < 70 with 4 glucose tabs  wait 15 minutes after intervention to recheck CBG, if CBG remains < 70, call Short Stay unit to speak with a nurse. Pt made aware to take no NOVOLOG DOS. Pt stated that her fasting CBG " is all over everywhere." Pt reminded to quarantine.  Pt verbalized understanding of all pre-op instructions. PA, Anesthesiology, asked to review pt history.

## 2019-05-19 NOTE — Progress Notes (Signed)
Nurse lvm with Larene Beach, Surgical Coordinator, to make MD aware that pt has no ride/caregiver post-op.

## 2019-05-19 NOTE — Anesthesia Preprocedure Evaluation (Addendum)
Anesthesia Evaluation  Patient identified by MRN, date of birth, ID band Patient awake    Reviewed: Allergy & Precautions, H&P , NPO status , Patient's Chart, lab work & pertinent test results  Airway Mallampati: II   Neck ROM: full    Dental   Pulmonary shortness of breath,    breath sounds clear to auscultation       Cardiovascular hypertension,  Rhythm:regular Rate:Normal     Neuro/Psych PSYCHIATRIC DISORDERS Depression    GI/Hepatic   Endo/Other  diabetes, Insulin Dependent  Renal/GU ESRF and DialysisRenal disease     Musculoskeletal   Abdominal   Peds  Hematology   Anesthesia Other Findings   Reproductive/Obstetrics                            Anesthesia Physical Anesthesia Plan  ASA: III  Anesthesia Plan: MAC   Post-op Pain Management:    Induction: Intravenous  PONV Risk Score and Plan: 2 and Propofol infusion, Ondansetron and Treatment may vary due to age or medical condition  Airway Management Planned: Simple Face Mask  Additional Equipment:   Intra-op Plan:   Post-operative Plan:   Informed Consent: I have reviewed the patients History and Physical, chart, labs and discussed the procedure including the risks, benefits and alternatives for the proposed anesthesia with the patient or authorized representative who has indicated his/her understanding and acceptance.       Plan Discussed with: CRNA, Anesthesiologist and Surgeon  Anesthesia Plan Comments: (PAT note written 05/19/2019 by Myra Gianotti, PA-C. )       Anesthesia Quick Evaluation

## 2019-05-20 ENCOUNTER — Ambulatory Visit (HOSPITAL_COMMUNITY): Payer: Medicare Other | Admitting: Vascular Surgery

## 2019-05-20 ENCOUNTER — Encounter (HOSPITAL_COMMUNITY)
Admission: RE | Disposition: A | Discharge status: Home / Self Care | Payer: Self-pay | Source: Home / Self Care | Attending: Surgery

## 2019-05-20 ENCOUNTER — Observation Stay (HOSPITAL_COMMUNITY)
Admission: RE | Admit: 2019-05-20 | Discharge: 2019-05-21 | Disposition: A | Discharge status: Home / Self Care | Payer: Medicare Other | Attending: Surgery | Admitting: Surgery

## 2019-05-20 ENCOUNTER — Encounter (HOSPITAL_COMMUNITY): Payer: Self-pay | Admitting: Surgery

## 2019-05-20 DIAGNOSIS — E1136 Type 2 diabetes mellitus with diabetic cataract: Secondary | ICD-10-CM | POA: Diagnosis not present

## 2019-05-20 DIAGNOSIS — Z992 Dependence on renal dialysis: Secondary | ICD-10-CM

## 2019-05-20 DIAGNOSIS — D649 Anemia, unspecified: Secondary | ICD-10-CM | POA: Diagnosis not present

## 2019-05-20 DIAGNOSIS — Z79899 Other long term (current) drug therapy: Secondary | ICD-10-CM | POA: Insufficient documentation

## 2019-05-20 DIAGNOSIS — E1122 Type 2 diabetes mellitus with diabetic chronic kidney disease: Secondary | ICD-10-CM | POA: Diagnosis not present

## 2019-05-20 DIAGNOSIS — E11319 Type 2 diabetes mellitus with unspecified diabetic retinopathy without macular edema: Secondary | ICD-10-CM | POA: Diagnosis not present

## 2019-05-20 DIAGNOSIS — T82898A Other specified complication of vascular prosthetic devices, implants and grafts, initial encounter: Secondary | ICD-10-CM | POA: Diagnosis not present

## 2019-05-20 DIAGNOSIS — T82510A Breakdown (mechanical) of surgically created arteriovenous fistula, initial encounter: Principal | ICD-10-CM | POA: Insufficient documentation

## 2019-05-20 DIAGNOSIS — N186 End stage renal disease: Secondary | ICD-10-CM | POA: Diagnosis not present

## 2019-05-20 DIAGNOSIS — F329 Major depressive disorder, single episode, unspecified: Secondary | ICD-10-CM | POA: Insufficient documentation

## 2019-05-20 DIAGNOSIS — Z794 Long term (current) use of insulin: Secondary | ICD-10-CM | POA: Diagnosis not present

## 2019-05-20 DIAGNOSIS — E1140 Type 2 diabetes mellitus with diabetic neuropathy, unspecified: Secondary | ICD-10-CM | POA: Insufficient documentation

## 2019-05-20 DIAGNOSIS — Y841 Kidney dialysis as the cause of abnormal reaction of the patient, or of later complication, without mention of misadventure at the time of the procedure: Secondary | ICD-10-CM | POA: Insufficient documentation

## 2019-05-20 DIAGNOSIS — E785 Hyperlipidemia, unspecified: Secondary | ICD-10-CM | POA: Diagnosis not present

## 2019-05-20 DIAGNOSIS — I120 Hypertensive chronic kidney disease with stage 5 chronic kidney disease or end stage renal disease: Secondary | ICD-10-CM | POA: Insufficient documentation

## 2019-05-20 DIAGNOSIS — Z888 Allergy status to other drugs, medicaments and biological substances status: Secondary | ICD-10-CM | POA: Diagnosis not present

## 2019-05-20 HISTORY — PX: FISTULA SUPERFICIALIZATION: SHX6341

## 2019-05-20 HISTORY — DX: Presence of spectacles and contact lenses: Z97.3

## 2019-05-20 HISTORY — DX: Other complications of anesthesia, initial encounter: T88.59XA

## 2019-05-20 LAB — GLUCOSE, CAPILLARY
Glucose-Capillary: 83 mg/dL (ref 70–99)
Glucose-Capillary: 89 mg/dL (ref 70–99)
Glucose-Capillary: 104 mg/dL — ABNORMAL HIGH (ref 70–99)
Glucose-Capillary: 187 mg/dL — ABNORMAL HIGH (ref 70–99)
Glucose-Capillary: 359 mg/dL — ABNORMAL HIGH (ref 70–99)

## 2019-05-20 LAB — CBC
HCT: 31.8 % — ABNORMAL LOW (ref 36.0–46.0)
Hemoglobin: 9.9 g/dL — ABNORMAL LOW (ref 12.0–15.0)
MCH: 28.7 pg (ref 26.0–34.0)
MCHC: 31.1 g/dL (ref 30.0–36.0)
MCV: 92.2 fL (ref 80.0–100.0)
Platelets: 221 10*3/uL (ref 150–400)
RBC: 3.45 MIL/uL — ABNORMAL LOW (ref 3.87–5.11)
RDW: 19.4 % — ABNORMAL HIGH (ref 11.5–15.5)
WBC: 6.7 10*3/uL (ref 4.0–10.5)
nRBC: 0 % (ref 0.0–0.2)

## 2019-05-20 LAB — POCT I-STAT, CHEM 8
BUN: 38 mg/dL — ABNORMAL HIGH (ref 6–20)
Calcium, Ion: 1.03 mmol/L — ABNORMAL LOW (ref 1.15–1.40)
Chloride: 98 mmol/L (ref 98–111)
Creatinine, Ser: 8.3 mg/dL — ABNORMAL HIGH (ref 0.44–1.00)
Glucose, Bld: 78 mg/dL (ref 70–99)
HCT: 33 % — ABNORMAL LOW (ref 36.0–46.0)
Hemoglobin: 11.2 g/dL — ABNORMAL LOW (ref 12.0–15.0)
Potassium: 4.2 mmol/L (ref 3.5–5.1)
Sodium: 140 mmol/L (ref 135–145)
TCO2: 33 mmol/L — ABNORMAL HIGH (ref 22–32)

## 2019-05-20 LAB — CREATININE, SERUM
Creatinine, Ser: 8.75 mg/dL — ABNORMAL HIGH (ref 0.44–1.00)
GFR calc Af Amer: 5 mL/min — ABNORMAL LOW (ref >60)
GFR calc non Af Amer: 5 mL/min — ABNORMAL LOW (ref >60)

## 2019-05-20 SURGERY — FISTULA SUPERFICIALIZATION
Anesthesia: Monitor Anesthesia Care | Site: Arm Upper | Laterality: Left

## 2019-05-20 MED ORDER — INSULIN ASPART 100 UNIT/ML ~~LOC~~ SOLN
0.0000 [IU] | Freq: Every day | SUBCUTANEOUS | Status: DC
  Administered 2019-05-20: 5 [IU] via SUBCUTANEOUS

## 2019-05-20 MED ORDER — SODIUM CHLORIDE 0.9 % IV SOLN: INTRAVENOUS | Status: DC

## 2019-05-20 MED ORDER — ONDANSETRON HCL 4 MG/2ML IJ SOLN: 4.0000 mg | Freq: Four times a day (QID) | INTRAMUSCULAR | Status: DC | PRN

## 2019-05-20 MED ORDER — CARVEDILOL 25 MG PO TABS
37.5000 mg | ORAL_TABLET | Freq: Two times a day (BID) | ORAL | Status: DC
  Administered 2019-05-21: 37.5 mg via ORAL
  Filled 2019-05-20: qty 1

## 2019-05-20 MED ORDER — AMLODIPINE BESYLATE 5 MG PO TABS: 10.0000 mg | ORAL_TABLET | Freq: Every day | ORAL | Status: DC

## 2019-05-20 MED ORDER — FENTANYL CITRATE (PF) 100 MCG/2ML IJ SOLN: 25.0000 ug | INTRAMUSCULAR | Status: DC | PRN

## 2019-05-20 MED ORDER — PROPOFOL 10 MG/ML IV BOLUS
INTRAVENOUS | Status: DC | PRN
  Administered 2019-05-20 (×2): 15 mg via INTRAVENOUS
  Administered 2019-05-20: 10 mg via INTRAVENOUS

## 2019-05-20 MED ORDER — ACETAMINOPHEN 325 MG PO TABS
ORAL_TABLET | ORAL | Status: AC
  Filled 2019-05-20: qty 2

## 2019-05-20 MED ORDER — CEFAZOLIN SODIUM-DEXTROSE 2-4 GM/100ML-% IV SOLN
2.0000 g | INTRAVENOUS | Status: AC
  Administered 2019-05-20: 2 g via INTRAVENOUS

## 2019-05-20 MED ORDER — HEPARIN SODIUM (PORCINE) 5000 UNIT/ML IJ SOLN
5000.0000 [IU] | Freq: Three times a day (TID) | INTRAMUSCULAR | Status: DC
  Administered 2019-05-20 – 2019-05-21 (×2): 5000 [IU] via SUBCUTANEOUS
  Filled 2019-05-20 (×2): qty 1

## 2019-05-20 MED ORDER — CALCIUM ACETATE (PHOS BINDER) 667 MG PO CAPS
667.0000 mg | ORAL_CAPSULE | Freq: Two times a day (BID) | ORAL | Status: DC | PRN
  Filled 2019-05-20: qty 1

## 2019-05-20 MED ORDER — SODIUM CHLORIDE 0.9 % IV SOLN
INTRAVENOUS | Status: DC | PRN
  Administered 2019-05-20: 500 mL

## 2019-05-20 MED ORDER — LIDOCAINE-EPINEPHRINE (PF) 1 %-1:200000 IJ SOLN
INTRAMUSCULAR | Status: AC
  Filled 2019-05-20: qty 30

## 2019-05-20 MED ORDER — PHENOL 1.4 % MT LIQD: 1.0000 | OROMUCOSAL | Status: DC | PRN

## 2019-05-20 MED ORDER — SENNA 8.6 MG PO TABS
1.0000 | ORAL_TABLET | Freq: Two times a day (BID) | ORAL | Status: DC
  Administered 2019-05-20: 8.6 mg via ORAL
  Filled 2019-05-20: qty 1

## 2019-05-20 MED ORDER — OXYCODONE-ACETAMINOPHEN 5-325 MG PO TABS: 1.0000 | ORAL_TABLET | ORAL | Status: DC | PRN

## 2019-05-20 MED ORDER — ZOLPIDEM TARTRATE 5 MG PO TABS: 5.0000 mg | ORAL_TABLET | Freq: Every evening | ORAL | Status: DC | PRN

## 2019-05-20 MED ORDER — OXYCODONE HCL 5 MG PO TABS: 5.0000 mg | ORAL_TABLET | Freq: Once | ORAL | Status: DC | PRN

## 2019-05-20 MED ORDER — PANTOPRAZOLE SODIUM 40 MG PO TBEC
40.0000 mg | DELAYED_RELEASE_TABLET | Freq: Every day | ORAL | Status: DC
  Administered 2019-05-20: 40 mg via ORAL
  Filled 2019-05-20: qty 1

## 2019-05-20 MED ORDER — OXYCODONE HCL 5 MG/5ML PO SOLN: 5.0000 mg | Freq: Once | ORAL | Status: DC | PRN

## 2019-05-20 MED ORDER — LIDOCAINE-EPINEPHRINE 1 %-1:100000 IJ SOLN
INTRAMUSCULAR | Status: AC
  Filled 2019-05-20: qty 1

## 2019-05-20 MED ORDER — 0.9 % SODIUM CHLORIDE (POUR BTL) OPTIME
TOPICAL | Status: DC | PRN
  Administered 2019-05-20: 1000 mL

## 2019-05-20 MED ORDER — INSULIN ASPART 100 UNIT/ML ~~LOC~~ SOLN: 0.0000 [IU] | Freq: Three times a day (TID) | SUBCUTANEOUS | Status: DC

## 2019-05-20 MED ORDER — ATORVASTATIN CALCIUM 10 MG PO TABS
20.0000 mg | ORAL_TABLET | Freq: Every day | ORAL | Status: DC
  Administered 2019-05-20: 20 mg via ORAL
  Filled 2019-05-20: qty 2

## 2019-05-20 MED ORDER — PROPOFOL 10 MG/ML IV BOLUS
INTRAVENOUS | Status: AC
  Filled 2019-05-20: qty 20

## 2019-05-20 MED ORDER — POLYETHYLENE GLYCOL 3350 17 G PO PACK: 17.0000 g | PACK | Freq: Every day | ORAL | Status: DC | PRN

## 2019-05-20 MED ORDER — PROPOFOL 500 MG/50ML IV EMUL
INTRAVENOUS | Status: DC | PRN
  Administered 2019-05-20: 50 ug/kg/min via INTRAVENOUS

## 2019-05-20 MED ORDER — RENA-VITE PO TABS
1.0000 | ORAL_TABLET | Freq: Every day | ORAL | Status: DC
  Administered 2019-05-20: 1 via ORAL
  Filled 2019-05-20 (×2): qty 1

## 2019-05-20 MED ORDER — CINACALCET HCL 30 MG PO TABS
60.0000 mg | ORAL_TABLET | Freq: Every day | ORAL | Status: DC
  Filled 2019-05-20: qty 2

## 2019-05-20 MED ORDER — ONDANSETRON HCL 4 MG/2ML IJ SOLN
INTRAMUSCULAR | Status: AC
  Filled 2019-05-20: qty 4

## 2019-05-20 MED ORDER — ACETAMINOPHEN 325 MG PO TABS
325.0000 mg | ORAL_TABLET | ORAL | Status: DC | PRN
  Administered 2019-05-20 – 2019-05-21 (×4): 650 mg via ORAL
  Filled 2019-05-20 (×3): qty 2

## 2019-05-20 MED ORDER — FENTANYL CITRATE (PF) 250 MCG/5ML IJ SOLN
INTRAMUSCULAR | Status: DC | PRN
  Administered 2019-05-20 (×3): 25 ug via INTRAVENOUS

## 2019-05-20 MED ORDER — METOPROLOL TARTRATE 5 MG/5ML IV SOLN
2.0000 mg | INTRAVENOUS | Status: DC | PRN
  Filled 2019-05-20: qty 5

## 2019-05-20 MED ORDER — METHOCARBAMOL 500 MG PO TABS: 500.0000 mg | ORAL_TABLET | Freq: Two times a day (BID) | ORAL | Status: DC | PRN

## 2019-05-20 MED ORDER — DIPHENHYDRAMINE HCL 50 MG/ML IJ SOLN
12.5000 mg | Freq: Once | INTRAMUSCULAR | Status: AC
  Administered 2019-05-20: 12.5 mg via INTRAVENOUS

## 2019-05-20 MED ORDER — ROCURONIUM BROMIDE 10 MG/ML (PF) SYRINGE
PREFILLED_SYRINGE | INTRAVENOUS | Status: AC
  Filled 2019-05-20: qty 20

## 2019-05-20 MED ORDER — LABETALOL HCL 5 MG/ML IV SOLN
10.0000 mg | INTRAVENOUS | Status: DC | PRN
  Administered 2019-05-20: 10 mg via INTRAVENOUS
  Filled 2019-05-20: qty 4

## 2019-05-20 MED ORDER — FENTANYL CITRATE (PF) 250 MCG/5ML IJ SOLN
INTRAMUSCULAR | Status: AC
  Filled 2019-05-20: qty 5

## 2019-05-20 MED ORDER — HYDROXYZINE HCL 25 MG PO TABS
12.5000 mg | ORAL_TABLET | Freq: Once | ORAL | Status: AC
  Administered 2019-05-20: 12.5 mg via ORAL
  Filled 2019-05-20 (×2): qty 1

## 2019-05-20 MED ORDER — MIDAZOLAM HCL 2 MG/2ML IJ SOLN
INTRAMUSCULAR | Status: DC | PRN
  Administered 2019-05-20: 2 mg via INTRAVENOUS

## 2019-05-20 MED ORDER — ACETAMINOPHEN 325 MG RE SUPP
325.0000 mg | RECTAL | Status: DC | PRN
  Filled 2019-05-20: qty 2

## 2019-05-20 MED ORDER — DIPHENHYDRAMINE HCL 25 MG PO CAPS
25.0000 mg | ORAL_CAPSULE | Freq: Two times a day (BID) | ORAL | Status: DC | PRN
  Filled 2019-05-20: qty 1

## 2019-05-20 MED ORDER — DEXAMETHASONE SODIUM PHOSPHATE 10 MG/ML IJ SOLN
INTRAMUSCULAR | Status: AC
  Filled 2019-05-20: qty 1

## 2019-05-20 MED ORDER — ONDANSETRON HCL 4 MG/2ML IJ SOLN
INTRAMUSCULAR | Status: DC | PRN
  Administered 2019-05-20: 4 mg via INTRAVENOUS

## 2019-05-20 MED ORDER — MIDAZOLAM HCL 2 MG/2ML IJ SOLN
INTRAMUSCULAR | Status: AC
  Filled 2019-05-20: qty 2

## 2019-05-20 MED ORDER — GUAIFENESIN-DM 100-10 MG/5ML PO SYRP
15.0000 mL | ORAL_SOLUTION | ORAL | Status: DC | PRN
  Filled 2019-05-20: qty 15

## 2019-05-20 MED ORDER — CALCIUM ACETATE (PHOS BINDER) 667 MG PO CAPS
2001.0000 mg | ORAL_CAPSULE | Freq: Three times a day (TID) | ORAL | Status: DC
  Administered 2019-05-21: 2001 mg via ORAL
  Filled 2019-05-20 (×3): qty 3

## 2019-05-20 MED ORDER — INSULIN GLARGINE 100 UNIT/ML ~~LOC~~ SOLN
26.0000 [IU] | Freq: Every day | SUBCUTANEOUS | Status: DC
  Administered 2019-05-20: 26 [IU] via SUBCUTANEOUS
  Filled 2019-05-20 (×2): qty 0.26

## 2019-05-20 MED ORDER — DIPHENHYDRAMINE HCL 50 MG/ML IJ SOLN
INTRAMUSCULAR | Status: AC
  Filled 2019-05-20: qty 1

## 2019-05-20 MED ORDER — LIDOCAINE 2% (20 MG/ML) 5 ML SYRINGE
INTRAMUSCULAR | Status: AC
  Filled 2019-05-20: qty 10

## 2019-05-20 MED ORDER — CEFAZOLIN SODIUM-DEXTROSE 2-4 GM/100ML-% IV SOLN
INTRAVENOUS | Status: AC
  Filled 2019-05-20: qty 100

## 2019-05-20 MED ORDER — HYDRALAZINE HCL 20 MG/ML IJ SOLN: 5.0000 mg | INTRAMUSCULAR | Status: DC | PRN

## 2019-05-20 SURGICAL SUPPLY — 31 items
ADH SKN CLS APL DERMABOND .7 (GAUZE/BANDAGES/DRESSINGS) ×1
ARMBAND PINK RESTRICT EXTREMIT (MISCELLANEOUS) ×3 IMPLANT
CANISTER SUCT 3000ML PPV (MISCELLANEOUS) ×3 IMPLANT
CLIP VESOCCLUDE MED 6/CT (CLIP) ×3 IMPLANT
CLIP VESOCCLUDE SM WIDE 6/CT (CLIP) ×3 IMPLANT
COVER PROBE W GEL 5X96 (DRAPES) ×3 IMPLANT
DERMABOND ADVANCED (GAUZE/BANDAGES/DRESSINGS) ×2
DERMABOND ADVANCED .7 DNX12 (GAUZE/BANDAGES/DRESSINGS) ×1 IMPLANT
DRSG COVADERM 4X8 (GAUZE/BANDAGES/DRESSINGS) ×2 IMPLANT
ELECT REM PT RETURN 9FT ADLT (ELECTROSURGICAL) ×3
ELECTRODE REM PT RTRN 9FT ADLT (ELECTROSURGICAL) ×1 IMPLANT
GLOVE BIOGEL PI IND STRL 7.5 (GLOVE) ×1 IMPLANT
GLOVE BIOGEL PI INDICATOR 7.5 (GLOVE) ×2
GLOVE SURG SS PI 7.5 STRL IVOR (GLOVE) ×3 IMPLANT
GOWN STRL REUS W/ TWL LRG LVL3 (GOWN DISPOSABLE) ×2 IMPLANT
GOWN STRL REUS W/ TWL XL LVL3 (GOWN DISPOSABLE) ×1 IMPLANT
GOWN STRL REUS W/TWL LRG LVL3 (GOWN DISPOSABLE) ×6
GOWN STRL REUS W/TWL XL LVL3 (GOWN DISPOSABLE) ×3
HEMOSTAT SNOW SURGICEL 2X4 (HEMOSTASIS) IMPLANT
KIT BASIN OR (CUSTOM PROCEDURE TRAY) ×3 IMPLANT
KIT TURNOVER KIT B (KITS) ×3 IMPLANT
NS IRRIG 1000ML POUR BTL (IV SOLUTION) ×3 IMPLANT
PACK CV ACCESS (CUSTOM PROCEDURE TRAY) ×3 IMPLANT
PAD ARMBOARD 7.5X6 YLW CONV (MISCELLANEOUS) ×6 IMPLANT
SUT PROLENE 6 0 CC (SUTURE) ×3 IMPLANT
SUT VIC AB 3-0 SH 27 (SUTURE) ×6
SUT VIC AB 3-0 SH 27X BRD (SUTURE) ×1 IMPLANT
SUT VICRYL 4-0 PS2 18IN ABS (SUTURE) ×4 IMPLANT
TOWEL GREEN STERILE (TOWEL DISPOSABLE) ×3 IMPLANT
UNDERPAD 30X36 HEAVY ABSORB (UNDERPADS AND DIAPERS) ×3 IMPLANT
WATER STERILE IRR 1000ML POUR (IV SOLUTION) ×3 IMPLANT

## 2019-05-20 NOTE — Op Note (Signed)
    Patient name: Ann Bryan MRN: [id number] DOB: October 15, 1960 Sex: female  05/20/2019 Pre-operative Diagnosis: Nonmaturing left brachiocephalic fistula Post-operative diagnosis:  Same Surgeon:  [name] Assistants: Risa Grill Procedure:   Revision of left brachiocephalic fistula via elevation and Branch ligation Anesthesia: MAC Blood Loss: Minimal Specimens: None  Findings: 2 moderate-sized (3 mm branches were ligated.  The vein was approximately 1 cm below the skin.  It was fully mobilized and elevated.  Indications: The patient is status post left brachiocephalic fistula creation.  They have a trouble with access.  Ultrasound suggest it is on the deep side.  She comes in today for elevation.  Procedure:  The patient was identified in the holding area and taken to [place] [facility]  The patient was then placed supine on the table. MAC anesthesia was administered.  The patient was prepped and draped in the usual sterile fashion.  A time out was called and antibiotics were administered.  Ultrasound was used to map the course of the cephalic vein in the upper arm.  The did appear to be of adequate caliber but deep.  2 incisions were made over top of the cephalic vein.  Using cautery and sharp dissection, I mobilized the cephalic vein from the antecubital crease up to the axilla.  There were 2 3 mm branches that were ligated between silk ties.  The vein measured approximately 5-6 mm throughout.  Once it was fully mobilized, I reapproximated the subcutaneous tissue posterior to the fistula with interrupted 3-0 Vicryl.  Both skin incisions were then closed directly over top of the fistula with 4-0 Vicryl.  Dermabond and a sterile dressing were applied.   Disposition: To PACU stable.   Theotis Burrow, M.D., [facility] Office: [phone] Pager:  [phone]

## 2019-05-20 NOTE — Anesthesia Postprocedure Evaluation (Signed)
Anesthesia Post Note  Patient: Ann Bryan  Procedure(s) Performed: FISTULA SUPERFICIALIZATION LEFT PROXIMAL FISTULA (Left Arm Upper)     Patient location during evaluation: PACU Anesthesia Type: MAC Level of consciousness: awake and alert Pain management: pain level controlled Vital Signs Assessment: post-procedure vital signs reviewed and stable Respiratory status: spontaneous breathing, nonlabored ventilation, respiratory function stable and patient connected to nasal cannula oxygen Cardiovascular status: stable and blood pressure returned to baseline Postop Assessment: no apparent nausea or vomiting Anesthetic complications: no    Last Vitals:  Vitals:   05/20/19 1300 05/20/19 1400  BP: (!) 163/76   Pulse: 66 65  Resp: 17 20  Temp:    SpO2: 95% 91%    Last Pain:  Vitals:   05/20/19 1500  TempSrc:   PainSc: 0-No pain                 Necia Kamm S

## 2019-05-20 NOTE — Transfer of Care (Signed)
Immediate Anesthesia Transfer of Care Note  Patient: Ann Bryan  Procedure(s) Performed: FISTULA SUPERFICIALIZATION LEFT PROXIMAL FISTULA (Left Arm Upper)  Patient Location: PACU  Anesthesia Type:General  Level of Consciousness: drowsy and patient cooperative  Airway & Oxygen Therapy: Patient Spontanous Breathing  Post-op Assessment: Report given to RN and Post -op Vital signs reviewed and stable  Post vital signs: Reviewed and stable  Last Vitals:  Vitals Value Taken Time  BP 150/70 05/20/19 1120  Temp    Pulse 67 05/20/19 1121  Resp 22 05/20/19 1121  SpO2 88 % 05/20/19 1121  Vitals shown include unvalidated device data.  Last Pain:  Vitals:   05/20/19 0919  TempSrc:   PainSc: 0-No pain      Patients Stated Pain Goal: 3 (83/07/46 0029)  Complications: No apparent anesthesia complications

## 2019-05-20 NOTE — Progress Notes (Signed)
Dr. Marcie Bal with anesthesia made aware that the pt drank 2- 3oz of pineapple/orange juice with 2 glucose tablets for a low blood sugar at 0600. Anesthesiologist also made aware of trending blood sugars.

## 2019-05-20 NOTE — Interval H&P Note (Signed)
History and Physical Interval Note:  05/20/2019 9:30 AM  Ann Bryan  has presented today for surgery, with the diagnosis of END STAGE RENAL DISEASE.  The various methods of treatment have been discussed with the patient and family. After consideration of risks, benefits and other options for treatment, the patient has consented to  Procedure(s): FISTULA SUPERFICIALIZATION LEFT PROXIMAL FISTULA (Left) as a surgical intervention.  The patient's history has been reviewed, patient examined, no change in status, stable for surgery.  I have reviewed the patient's chart and labs.  Questions were answered to the patient's satisfaction.     [name]

## 2019-05-20 NOTE — Interval H&P Note (Signed)
History and Physical Interval Note:  05/20/2019 9:42 AM  Ann Bryan  has presented today for surgery, with the diagnosis of END STAGE RENAL DISEASE.  The various methods of treatment have been discussed with the patient and family. After consideration of risks, benefits and other options for treatment, the patient has consented to  Procedure(s): FISTULA SUPERFICIALIZATION LEFT PROXIMAL FISTULA (Left) as a surgical intervention.  The patient's history has been reviewed, patient examined, no change in status, stable for surgery.  I have reviewed the patient's chart and labs.  Questions were answered to the patient's satisfaction.     [name]

## 2019-05-20 NOTE — Anesthesia Procedure Notes (Signed)
Procedure Name: MAC Date/Time: 05/20/2019 10:10 AM Performed by: Janace Litten, CRNA Pre-anesthesia Checklist: Patient identified, Emergency Drugs available, Suction available and Patient being monitored Patient Re-evaluated:Patient Re-evaluated prior to induction Oxygen Delivery Method: Simple face mask

## 2019-05-20 NOTE — Discharge Instructions (Signed)
Keep left arm incision dry

## 2019-05-21 DIAGNOSIS — T82510A Breakdown (mechanical) of surgically created arteriovenous fistula, initial encounter: Secondary | ICD-10-CM | POA: Diagnosis not present

## 2019-05-21 LAB — GLUCOSE, CAPILLARY: Glucose-Capillary: 120 mg/dL — ABNORMAL HIGH (ref 70–99)

## 2019-05-21 NOTE — Progress Notes (Signed)
[facility]  Subjective  - Doing well tolerating pain control with Tylenol.   Objective (!) 162/71 67 98.5 F (36.9 C) (Oral) 20 95%  Intake/Output Summary (Last 24 hours) at 05/21/2019 0708 Last data filed at 05/20/2019 1106 Gross per 24 hour  Intake 400 ml  Output 15 ml  Net 385 ml    Left UE dressing clean and dry Palpable radial pulse and thrill in fistula Sensation and motor intact left hand.  Assessment/Planning: POD # 1Revision of left brachiocephalic fistula via elevation and Branch ligation  F/U in office in 2-3 weeks. Discharge in stable condition  Roxy Horseman 05/21/2019 7:08 AM --  Laboratory Lab Results: Recent Labs    05/20/19 0909 05/20/19 1816  WBC  --  6.7  HGB 11.2* 9.9*  HCT 33.0* 31.8*  PLT  --  221   BMET Recent Labs    05/20/19 0909 05/20/19 1816  NA 140  --   K 4.2  --   CL 98  --   GLUCOSE 78  --   BUN 38*  --   CREATININE 8.30* 8.75*    COAG Lab Results  Component Value Date   INR 1.1 11/15/2018   No results found for: PTT

## 2019-05-21 NOTE — Progress Notes (Signed)
Pt was wheeled down for discharge to home; in no acute distress, nor complaints of pain nor discomfort; all her belongings was carried with her. Discharge instructions given by RN; pt verbalized understanding on instructions given.

## 2019-05-23 ENCOUNTER — Telehealth: Payer: Self-pay

## 2019-05-23 NOTE — Discharge Summary (Signed)
[facility] Discharge Summary   Patient ID:  Ann Bryan MRN: [id number] DOB/AGE: 1960-05-20 59 y.o.  Admit date: 05/20/2019 Discharge date: 05/21/19 Date of Surgery: 05/20/2019 Surgeon: Surgeon(s): Serafina Mitchell, MD  Admission Diagnosis: ESRD on dialysis [facility]) [N18.6, Z99.2]  Discharge Diagnoses:  ESRD on dialysis ([name]) [N18.6, Z99.2]  Secondary Diagnoses: Past Medical History:  Diagnosis Date  . Anemia   . Cataract    right eye  . Complication of anesthesia     " I woke up itching from anesthesia many years ago."   . Depression    at times  . Diabetes mellitus 1986    poorly controlled last hemoglobin A1c  10.5 on January 01, 2010, increase since June 2011 when hemoglobin A1c was 9.3 , determined to be most likely secondary to non-compliance.  . Diabetic nephropathy ([place])   . Diabetic retinopathy   . Dyspnea    "when I have too much fluid"  . End stage renal disease on dialysis [facility])    T/Th/Sat [address]  . Glaucoma   . History of blood transfusion   . Hyperlipidemia   . Hypertension   . Nausea and vomiting   . Neuropathy   . Retinopathy   . Wears glasses     Procedure(s): FISTULA SUPERFICIALIZATION LEFT PROXIMAL FISTULA  Discharged Condition: good  HPI: This is a 59 y.o. female who is s/p left radiocephalic AV fistula creation 01/21/2019.  She has had HD 2 x using the left fistula that was not successful.  She has cramping in her hands that gets worse some days preventing her from staying still and tolerating HD.  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 The fistula has matured well enough, but is deeper than 0.6 in the proximal 1.  She will be scheduled for superficialization of the fistula   Hospital Course:  Ann Bryan is a 59 y.o. female is S/P  Procedure(s): FISTULA SUPERFICIALIZATION LEFT PROXIMAL FISTULA Left UE dressing clean and dry Palpable radial pulse and thrill in fistula Sensation and motor  intact left hand.  F/U in office in 2-3 weeks. Discharge in stable condition  Significant Diagnostic Studies: CBC Lab Results  Component Value Date   WBC 6.7 05/20/2019   HGB 9.9 (L) 05/20/2019   HCT 31.8 (L) 05/20/2019   MCV 92.2 05/20/2019   PLT 221 05/20/2019    BMET    Component Value Date/Time   NA 140 05/20/2019 0909   NA 143 11/02/2017 1651   K 4.2 05/20/2019 0909   CL 98 05/20/2019 0909   CO2 26 01/22/2019 0540   GLUCOSE 78 05/20/2019 0909   BUN 38 (H) 05/20/2019 0909   BUN 64 (H) 11/02/2017 1651   CREATININE 8.75 (H) 05/20/2019 1816   CREATININE 1.69 (H) 12/10/2015 1145   CALCIUM 8.5 (L) 01/22/2019 0540   GFRNONAA 5 (L) 05/20/2019 1816   GFRNONAA 34 (L) 12/10/2015 1145   GFRAA 5 (L) 05/20/2019 1816   GFRAA 39 (L) 12/10/2015 1145   COAG Lab Results  Component Value Date   INR 1.1 11/15/2018     Disposition:  Discharge to :Home Discharge Instructions    Call MD for:  redness, tenderness, or signs of infection (pain, swelling, bleeding, redness, odor or green/yellow discharge around incision site)   Complete by: As directed    Call MD for:  severe or increased pain, loss or decreased feeling  in affected limb(s)   Complete by: As directed  Call MD for:  temperature >100.5   Complete by: As directed    Resume previous diet   Complete by: As directed      Allergies as of 05/21/2019      Reactions   Biopatch Protective Disk-chg [chlorhexidine]    States rash with use of CHG bath   Doxycycline Nausea And Vomiting   Victoza [liraglutide] Nausea And Vomiting      Medication List    TAKE these medications   amLODipine 10 MG tablet Commonly known as: NORVASC TAKE ONE TABLET BY MOUTH EVERY DAY   atorvastatin 20 MG tablet Commonly known as: LIPITOR TAKE 1 TABLET (20 MG TOTAL) BY MOUTH DAILY.   Basaglar KwikPen 100 UNIT/ML Inject 0.26 mLs (26 Units total) into the skin at bedtime. Taking 26 units at hs   calcium acetate 667 MG capsule Commonly  known as: PHOSLO Take 667-2,001 mg by mouth See admin instructions. 2,001 mg with meals 3 times daily and 667 mg 2 times daily with snacks   carvedilol 25 MG tablet Commonly known as: COREG Take 1.5 tablets (37.5 mg total) by mouth 2 (two) times daily with a meal. What changed: additional instructions   cinacalcet 60 MG tablet Commonly known as: SENSIPAR Take 60 mg by mouth daily.   glucose blood test strip Use as instructed   True Metrix Blood Glucose Test test strip Generic drug: glucose blood Use as instructed   INSULIN SYRINGE 1CC/31GX5/16" 31G X 5/16" 1 ML Misc Use as directed.  BD 61m x 31G needles   Insulin Syringes (Disposable) U-100 0.5 ML Misc 1 each by Does not apply route 2 (two) times daily.   methocarbamol 500 MG tablet Commonly known as: ROBAXIN Take 500 mg by mouth 2 (two) times daily as needed for muscle spasms.   multivitamin Tabs tablet Take 1 tablet by mouth daily.   NovoLOG FlexPen 100 UNIT/ML FlexPen Generic drug: insulin aspart INJECT 10-14 UNITS INTO THE SKIN THREE TIMES DAILY WITH MEALS.   True Metrix Meter w/Device Kit 1 each by Does not apply route as needed.   TRUEplus Lancets 28G Misc 1 each by Does not apply route 3 (three) times daily.   TRUEplus Pen Needles 31G X 5 MM Misc Generic drug: Insulin Pen Needle USE AS DIRECTED   zolpidem 5 MG tablet Commonly known as: AMBIEN Take 1 tablet (5 mg total) by mouth at bedtime as needed. Each prescription to last a month. What changed: reasons to take this      Verbal and written Discharge instructions given to the patient. Wound care per Discharge AVS Follow-up Information    BSerafina Mitchell MD Follow up.   Specialties: Vascular Surgery, Cardiology Why: Office will call you with follow-up appointment (sent) Contact information: [address] [phone]          Signed: ERoxy Horseman5/17/2021, 8:48 AM

## 2019-05-23 NOTE — Telephone Encounter (Signed)
Transition Care Management Follow-up Telephone Call Date of discharge and from where: 05/21/2019 from [facility]    How have you been since you were released from the hospital? Feeling fine   Any questions or concerns? YES  Items Reviewed:  Did the pt receive and understand the discharge instructions provided? YES  Medications obtained and verified? YES   Any new allergies since your discharge? Yes   Dietary orders reviewed? Yes   Do you have support at home? None   Functional Questionnaire: (I = Independent and D = Dependent) ADLs: I   Follow up appointments reviewed:  PCP Hospital f/u appt confirmed?  Scheduled to see Dr Wynetta Emery on  05/10/2019 at [facility] f/u appt confirmed? 0524/2021 at 8:15 Are transportation arrangements needed? NO If their condition worsens, /is the pt aware to call PCP or go to the Emergency Dept.? Pt is aware if condition is worsening or start experiencing any of diff breathing, fevers >100.5. bleeding , SOB, chest pain, rapid weight gain, severe uncontrolled pain, or visual disturbances to return to ED Was the patient provided with contact information for the PCP's office or ED? YES  Was to pt encouraged to call back with questions or concerns?YES name and contact information given.

## 2019-05-25 NOTE — Telephone Encounter (Signed)
LM for patient to CB to inform her that Dr Cruzita Lederer has declined to accept her and that she would need to stay with Dr Dwyane Dee if she still wants to come here.

## 2019-05-25 NOTE — Telephone Encounter (Signed)
I called patient and scheduled her with you for 06-01-19 at 1050.

## 2019-05-25 NOTE — Telephone Encounter (Signed)
Patient is ok with staying with Dr Dwyane Dee.  I scheduled her an appointment for 06-08-19 with lab appt 06-01-19.   Please order labs.   Thank you

## 2019-05-25 NOTE — Telephone Encounter (Signed)
I LM for patient to call back to schedule with Dr Kelton Pillar instead of Dr Dwyane Dee.   It has been approved for patient to switch provider to a Female.

## 2019-05-25 NOTE — Telephone Encounter (Signed)
I would like to check with Dr. Maretta Bees if she wants to see her since she wants a female doctor

## 2019-05-27 ENCOUNTER — Telehealth (HOSPITAL_COMMUNITY): Payer: Self-pay

## 2019-05-27 NOTE — Telephone Encounter (Signed)

## 2019-05-30 ENCOUNTER — Ambulatory Visit (INDEPENDENT_AMBULATORY_CARE_PROVIDER_SITE_OTHER): Payer: Self-pay | Admitting: Physician Assistant

## 2019-05-30 ENCOUNTER — Other Ambulatory Visit: Payer: Self-pay

## 2019-05-30 VITALS — BP 222/84 | HR 71 | Temp 97.7°F | Resp 16 | Ht 60.0 in | Wt 156.0 lb

## 2019-05-30 DIAGNOSIS — Z992 Dependence on renal dialysis: Secondary | ICD-10-CM

## 2019-05-30 DIAGNOSIS — N186 End stage renal disease: Secondary | ICD-10-CM

## 2019-05-30 NOTE — Progress Notes (Signed)
    Postoperative Access Visit   History of Present Illness   Ann Bryan is a 59 y.o. year old female who presents for postoperative follow-up for: left brachiocephalic fistula superficialization (Date: 05/20/19).  Fistula was initially created 01/21/19 by Dr. Trula Slade.  The patient's wounds are healing well.  The patient denies steal symptoms.  The patient is able to complete their activities of daily living.  She is dialyzing via R IJ TDC on a TTS schedule under the care of Dr. Jimmy Footman.  She is hypertensive today with systolic pressure >001.  She denies any chest pain, SOB, dizziness, vision changes.  She believes she has too much fluid on her and plans to discuss this with the dialysis team tomorrow.  She is also having trouble completing dialysis treatments due to a severe itching sensation after only about 2 hours of treatment.  She is actively discussing this with Dr. Jimmy Footman and her Dermatologist.  Physical Examination   Vitals:   05/30/19 0818 05/30/19 0821  BP: (!) 207/82 (!) 222/84  Pulse: 71 71  Resp: 16   Temp: 97.7 F (36.5 C)   SpO2: 99%   Weight: 156 lb (70.8 kg)   Height: 5' (1.524 m)    Body mass index is 30.47 kg/m.  left arm Incisions are healing well however still some scabbing remaining; palpable radial pulse, hand grip is 5/5, sensation in digits is intact, palpable thrill, bruit can be auscultated     Medical Decision Making   Ann Bryan is a 59 y.o. year old female who presents s/p left superficialization of brachiocephalic fistula   Patent brachiocephalic fistula without signs or symptoms of steal syndrome  The patient's access will be ready for use 06/14/19  The patient's tunneled dialysis catheter can be removed when Nephrology is comfortable with the performance of the fistula  The patient may follow up on a prn basis   Dagoberto Ligas PA-C [facility] [place] Office: [facility] MD: Trula Slade

## 2019-05-31 NOTE — Progress Notes (Signed)
Name: Ann Bryan  Age/ Sex: 59 y.o., female   MRN/ DOB: [id number], [birth year]     PCP: Ladell Pier, MD   Reason for Endocrinology Evaluation: Type 2 Diabetes Mellitus  Initial Endocrine Consultative Visit: 02/01/2018    PATIENT IDENTIFIER: Ann Bryan is a 59 y.o. female with a past medical history of T2DM, HTN and Dyslipidemia. The patient has followed with Endocrinology clinic since 02/01/2018 for consultative assistance with management of her diabetes.  DIABETIC HISTORY:  Ann Bryan was diagnosed with DM in the 1980's,she was initially on metformin only but 10 yrs after diagnosis , insulin was started. She also has been on Januvia and Victoza in the past.  Her hemoglobin A1c has ranged from 7.8% in 2021, peaking at 13.3% in 2016.   SUBJECTIVE:   During the last visit (02/02/2019): Saw Dr. Dwyane Dee. Switched insulin Mix to MDI regimen   Today (06/03/2019): Ann Bryan is here for transition of care.  She checks her blood sugars 1 times daily.. The patient has had hypoglycemic episodes since the last clinic visit, which typically occur 3 x / month- most often occuring during variable times, no pattern . The patient is not symptomatic with these episodes.   She eats 1 meals , snacks occasionally .  She has pruritus   Tue, Thursday and Saturday  Lives alone   HOME DIABETES REGIMEN:  Basaglar 26 units daily  Novolog 10-14 units she bases it on what she eats     Statin: yes ACE-I/ARB: no    METER DOWNLOAD SUMMARY: Date range evaluated: 4/21-5/20/2021 Fingerstick Blood Glucose Tests = 27 Average Number Tests/Day = 0.9 Overall Mean FS Glucose = 242 Standard Deviation = 186  BG Ranges: Low = 38 High = High    Hypoglycemic Events/30 Days: BG < 50 = 2 Episodes of symptomatic severe hypoglycemia = 0    DIABETIC COMPLICATIONS: Microvascular complications:  ESRD on HD, retinopathy (s/p laser) , neuropathy  Last Eye Exam: Completed years ago    Macrovascular complications:  Denies: CAD, CVA, PVD   HISTORY:  Past Medical History:  Past Medical History:  Diagnosis Date  . Anemia   . Cataract    right eye  . Complication of anesthesia     " I woke up itching from anesthesia many years ago."   . Depression    at times  . Diabetes mellitus 1986    poorly controlled last hemoglobin A1c  10.5 on January 01, 2010, increase since June 2011 when hemoglobin A1c was 9.3 , determined to be most likely secondary to non-compliance.  . Diabetic nephropathy ([place])   . Diabetic retinopathy   . Dyspnea    "when I have too much fluid"  . End stage renal disease on dialysis [facility])    T/Th/Sat [address]  . Glaucoma   . History of blood transfusion   . Hyperlipidemia   . Hypertension   . Nausea and vomiting   . Neuropathy   . Retinopathy   . Wears glasses    Past Surgical History:  Past Surgical History:  Procedure Laterality Date  . ABDOMINAL HYSTERECTOMY   2004  . AV FISTULA PLACEMENT Left 01/21/2019   Procedure: BRACHIOCEPHALIC ARTERIOVENOUS (AV) FISTULA CREATION LEFT ARM;  Surgeon: Serafina Mitchell, MD;  Location: [place];  Service: Vascular;  Laterality: Left;  . EYE SURGERY    . FISTULA SUPERFICIALIZATION Left 05/20/2019   Procedure: FISTULA SUPERFICIALIZATION LEFT PROXIMAL FISTULA;  Surgeon: Serafina Mitchell, MD;  Location: [facility] OR;  Service: Vascular;  Laterality: Left;  . IR FLUORO GUIDE CV LINE RIGHT  11/15/2018  . IR US GUIDE VASC ACCESS RIGHT  11/15/2018  . MYOMECTOMY  1994  . REFRACTIVE SURGERY Bilateral    Social History:  reports that she has never smoked. She has never used smokeless tobacco. She reports that she does not drink alcohol or use drugs. Family History:  Family History  Problem Relation Age of Onset  . Diabetes Mother   . Stroke Mother   . Hypertension Mother   . Alcohol abuse Mother   . Depression Father        Committed suicide  . Alcohol abuse Maternal Uncle      HOME MEDICATIONS: Allergies  as of 06/01/2019      Reactions   Biopatch Protective Disk-chg [chlorhexidine]    States rash with use of CHG bath   Doxycycline Nausea And Vomiting   Victoza [liraglutide] Nausea And Vomiting      Medication List       Accurate as of Jun 01, 2019 11:59 PM. If you have any questions, ask your nurse or doctor.        amLODipine 10 MG tablet Commonly known as: NORVASC TAKE ONE TABLET BY MOUTH EVERY DAY   atorvastatin 20 MG tablet Commonly known as: LIPITOR TAKE 1 TABLET (20 MG TOTAL) BY MOUTH DAILY.   Basaglar KwikPen 100 UNIT/ML Inject 0.26 mLs (26 Units total) into the skin at bedtime. Taking 26 units at hs   calcium acetate 667 MG capsule Commonly known as: PHOSLO Take 667-2,001 mg by mouth See admin instructions. 2,001 mg with meals 3 times daily and 667 mg 2 times daily with snacks   carvedilol 25 MG tablet Commonly known as: COREG Take 1.5 tablets (37.5 mg total) by mouth 2 (two) times daily with a meal. What changed: additional instructions   cinacalcet 60 MG tablet Commonly known as: SENSIPAR Take 60 mg by mouth daily.   Contour Next Test test strip Generic drug: glucose blood 4x daily What changed:  when to take this additional instructions Another medication with the same name was removed. Continue taking this medication, and follow the directions you see here. Changed by: Dorita Sciara, MD   INSULIN SYRINGE 1CC/31GX5/16" 31G X 5/16" 1 ML Misc Use as directed.  BD 49m x 31G needles   Insulin Syringes (Disposable) U-100 0.5 ML Misc 1 each by Does not apply route 2 (two) times daily.   methocarbamol 500 MG tablet Commonly known as: ROBAXIN Take 500 mg by mouth 2 (two) times daily as needed for muscle spasms.   multivitamin Tabs tablet Take 1 tablet by mouth daily.   NovoLOG FlexPen 100 UNIT/ML FlexPen Generic drug: insulin aspart INJECT 10-14 UNITS INTO THE SKIN THREE TIMES DAILY WITH MEALS. What changed: additional instructions   True  Metrix Meter w/Device Kit 1 each by Does not apply route as needed.   TRUEplus Lancets 28G Misc 1 each by Does not apply route 3 (three) times daily.   TRUEplus Pen Needles 31G X 5 MM Misc Generic drug: Insulin Pen Needle USE AS DIRECTED   zolpidem 5 MG tablet Commonly known as: AMBIEN Take 1 tablet (5 mg total) by mouth at bedtime as needed. Each prescription to last a month. What changed: reasons to take this        OBJECTIVE:   Vital Signs: BP (!) 162/82 (BP Location: Right Arm, Patient Position: Sitting, Cuff Size: Large)  Pulse 78   Temp 98.2 F (36.8 C)   Ht 5' (1.524 m)   Wt 155 lb 12.8 oz (70.7 kg)   LMP 03/13/2002   SpO2 98%   BMI 30.43 kg/m   Wt Readings from Last 3 Encounters:  06/01/19 155 lb 12.8 oz (70.7 kg)  05/30/19 156 lb (70.8 kg)  05/20/19 154 lb (69.9 kg)     Exam: General: Ann Bryan appears well and is in NAD  Neck: General: Supple without adenopathy. Thyroid: Thyroid size normal.  No goiter or nodules appreciated. No thyroid bruit.  Lungs: Clear with good BS bilat with no rales, rhonchi, or wheezes  Heart: RRR with normal S1 and S2 and no gallops; no murmurs; no rub  Abdomen: Normoactive bowel sounds, soft, nontender, without masses or organomegaly palpable  Extremities: No pretibial edema.  Neuro: MS is good with appropriate affect, Ann Bryan is alert and Ox3             DATA REVIEWED:  Lab Results  Component Value Date   HGBA1C 9.8 (H) 05/06/2019   HGBA1C 7.8 (H) 01/21/2019   HGBA1C 7.3 (A) 11/18/2018   Lab Results  Component Value Date   MICROALBUR 58.6 (H) 10/20/2014   LDLCALC 73 11/18/2018   CREATININE 8.75 (H) 05/20/2019   Lab Results  Component Value Date   MICRALBCREAT 4,081 (H) 11/18/2018     Lab Results  Component Value Date   CHOL 136 11/18/2018   HDL 35 (L) 11/18/2018   LDLCALC 73 11/18/2018   TRIG 164 (H) 11/18/2018   CHOLHDL 3.9 11/18/2018         ASSESSMENT / PLAN / RECOMMENDATIONS:   1) Type 2 Diabetes  Mellitus, Poorly controlled, With ESRD on HD, retinopathic and neuropathic  complications - Most recent A1c of 9.8 %. Goal A1c < 7.0 %.    - I have discussed with the patient the pathophysiology of diabetes. We went over the natural progression of the disease. We talked about both insulin resistance and insulin deficiency. We stressed the importance of lifestyle changes including diet and exercise. I explained the complications associated with diabetes including retinopathy, nephropathy, neuropathy as well as increased risk of cardiovascular disease. We went over the benefit seen with glycemic control.  I explained to the patient that diabetic patients are at higher than normal risk for amputations.   - Ann Bryan does not have  Eating pattern, it seems she eats small meals and tends to guess on prandial dosing , Ann Bryan states " I take what I think is enough for the meal" hence the Ann Bryan ends up with fluctauating BG's ranging from severe hypoglycemia and hyperglycemia.  - I have suggested carb counting as a better way in dosing prandial insulin, Ann Bryan will be referred to our RD for further training on this. She will be provided with a correction scale to curb guessing.  MEDICATIONS: Continue Basaglar 26 units daily  Novolig: I:C ratio of 1:15 CF: Novolog ( BG-120/45)  EDUCATION / INSTRUCTIONS: BG monitoring instructions: Patient is instructed to check her blood sugars 4 times a day, before meals and bedtime  Call [facility] [facility] if: BG persistently < 70 or > 300. I reviewed the Rule of 15 for the treatment of hypoglycemia in detail with the patient. Literature supplied.  REFERRALS: RD   2) Diabetic complications:  Eye: Does  have known diabetic retinopathy.  Neuro/ Feet: Does  have known diabetic peripheral neuropathy .  Renal: Patient does  have known baseline ESRD on HD.  3) Lipids: Patient is on atorvastatin    F/U in 3 months    Signed electronically by: Mack Guise,  MD  [facility]  [facility] [address] [place], [place] [address] Phone: [phone] FAX: [phone]   CC: Ladell Pier, MD [place] [address] Phone: [phone]  Fax: [phone]  Return to Endocrinology clinic as below: Future Appointments  Date Time Provider [place]  06/10/2019 10:50 AM Ladell Pier, MD [facility] None  06/15/2019  9:50 AM [facility] MM 3 [facility]  08/05/2019  9:10 AM Ladell Pier, MD [facility] None  09/05/2019 11:10 AM Kadijah Shamoon, Melanie Crazier, MD [facility] None

## 2019-06-01 ENCOUNTER — Encounter: Payer: Self-pay | Admitting: Internal Medicine

## 2019-06-01 ENCOUNTER — Ambulatory Visit (INDEPENDENT_AMBULATORY_CARE_PROVIDER_SITE_OTHER): Payer: Medicare Other | Admitting: Internal Medicine

## 2019-06-01 ENCOUNTER — Other Ambulatory Visit: Payer: Medicaid Other

## 2019-06-01 ENCOUNTER — Other Ambulatory Visit: Payer: Self-pay

## 2019-06-01 VITALS — BP 162/82 | HR 78 | Temp 98.2°F | Ht 60.0 in | Wt 155.8 lb

## 2019-06-01 DIAGNOSIS — E1142 Type 2 diabetes mellitus with diabetic polyneuropathy: Secondary | ICD-10-CM | POA: Diagnosis not present

## 2019-06-01 DIAGNOSIS — E1165 Type 2 diabetes mellitus with hyperglycemia: Secondary | ICD-10-CM

## 2019-06-01 DIAGNOSIS — E785 Hyperlipidemia, unspecified: Secondary | ICD-10-CM

## 2019-06-01 DIAGNOSIS — N186 End stage renal disease: Secondary | ICD-10-CM

## 2019-06-01 DIAGNOSIS — Z794 Long term (current) use of insulin: Secondary | ICD-10-CM

## 2019-06-01 DIAGNOSIS — Z992 Dependence on renal dialysis: Secondary | ICD-10-CM

## 2019-06-01 DIAGNOSIS — E1122 Type 2 diabetes mellitus with diabetic chronic kidney disease: Secondary | ICD-10-CM

## 2019-06-01 DIAGNOSIS — E113599 Type 2 diabetes mellitus with proliferative diabetic retinopathy without macular edema, unspecified eye: Secondary | ICD-10-CM

## 2019-06-01 DIAGNOSIS — IMO0002 Reserved for concepts with insufficient information to code with codable children: Secondary | ICD-10-CM

## 2019-06-01 DIAGNOSIS — E1121 Type 2 diabetes mellitus with diabetic nephropathy: Secondary | ICD-10-CM

## 2019-06-01 MED ORDER — CONTOUR NEXT TEST VI STRP: ORAL_STRIP | 12 refills | Status: DC

## 2019-06-01 NOTE — Patient Instructions (Addendum)
-   Continue Basaglar at 26 units daily  - Novolog for meals : give 1 units of Novolog for every 15 grams of carbohydrates you are getting ready to eat ( Or take the total amount of carbohydrates you are getting ready to eat and divide by 15 = the amount of novolog you should take) please round down   Novolog correctional insulin: ADD extra units on insulin to your meal-time Novolog dose if your blood sugars are higher than . Use the scale below to help guide you:   Blood sugar before meal Number of units to inject  Less than  165 0 unit   176 -  210 1 units   211 -  245 2 units  246 -  280 3 units  281 -  315 4 units  316 -  350 5 units  351 -  385 6 units  386 -  420 7 units  421 -  455 8 units  456- 490 9 units  491- 525 10 units  526- 560 11 units             HOW TO TREAT LOW BLOOD SUGARS (Blood sugar LESS THAN 70 MG/DL)  Please follow the RULE OF 15 for the treatment of hypoglycemia treatment (when your (blood sugars are less than 70 mg/dL)    STEP 1: Take 15 grams of carbohydrates when your blood sugar is low, which includes:   3-4 GLUCOSE TABS  OR  3-4 OZ OF JUICE OR REGULAR SODA OR  ONE TUBE OF GLUCOSE GEL     STEP 2: RECHECK blood sugar in 15 MINUTES STEP 3: If your blood sugar is still low at the 15 minute recheck --> then, go back to STEP 1 and treat AGAIN with another 15 grams of carbohydrates.

## 2019-06-03 DIAGNOSIS — E1142 Type 2 diabetes mellitus with diabetic polyneuropathy: Secondary | ICD-10-CM | POA: Insufficient documentation

## 2019-06-03 DIAGNOSIS — E113599 Type 2 diabetes mellitus with proliferative diabetic retinopathy without macular edema, unspecified eye: Secondary | ICD-10-CM | POA: Insufficient documentation

## 2019-06-03 DIAGNOSIS — E785 Hyperlipidemia, unspecified: Secondary | ICD-10-CM | POA: Insufficient documentation

## 2019-06-03 DIAGNOSIS — Z794 Long term (current) use of insulin: Secondary | ICD-10-CM | POA: Insufficient documentation

## 2019-06-08 ENCOUNTER — Ambulatory Visit: Payer: Medicaid Other | Admitting: Endocrinology

## 2019-06-10 ENCOUNTER — Ambulatory Visit: Payer: Medicare Other | Attending: Internal Medicine | Admitting: Internal Medicine

## 2019-06-10 ENCOUNTER — Other Ambulatory Visit: Payer: Self-pay

## 2019-06-10 DIAGNOSIS — E1165 Type 2 diabetes mellitus with hyperglycemia: Secondary | ICD-10-CM | POA: Diagnosis not present

## 2019-06-10 DIAGNOSIS — N186 End stage renal disease: Secondary | ICD-10-CM

## 2019-06-10 DIAGNOSIS — Z794 Long term (current) use of insulin: Secondary | ICD-10-CM

## 2019-06-10 DIAGNOSIS — Z09 Encounter for follow-up examination after completed treatment for conditions other than malignant neoplasm: Secondary | ICD-10-CM

## 2019-06-10 DIAGNOSIS — L299 Pruritus, unspecified: Secondary | ICD-10-CM | POA: Diagnosis not present

## 2019-06-10 DIAGNOSIS — G4700 Insomnia, unspecified: Secondary | ICD-10-CM

## 2019-06-10 DIAGNOSIS — Z992 Dependence on renal dialysis: Secondary | ICD-10-CM

## 2019-06-10 MED ORDER — ZOLPIDEM TARTRATE 5 MG PO TABS: 5.0000 mg | ORAL_TABLET | Freq: Every evening | ORAL | 1 refills | Status: DC | PRN

## 2019-06-10 NOTE — Progress Notes (Signed)
Virtual Visit via Telephone Note Due to current restrictions/limitations of in-office visits due to the COVID-19 pandemic, this scheduled clinical appointment was converted to a telehealth visit  I connected with Ann Bryan on 06/10/19 at 11:33 A.M by telephone and verified that I am speaking with the correct person using two identifiers.  I am in my office.  The patient is at home.  Only the patient and myself participated in this encounter.  I discussed the limitations, risks, security and privacy concerns of performing an evaluation and management service by telephone and the availability of in person appointments. I also discussed with the patient that there may be a patient responsible charge related to this service. The patient expressed understanding and agreed to proceed.   History of Present Illness: Patient with history of DM, HTN, HL, seasonal allergies,anxiety/depression, glucoma,nephrotic range proteinuria with ESRDand insomnia.  Patient last seen 05/06/2019.  Purpose of today's visit is transition of care.  Pt hosp 5/14-15/2021 for superficialization of LT radiocephalic AV fistula which she states went well Not getting full HD treatments because she still gets severe itching during the sessions.  Phosphate level is coming down.  The nephrologist now thinks the solution being used during HD may be the problem.  They plan to change to using a different solution. Pt also saw dermatologist.  Given strong steroid cream which did not help.  Told by dermatologist that they may try light therapy. Pt is very frustrated. She skipped one HD session because of it.   DM: saw Dr. Kelton Pillar since last visit. She is pleased with her. Has wide BS swing.  Getting a Mining engineer.  Has f/u   Insomnia: Requests refill on Ambien Outpatient Encounter Medications as of 06/10/2019  Medication Sig Note  . amLODipine (NORVASC) 10 MG tablet TAKE ONE TABLET BY MOUTH EVERY DAY (Patient taking differently: Take  10 mg by mouth daily. )   . atorvastatin (LIPITOR) 20 MG tablet TAKE 1 TABLET (20 MG TOTAL) BY MOUTH DAILY.   Marland Kitchen Blood Glucose Monitoring Suppl (TRUE METRIX METER) w/Device KIT 1 each by Does not apply route as needed.   . calcium acetate (PHOSLO) 667 MG capsule Take 667-2,001 mg by mouth See admin instructions. 2,001 mg with meals 3 times daily and 667 mg 2 times daily with snacks   . carvedilol (COREG) 25 MG tablet Take 1.5 tablets (37.5 mg total) by mouth 2 (two) times daily with a meal. (Patient taking differently: Take 37.5 mg by mouth 2 (two) times daily with a meal. Except on dialysis days none that morning)   . cinacalcet (SENSIPAR) 60 MG tablet Take 60 mg by mouth daily.   Marland Kitchen glucose blood (CONTOUR NEXT TEST) test strip 4x daily   . insulin aspart (NOVOLOG FLEXPEN) 100 UNIT/ML FlexPen INJECT 10-14 UNITS INTO THE SKIN THREE TIMES DAILY WITH MEALS. (Patient taking differently: INJECT 5-14 UNITS INTO THE SKIN THREE TIMES DAILY WITH MEALS.)   . Insulin Glargine (BASAGLAR KWIKPEN) 100 UNIT/ML SOPN Inject 0.26 mLs (26 Units total) into the skin at bedtime. Taking 26 units at hs 05/20/2019: Took 13 units at bedtime  . Insulin Syringe-Needle U-100 (INSULIN SYRINGE 1CC/31GX5/16") 31G X 5/16" 1 ML MISC Use as directed.  BD 62m x 31G needles   . Insulin Syringes, Disposable, U-100 0.5 ML MISC 1 each by Does not apply route 2 (two) times daily.   . methocarbamol (ROBAXIN) 500 MG tablet Take 500 mg by mouth 2 (two) times daily as needed for muscle  spasms.    . multivitamin (RENA-VIT) TABS tablet Take 1 tablet by mouth daily. 05/10/2019: Capsule   . TRUEPLUS LANCETS 28G MISC 1 each by Does not apply route 3 (three) times daily.   . TRUEPLUS PEN NEEDLES 31G X 5 MM MISC USE AS DIRECTED   . zolpidem (AMBIEN) 5 MG tablet Take 1 tablet (5 mg total) by mouth at bedtime as needed. Each prescription to last a month. (Patient taking differently: Take 5 mg by mouth at bedtime as needed for sleep. Each prescription to  last a month.)    No facility-administered encounter medications on file as of 06/10/2019.      Observations/Objective: No direct observation done as this was a telephone encounter  Assessment and Plan: 1. Hospital discharge follow-up 2. ESRD (end stage renal disease) on dialysis [facility]) -Followed by nephrologist.  Advised against skipping dialysis sessions.  3. Itchy skin I empathized with her plight.  Hopefully the change in dialysis solution will make a difference for her.  4. Type 2 diabetes mellitus with hyperglycemia, with long-term current use of insulin ([place]) Recently seen by endocrinologist.  5. Insomnia, unspecified type - zolpidem (AMBIEN) 5 MG tablet; Take 1 tablet (5 mg total) by mouth at bedtime as needed. Each prescription to last a month.  Dispense: 12 tablet; Refill: 1   Follow Up Instructions: 2 mths   I discussed the assessment and treatment plan with the patient. The patient was provided an opportunity to ask questions and all were answered. The patient agreed with the plan and demonstrated an understanding of the instructions.   The patient was advised to call back or seek an in-person evaluation if the symptoms worsen or if the condition fails to improve as anticipated.  I provided 20 minutes of non-face-to-face time during this encounter.   Karle Plumber, MD

## 2019-06-10 NOTE — Progress Notes (Signed)
Pt states she has been itching like crazy

## 2019-06-13 ENCOUNTER — Telehealth: Payer: Self-pay | Admitting: Internal Medicine

## 2019-06-13 NOTE — Telephone Encounter (Signed)
[facility] called to advise that patient is switching to them for prescription refill.  They sent a notification 06/13/19 @ 9:00 AM to that effect.

## 2019-06-14 ENCOUNTER — Telehealth: Payer: Self-pay | Admitting: Internal Medicine

## 2019-06-14 DIAGNOSIS — I10 Essential (primary) hypertension: Secondary | ICD-10-CM

## 2019-06-14 DIAGNOSIS — E785 Hyperlipidemia, unspecified: Secondary | ICD-10-CM

## 2019-06-14 MED ORDER — CARVEDILOL 25 MG PO TABS: 37.5000 mg | ORAL_TABLET | Freq: Two times a day (BID) | ORAL | 0 refills | Status: DC

## 2019-06-14 MED ORDER — AMLODIPINE BESYLATE 10 MG PO TABS: 10.0000 mg | ORAL_TABLET | Freq: Every day | ORAL | 0 refills | Status: DC

## 2019-06-14 MED ORDER — ATORVASTATIN CALCIUM 20 MG PO TABS: 20.0000 mg | ORAL_TABLET | Freq: Every day | ORAL | 0 refills | Status: DC

## 2019-06-14 NOTE — Telephone Encounter (Signed)
Rx sent 

## 2019-06-14 NOTE — Telephone Encounter (Signed)
1) Medication(s) Requested (by name):amLODipine (NORVASC) 10 MG tablet atorvastatin (LIPITOR) 20 MG tablet  carvedilol (COREG) 25 MG tablet    2) Pharmacy of Choice: [place]    3) Special Requests:   Approved medications will be sent to the pharmacy, we will reach out if there is an issue.  Requests made after 3pm may not be addressed until the following business day!  If a patient is unsure of the name of the medication(s) please note and ask patient to call back when they are able to provide all info, do not send to responsible party until all information is available!

## 2019-06-14 NOTE — Telephone Encounter (Signed)
Pharmacy as been updated in pt chart and previous pharmacies deleted.

## 2019-06-15 ENCOUNTER — Ambulatory Visit: Payer: Medicaid Other

## 2019-06-16 ENCOUNTER — Other Ambulatory Visit: Payer: Self-pay

## 2019-06-16 MED ORDER — NOVOLOG FLEXPEN 100 UNIT/ML ~~LOC~~ SOPN: PEN_INJECTOR | SUBCUTANEOUS | 6 refills | Status: DC

## 2019-06-17 ENCOUNTER — Ambulatory Visit
Admission: RE | Admit: 2019-06-17 | Discharge: 2019-06-17 | Disposition: A | Discharge status: Home / Self Care | Payer: Medicare Other | Source: Ambulatory Visit | Attending: Internal Medicine | Admitting: Internal Medicine

## 2019-06-17 ENCOUNTER — Other Ambulatory Visit: Payer: Self-pay

## 2019-06-17 DIAGNOSIS — Z1231 Encounter for screening mammogram for malignant neoplasm of breast: Secondary | ICD-10-CM

## 2019-06-20 ENCOUNTER — Ambulatory Visit: Payer: Medicare Other | Admitting: Internal Medicine

## 2019-06-22 ENCOUNTER — Other Ambulatory Visit: Payer: Self-pay

## 2019-06-22 DIAGNOSIS — Z794 Long term (current) use of insulin: Secondary | ICD-10-CM

## 2019-06-22 MED ORDER — CONTOUR NEXT TEST VI STRP: 1.0000 | ORAL_STRIP | Freq: Four times a day (QID) | 0 refills | Status: DC

## 2019-06-24 ENCOUNTER — Other Ambulatory Visit: Payer: Self-pay

## 2019-06-24 DIAGNOSIS — Z794 Long term (current) use of insulin: Secondary | ICD-10-CM

## 2019-06-24 MED ORDER — BD PEN NEEDLE MINI U/F 31G X 5 MM MISC: 1.0000 | Freq: Four times a day (QID) | 0 refills | Status: DC

## 2019-07-08 ENCOUNTER — Other Ambulatory Visit: Payer: Self-pay | Admitting: Internal Medicine

## 2019-07-08 DIAGNOSIS — G4700 Insomnia, unspecified: Secondary | ICD-10-CM

## 2019-07-08 NOTE — Telephone Encounter (Signed)
Requested medication (s) are due for refill today: no  Requested medication (s) are on the active medication list: yes  Last refill:  06/10/19 #12 with 1 refill  Future visit scheduled: yes  Notes to clinic:  Refill not delegated per protocol    Requested Prescriptions  Pending Prescriptions Disp Refills   zolpidem (AMBIEN) 5 MG tablet [Pharmacy Med Name: zolpidem 5 mg tablet] 12 tablet 1    Sig: Take 1 tablet (5 mg total) by mouth at bedtime as needed. Each prescription to last a month.      Not Delegated - Psychiatry:  Anxiolytics/Hypnotics Failed - 07/08/2019  4:23 PM      Failed - This refill cannot be delegated      Failed - Urine Drug Screen completed in last 360 days.      Passed - Valid encounter within last 6 months    Recent Outpatient Visits           4 weeks ago Hospital discharge follow-up   [place] Karle Plumber B, MD   2 months ago Uncontrolled type 2 diabetes mellitus with nephropathy [facility])   [place] Karle Plumber B, MD   7 months ago Uncontrolled type 2 diabetes mellitus with nephropathy [facility])   [place], Deborah B, MD   1 year ago Uncontrolled type 2 diabetes mellitus with nephropathy [facility])   [place], MD   1 year ago Uncontrolled type 2 diabetes mellitus with nephropathy [facility])   [place], MD       Future Appointments             In 4 weeks Ladell Pier, MD [place]

## 2019-08-03 ENCOUNTER — Other Ambulatory Visit: Payer: Self-pay | Admitting: Endocrinology

## 2019-08-05 ENCOUNTER — Telehealth: Payer: Medicare Other | Admitting: Internal Medicine

## 2019-09-02 ENCOUNTER — Other Ambulatory Visit: Payer: Self-pay | Admitting: Internal Medicine

## 2019-09-02 DIAGNOSIS — E785 Hyperlipidemia, unspecified: Secondary | ICD-10-CM

## 2019-09-02 DIAGNOSIS — I10 Essential (primary) hypertension: Secondary | ICD-10-CM

## 2019-09-05 ENCOUNTER — Ambulatory Visit: Payer: Medicare Other | Admitting: Internal Medicine

## 2019-09-05 NOTE — Progress Notes (Deleted)
Name: Ann Bryan  Age/ Sex: 59 y.o., female   MRN/ DOB: [id number], [birth year]     PCP: Ladell Pier, MD   Reason for Endocrinology Evaluation: Type 2 Diabetes Mellitus  Initial Endocrine Consultative Visit: 02/01/2018    PATIENT IDENTIFIER: [name] is a 59 y.o. female with a past medical history of T2DM, HTN and Dyslipidemia. The patient has followed with Endocrinology clinic since 02/01/2018 for consultative assistance with management of her diabetes.  DIABETIC HISTORY:  Ann Bryan was diagnosed with DM in the 1980's,she was initially on metformin only but 10 yrs after diagnosis , insulin was started. She also has been on Januvia and Victoza in the past.  Her hemoglobin A1c has ranged from 7.8% in 2021, peaking at 13.3% in 2016.   In 01/2019 Dr. Dwyane Dee switched insulin Mix to MDI regimen.  Transitioned care to me in 05/2019 We have attempted to refer her to our RD for carb counting but she never scheduled despite their multiple attempts to reach out to her . (07/2019) SUBJECTIVE:   During the last visit (06/01/2019): A1c 9.8% We adjusted MDI regimen   Today (09/05/2019): Ann Bryan is here for a follow up on diabetes management.   She checks her blood sugars 1 times daily.. The patient has had hypoglycemic episodes since the last clinic visit, which typically occur 3 x / month- most often occuring during variable times, no pattern . The patient is not symptomatic with these episodes.   She eats 1 meals , snacks occasionally .  She has pruritus   Tue, Thursday and Saturday  Lives alone   HOME DIABETES REGIMEN:  Basaglar 26 units daily  Novolog I: C ratio of 1:15 CF: Novolog (BG -120/45)     Statin: yes ACE-I/ARB: no    METER DOWNLOAD SUMMARY: Date range evaluated: 4/21-5/20/2021 Fingerstick Blood Glucose Tests = 27 Average Number Tests/Day = 0.9 Overall Mean FS Glucose = 242 Standard Deviation = 186  BG Ranges: Low = 38 High = High    Hypoglycemic  Events/30 Days: BG < 50 = 2 Episodes of symptomatic severe hypoglycemia = 0    DIABETIC COMPLICATIONS: Microvascular complications:   ESRD on HD, retinopathy (s/p laser) , neuropathy   Last Eye Exam: Completed years ago   Macrovascular complications:   Denies: CAD, CVA, PVD   HISTORY:  Past Medical History:  Past Medical History:  Diagnosis Date  . Anemia   . Cataract    right eye  . Complication of anesthesia     " I woke up itching from anesthesia many years ago."   . Depression    at times  . Diabetes mellitus 1986    poorly controlled last hemoglobin A1c  10.5 on January 01, 2010, increase since June 2011 when hemoglobin A1c was 9.3 , determined to be most likely secondary to non-compliance.  . Diabetic nephropathy ([place])   . Diabetic retinopathy   . Dyspnea    "when I have too much fluid"  . End stage renal disease on dialysis [facility])    T/Th/Sat [address]  . Glaucoma   . History of blood transfusion   . Hyperlipidemia   . Hypertension   . Nausea and vomiting   . Neuropathy   . Retinopathy   . Wears glasses    Past Surgical History:  Past Surgical History:  Procedure Laterality Date  . ABDOMINAL HYSTERECTOMY   2004  . AV FISTULA PLACEMENT Left 01/21/2019   Procedure: BRACHIOCEPHALIC  ARTERIOVENOUS (AV) FISTULA CREATION LEFT ARM;  Surgeon: Serafina Mitchell, MD;  Location: [place];  Service: Vascular;  Laterality: Left;  . EYE SURGERY    . FISTULA SUPERFICIALIZATION Left 05/20/2019   Procedure: FISTULA SUPERFICIALIZATION LEFT PROXIMAL FISTULA;  Surgeon: Serafina Mitchell, MD;  Location: [place];  Service: Vascular;  Laterality: Left;  . IR FLUORO GUIDE CV LINE RIGHT  11/15/2018  . IR US GUIDE VASC ACCESS RIGHT  11/15/2018  . MYOMECTOMY  1994  . REFRACTIVE SURGERY Bilateral     Social History:  reports that she has never smoked. She has never used smokeless tobacco. She reports that she does not drink alcohol and does not use drugs. Family History:  Family  History  Problem Relation Age of Onset  . Diabetes Mother   . Stroke Mother   . Hypertension Mother   . Alcohol abuse Mother   . Depression Father        Committed suicide  . Alcohol abuse Maternal Uncle      HOME MEDICATIONS: Allergies as of 09/05/2019      Reactions   Biopatch Protective Disk-chg [chlorhexidine]    States rash with use of CHG bath   Doxycycline Nausea And Vomiting   Victoza [liraglutide] Nausea And Vomiting      Medication List       Accurate as of September 05, 2019  7:05 AM. If you have any questions, ask your nurse or doctor.        amLODipine 10 MG tablet Commonly known as: NORVASC Take 1 tablet by mouth daily.   atorvastatin 20 MG tablet Commonly known as: LIPITOR Take 1 tablet by mouth daily.   B-D UF III MINI PEN NEEDLES 31G X 5 MM Misc Generic drug: Insulin Pen Needle 1 each by Does not apply route 4 (four) times daily. E11.9   Basaglar KwikPen 100 UNIT/ML Inject 0.26 mLs (26 Units total) into the skin at bedtime. Taking 26 units at hs   calcium acetate 667 MG capsule Commonly known as: PHOSLO Take 667-2,001 mg by mouth See admin instructions. 2,001 mg with meals 3 times daily and 667 mg 2 times daily with snacks   carvedilol 25 MG tablet Commonly known as: COREG Take 1 and 1/2 tablets by mouth twice daily with a meal.   cinacalcet 60 MG tablet Commonly known as: SENSIPAR Take 60 mg by mouth daily.   Contour Next Test test strip Generic drug: glucose blood 1 each by Other route 4 (four) times daily. E11.65   INSULIN SYRINGE 1CC/31GX5/16" 31G X 5/16" 1 ML Misc Use as directed.  BD 69m x 31G needles   Insulin Syringes (Disposable) U-100 0.5 ML Misc 1 each by Does not apply route 2 (two) times daily.   methocarbamol 500 MG tablet Commonly known as: ROBAXIN Take 500 mg by mouth 2 (two) times daily as needed for muscle spasms.   multivitamin Tabs tablet Take 1 tablet by mouth daily.   NovoLOG FlexPen 100 UNIT/ML  FlexPen Generic drug: insulin aspart INJECT 10-14 UNITS INTO THE SKIN THREE TIMES DAILY WITH MEALS.   True Metrix Meter w/Device Kit 1 each by Does not apply route as needed.   TRUEplus Lancets 28G Misc 1 each by Does not apply route 3 (three) times daily.   zolpidem 5 MG tablet Commonly known as: AMBIEN Take 1 tablet (5 mg total) by mouth at bedtime as needed. Each prescription to last a month.        OBJECTIVE:  Vital Signs: LMP 03/13/2002   Wt Readings from Last 3 Encounters:  06/01/19 155 lb 12.8 oz (70.7 kg)  05/30/19 156 lb (70.8 kg)  05/20/19 154 lb (69.9 kg)     Exam: General: Pt appears well and is in NAD  Neck: General: Supple without adenopathy. Thyroid: Thyroid size normal.  No goiter or nodules appreciated. No thyroid bruit.  Lungs: Clear with good BS bilat with no rales, rhonchi, or wheezes  Heart: RRR with normal S1 and S2 and no gallops; no murmurs; no rub  Abdomen: Normoactive bowel sounds, soft, nontender, without masses or organomegaly palpable  Extremities: No pretibial edema.  Neuro: MS is good with appropriate affect, pt is alert and Ox3             DATA REVIEWED:  Lab Results  Component Value Date   HGBA1C 9.8 (H) 05/06/2019   HGBA1C 7.8 (H) 01/21/2019   HGBA1C 7.3 (A) 11/18/2018   Lab Results  Component Value Date   MICROALBUR 58.6 (H) 10/20/2014   LDLCALC 73 11/18/2018   CREATININE 8.75 (H) 05/20/2019   Lab Results  Component Value Date   MICRALBCREAT 4,081 (H) 11/18/2018     Lab Results  Component Value Date   CHOL 136 11/18/2018   HDL 35 (L) 11/18/2018   LDLCALC 73 11/18/2018   TRIG 164 (H) 11/18/2018   CHOLHDL 3.9 11/18/2018         ASSESSMENT / PLAN / RECOMMENDATIONS:   1) Type 2 Diabetes Mellitus, Poorly controlled, With ESRD on HD, retinopathic and neuropathic  complications - Most recent A1c of 9.8 %. Goal A1c < 7.0 %.    - I have discussed with the patient the pathophysiology of diabetes. We went over the  natural progression of the disease. We talked about both insulin resistance and insulin deficiency. We stressed the importance of lifestyle changes including diet and exercise. I explained the complications associated with diabetes including retinopathy, nephropathy, neuropathy as well as increased risk of cardiovascular disease. We went over the benefit seen with glycemic control.  I explained to the patient that diabetic patients are at higher than normal risk for amputations.   - Pt does not have  Eating pattern, it seems she eats small meals and tends to guess on prandial dosing , pt states " I take what I think is enough for the meal" hence the pt ends up with fluctauating BG's ranging from severe hypoglycemia and hyperglycemia.  - I have suggested carb counting as a better way in dosing prandial insulin, pt will be referred to our RD for further training on this. She will be provided with a correction scale to curb guessing.  MEDICATIONS:  Continue Basaglar 26 units daily   Novolig: I:C ratio of 1:15  CF: Novolog ( BG-120/45)  EDUCATION / INSTRUCTIONS:  BG monitoring instructions: Patient is instructed to check her blood sugars 4 times a day, before meals and bedtime   Call [place] [facility] clinic if: BG persistently < 70 or > 300. . I reviewed the Rule of 15 for the treatment of hypoglycemia in detail with the patient. Literature supplied.    2) Diabetic complications:   Eye: Does  have known diabetic retinopathy.   Neuro/ Feet: Does  have known diabetic peripheral neuropathy .   Renal: Patient does  have known baseline ESRD on HD.   3) Lipids: Patient is on atorvastatin    F/U in 3 months    Signed electronically by: Mack Guise, MD  [facility] Endocrinology  [facility] [address] [place], [place] [address] Phone: [phone] FAX: [phone]   CC: Ladell Pier, MD [place] [address] Phone:  [phone]  Fax: [phone]  Return to Endocrinology clinic as below: Future Appointments  Date Time Provider [place]  09/05/2019 11:10 AM Chloris Marcoux, Melanie Crazier, MD [id number] None

## 2019-10-27 ENCOUNTER — Other Ambulatory Visit: Payer: Self-pay

## 2019-10-27 ENCOUNTER — Encounter: Payer: Medicare Other | Attending: Internal Medicine | Admitting: Dietician

## 2019-10-27 ENCOUNTER — Encounter: Payer: Self-pay | Admitting: Dietician

## 2019-10-27 DIAGNOSIS — Z992 Dependence on renal dialysis: Secondary | ICD-10-CM | POA: Insufficient documentation

## 2019-10-27 DIAGNOSIS — E1165 Type 2 diabetes mellitus with hyperglycemia: Secondary | ICD-10-CM | POA: Insufficient documentation

## 2019-10-27 DIAGNOSIS — IMO0002 Reserved for concepts with insufficient information to code with codable children: Secondary | ICD-10-CM

## 2019-10-27 DIAGNOSIS — E1139 Type 2 diabetes mellitus with other diabetic ophthalmic complication: Secondary | ICD-10-CM | POA: Diagnosis not present

## 2019-10-27 DIAGNOSIS — N186 End stage renal disease: Secondary | ICD-10-CM | POA: Diagnosis present

## 2019-10-27 NOTE — Progress Notes (Signed)
Diabetes Self-Management Education  Visit Type: First/Initial  Appt. Start Time: 1545 Appt. End Time: 1700  10/27/2019  [name], identified by name and date of birth, is a 59 y.o. female with a diagnosis of Diabetes: Type 2.   ASSESSMENT Patient is here today alone.  She would like to learn how to carbohydrate count and utilize her insulin chart given to her by Dr. Kelton Pillar. She did not know how to carbohydrate count and had just been giving enough Novolog based on her sliding scale.  Taught carb counting today and was able to demonstrate carb counting and the proper amount of insulin to take based on her ratio as well as how to add insulin based on her sliding scale.  Discussed that these may changed based on her appointment next week.  She sees an RD monthly at the dialysis center. She was on a kidney transplant list but removed herself from this as she does not have a support system and does not have a car to get to her appointments in [facility].  History includes Type 2 Diabetes with nephropathy, HTN, dyslipidemia.  Peritoneal dialysis daily since August 08, 2019. A1C 9.8% 05/06/2019 Medications include Basaglar 26, Novolog 1:15 insulin to carbohydrate ratio and per sliding scale. She has considered a Continuous Glucose Monitor but did not previously meet the criteria due to not checking her blood sugar often enough. The CGM's are now covered by Medicare and Medicaid and no longer require mandatory 4 finger checks.  Weight hx: 150 lbs 10/2019 150 lbs dry weight >180 lbs 2 years ago.  Lost weight last year prior to dialysis.  Patient lives alone.   She is on disability.  She does not have a car and relies on [employer] or the city bus. She states that last year she thought she was going to die.  She was on Hemodialysis and did not feel well.  She was missing appointments.  She was not taking care of herself as she felt so bad. Since changing to peritoneal dialysis, she states  that she is doing much better and has not missed a day.  Eating still remains erratic.  She usually does not cook and eats easy things. Recently purchased a stationary bike. Loves fruit but upsets her stomach.  Usually tolerates canned fruit.  Weight 150 lb (68 kg), last menstrual period 03/13/2002. Body mass index is 29.29 kg/m.   Diabetes Self-Management Education - 10/27/19 1601      Visit Information   Visit Type First/Initial      Initial Visit   Diabetes Type Type 2    Are you currently following a meal plan? No    Are you taking your medications as prescribed? Yes    Date Diagnosed 1980"s      Health Coping   How would you rate your overall health? Fair      Psychosocial Assessment   Patient Belief/Attitude about Diabetes Motivated to manage diabetes    Self-care barriers Lack of material resources;Lack of transportation    Other persons present Patient    Patient Concerns Other (comment)   carbohydrate counting   Special Needs None    Preferred Learning Style No preference indicated    Learning Readiness Ready    How often do you need to have someone help you when you read instructions, pamphlets, or other written materials from your doctor or pharmacy? 1 - Never    What is the last grade level you completed in school? 2  years college      Pre-Education Assessment   Patient understands the diabetes disease and treatment process. Needs Review    Patient understands incorporating nutritional management into lifestyle. Needs Review    Patient undertands incorporating physical activity into lifestyle. Needs Review    Patient understands using medications safely. Needs Review    Patient understands monitoring blood glucose, interpreting and using results Needs Review    Patient understands prevention, detection, and treatment of acute complications. Needs Review    Patient understands prevention, detection, and treatment of chronic complications. Needs Review    Patient  understands how to develop strategies to address psychosocial issues. Needs Review    Patient understands how to develop strategies to promote health/change behavior. Needs Review      Complications   Last HgB A1C per patient/outside source 11 %   per patient 08/2019 increased from 9.8% 05/06/2019   How often do you check your blood sugar? 1-2 times/day    Fasting Blood glucose range (mg/dL) >200    Have you had a dilated eye exam in the past 12 months? No    Have you had a dental exam in the past 12 months? No    Are you checking your feet? Yes    How many days per week are you checking your feet? 7      Dietary Intake   Breakfast [place] bacon or [place] sausage, boiled or fried egg, toast OR occasional instant oatmeal OR two small Jimmie Dean Sausage biscuits    Dinner steak, mustard, ketchup, 2 slices white bread, rare broccoli    Snack (evening) occasional apple, chips    Beverage(s) water, diet soda (clear), unsweetened tea      Exercise   Exercise Type Light (walking / raking leaves)    How many days per week to you exercise? 2    How many minutes per day do you exercise? 30    Total minutes per week of exercise 60      Patient Education   Previous Diabetes Education Yes (please comment)   monthly with dialysis RD, 2020 with RD here   Nutrition management  Meal options for control of blood glucose level and chronic complications.;Carbohydrate counting    Physical activity and exercise  Identified with patient nutritional and/or medication changes necessary with exercise.    Medications Reviewed patients medication for diabetes, action, purpose, timing of dose and side effects.;Other (comment)   insulin to carb ratio and sliding scale   Acute complications Taught treatment of hypoglycemia - the 15 rule.    Psychosocial adjustment Worked with patient to identify barriers to care and solutions;Role of stress on diabetes;Identified and addressed patients feelings and concerns about  diabetes;Brainstormed with patient on coping mechanisms for social situations, getting support from significant others, dealing with feelings about diabetes      Post-Education Assessment   Patient understands the diabetes disease and treatment process. Demonstrates understanding / competency    Patient understands incorporating nutritional management into lifestyle. Needs Review    Patient undertands incorporating physical activity into lifestyle. Demonstrates understanding / competency    Patient understands using medications safely. Needs Review    Patient understands monitoring blood glucose, interpreting and using results Demonstrates understanding / competency    Patient understands prevention, detection, and treatment of acute complications. Demonstrates understanding / competency    Patient understands prevention, detection, and treatment of chronic complications. Demonstrates understanding / competency    Patient understands how to develop strategies to address  psychosocial issues. Needs Review    Patient understands how to develop strategies to promote health/change behavior. Needs Review      Outcomes   Expected Outcomes Demonstrated interest in learning. Expect positive outcomes    Future DMSE 2 months    Program Status Not Completed           Individualized Plan for Diabetes Self-Management Training:   Learning Objective:  Patient will have a greater understanding of diabetes self-management. Patient education plan is to attend individual and/or group sessions per assessed needs and concerns.   Plan:   Patient Instructions  Plan: Unfortified Almond milk would be a good choice. You may have 1/2 cup of regular milk or 1 slice of cheese daily.  Ask Dr. Kelton Pillar about the Continuous Glucose Monitor that you prefer. Please check with Dr. Kelton Pillar at your next appointment regarding your Insulin to Carbohydrate ratio.  Your last insulin to carbohydrate ratio is 1 unit  of insulin per 15 grams of carbohydrate.  (Divide the total carbohydrate by 15 to determine the number of units of insulin.  Always round down.  Use your sliding scale to determine how much insulin to take based on your blood sugar.  Add the insulin together to get the total to inject prior to each meal.   Avoid skipping meals Continue to take your blood sugar as prescribed. Continue to check your blood sugar regularly.  Aim for at least 2 times daily but more is better as you are on multiple insulin injections. "Sheet pan meal"  Focus on self care.   Expected Outcomes:  Demonstrated interest in learning. Expect positive outcomes  Education material provided: Food label handouts and Meal plan card, Dexcom brochure, [employer].   If problems or questions, patient to contact team via:  Phone  Future DSME appointment: 2 months

## 2019-10-27 NOTE — Patient Instructions (Addendum)
Plan: Unfortified Almond milk would be a good choice. You may have 1/2 cup of regular milk or 1 slice of cheese daily.  Ask Dr. Kelton Pillar about the Continuous Glucose Monitor that you prefer. Please check with Dr. Kelton Pillar at your next appointment regarding your Insulin to Carbohydrate ratio.  Your last insulin to carbohydrate ratio is 1 unit of insulin per 15 grams of carbohydrate.  (Divide the total carbohydrate by 15 to determine the number of units of insulin.  Always round down.  Use your sliding scale to determine how much insulin to take based on your blood sugar.  Add the insulin together to get the total to inject prior to each meal.   Avoid skipping meals Continue to take your blood sugar as prescribed. Continue to check your blood sugar regularly.  Aim for at least 2 times daily but more is better as you are on multiple insulin injections. "Sheet pan meal"  Focus on self care.

## 2019-11-02 ENCOUNTER — Ambulatory Visit: Payer: Medicare Other | Admitting: Internal Medicine

## 2019-11-02 NOTE — Progress Notes (Deleted)
Name: Ann Bryan  Age/ Sex: 59 y.o., female   MRN/ DOB: [id number], 09/24/1960     PCP: Ladell Pier, MD   Reason for Endocrinology Evaluation: Type 2 Diabetes Mellitus  Initial Endocrine Consultative Visit: 02/01/2018    PATIENT IDENTIFIER: Ann Bryan is a 59 y.o. female with a past medical history of T2DM, HTN and Dyslipidemia. The patient has followed with Endocrinology clinic since 02/01/2018 for consultative assistance with management of her diabetes.  DIABETIC HISTORY:  Ann Bryan was diagnosed with DM in the 1980's,she was initially on metformin only but 10 yrs after diagnosis , insulin was started. She also has been on Januvia and Victoza in the past.  Her hemoglobin A1c has ranged from 7.8% in 2021, peaking at 13.3% in 2016.   SUBJECTIVE:   During the last visit (06/01/2019): A1c 9.8% Continued MDI regimen   Today (11/02/2019): Ann Bryan is here for a follow up on diabetes management.  She checks her blood sugars 1 times daily.. The patient has had hypoglycemic episodes since the last clinic visit, which typically occur 3 x / month- most often occuring during variable times, no pattern . The patient is not symptomatic with these episodes.   She eats 1 meals , snacks occasionally .  She has pruritus   Tue, Thursday and Saturday  Lives alone   HOME DIABETES REGIMEN:  Basaglar 26 units daily  Novolog 1:15  CF: Novolog (BG-120/45)     Statin: yes ACE-I/ARB: no    METER DOWNLOAD SUMMARY: Date range evaluated: 4/21-5/20/2021 Fingerstick Blood Glucose Tests = 27 Average Number Tests/Day = 0.9 Overall Mean FS Glucose = 242 Standard Deviation = 186  BG Ranges: Low = 38 High = High    Hypoglycemic Events/30 Days: BG < 50 = 2 Episodes of symptomatic severe hypoglycemia = 0    DIABETIC COMPLICATIONS: Microvascular complications:   ESRD on HD, retinopathy (s/p laser) , neuropathy   Last Eye Exam: Completed years ago   Macrovascular  complications:   Denies: CAD, CVA, PVD   HISTORY:  Past Medical History:  Past Medical History:  Diagnosis Date  . Anemia   . Cataract    right eye  . Complication of anesthesia     " I woke up itching from anesthesia many years ago."   . Depression    at times  . Diabetes mellitus 1986    poorly controlled last hemoglobin A1c  10.5 on January 01, 2010, increase since June 2011 when hemoglobin A1c was 9.3 , determined to be most likely secondary to non-compliance.  . Diabetic nephropathy ([place])   . Diabetic retinopathy   . Dyspnea    "when I have too much fluid"  . End stage renal disease on dialysis [facility])    T/Th/Sat [address]  . Glaucoma   . History of blood transfusion   . Hyperlipidemia   . Hypertension   . Nausea and vomiting   . Neuropathy   . Retinopathy   . Wears glasses    Past Surgical History:  Past Surgical History:  Procedure Laterality Date  . ABDOMINAL HYSTERECTOMY   2004  . AV FISTULA PLACEMENT Left 01/21/2019   Procedure: BRACHIOCEPHALIC ARTERIOVENOUS (AV) FISTULA CREATION LEFT ARM;  Surgeon: Serafina Mitchell, MD;  Location: [place];  Service: Vascular;  Laterality: Left;  . EYE SURGERY    . FISTULA SUPERFICIALIZATION Left 05/20/2019   Procedure: FISTULA SUPERFICIALIZATION LEFT PROXIMAL FISTULA;  Surgeon: Serafina Mitchell, MD;  Location:  [facility];  Service: Vascular;  Laterality: Left;  . IR FLUORO GUIDE CV LINE RIGHT  11/15/2018  . IR US GUIDE VASC ACCESS RIGHT  11/15/2018  . MYOMECTOMY  1994  . REFRACTIVE SURGERY Bilateral     Social History:  reports that she has never smoked. She has never used smokeless tobacco. She reports that she does not drink alcohol and does not use drugs. Family History:  Family History  Problem Relation Age of Onset  . Diabetes Mother   . Stroke Mother   . Hypertension Mother   . Alcohol abuse Mother   . Depression Father        Committed suicide  . Alcohol abuse Maternal Uncle      HOME MEDICATIONS: Allergies as  of 11/02/2019      Reactions   Biopatch Protective Disk-chg [chlorhexidine]    States rash with use of CHG bath   Doxycycline Nausea And Vomiting   Victoza [liraglutide] Nausea And Vomiting      Medication List       Accurate as of November 02, 2019  7:13 AM. If you have any questions, ask your nurse or doctor.        amLODipine 10 MG tablet Commonly known as: NORVASC Take 1 tablet by mouth daily.   atorvastatin 20 MG tablet Commonly known as: LIPITOR Take 1 tablet by mouth daily.   B-D UF III MINI PEN NEEDLES 31G X 5 MM Misc Generic drug: Insulin Pen Needle 1 each by Does not apply route 4 (four) times daily. E11.9   Basaglar KwikPen 100 UNIT/ML Inject 0.26 mLs (26 Units total) into the skin at bedtime. Taking 26 units at hs   calcium acetate 667 MG capsule Commonly known as: PHOSLO Take 667-2,001 mg by mouth See admin instructions. 2,001 mg with meals 3 times daily and 667 mg 2 times daily with snacks   carvedilol 25 MG tablet Commonly known as: COREG Take 1 and 1/2 tablets by mouth twice daily with a meal.   cinacalcet 60 MG tablet Commonly known as: SENSIPAR Take 60 mg by mouth daily.   Contour Next Test test strip Generic drug: glucose blood 1 each by Other route 4 (four) times daily. E11.65   INSULIN SYRINGE 1CC/31GX5/16" 31G X 5/16" 1 ML Misc Use as directed.  BD 2m x 31G needles   Insulin Syringes (Disposable) U-100 0.5 ML Misc 1 each by Does not apply route 2 (two) times daily.   lisinopril 40 MG tablet Commonly known as: ZESTRIL Take 40 mg by mouth daily.   methocarbamol 500 MG tablet Commonly known as: ROBAXIN Take 500 mg by mouth 2 (two) times daily as needed for muscle spasms.   multivitamin Tabs tablet Take 1 tablet by mouth daily.   NovoLOG FlexPen 100 UNIT/ML FlexPen Generic drug: insulin aspart INJECT 10-14 UNITS INTO THE SKIN THREE TIMES DAILY WITH MEALS.   True Metrix Meter w/Device Kit 1 each by Does not apply route as needed.     TRUEplus Lancets 28G Misc 1 each by Does not apply route 3 (three) times daily.   zolpidem 5 MG tablet Commonly known as: AMBIEN Take 1 tablet (5 mg total) by mouth at bedtime as needed. Each prescription to last a month.        OBJECTIVE:   Vital Signs: LMP 03/13/2002   Wt Readings from Last 3 Encounters:  10/27/19 150 lb (68 kg)  06/01/19 155 lb 12.8 oz (70.7 kg)  05/30/19 156 lb (70.8 kg)  Exam: General: Pt appears well and is in NAD  Neck: General: Supple without adenopathy. Thyroid: Thyroid size normal.  No goiter or nodules appreciated. No thyroid bruit.  Lungs: Clear with good BS bilat with no rales, rhonchi, or wheezes  Heart: RRR with normal S1 and S2 and no gallops; no murmurs; no rub  Abdomen: Normoactive bowel sounds, soft, nontender, without masses or organomegaly palpable  Extremities: No pretibial edema.  Neuro: MS is good with appropriate affect, pt is alert and Ox3             DATA REVIEWED:  Lab Results  Component Value Date   HGBA1C 9.8 (H) 05/06/2019   HGBA1C 7.8 (H) 01/21/2019   HGBA1C 7.3 (A) 11/18/2018   Lab Results  Component Value Date   MICROALBUR 58.6 (H) 10/20/2014   LDLCALC 73 11/18/2018   CREATININE 8.75 (H) 05/20/2019   Lab Results  Component Value Date   MICRALBCREAT 4,081 (H) 11/18/2018     Lab Results  Component Value Date   CHOL 136 11/18/2018   HDL 35 (L) 11/18/2018   LDLCALC 73 11/18/2018   TRIG 164 (H) 11/18/2018   CHOLHDL 3.9 11/18/2018         ASSESSMENT / PLAN / RECOMMENDATIONS:   1) Type 2 Diabetes Mellitus, Poorly controlled, With ESRD on HD, retinopathic and neuropathic  complications - Most recent A1c of 9.8 %. Goal A1c < 7.0 %.    - I have discussed with the patient the pathophysiology of diabetes. We went over the natural progression of the disease. We talked about both insulin resistance and insulin deficiency. We stressed the importance of lifestyle changes including diet and exercise. I  explained the complications associated with diabetes including retinopathy, nephropathy, neuropathy as well as increased risk of cardiovascular disease. We went over the benefit seen with glycemic control.  I explained to the patient that diabetic patients are at higher than normal risk for amputations.   - Pt does not have  Eating pattern, it seems she eats small meals and tends to guess on prandial dosing , pt states " I take what I think is enough for the meal" hence the pt ends up with fluctauating BG's ranging from severe hypoglycemia and hyperglycemia.  - I have suggested carb counting as a better way in dosing prandial insulin, pt will be referred to our RD for further training on this. She will be provided with a correction scale to curb guessing.  MEDICATIONS:  Continue Basaglar 26 units daily   Novolig: I:C ratio of 1:15  CF: Novolog ( BG-120/45)  EDUCATION / INSTRUCTIONS:  BG monitoring instructions: Patient is instructed to check her blood sugars 4 times a day, before meals and bedtime   Call [facility] if: BG persistently < 70 or > 300. . I reviewed the Rule of 15 for the treatment of hypoglycemia in detail with the patient. Literature supplied.  REFERRALS:  RD   2) Diabetic complications:   Eye: Does  have known diabetic retinopathy.   Neuro/ Feet: Does  have known diabetic peripheral neuropathy .   Renal: Patient does  have known baseline ESRD on HD.   3) Lipids: Patient is on atorvastatin    F/U in 3 months    Signed electronically by: Mack Guise, MD  [facility]  [facility] [address] [place],  [address] Phone: [phone] FAX: [phone]   CC: Ladell Pier, MD [place]  [address] Phone: [phone]  Fax: [phone]  Return to Endocrinology clinic as below: Future Appointments  Date Time Provider [place]  11/02/2019 10:50 AM  Davion Flannery, Melanie Crazier, MD [facility] None  01/09/2020  2:00 PM Jobe, Barnabas Lister, RD [place] [facility]

## 2019-11-24 ENCOUNTER — Encounter: Payer: Self-pay | Admitting: Internal Medicine

## 2019-11-24 ENCOUNTER — Other Ambulatory Visit: Payer: Self-pay

## 2019-11-24 ENCOUNTER — Ambulatory Visit (INDEPENDENT_AMBULATORY_CARE_PROVIDER_SITE_OTHER): Payer: Medicare Other | Admitting: Internal Medicine

## 2019-11-24 VITALS — BP 160/80 | HR 82 | Ht 60.0 in | Wt 145.0 lb

## 2019-11-24 DIAGNOSIS — E113599 Type 2 diabetes mellitus with proliferative diabetic retinopathy without macular edema, unspecified eye: Secondary | ICD-10-CM

## 2019-11-24 DIAGNOSIS — E1142 Type 2 diabetes mellitus with diabetic polyneuropathy: Secondary | ICD-10-CM | POA: Diagnosis not present

## 2019-11-24 DIAGNOSIS — Z992 Dependence on renal dialysis: Secondary | ICD-10-CM

## 2019-11-24 DIAGNOSIS — E1165 Type 2 diabetes mellitus with hyperglycemia: Secondary | ICD-10-CM | POA: Diagnosis not present

## 2019-11-24 DIAGNOSIS — E1122 Type 2 diabetes mellitus with diabetic chronic kidney disease: Secondary | ICD-10-CM | POA: Diagnosis not present

## 2019-11-24 DIAGNOSIS — N186 End stage renal disease: Secondary | ICD-10-CM

## 2019-11-24 DIAGNOSIS — Z794 Long term (current) use of insulin: Secondary | ICD-10-CM

## 2019-11-24 LAB — POCT GLYCOSYLATED HEMOGLOBIN (HGB A1C): Hemoglobin A1C: 10.3 % — AB (ref 4.0–5.6)

## 2019-11-24 NOTE — Progress Notes (Signed)
Name: Ann Bryan  Age/ Sex: 59 y.o., female   MRN/ DOB: [id number], 08-Oct-1960     PCP: Ladell Pier, MD   Reason for Endocrinology Evaluation: Type 2 Diabetes Mellitus  Initial Endocrine Consultative Visit: 02/01/2018    PATIENT IDENTIFIER: Ann Bryan is a 59 y.o. female with a past medical history of T2DM, HTN and Dyslipidemia. The patient has followed with Endocrinology clinic since 02/01/2018 for consultative assistance with management of her diabetes.  DIABETIC HISTORY:  [name] was diagnosed with DM in the 1980's,she was initially on metformin only, but 10 yrs after diagnosis , insulin was started. She also has been on Januvia and Victoza in the past.  Her hemoglobin A1c has ranged from 7.8% in 2021, peaking at 13.3% in 2016.   SUBJECTIVE:   During the last visit (06/01/2019): A1c 9.8% Continued MDI regimen     Today (11/24/2019): [name] is here for a follow up on diabetes management.  She checks her blood sugars 2 times daily.The patient has had hypoglycemic episodes since the last clinic visit, which typically occur 1 x / month- most often occuring in the morning .   She is having rash and pruritus from HD Started  PD 08/2019   She eats 1 meals , snacks occasionally .    HOME DIABETES REGIMEN:  Basaglar 26 units daily  Novolog 1:15   CF: Novolog (BG-120/45)     Statin: yes ACE-I/ARB: no    METER DOWNLOAD SUMMARY: Date range evaluated: 11/12-11/18/2021 Average Number Tests/Day = 0.7 Overall Mean FS Glucose = 172 Standard Deviation = 140  BG Ranges: Low = 64 High = 414   Hypoglycemic Events/30 Days: BG < 50 = 2 Episodes of symptomatic severe hypoglycemia = 0    DIABETIC COMPLICATIONS: Microvascular complications:  ESRD on HD, retinopathy (s/p laser) , neuropathy  Last Eye Exam: Completed years ago   Macrovascular complications:  Denies: CAD, CVA, PVD   HISTORY:  Past Medical History:  Past Medical History:  Diagnosis  Date  . Anemia   . Cataract    right eye  . Complication of anesthesia     " I woke up itching from anesthesia many years ago."   . Depression    at times  . Diabetes mellitus 1986    poorly controlled last hemoglobin A1c  10.5 on January 01, 2010, increase since June 2011 when hemoglobin A1c was 9.3 , determined to be most likely secondary to non-compliance.  . Diabetic nephropathy ([place])   . Diabetic retinopathy   . Dyspnea    "when I have too much fluid"  . End stage renal disease on dialysis [facility])    T/Th/Sat [address]  . Glaucoma   . History of blood transfusion   . Hyperlipidemia   . Hypertension   . Nausea and vomiting   . Neuropathy   . Retinopathy   . Wears glasses    Past Surgical History:  Past Surgical History:  Procedure Laterality Date  . ABDOMINAL HYSTERECTOMY   2004  . AV FISTULA PLACEMENT Left 01/21/2019   Procedure: BRACHIOCEPHALIC ARTERIOVENOUS (AV) FISTULA CREATION LEFT ARM;  Surgeon: Serafina Mitchell, MD;  Location: [place];  Service: Vascular;  Laterality: Left;  . EYE SURGERY    . FISTULA SUPERFICIALIZATION Left 05/20/2019   Procedure: FISTULA SUPERFICIALIZATION LEFT PROXIMAL FISTULA;  Surgeon: Serafina Mitchell, MD;  [facility];  Service: Vascular;  Laterality: Left;  . IR FLUORO GUIDE CV LINE RIGHT  11/15/2018  . IR US GUIDE VASC ACCESS RIGHT  11/15/2018  . MYOMECTOMY  1994  . REFRACTIVE SURGERY Bilateral    Social History:  reports that she has never smoked. She has never used smokeless tobacco. She reports that she does not drink alcohol and does not use drugs. Family History:  Family History  Problem Relation Age of Onset  . Diabetes Mother   . Stroke Mother   . Hypertension Mother   . Alcohol abuse Mother   . Depression Father        Committed suicide  . Alcohol abuse Maternal Uncle      HOME MEDICATIONS: Allergies as of 11/24/2019      Reactions   Biopatch Protective Disk-chg [chlorhexidine]    States rash with use of CHG bath    Doxycycline Nausea And Vomiting   Victoza [liraglutide] Nausea And Vomiting      Medication List       Accurate as of November 24, 2019 11:06 AM. If you have any questions, ask your nurse or doctor.        STOP taking these medications   lisinopril 40 MG tablet Commonly known as: ZESTRIL Stopped by: Dorita Sciara, MD     TAKE these medications   amLODipine 10 MG tablet Commonly known as: NORVASC Take 1 tablet by mouth daily.   atorvastatin 20 MG tablet Commonly known as: LIPITOR Take 1 tablet by mouth daily.   B-D UF III MINI PEN NEEDLES 31G X 5 MM Misc Generic drug: Insulin Pen Needle 1 each by Does not apply route 4 (four) times daily. E11.9   Basaglar KwikPen 100 UNIT/ML Inject 0.26 mLs (26 Units total) into the skin at bedtime. Taking 26 units at hs   calcium acetate 667 MG capsule Commonly known as: PHOSLO Take 667-2,001 mg by mouth See admin instructions. 2,001 mg with meals 3 times daily and 667 mg 2 times daily with snacks   carvedilol 25 MG tablet Commonly known as: COREG Take 1 and 1/2 tablets by mouth twice daily with a meal.   cinacalcet 60 MG tablet Commonly known as: SENSIPAR Take 60 mg by mouth daily.   Contour Next Test test strip Generic drug: glucose blood 1 each by Other route 4 (four) times daily. E11.65   INSULIN SYRINGE 1CC/31GX5/16" 31G X 5/16" 1 ML Misc Use as directed.  BD 27m x 31G needles   Insulin Syringes (Disposable) U-100 0.5 ML Misc 1 each by Does not apply route 2 (two) times daily.   methocarbamol 500 MG tablet Commonly known as: ROBAXIN Take 500 mg by mouth 2 (two) times daily as needed for muscle spasms.   multivitamin Tabs tablet Take 1 tablet by mouth daily.   NovoLOG FlexPen 100 UNIT/ML FlexPen Generic drug: insulin aspart INJECT 10-14 UNITS INTO THE SKIN THREE TIMES DAILY WITH MEALS.   True Metrix Meter w/Device Kit 1 each by Does not apply route as needed.   TRUEplus Lancets 28G Misc 1 each by Does  not apply route 3 (three) times daily.   zolpidem 5 MG tablet Commonly known as: AMBIEN Take 1 tablet (5 mg total) by mouth at bedtime as needed. Each prescription to last a month.        OBJECTIVE:   Vital Signs: BP (!) 160/80   Pulse 82   Ht 5' (1.524 m)   Wt 145 lb (65.8 kg)   LMP 03/13/2002   SpO2 98%   BMI 28.32 kg/m   Wt Readings  from Last 3 Encounters:  11/24/19 145 lb (65.8 kg)  10/27/19 150 lb (68 kg)  06/01/19 155 lb 12.8 oz (70.7 kg)     Exam: General: Pt appears well and is in NAD  Lungs: Clear with good BS bilat with no rales, rhonchi, or wheezes  Heart: RRR with normal S1 and S2 and no gallops; no murmurs; no rub  Extremities: No pretibial edema.  Patient with multiple dark macules scattered around her legs.  Neuro: MS is good with appropriate affect, pt is alert and Ox3             DATA REVIEWED:  Lab Results  Component Value Date   HGBA1C 10.3 (A) 11/24/2019   HGBA1C 9.8 (H) 05/06/2019   HGBA1C 7.8 (H) 01/21/2019   Lab Results  Component Value Date   MICROALBUR 58.6 (H) 10/20/2014   LDLCALC 73 11/18/2018   CREATININE 8.75 (H) 05/20/2019   Lab Results  Component Value Date   MICRALBCREAT 4,081 (H) 11/18/2018     Lab Results  Component Value Date   CHOL 136 11/18/2018   HDL 35 (L) 11/18/2018   LDLCALC 73 11/18/2018   TRIG 164 (H) 11/18/2018   CHOLHDL 3.9 11/18/2018         ASSESSMENT / PLAN / RECOMMENDATIONS:   1) Type 2 Diabetes Mellitus, Poorly controlled, With ESRD on PD, , retinopathic and neuropathic  complications - Most recent A1c of 10.3 %. Goal A1c < 7.0 %.    -Patient admits to imperfect adherence to medications as well as dietary indiscretions, she has been trying to do better for the past 2 weeks, she has been much more motivated recently. -We did discuss insulin pump and CGM technology which she is interested in -The patient is currently on peritoneal dialysis, her BG's in the morning have been fluctuating between  64 and 400 mg/DL.  -People on peritoneal dialysis and persistent morning hyperglycemia may benefit from adding an NPH, But this has not been the case with Ms. Breshears as her BG's fluctuate from 64-1 37 to 400 mg/DL -I am going to refer her to our CDE for proper insulin pump education   MEDICATIONS: Continue Basaglar 26 units daily  Novolig: I:C ratio of 1:15 CF: Novolog ( BG-120/35)  EDUCATION / INSTRUCTIONS: BG monitoring instructions: Patient is instructed to check her blood sugars 4 times a day, before meals and bedtime  Call [place] [facility] clinic if: BG persistently < 70  I reviewed the Rule of 15 for the treatment of hypoglycemia in detail with the patient. Literature supplied.     2) Diabetic complications:  Eye: Does  have known diabetic retinopathy.  Neuro/ Feet: Does  have known diabetic peripheral neuropathy .  Renal: Patient does  have known baseline ESRD on HD.   3) Lipids: Patient is on atorvastatin    F/U in 3 months    Signed electronically by: Mack Guise, MD  [facility] Endocrinology  [facility] [address] [place], [place] [address] Phone: [phone] FAX: [phone]   CC: Ladell Pier, MD [place] [address] Phone: [phone]  Fax: [phone]  Return to Endocrinology clinic as below: Future Appointments  Date Time Provider [place]  01/09/2020  2:00 PM Valora Piccolo Barnabas Lister, RD [place] [facility]

## 2019-11-24 NOTE — Patient Instructions (Addendum)
-   Continue Basaglar at 26 units daily  - Novolog 1 unit for every 15 grams of carbohydrate   Novolog correctional insulin: ADD extra units on insulin to your meal-time Novolog dose if your blood sugars are higher than 165. Use the scale below to help guide you:   Blood sugar before meal Number of units to inject  Less than  165 0 unit   166 -  210 1 units   211 -  245 2 units  246 -  280 3 units  281 -  315 4 units  316 -  350 5 units  351 -  385 6 units  386 -  420 7 units  421 -  455 8 units  456- 490 9 units  491- 525 10 units  526- 560 11 units             HOW TO TREAT LOW BLOOD SUGARS (Blood sugar LESS THAN 70 MG/DL)  Please follow the RULE OF 15 for the treatment of hypoglycemia treatment (when your (blood sugars are less than 70 mg/dL)    STEP 1: Take 15 grams of carbohydrates when your blood sugar is low, which includes:   3-4 GLUCOSE TABS  OR  3-4 OZ OF JUICE OR REGULAR SODA OR  ONE TUBE OF GLUCOSE GEL     STEP 2: RECHECK blood sugar in 15 MINUTES STEP 3: If your blood sugar is still low at the 15 minute recheck --> then, go back to STEP 1 and treat AGAIN with another 15 grams of carbohydrates.

## 2019-11-26 ENCOUNTER — Other Ambulatory Visit: Payer: Self-pay | Admitting: Internal Medicine

## 2019-11-26 DIAGNOSIS — E1169 Type 2 diabetes mellitus with other specified complication: Secondary | ICD-10-CM

## 2019-11-26 DIAGNOSIS — I10 Essential (primary) hypertension: Secondary | ICD-10-CM

## 2019-11-26 DIAGNOSIS — E785 Hyperlipidemia, unspecified: Secondary | ICD-10-CM

## 2019-12-26 ENCOUNTER — Other Ambulatory Visit: Payer: Self-pay | Admitting: Internal Medicine

## 2019-12-26 DIAGNOSIS — I10 Essential (primary) hypertension: Secondary | ICD-10-CM

## 2019-12-26 NOTE — Telephone Encounter (Signed)
Requested medication (s) are due for refill today: yes  Requested medication (s) are on the active medication list: yes  Last refill: 12/23/2019  Future visit scheduled: no  Notes to clinic:  message left for patient to call and schedule appt  Overdue for appt   Requested Prescriptions  Pending Prescriptions Disp Refills   carvedilol (COREG) 25 MG tablet [Pharmacy Med Name: Carvedilol 25mg  Tablet] 90 tablet 0    Sig: Take 1 and 1/2 tablets by mouth twice daily with a meal.      Cardiovascular:  Beta Blockers Failed - 12/26/2019  6:09 AM      Failed - Last BP in normal range    BP Readings from Last 1 Encounters:  11/24/19 (!) 160/80          Failed - Valid encounter within last 6 months    Recent Outpatient Visits           6 months ago Hospital discharge follow-up   [place], Deborah B, MD   7 months ago Uncontrolled type 2 diabetes mellitus with nephropathy [facility])   [place] Ladell Pier, MD   1 year ago Uncontrolled type 2 diabetes mellitus with nephropathy [facility])   [place] Ladell Pier, MD   1 year ago Uncontrolled type 2 diabetes mellitus with nephropathy [facility])   [facility] Ladell Pier, MD   1 year ago Uncontrolled type 2 diabetes mellitus with nephropathy [facility])   [place], MD                Passed - Last Heart Rate in normal range    Pulse Readings from Last 1 Encounters:  11/24/19 82            amLODipine (NORVASC) 10 MG tablet [Pharmacy Med Name: Amlodipine Besylate 10mg  Tablet] 30 tablet 0    Sig: Take 1 tablet by mouth daily.      Cardiovascular:  Calcium Channel Blockers Failed - 12/26/2019  6:09 AM      Failed - Last BP in normal range    BP Readings from Last 1 Encounters:  11/24/19 (!) 160/80          Failed - Valid encounter within  last 6 months    Recent Outpatient Visits           6 months ago Hospital discharge follow-up   [place] Karle Plumber B, MD   7 months ago Uncontrolled type 2 diabetes mellitus with nephropathy [facility])   [facility] Ladell Pier, MD   1 year ago Uncontrolled type 2 diabetes mellitus with nephropathy [facility])   [facility] Ladell Pier, MD   1 year ago Uncontrolled type 2 diabetes mellitus with nephropathy [facility])   [facility] Ladell Pier, MD   1 year ago Uncontrolled type 2 diabetes mellitus with nephropathy [facility])   [place] [facility] Ladell Pier, MD

## 2020-01-02 ENCOUNTER — Telehealth: Payer: Self-pay | Admitting: Internal Medicine

## 2020-01-02 DIAGNOSIS — I10 Essential (primary) hypertension: Secondary | ICD-10-CM

## 2020-01-02 NOTE — Telephone Encounter (Signed)
[employer] called saying they need more instructions for the Amlodipine   CB#  [phone]

## 2020-01-02 NOTE — Telephone Encounter (Signed)
Please review request below. Prescription for Amlodipine 10 mg tablet does not have instructions on how to take the medication listed. Refill was sent to pharmacy on 12/26/19 #30.

## 2020-01-03 MED ORDER — AMLODIPINE BESYLATE 10 MG PO TABS: 10.0000 mg | ORAL_TABLET | Freq: Every day | ORAL | 0 refills | Status: AC

## 2020-01-03 NOTE — Telephone Encounter (Signed)
Done

## 2020-01-03 NOTE — Telephone Encounter (Signed)
Please see below message and advice!Thank you.

## 2020-01-05 NOTE — Telephone Encounter (Signed)
Called pt made aware °

## 2020-01-09 ENCOUNTER — Encounter: Payer: Medicare Other | Attending: Internal Medicine | Admitting: Dietician

## 2020-01-09 DIAGNOSIS — Z992 Dependence on renal dialysis: Secondary | ICD-10-CM | POA: Insufficient documentation

## 2020-01-09 DIAGNOSIS — E1165 Type 2 diabetes mellitus with hyperglycemia: Secondary | ICD-10-CM | POA: Insufficient documentation

## 2020-01-09 DIAGNOSIS — N186 End stage renal disease: Secondary | ICD-10-CM | POA: Insufficient documentation

## 2020-01-09 DIAGNOSIS — E1139 Type 2 diabetes mellitus with other diabetic ophthalmic complication: Secondary | ICD-10-CM | POA: Insufficient documentation

## 2020-01-25 ENCOUNTER — Other Ambulatory Visit: Payer: Self-pay | Admitting: Internal Medicine

## 2020-01-25 ENCOUNTER — Other Ambulatory Visit: Payer: Self-pay | Admitting: Family Medicine

## 2020-01-25 ENCOUNTER — Encounter: Payer: Self-pay | Admitting: *Deleted

## 2020-01-25 DIAGNOSIS — I10 Essential (primary) hypertension: Secondary | ICD-10-CM

## 2020-02-24 ENCOUNTER — Other Ambulatory Visit: Payer: Self-pay | Admitting: Internal Medicine

## 2020-02-24 DIAGNOSIS — E785 Hyperlipidemia, unspecified: Secondary | ICD-10-CM

## 2020-02-24 DIAGNOSIS — E1169 Type 2 diabetes mellitus with other specified complication: Secondary | ICD-10-CM

## 2020-02-24 NOTE — Telephone Encounter (Signed)
   Notes to clinic Has already had a curtesy refill and does not have an upcoming appt scheduled. Please assess, labs are over 360 days as well.

## 2020-02-27 ENCOUNTER — Other Ambulatory Visit: Payer: Self-pay

## 2020-02-29 ENCOUNTER — Ambulatory Visit (INDEPENDENT_AMBULATORY_CARE_PROVIDER_SITE_OTHER): Payer: Medicare Other | Admitting: Internal Medicine

## 2020-02-29 ENCOUNTER — Other Ambulatory Visit: Payer: Self-pay

## 2020-02-29 VITALS — BP 172/60 | HR 90 | Ht 60.0 in | Wt 136.5 lb

## 2020-02-29 DIAGNOSIS — Z794 Long term (current) use of insulin: Secondary | ICD-10-CM | POA: Diagnosis not present

## 2020-02-29 DIAGNOSIS — E1122 Type 2 diabetes mellitus with diabetic chronic kidney disease: Secondary | ICD-10-CM | POA: Diagnosis not present

## 2020-02-29 DIAGNOSIS — E1165 Type 2 diabetes mellitus with hyperglycemia: Secondary | ICD-10-CM | POA: Diagnosis not present

## 2020-02-29 DIAGNOSIS — Z992 Dependence on renal dialysis: Secondary | ICD-10-CM

## 2020-02-29 DIAGNOSIS — N186 End stage renal disease: Secondary | ICD-10-CM | POA: Diagnosis not present

## 2020-02-29 LAB — BASIC METABOLIC PANEL
BUN: 64 mg/dL — ABNORMAL HIGH (ref 6–23)
CO2: 30 mEq/L (ref 19–32)
Calcium: 9.2 mg/dL (ref 8.4–10.5)
Chloride: 97 mEq/L (ref 96–112)
Creatinine, Ser: 13.88 mg/dL (ref 0.40–1.20)
GFR: 2.65 mL/min — CL (ref >60.00)
Glucose, Bld: 228 mg/dL — ABNORMAL HIGH (ref 70–99)
Potassium: 3.9 mEq/L (ref 3.5–5.1)
Sodium: 138 mEq/L (ref 135–145)

## 2020-02-29 LAB — POCT GLUCOSE (DEVICE FOR HOME USE)
POC Glucose: 400 mg/dl — AB (ref 70–99)
POC Glucose: 220 mg/dl — AB (ref 70–99)
POC Glucose: 230 mg/dl — AB (ref 70–99)

## 2020-02-29 LAB — POCT GLYCOSYLATED HEMOGLOBIN (HGB A1C): Hemoglobin A1C: 13.9 % — AB (ref 4.0–5.6)

## 2020-02-29 MED ORDER — DEXCOM G6 TRANSMITTER MISC: 1.0000 | 3 refills | Status: AC

## 2020-02-29 MED ORDER — INSULIN LISPRO 100 UNIT/ML ~~LOC~~ SOLN
6.0000 [IU] | Freq: Once | SUBCUTANEOUS | Status: DC
Start: 2020-02-29 — End: 2020-08-31

## 2020-02-29 MED ORDER — ONETOUCH VERIO VI STRP: 1.0000 | ORAL_STRIP | Freq: Three times a day (TID) | 12 refills | Status: DC

## 2020-02-29 MED ORDER — NOVOLOG FLEXPEN 100 UNIT/ML ~~LOC~~ SOPN: PEN_INJECTOR | SUBCUTANEOUS | 3 refills | Status: DC

## 2020-02-29 MED ORDER — DEXCOM G6 SENSOR MISC: 1.0000 | 3 refills | Status: AC

## 2020-02-29 NOTE — Progress Notes (Signed)
Name: Ann Bryan  Age/ Sex: 60 y.o., female   MRN/ DOB: [id number], 1960/04/16     PCP: Ladell Pier, MD   Reason for Endocrinology Evaluation: Type 2 Diabetes Mellitus  Initial Endocrine Consultative Visit: 02/01/2018    PATIENT IDENTIFIER: Ann Bryan is a 60 y.o. female with a past medical history of T2DM, HTN and Dyslipidemia. The patient has followed with Endocrinology clinic since 02/01/2018 for consultative assistance with management of her diabetes.  DIABETIC HISTORY:  Ms. Carstens was diagnosed with DM in the 1980's,she was initially on metformin only, but 10 yrs after diagnosis , insulin was started. She also has been on Januvia and Victoza in the past.  Her hemoglobin A1c has ranged from 7.8% in 2021, peaking at 13.3% in 2016.   Peritoneal dialysis started 08/2019  SUBJECTIVE:   During the last visit (11/24/2019): A1c 10.3 % Continued MDI regimen     Today (02/29/2020): [name] is here for a follow up on diabetes management.  She checks her blood sugars 0 times daily. She went to visit family over the holidays and forgot her meter there. She admits to not taking insulin consistently   She has not seen our RD , she no showed for the appointment to discuss pump options 01/2020  Continues on  PD that was started 08/2019   She feels week, nauseous and vomited this AM. In-Office Bg was 400 mg/dL    HOME DIABETES REGIMEN:  Basaglar 26 units daily  Novolog 1:15   CF: Novolog (BG-120/45)     Statin: yes ACE-I/ARB: no    METER DOWNLOAD SUMMARY: did not bring    DIABETIC COMPLICATIONS: Microvascular complications:  ESRD on PD, retinopathy (s/p laser) , neuropathy  Last Eye Exam: Completed years ago   Macrovascular complications:  Denies: CAD, CVA, PVD   HISTORY:  Past Medical History:  Past Medical History:  Diagnosis Date   Anemia    Cataract    right eye   Complication of anesthesia     " I woke up itching from anesthesia many years  ago."    Depression    at times   Diabetes mellitus 1986    poorly controlled last hemoglobin A1c  10.5 on January 01, 2010, increase since June 2011 when hemoglobin A1c was 9.3 , determined to be most likely secondary to non-compliance.   Diabetic nephropathy ([facility])    Diabetic retinopathy    Dyspnea    "when I have too much fluid"   End stage renal disease on dialysis ([place])    T/Th/Sat [address]   Glaucoma    History of blood transfusion    Hyperlipidemia    Hypertension    Nausea and vomiting    Neuropathy    Retinopathy    Wears glasses    Past Surgical History:  Past Surgical History:  Procedure Laterality Date   ABDOMINAL HYSTERECTOMY   2004   AV FISTULA PLACEMENT Left 01/21/2019   Procedure: BRACHIOCEPHALIC ARTERIOVENOUS (AV) FISTULA CREATION LEFT ARM;  Surgeon: Serafina Mitchell, MD;  Location: [place];  Service: Vascular;  Laterality: Left;   EYE SURGERY     FISTULA SUPERFICIALIZATION Left 05/20/2019   Procedure: FISTULA SUPERFICIALIZATION LEFT PROXIMAL FISTULA;  Surgeon: Serafina Mitchell, MD;  Location: [facility];  Service: Vascular;  Laterality: Left;   IR FLUORO GUIDE CV LINE RIGHT  11/15/2018   IR US GUIDE VASC ACCESS RIGHT  11/15/2018   MYOMECTOMY  1994   REFRACTIVE  SURGERY Bilateral    Social History:  reports that she has never smoked. She has never used smokeless tobacco. She reports that she does not drink alcohol and does not use drugs. Family History:  Family History  Problem Relation Age of Onset   Diabetes Mother    Stroke Mother    Hypertension Mother    Alcohol abuse Mother    Depression Father        Committed suicide   Alcohol abuse Maternal Uncle      HOME MEDICATIONS: Allergies as of 02/29/2020      Reactions   Biopatch Protective Disk-chg [chlorhexidine]    States rash with use of CHG bath   Doxycycline Nausea And Vomiting   Victoza [liraglutide] Nausea And Vomiting      Medication List       Accurate as of  February 29, 2020  1:30 PM. If you have any questions, ask your nurse or doctor.        amLODipine 10 MG tablet Commonly known as: NORVASC Take 1 tablet (10 mg total) by mouth daily. Must have office visit for refills   atorvastatin 20 MG tablet Commonly known as: LIPITOR Take 1 tablet by mouth daily.   B-D UF III MINI PEN NEEDLES 31G X 5 MM Misc Generic drug: Insulin Pen Needle 1 each by Does not apply route 4 (four) times daily. E11.9   Basaglar KwikPen 100 UNIT/ML Inject 0.26 mLs (26 Units total) into the skin at bedtime. Taking 26 units at hs   calcium acetate 667 MG capsule Commonly known as: PHOSLO Take 667-2,001 mg by mouth See admin instructions. 2,001 mg with meals 3 times daily and 667 mg 2 times daily with snacks   carvedilol 25 MG tablet Commonly known as: COREG Take 1 and 1/2 tablets by mouth twice daily with a meal. Must have office visit for refills   cinacalcet 60 MG tablet Commonly known as: SENSIPAR Take 60 mg by mouth daily.   Contour Next Test test strip Generic drug: glucose blood 1 each by Other route 4 (four) times daily. E11.65   Dexcom G6 Sensor Misc 1 Device by Does not apply route as directed. Started by: Dorita Sciara, MD   Dexcom G6 Transmitter Misc 1 Device by Does not apply route as directed. Started by: Dorita Sciara, MD   INSULIN SYRINGE 1CC/31GX5/16" 31G X 5/16" 1 ML Misc Use as directed.  BD 52m x 31G needles   Insulin Syringes (Disposable) U-100 0.5 ML Misc 1 each by Does not apply route 2 (two) times daily.   methocarbamol 500 MG tablet Commonly known as: ROBAXIN Take 500 mg by mouth 2 (two) times daily as needed for muscle spasms.   multivitamin Tabs tablet Take 1 tablet by mouth daily.   NovoLOG FlexPen 100 UNIT/ML FlexPen Generic drug: insulin aspart Max daily 40 units What changed: additional instructions Changed by: IDorita Sciara MD   True Metrix Meter w/Device Kit 1 each by Does not apply  route as needed.   TRUEplus Lancets 28G Misc 1 each by Does not apply route 3 (three) times daily.   zolpidem 5 MG tablet Commonly known as: AMBIEN Take 1 tablet (5 mg total) by mouth at bedtime as needed. Each prescription to last a month.        OBJECTIVE:   Vital Signs: BP (!) 172/60    Pulse 90    Ht 5' (1.524 m)    Wt 136 lb 8 oz (61.9  kg)    LMP 03/13/2002    SpO2 98%    BMI 26.66 kg/m   Wt Readings from Last 3 Encounters:  02/29/20 136 lb 8 oz (61.9 kg)  11/24/19 145 lb (65.8 kg)  10/27/19 150 lb (68 kg)     Exam: General: Pt appears well and is in NAD  Lungs: Clear with good BS bilat with no rales, rhonchi, or wheezes  Heart: RRR with normal S1 and S2 and no gallops; no murmurs; no rub  Extremities: No pretibial edema.  Patient with multiple dark macules scattered around her legs.  Neuro: MS is good with appropriate affect, pt is alert and Ox3             DATA REVIEWED:  Lab Results  Component Value Date   HGBA1C 13.9 (A) 02/29/2020   HGBA1C 10.3 (A) 11/24/2019   HGBA1C 9.8 (H) 05/06/2019   Lab Results  Component Value Date   MICROALBUR 58.6 (H) 10/20/2014   LDLCALC 73 11/18/2018   CREATININE 8.75 (H) 05/20/2019   Lab Results  Component Value Date   MICRALBCREAT 4,081 (H) 11/18/2018     Lab Results  Component Value Date   CHOL 136 11/18/2018   HDL 35 (L) 11/18/2018   LDLCALC 73 11/18/2018   TRIG 164 (H) 11/18/2018   CHOLHDL 3.9 11/18/2018         ASSESSMENT / PLAN / RECOMMENDATIONS:   1) Type 2 Diabetes Mellitus, Poorly controlled, With ESRD on PD, , retinopathic and neuropathic  complications - Most recent A1c of 13.9 %. Goal A1c < 7.0 %.    - Pt continues with hyperglycemia, unfortunately she has not been checking glucose nor taking prandial insulin and questionable basal insulin intake  - She missed her appointment with our RD for insulin pump discussion, will refer again  - Will prescribed Dexcom  - With a BG of 400 mg/dL  associated with weakness nausea and vomiting, she was advised to repor to the ED, pt declined at first but then opted to walk herself to the ED as she does not have transportation and declined calling the ambulance stating its too costly. We have opted to keep her in the office and she was given 6 units of Humalog and provided with water to drink periodically and was monitored for TWO hours, until her glucose decreased to 230 mg/dL and remained steady.   - We again discussed the importance of taking insulin regulalry and using the correction scale as it works.  - I am going to give her a standing dose of prandial insulin as she does not count carbs   MEDICATIONS: Continue Basaglar 26 units daily  Novolog 4 units  CF: Novolog ( BG-120/35)  EDUCATION / INSTRUCTIONS: BG monitoring instructions: Patient is instructed to check her blood sugars 4 times a day, before meals and bedtime  Call [place] [facility] clinic if: BG persistently < 70  I reviewed the Rule of 15 for the treatment of hypoglycemia in detail with the patient. Literature supplied.     2) Diabetic complications:  Eye: Does have known diabetic retinopathy.  Neuro/ Feet: Does  have known diabetic peripheral neuropathy .  Renal: Patient does  have known baseline ESRD on HD.    The patient was kept in the office and observed for at least 120 minutes until her glucose decreased from 400 to 230 mg/dL.     F/U in 3 months    Signed electronically by: Mack Guise, MD  [place]  Endocrinology  [facility] [address] [place], [place] [address] Phone: [phone] FAX: [phone]   CC: Ladell Pier, MD [place] [address] Phone: [phone]  Fax: [phone]  Return to Endocrinology clinic as below: No future appointments.

## 2020-02-29 NOTE — Patient Instructions (Addendum)
-   Continue Basaglar 26 units daily  - Novolog 4 units with each meal    Novolog correctional insulin: ADD extra units on insulin to your meal-time Novolog dose if your blood sugars are higher than 165. Use the scale below to help guide you:   Blood sugar before meal Number of units to inject  Less than  165 0 unit   166 -  210 1 units   211 -  245 2 units  246 -  280 3 units  281 -  315 4 units  316 -  350 5 units  351 -  385 6 units  386 -  420 7 units  421 -  455 8 units  456- 490 9 units  491- 525 10 units  526- 560 11 units             HOW TO TREAT LOW BLOOD SUGARS (Blood sugar LESS THAN 70 MG/DL)  Please follow the RULE OF 15 for the treatment of hypoglycemia treatment (when your (blood sugars are less than 70 mg/dL)    STEP 1: Take 15 grams of carbohydrates when your blood sugar is low, which includes:   3-4 GLUCOSE TABS  OR  3-4 OZ OF JUICE OR REGULAR SODA OR  ONE TUBE OF GLUCOSE GEL     STEP 2: RECHECK blood sugar in 15 MINUTES STEP 3: If your blood sugar is still low at the 15 minute recheck --> then, go back to STEP 1 and treat AGAIN with another 15 grams of carbohydrates.

## 2020-03-01 ENCOUNTER — Telehealth: Payer: Self-pay | Admitting: Internal Medicine

## 2020-03-01 ENCOUNTER — Encounter: Payer: Self-pay | Admitting: Internal Medicine

## 2020-03-01 MED ORDER — ONETOUCH VERIO VI STRP: 1.0000 | ORAL_STRIP | Freq: Three times a day (TID) | 12 refills | Status: DC

## 2020-03-01 NOTE — Telephone Encounter (Signed)
Sent test strips to [facility] as requested.

## 2020-03-01 NOTE — Telephone Encounter (Signed)
Patient requests the RX for glucose blood (ONETOUCH VERIO) test strip that was sent to [facility] to be sent to a different PHARM as follows [facility] does not carry test strips):  [facility]

## 2020-03-23 ENCOUNTER — Other Ambulatory Visit: Payer: Self-pay | Admitting: Internal Medicine

## 2020-03-23 MED ORDER — ONETOUCH VERIO VI STRP: ORAL_STRIP | 5 refills | Status: AC

## 2020-03-23 NOTE — Telephone Encounter (Signed)
Send new Rx with Dx as requested.

## 2020-03-29 ENCOUNTER — Other Ambulatory Visit: Payer: Self-pay | Admitting: Internal Medicine

## 2020-04-03 ENCOUNTER — Other Ambulatory Visit: Payer: Self-pay | Admitting: Internal Medicine

## 2020-04-10 ENCOUNTER — Encounter: Payer: Medicare Other | Attending: Internal Medicine | Admitting: Nutrition

## 2020-04-10 ENCOUNTER — Other Ambulatory Visit: Payer: Self-pay

## 2020-04-10 ENCOUNTER — Telehealth: Payer: Self-pay | Admitting: Internal Medicine

## 2020-04-10 DIAGNOSIS — IMO0002 Reserved for concepts with insufficient information to code with codable children: Secondary | ICD-10-CM

## 2020-04-10 DIAGNOSIS — E1165 Type 2 diabetes mellitus with hyperglycemia: Secondary | ICD-10-CM | POA: Insufficient documentation

## 2020-04-10 DIAGNOSIS — E1139 Type 2 diabetes mellitus with other diabetic ophthalmic complication: Secondary | ICD-10-CM | POA: Diagnosis not present

## 2020-04-10 NOTE — Telephone Encounter (Signed)
Noted Faxed as requested

## 2020-04-10 NOTE — Telephone Encounter (Signed)
Santiago Glad from [facility] called asking if we could send the last chart note from patient's last visit. She said she faxed a request for it. Its for the Dexcom G6 items - they just needed the chart note in order for patient to get those items.  Fax to# [phone]

## 2020-04-11 NOTE — Patient Instructions (Signed)
Call me when you get your pods and PDM, to set up an appointment for training

## 2020-04-11 NOTE — Progress Notes (Signed)
Patient is here today to learn about insulin pump therapy.  We discussed how pumps work, and what is needed to wear a pump sucessfully. She reports that she is family with the idea of carb counting, but needs a refresher on this.  She was shown the different pumps and she chose the Dash for it's simplicity/ ease of use.  Paperwork was filled out, but patient did not bring her Medicaid card, and said will add it to my chart when she gets home.  Prescription for PDM and pods put on Dr, [facility] desk.   She is also wanting to go on the Dexcom but is too expensive to use at this time.  She is hoping that when the new dash comes out, it will be covered under the pump script.  I told her that I am not certain of this, at this time, but will research this for her.

## 2020-05-01 ENCOUNTER — Telehealth: Payer: Self-pay | Admitting: Nutrition

## 2020-05-02 NOTE — Telephone Encounter (Signed)
Appointment scheduled for Monday for pump start.  To stop long acting insulin Sunday night.

## 2020-05-07 ENCOUNTER — Encounter: Payer: Medicare Other | Admitting: Nutrition

## 2020-05-21 ENCOUNTER — Encounter: Payer: Medicare Other | Admitting: Nutrition

## 2020-05-22 ENCOUNTER — Telehealth: Payer: Self-pay | Admitting: Nutrition

## 2020-05-22 NOTE — Telephone Encounter (Signed)
LVM that she missed her pump start appointment with me yesterday.  Told to call me to reschedule

## 2020-05-30 ENCOUNTER — Ambulatory Visit: Payer: Medicare Other | Admitting: Internal Medicine

## 2020-05-30 NOTE — Progress Notes (Deleted)
Name: Ann Bryan  Age/ Sex: 60 y.o., female   MRN/ DOB: [id number], 1960/10/16     PCP: Ladell Pier, MD   Reason for Endocrinology Evaluation: Type 2 Diabetes Mellitus  Initial Endocrine Consultative Visit: 02/01/2018    PATIENT IDENTIFIER: Ann Bryan is a 60 y.o. female with a past medical history of T2DM, HTN and Dyslipidemia. The patient has followed with Endocrinology clinic since 02/01/2018 for consultative assistance with management of her diabetes.  DIABETIC HISTORY:  Ms. Hinde was diagnosed with DM in the 1980's,she was initially on metformin only, but 10 yrs after diagnosis , insulin was started. She also has been on Januvia and Victoza in the past.  Her hemoglobin A1c has ranged from 7.8% in 2021, peaking at 13.3% in 2016.   Peritoneal dialysis started 08/2019  SUBJECTIVE:   During the last visit (11/24/2019): A1c 10.3 % Continued MDI regimen     Today (05/30/2020): [name] is here for a follow up on diabetes management.  She checks her blood sugars 0 times daily. She went to visit family over the holidays and forgot her meter there. She admits to not taking insulin consistently   She has not seen our RD , she no showed for the appointment to discuss pump options 01/2020  Continues on  PD that was started 08/2019   She feels week, nauseous and vomited this AM. In-Office Bg was 400 mg/dL    HOME DIABETES REGIMEN:  Basaglar 26 units daily  Novolog 1:15   CF: Novolog (BG-120/45)     Statin: yes ACE-I/ARB: no    METER DOWNLOAD SUMMARY: did not bring    DIABETIC COMPLICATIONS: Microvascular complications:   ESRD on PD, retinopathy (s/p laser) , neuropathy   Last Eye Exam: Completed years ago   Macrovascular complications:   Denies: CAD, CVA, PVD   HISTORY:  Past Medical History:  Past Medical History:  Diagnosis Date  . Anemia   . Cataract    right eye  . Complication of anesthesia     " I woke up itching from anesthesia many  years ago."   . Depression    at times  . Diabetes mellitus 1986    poorly controlled last hemoglobin A1c  10.5 on January 01, 2010, increase since June 2011 when hemoglobin A1c was 9.3 , determined to be most likely secondary to non-compliance.  . Diabetic nephropathy ([place])   . Diabetic retinopathy   . Dyspnea    "when I have too much fluid"  . End stage renal disease on dialysis [facility])    T/Th/Sat [address]  . Glaucoma   . History of blood transfusion   . Hyperlipidemia   . Hypertension   . Nausea and vomiting   . Neuropathy   . Retinopathy   . Wears glasses    Past Surgical History:  Past Surgical History:  Procedure Laterality Date  . ABDOMINAL HYSTERECTOMY   2004  . AV FISTULA PLACEMENT Left 01/21/2019   Procedure: BRACHIOCEPHALIC ARTERIOVENOUS (AV) FISTULA CREATION LEFT ARM;  Surgeon: Serafina Mitchell, MD;  Location: [place];  Service: Vascular;  Laterality: Left;  . EYE SURGERY    . FISTULA SUPERFICIALIZATION Left 05/20/2019   Procedure: FISTULA SUPERFICIALIZATION LEFT PROXIMAL FISTULA;  Surgeon: Serafina Mitchell, MD;  Location: [place];  Service: Vascular;  Laterality: Left;  . IR FLUORO GUIDE CV LINE RIGHT  11/15/2018  . IR US GUIDE VASC ACCESS RIGHT  11/15/2018  . MYOMECTOMY  1994  .  REFRACTIVE SURGERY Bilateral     Social History:  reports that she has never smoked. She has never used smokeless tobacco. She reports that she does not drink alcohol and does not use drugs. Family History:  Family History  Problem Relation Age of Onset  . Diabetes Mother   . Stroke Mother   . Hypertension Mother   . Alcohol abuse Mother   . Depression Father        Committed suicide  . Alcohol abuse Maternal Uncle      HOME MEDICATIONS: Allergies as of 05/30/2020      Reactions   Biopatch Protective Disk-chg [chlorhexidine]    States rash with use of CHG bath   Doxycycline Nausea And Vomiting   Victoza [liraglutide] Nausea And Vomiting      Medication List       Accurate  as of May 30, 2020  7:39 AM. If you have any questions, ask your nurse or doctor.        amLODipine 10 MG tablet Commonly known as: NORVASC Take 1 tablet (10 mg total) by mouth daily. Must have office visit for refills   atorvastatin 20 MG tablet Commonly known as: LIPITOR Take 1 tablet by mouth daily.   B-D UF III MINI PEN NEEDLES 31G X 5 MM Misc Generic drug: Insulin Pen Needle 1 each by Does not apply route 4 (four) times daily. E11.9   Basaglar KwikPen 100 UNIT/ML Inject 0.26 mLs (26 Units total) into the skin at bedtime. Taking 26 units at hs   calcium acetate 667 MG capsule Commonly known as: PHOSLO Take 667-2,001 mg by mouth See admin instructions. 2,001 mg with meals 3 times daily and 667 mg 2 times daily with snacks   carvedilol 25 MG tablet Commonly known as: COREG Take 1 and 1/2 tablets by mouth twice daily with a meal. Must have office visit for refills   cinacalcet 60 MG tablet Commonly known as: SENSIPAR Take 60 mg by mouth daily.   Dexcom G6 Receiver Devi Use as instructed to check blood sugar daily   Dexcom G6 Sensor Misc 1 Device by Does not apply route as directed.   Dexcom G6 Transmitter Misc 1 Device by Does not apply route as directed.   Insulin Aspart FlexPen 100 UNIT/ML Sopn Inject 4 units into skin three times daily with meals. Max 40 units with sliding scale.   INSULIN SYRINGE 1CC/31GX5/16" 31G X 5/16" 1 ML Misc Use as directed.  BD 61m x 31G needles   Insulin Syringes (Disposable) U-100 0.5 ML Misc 1 each by Does not apply route 2 (two) times daily.   methocarbamol 500 MG tablet Commonly known as: ROBAXIN Take 500 mg by mouth 2 (two) times daily as needed for muscle spasms.   multivitamin Tabs tablet Take 1 tablet by mouth daily.   OneTouch Verio test strip Generic drug: glucose blood Use as instructed to check blood sugar 3 times daily Dx is E11.65   True Metrix Meter w/Device Kit 1 each by Does not apply route as needed.    TRUEplus Lancets 28G Misc 1 each by Does not apply route 3 (three) times daily.   zolpidem 5 MG tablet Commonly known as: AMBIEN Take 1 tablet (5 mg total) by mouth at bedtime as needed. Each prescription to last a month.        OBJECTIVE:   Vital Signs: LMP 03/13/2002   Wt Readings from Last 3 Encounters:  02/29/20 136 lb 8 oz (61.9 kg)  11/24/19 145 lb (65.8 kg)  10/27/19 150 lb (68 kg)     Exam: General: Pt appears well and is in NAD  Lungs: Clear with good BS bilat with no rales, rhonchi, or wheezes  Heart: RRR with normal S1 and S2 and no gallops; no murmurs; no rub  Extremities: No pretibial edema.  Patient with multiple dark macules scattered around her legs.  Neuro: MS is good with appropriate affect, pt is alert and Ox3             DATA REVIEWED:  Lab Results  Component Value Date   HGBA1C 13.9 (A) 02/29/2020   HGBA1C 10.3 (A) 11/24/2019   HGBA1C 9.8 (H) 05/06/2019   Lab Results  Component Value Date   MICROALBUR 58.6 (H) 10/20/2014   LDLCALC 73 11/18/2018   CREATININE 13.88 (HH) 02/29/2020   Lab Results  Component Value Date   MICRALBCREAT 4,081 (H) 11/18/2018     Lab Results  Component Value Date   CHOL 136 11/18/2018   HDL 35 (L) 11/18/2018   LDLCALC 73 11/18/2018   TRIG 164 (H) 11/18/2018   CHOLHDL 3.9 11/18/2018         ASSESSMENT / PLAN / RECOMMENDATIONS:   1) Type 2 Diabetes Mellitus, Poorly controlled, With ESRD on PD, , retinopathic and neuropathic  complications - Most recent A1c of 13.9 %. Goal A1c < 7.0 %.    - Pt continues with hyperglycemia, unfortunately she has not been checking glucose nor taking prandial insulin and questionable basal insulin intake  - She missed her appointment with our RD for insulin pump discussion, will refer again  - Will prescribed Dexcom  - With a BG of 400 mg/dL associated with weakness nausea and vomiting, she was advised to repor to the ED, pt declined at first but then opted to walk herself  to the ED as she does not have transportation and declined calling the ambulance stating its too costly. We have opted to keep her in the office and she was given 6 units of Humalog and provided with water to drink periodically and was monitored for TWO hours, until her glucose decreased to 230 mg/dL and remained steady.   - We again discussed the importance of taking insulin regulalry and using the correction scale as it works.  - I am going to give her a standing dose of prandial insulin as she does not count carbs   MEDICATIONS:  Continue Basaglar 26 units daily   Novolog 4 units   CF: Novolog ( BG-120/35)  EDUCATION / INSTRUCTIONS:  BG monitoring instructions: Patient is instructed to check her blood sugars 4 times a day, before meals and bedtime   Call [facility] if: BG persistently < 70  . I reviewed the Rule of 15 for the treatment of hypoglycemia in detail with the patient. Literature supplied.     2) Diabetic complications:   Eye: Does have known diabetic retinopathy.   Neuro/ Feet: Does  have known diabetic peripheral neuropathy .   Renal: Patient does  have known baseline ESRD on HD.     F/U in 3 months    Signed electronically by: Mack Guise, MD  [facility]  [facility] [address] [place], [place] [address] Phone: [phone] FAX: [phone]   CC: Ladell Pier, MD [place] [address] Phone: [phone]  Fax: [phone]  Return to Endocrinology clinic as below: Future Appointments  Date Time Provider  [place]  05/30/2020 10:30 AM Christie Viscomi, Melanie Crazier, MD [facility] None

## 2020-08-01 ENCOUNTER — Telehealth: Payer: Self-pay | Admitting: Nutrition

## 2020-08-01 NOTE — Telephone Encounter (Signed)
Message left on my machine last week that she received her pump and is ready for training. Called her and scheduled pump and dexcom training for 8/15

## 2020-08-20 ENCOUNTER — Emergency Department (HOSPITAL_COMMUNITY)
Admission: EM | Admit: 2020-08-20 | Discharge: 2020-08-20 | Disposition: A | Discharge status: Home / Self Care | Payer: Medicare Other | Attending: Emergency Medicine | Admitting: Emergency Medicine

## 2020-08-20 ENCOUNTER — Emergency Department (HOSPITAL_COMMUNITY): Payer: Medicare Other

## 2020-08-20 ENCOUNTER — Encounter: Payer: Medicare Other | Attending: Internal Medicine | Admitting: Nutrition

## 2020-08-20 ENCOUNTER — Other Ambulatory Visit: Payer: Self-pay

## 2020-08-20 ENCOUNTER — Telehealth: Payer: Self-pay | Admitting: Nutrition

## 2020-08-20 DIAGNOSIS — E1142 Type 2 diabetes mellitus with diabetic polyneuropathy: Secondary | ICD-10-CM | POA: Insufficient documentation

## 2020-08-20 DIAGNOSIS — Z79899 Other long term (current) drug therapy: Secondary | ICD-10-CM | POA: Diagnosis not present

## 2020-08-20 DIAGNOSIS — E1122 Type 2 diabetes mellitus with diabetic chronic kidney disease: Secondary | ICD-10-CM | POA: Diagnosis not present

## 2020-08-20 DIAGNOSIS — E1165 Type 2 diabetes mellitus with hyperglycemia: Secondary | ICD-10-CM | POA: Insufficient documentation

## 2020-08-20 DIAGNOSIS — I120 Hypertensive chronic kidney disease with stage 5 chronic kidney disease or end stage renal disease: Secondary | ICD-10-CM | POA: Insufficient documentation

## 2020-08-20 DIAGNOSIS — Z992 Dependence on renal dialysis: Secondary | ICD-10-CM | POA: Insufficient documentation

## 2020-08-20 DIAGNOSIS — R739 Hyperglycemia, unspecified: Secondary | ICD-10-CM | POA: Diagnosis present

## 2020-08-20 DIAGNOSIS — Z20822 Contact with and (suspected) exposure to covid-19: Secondary | ICD-10-CM | POA: Insufficient documentation

## 2020-08-20 DIAGNOSIS — N186 End stage renal disease: Secondary | ICD-10-CM | POA: Insufficient documentation

## 2020-08-20 DIAGNOSIS — Z794 Long term (current) use of insulin: Secondary | ICD-10-CM | POA: Insufficient documentation

## 2020-08-20 DIAGNOSIS — N19 Unspecified kidney failure: Secondary | ICD-10-CM

## 2020-08-20 LAB — CBC WITH DIFFERENTIAL/PLATELET
Abs Immature Granulocytes: 0.02 10*3/uL (ref 0.00–0.07)
Basophils Absolute: 0 10*3/uL (ref 0.0–0.1)
Basophils Relative: 0 %
Eosinophils Absolute: 0.2 10*3/uL (ref 0.0–0.5)
Eosinophils Relative: 3 %
HCT: 37 % (ref 36.0–46.0)
Hemoglobin: 11.9 g/dL — ABNORMAL LOW (ref 12.0–15.0)
Immature Granulocytes: 0 %
Lymphocytes Relative: 14 %
Lymphs Abs: 0.9 10*3/uL (ref 0.7–4.0)
MCH: 27.4 pg (ref 26.0–34.0)
MCHC: 32.2 g/dL (ref 30.0–36.0)
MCV: 85.3 fL (ref 80.0–100.0)
Monocytes Absolute: 0.4 10*3/uL (ref 0.1–1.0)
Monocytes Relative: 7 %
Neutro Abs: 4.8 10*3/uL (ref 1.7–7.7)
Neutrophils Relative %: 76 %
Platelets: 222 10*3/uL (ref 150–400)
RBC: 4.34 MIL/uL (ref 3.87–5.11)
RDW: 14.6 % (ref 11.5–15.5)
WBC: 6.3 10*3/uL (ref 4.0–10.5)
nRBC: 0 % (ref 0.0–0.2)

## 2020-08-20 LAB — RESP PANEL BY RT-PCR (FLU A&B, COVID) ARPGX2
Influenza A by PCR: NEGATIVE
Influenza B by PCR: NEGATIVE
SARS Coronavirus 2 by RT PCR: NEGATIVE

## 2020-08-20 LAB — CBG MONITORING, ED
Glucose-Capillary: 350 mg/dL — ABNORMAL HIGH (ref 70–99)
Glucose-Capillary: 64 mg/dL — ABNORMAL LOW (ref 70–99)
Glucose-Capillary: 86 mg/dL (ref 70–99)
Glucose-Capillary: 110 mg/dL — ABNORMAL HIGH (ref 70–99)

## 2020-08-20 LAB — COMPREHENSIVE METABOLIC PANEL
ALT: 39 U/L (ref 0–44)
AST: 16 U/L (ref 15–41)
Albumin: 2.6 g/dL — ABNORMAL LOW (ref 3.5–5.0)
Alkaline Phosphatase: 223 U/L — ABNORMAL HIGH (ref 38–126)
Anion gap: 18 — ABNORMAL HIGH (ref 5–15)
BUN: 89 mg/dL — ABNORMAL HIGH (ref 6–20)
CO2: 21 mmol/L — ABNORMAL LOW (ref 22–32)
Calcium: 8.7 mg/dL — ABNORMAL LOW (ref 8.9–10.3)
Chloride: 94 mmol/L — ABNORMAL LOW (ref 98–111)
Creatinine, Ser: 14.41 mg/dL — ABNORMAL HIGH (ref 0.44–1.00)
GFR, Estimated: 3 mL/min — ABNORMAL LOW (ref >60)
Glucose, Bld: 275 mg/dL — ABNORMAL HIGH (ref 70–99)
Potassium: 4.1 mmol/L (ref 3.5–5.1)
Sodium: 133 mmol/L — ABNORMAL LOW (ref 135–145)
Total Bilirubin: 0.5 mg/dL (ref 0.3–1.2)
Total Protein: 4.9 g/dL — ABNORMAL LOW (ref 6.5–8.1)

## 2020-08-20 MED ORDER — LACTATED RINGERS IV BOLUS: 2000.0000 mL | Freq: Once | INTRAVENOUS | Status: DC

## 2020-08-20 MED ORDER — MAGNESIUM OXIDE -MG SUPPLEMENT 400 (240 MG) MG PO TABS
800.0000 mg | ORAL_TABLET | Freq: Once | ORAL | Status: AC
  Administered 2020-08-20: 800 mg via ORAL
  Filled 2020-08-20: qty 2

## 2020-08-20 MED ORDER — INSULIN ASPART 100 UNIT/ML IJ SOLN
5.0000 [IU] | Freq: Once | INTRAMUSCULAR | Status: AC
  Administered 2020-08-20: 5 [IU] via INTRAVENOUS

## 2020-08-20 MED ORDER — LACTATED RINGERS IV BOLUS
1000.0000 mL | Freq: Once | INTRAVENOUS | Status: AC
  Administered 2020-08-20: 1000 mL via INTRAVENOUS

## 2020-08-20 NOTE — Telephone Encounter (Signed)
Message left on machine at 6:30PM and again at 7:45PM to see how she was doing on her pump. Reminded her again to stop the Basaglar and call the help line if questions about her pump. Also reminded her of her appointment with me tomorrow at 12 noon. Tried her cell phone as well, but not able to leave a message.

## 2020-08-20 NOTE — ED Notes (Signed)
Pt called out for low blood sugar. A CBG was checked and read 64. Dr. Tyrone Nine and Cort[place], RN aware of this low blood sugar. Pt was given orange juice and graham crackers. She was more lethargic and sleepy. Will continue to monitor blood sugar.

## 2020-08-20 NOTE — ED Provider Notes (Signed)
[facility] EMERGENCY DEPARTMENT Provider Note   CSN: [id number] Arrival date & time: 08/20/20  1531     History Chief Complaint  Patient presents with   Hyperglycemia   Hypertension    Ann Bryan is a 60 y.o. female.  60 yo F with a chief complaints of hyperglycemia.  Went to see her diabetic nutritionist today and was found to have a high blood sugar reading.  She refused to come to the hospital to be evaluated and at the clinic they were able to change the settings on her insulin pump and bring her blood sugar down gradually.  Had some improvement over the course of 3 hours or so and developed some cramping and so decided to come to the ED anyway.  She denies recent illness denies cough congestion or fever denies nausea vomiting or diarrhea.  She has been doing her peritoneal dialysis at home and has been struggling a bit with it.  The history is provided by the patient.  Hyperglycemia Associated symptoms: increased thirst and polyuria   Associated symptoms: no chest pain, no dizziness, no dysuria, no fever, no nausea, no shortness of breath and no vomiting   Hypertension Pertinent negatives include no chest pain, no headaches and no shortness of breath.  Illness Severity:  Moderate Onset quality:  Gradual Duration:  3 days Timing:  Constant Progression:  Worsening Chronicity:  New Associated symptoms: myalgias   Associated symptoms: no chest pain, no congestion, no fever, no headaches, no nausea, no rhinorrhea, no shortness of breath, no vomiting and no wheezing       Past Medical History:  Diagnosis Date   Anemia    Cataract    right eye   Complication of anesthesia     " I woke up itching from anesthesia many years ago."    Depression    at times   Diabetes mellitus 1986    poorly controlled last hemoglobin A1c  10.5 on January 01, 2010, increase since June 2011 when hemoglobin A1c was 9.3 , determined to be most likely secondary to  non-compliance.   Diabetic nephropathy ([place])    Diabetic retinopathy    Dyspnea    "when I have too much fluid"   End stage renal disease on dialysis ([place])    T/Th/Sat [address]   Glaucoma    History of blood transfusion    Hyperlipidemia    Hypertension    Nausea and vomiting    Neuropathy    Retinopathy    Wears glasses     Patient Active Problem List   Diagnosis Date Noted   Type 2 diabetes mellitus with hyperglycemia, with long-term current use of insulin ([place]) 06/03/2019   Type 2 diabetes mellitus with proliferative retinopathy, with long-term current use of insulin ([place]) 06/03/2019   Type 2 diabetes mellitus with diabetic polyneuropathy, with long-term current use of insulin ([place]) 06/03/2019   Type 2 diabetes mellitus with chronic kidney disease on chronic dialysis, with long-term current use of insulin ([place]) 06/03/2019   Dyslipidemia 06/03/2019   ESRD on dialysis ([place]) 05/20/2019   Hyperkalemia, diminished renal excretion 01/11/2019   ESRD (end stage renal disease) on dialysis ([place]) 11/18/2018   Normocytic anemia 11/18/2018   Diabetes mellitus without complication ([place]) 32/91/9166   Nausea and vomiting 09/28/2017   Insomnia 06/05/2017   Chronic seasonal allergic rhinitis 12/10/2015   Arthralgia of both hands 12/10/2015   Severe episode of recurrent major depressive disorder, without psychotic features ([place]) 12/10/2015  NEPHROPATHY, DIABETIC 09/23/2006   Essential hypertension 12/23/2005   DM (diabetes mellitus) type II uncontrolled with eye manifestation ([place]) 01/07/1984    Past Surgical History:  Procedure Laterality Date   ABDOMINAL HYSTERECTOMY   2004   AV FISTULA PLACEMENT Left 01/21/2019   Procedure: BRACHIOCEPHALIC ARTERIOVENOUS (AV) FISTULA CREATION LEFT ARM;  Surgeon: Serafina Mitchell, MD;  Location: [facility] OR;  Service: Vascular;  Laterality: Left;   EYE SURGERY     FISTULA SUPERFICIALIZATION Left 05/20/2019   Procedure: FISTULA SUPERFICIALIZATION LEFT  PROXIMAL FISTULA;  Surgeon: Serafina Mitchell, MD;  Location: [facility] OR;  Service: Vascular;  Laterality: Left;   IR FLUORO GUIDE CV LINE RIGHT  11/15/2018   IR US GUIDE VASC ACCESS RIGHT  11/15/2018   MYOMECTOMY  1994   REFRACTIVE SURGERY Bilateral      OB History   No obstetric history on file.     Family History  Problem Relation Age of Onset   Diabetes Mother    Stroke Mother    Hypertension Mother    Alcohol abuse Mother    Depression Father        Committed suicide   Alcohol abuse Maternal Uncle     Social History   Tobacco Use   Smoking status: Never   Smokeless tobacco: Never  Vaping Use   Vaping Use: Never used  Substance Use Topics   Alcohol use: No   Drug use: No    Home Medications Prior to Admission medications   Medication Sig Start Date End Date Taking? Authorizing Provider  amLODipine (NORVASC) 10 MG tablet Take 1 tablet (10 mg total) by mouth daily. Must have office visit for refills 01/03/20  Yes Newlin, Enobong, MD  atorvastatin (LIPITOR) 20 MG tablet Take 1 tablet by mouth daily. 11/30/19  Yes Ladell Pier, MD  calcium acetate (PHOSLO) 667 MG capsule Take 667-2,001 mg by mouth See admin instructions. 2,001 mg with meals 3 times daily and 667 mg 2 times daily with snacks   Yes [provider]  carvedilol (COREG) 25 MG tablet Take 1 and 1/2 tablets by mouth twice daily with a meal. Must have office visit for refills 12/26/19  Yes Ladell Pier, MD  cinacalcet (SENSIPAR) 60 MG tablet Take 60 mg by mouth daily. 03/22/19  Yes [provider]  cloNIDine (CATAPRES) 0.2 MG tablet Take 0.2 mg by mouth 2 (two) times daily. 06/27/20  Yes [provider]  Insulin Glargine (BASAGLAR KWIKPEN) 100 UNIT/ML SOPN Inject 0.26 mLs (26 Units total) into the skin at bedtime. Taking 26 units at hs 01/24/19  Yes Ladell Pier, MD  Blood Glucose Monitoring Suppl (TRUE METRIX METER) w/Device KIT 1 each by Does not apply route as needed. Patient  not taking: Reported on 02/29/2020 11/18/18   Ladell Pier, MD  Continuous Blood Gluc Receiver (DEXCOM G6 RECEIVER) DEVI Use as instructed to check blood sugar daily 03/29/20   Shamleffer, Melanie Crazier, MD  Continuous Blood Gluc Sensor (DEXCOM G6 SENSOR) MISC 1 Device by Does not apply route as directed. 02/29/20   Shamleffer, Melanie Crazier, MD  Continuous Blood Gluc Transmit (DEXCOM G6 TRANSMITTER) MISC 1 Device by Does not apply route as directed. 02/29/20   Shamleffer, Melanie Crazier, MD  glucose blood (ONETOUCH VERIO) test strip Use as instructed to check blood sugar 3 times daily Dx is E11.65 03/23/20   Shamleffer, Melanie Crazier, MD  Insulin Aspart FlexPen 100 UNIT/ML SOPN Inject 4 units into skin three times daily with  meals. Max 40 units with sliding scale. 04/03/20   Shamleffer, Melanie Crazier, MD  Insulin Pen Needle (B-D UF III MINI PEN NEEDLES) 31G X 5 MM MISC 1 each by Does not apply route 4 (four) times daily. E11.9 06/24/19   Shamleffer, Melanie Crazier, MD  Insulin Syringe-Needle U-100 (INSULIN SYRINGE 1CC/31GX5/16") 31G X 5/16" 1 ML MISC Use as directed.  BD 34m x 31G needles 01/22/17   JLadell Pier MD  Insulin Syringes, Disposable, U-100 0.5 ML MISC 1 each by Does not apply route 2 (two) times daily. 01/30/14   Funches, JAdriana Mccallum MD  TRUEPLUS LANCETS 28G MISC 1 each by Does not apply route 3 (three) times daily. 01/30/14   Funches, JAdriana Mccallum MD  zolpidem (AMBIEN) 5 MG tablet Take 1 tablet (5 mg total) by mouth at bedtime as needed. Each prescription to last a month. Patient not taking: No sig reported 06/10/19   JLadell Pier MD    Allergies    Biopatch protective disk-chg [chlorhexidine], Doxycycline, and Victoza [liraglutide]  Review of Systems   Review of Systems  Constitutional:  Negative for chills and fever.  HENT:  Negative for congestion and rhinorrhea.   Eyes:  Negative for redness and visual disturbance.  Respiratory:  Negative for shortness of breath and  wheezing.   Cardiovascular:  Negative for chest pain and palpitations.  Gastrointestinal:  Negative for nausea and vomiting.  Endocrine: Positive for polydipsia, polyphagia and polyuria.  Genitourinary:  Negative for dysuria and urgency.  Musculoskeletal:  Positive for myalgias. Negative for arthralgias.  Skin:  Negative for pallor and wound.  Neurological:  Negative for dizziness and headaches.   Physical Exam Updated Vital Signs BP (!) 195/82   Pulse 71   Temp 97.8 F (36.6 C)   Resp (!) 22   LMP 03/13/2002   SpO2 98%   Physical Exam Vitals and nursing note reviewed.  Constitutional:      General: She is not in acute distress.    Appearance: She is well-developed. She is not diaphoretic.  HENT:     Head: Normocephalic and atraumatic.  Eyes:     Pupils: Pupils are equal, round, and reactive to light.  Cardiovascular:     Rate and Rhythm: Normal rate and regular rhythm.     Heart sounds: No murmur heard.   No friction rub. No gallop.  Pulmonary:     Effort: Pulmonary effort is normal.     Breath sounds: No wheezing or rales.  Abdominal:     General: There is no distension.     Palpations: Abdomen is soft.     Tenderness: There is no abdominal tenderness.  Musculoskeletal:        General: No tenderness.     Cervical back: Normal range of motion and neck supple.  Skin:    General: Skin is warm and dry.  Neurological:     Mental Status: She is alert and oriented to person, place, and time.  Psychiatric:        Behavior: Behavior normal.    ED Results / Procedures / Treatments   Labs (all labs ordered are listed, but only abnormal results are displayed) Labs Reviewed  CBC WITH DIFFERENTIAL/PLATELET - Abnormal; Notable for the following components:      Result Value   Hemoglobin 11.9 (*)    All other components within normal limits  COMPREHENSIVE METABOLIC PANEL - Abnormal; Notable for the following components:   Sodium 133 (*)    Chloride 94 (*)  CO2 21 (*)     Glucose, Bld 275 (*)    BUN 89 (*)    Creatinine, Ser 14.41 (*)    Calcium 8.7 (*)    Total Protein 4.9 (*)    Albumin 2.6 (*)    Alkaline Phosphatase 223 (*)    GFR, Estimated 3 (*)    Anion gap 18 (*)    All other components within normal limits  CBG MONITORING, ED - Abnormal; Notable for the following components:   Glucose-Capillary 350 (*)    All other components within normal limits  CBG MONITORING, ED - Abnormal; Notable for the following components:   Glucose-Capillary 64 (*)    All other components within normal limits  RESP PANEL BY RT-PCR (FLU A&B, COVID) [id number]  CBG MONITORING, ED    EKG None  Radiology DG Chest 2 View  Result Date: 08/20/2020 CLINICAL DATA:  Cough EXAM: CHEST - 2 VIEW COMPARISON:  September 2020 FINDINGS: Persistent elevation of the right hemidiaphragm. No consolidation or edema. No pleural effusion. Mild cardiomegaly. No acute osseous abnormality. There is free intraperitoneal air along the diaphragms. IMPRESSION: Persistent elevation of the right hemidiaphragm. No new consolidation or edema. Pneumoperitoneum along the diaphragms likely secondary to peritoneal dialysis. These results were called by telephone at the time of interpretation on 08/20/2020 at 4:25 pm to provider [facility] , who verbally acknowledged these results. Electronically Signed   By: Macy Mis M.D.   On: 08/20/2020 16:26    Procedures Procedures   Medications Ordered in ED Medications  lactated ringers bolus 1,000 mL (0 mLs Intravenous Stopped 08/20/20 2139)  insulin aspart (novoLOG) injection 5 Units (5 Units Intravenous Given 08/20/20 2023)  magnesium oxide (MAG-OX) tablet 800 mg (800 mg Oral Given 08/20/20 2022)    ED Course  I have reviewed the triage vital signs and the nursing notes.  Pertinent labs & imaging results that were available during my care of the patient were reviewed by me and considered in my medical decision making (see chart for details).     MDM Rules/Calculators/A&P                           60 yo F with a chief complaints of hypoglycemia.  She was seen by her diabetic nutritionist today and found to have an elevated blood sugar.  Eventually told them that she has not taken her diabetes medicines in at least a few days.  She refused to come to the ED and had some treatment done in the clinic.  Her lab work here she possibly could be in diabetic ketoacidosis, however the other possibility is that she has a metabolic acidosis with anion gap from uremia.  The patient does not appear to be doing really well with her peritoneal dialysis.  I had a discussion with her about this and she seems convinced that she does not need to come into the hospital and would never go back on hemodialysis.  We will give a bolus of IV fluids here and a small bolus of insulin.  She is requesting a meal.  Patient became hypoglycemic, improved with eating.  Feeling much better on reassessment.  We will have her follow-up with her family doctor.  I strongly encouraged her to follow-up with her nephrologist and she tell me she has an appointment in 2 days time.  10:44 PM:  I have discussed the diagnosis/risks/treatment options with the patient and believe the pt to  be eligible for discharge home to follow-up with PCP, nephrology. We also discussed returning to the ED immediately if new or worsening sx occur. We discussed the sx which are most concerning (e.g., sudden worsening pain, fever, inability to tolerate by mouth) that necessitate immediate return. Medications administered to the patient during their visit and any new prescriptions provided to the patient are listed below.  Medications given during this visit Medications  lactated ringers bolus 1,000 mL (0 mLs Intravenous Stopped 08/20/20 2139)  insulin aspart (novoLOG) injection 5 Units (5 Units Intravenous Given 08/20/20 2023)  magnesium oxide (MAG-OX) tablet 800 mg (800 mg Oral Given 08/20/20 2022)      The patient appears reasonably screen and/or stabilized for discharge and I doubt any other medical condition or other [facility] requiring further screening, evaluation, or treatment in the ED at this time prior to discharge.   Final Clinical Impression(s) / ED Diagnoses Final diagnoses:  Hyperglycemia  Uremia    Rx / DC Orders ED Discharge Orders     None        Deno Etienne, DO 08/20/20 2244

## 2020-08-20 NOTE — Discharge Instructions (Addendum)
Please follow-up with your nephrologist.  I think they need to evaluate you and see how your peritoneal dialysis is performing.  Please return to the emergency department if things worsen.  Looks like you have follow-up tomorrow with your diabetic nutritionist please keep that appointment so they can see how you are doing tomorrow.

## 2020-08-20 NOTE — ED Provider Notes (Signed)
Emergency Medicine Provider Triage Evaluation Note  Ann Bryan , a 60 y.o. female  was evaluated in triage.  Pt complains of hyperglycemia.  Patient was at her endocrinologist prior to arrival getting an insulin pump placed.  Her blood sugars were reading high.  She was at the office for several hours and received insulin to help lower her blood sugars.  She developed cramping into they sent her to the ED.  She further states she has felt unwell recently like she has a cold.  She said congestion and a cough.  Review of Systems  Positive: Hyperglycemia, cramping, uri sxs Negative: Chest pain  Physical Exam  BP (!) 195/71 (BP Location: Left Arm)   Pulse 61   Temp 97.8 F (36.6 C)   Resp 16   LMP 03/13/2002   SpO2 98%  Gen:   Awake, no distress   Resp:  Normal effort  MSK:   Moves extremities without difficulty  Other:    Medical Decision Making  Medically screening exam initiated at 3:47 PM.  Appropriate orders placed.  Ann Bryan was informed that the remainder of the evaluation will be completed by another provider, this initial triage assessment does not replace that evaluation, and the importance of remaining in the ED until their evaluation is complete.     Rodney Booze, PA-C 08/20/20 1547    Lorelle Gibbs, DO 08/21/20 1506

## 2020-08-20 NOTE — ED Triage Notes (Signed)
Pt via EMS from endocrinologist office-CBG read high. They worked with her and the new insulin pump x 3 hours and got CBG to 430. Pt then reported "all over cramping"-hx of same, ongoing x a few years. Hypertensive with hx of same but is noncompliant with medication. Home dialysis patient and has "ended early" the last several treatments.

## 2020-08-20 NOTE — Patient Instructions (Addendum)
REad over resource manual today and call pump help line if questions.] Call Dexcom 800 help line to see if she can link dexcom transmitter to her phone.

## 2020-08-20 NOTE — Progress Notes (Signed)
Patient called 30 min. After her appointment time saying she was down in the lobby and did not know where to come.  She was directed here to Dr. Quin Hoop office.  Said her vision was blurry, but that she could see the pump screen and would be able to view it using her magnifier on her IPAD. She also reported that she had a cold, and blood sugar was probably high, but could see the pump screen and was able to proceed with the training. Patient was trained on how to insert the Dexcom and insert the transmitter.  She wanted this linked to her phone.  Her phone was dead and so was charged, but would not link to her transmitter, despite trying X2.   Settings were put into the pump per DR. Shamleffer's orders: basal rate: 0.8,  ISF: 35, I/C: 15, target: 120 with correction over 120, timing: 4 hours.  She was shown how to fill a pod, and did this with Novolog insulin.  She applied the pod to her left upper abdomen and started this at 11:30 AM.  She reports not having taken her Basaglar insulin last pm as directed by me.   She was shown how to bolus and she re demonstrated this correctly.  But when doing this, she tested her blood sugar and it read "HI".  After many questions, she admitted that she has not had any Novolog nor Basaglar since before Saturday.   Per DR. Shamleffer's orders, she was told to go to the ER without her pump, but she refused this.  She was then given a correction dose of 8 u at 11:45.  She was told not to leave the office until her blood sugar is below 200, and that we would be doing blood sugar readings q 1 hour on her.  She agreed to do this and had no final questions.

## 2020-08-21 ENCOUNTER — Telehealth: Payer: Self-pay | Admitting: Nutrition

## 2020-08-21 ENCOUNTER — Encounter: Payer: Medicare Other | Admitting: Nutrition

## 2020-08-21 NOTE — Telephone Encounter (Signed)
Patient called with message on my machine that her ride did not show for her appointment today.  She says she is going to go back on injections for now. She was called back and left a message stating that I needed to hear from her today, saying that she removed the pod and was starting back on both insulins. I tried her back again at 4:15PM and again at [place] leaving the same above message.

## 2020-08-22 ENCOUNTER — Telehealth: Payer: Self-pay | Admitting: Nutrition

## 2020-08-22 NOTE — Telephone Encounter (Signed)
Patient did not answer her cell phone.  Another message left on her home phone to call me to confirm she has removed the pod and has started back on her Basaglar and Novolog insulin.  Also to see if she wants to start on the Dexcom using the reader that she left in my office yesterday

## 2020-08-23 ENCOUNTER — Telehealth: Payer: Self-pay | Admitting: Internal Medicine

## 2020-08-23 NOTE — Telephone Encounter (Signed)
Patient requests to be called at ph# [phone] re: Patient's Diabetes Treatment

## 2020-08-27 ENCOUNTER — Encounter: Payer: Medicare Other | Admitting: Nutrition

## 2020-08-27 NOTE — Telephone Encounter (Signed)
Her phone is not able to be used for the Dexcom.  I have gotten her a reader, and have made her an appointment for next week to link Dexcom to the reader

## 2020-08-27 NOTE — Telephone Encounter (Signed)
Pt called and stated she has removed the pod and has started back on her old medication and wants advice and wants someone to call her back

## 2020-08-28 ENCOUNTER — Encounter: Payer: Medicare Other | Admitting: Nutrition

## 2020-08-30 ENCOUNTER — Telehealth: Payer: Self-pay | Admitting: Dietician

## 2020-08-30 NOTE — Telephone Encounter (Signed)
Returned patient call. Patient has an appointment for [facility] training on 8/30.  Patient states that she wishes to cancel this appointment and is not ready to reschedule at this time due to other medical concerns.  She also states that she needs a refill of the Novolog Pen and for 4 mm pen needles.  Patient to call back to reschedule Dexcom when she is ready. Patient to call for questions as needed.  Antonieta Iba, RD, LDN, CDCES

## 2020-08-31 MED ORDER — INSULIN PEN NEEDLE 32G X 4 MM MISC: 1.0000 | Freq: Four times a day (QID) | 3 refills | Status: AC

## 2020-08-31 MED ORDER — NOVOLOG FLEXPEN 100 UNIT/ML ~~LOC~~ SOPN: PEN_INJECTOR | SUBCUTANEOUS | 3 refills | Status: DC

## 2020-08-31 NOTE — Telephone Encounter (Signed)
LVM--regarding Rx sent to [facility].

## 2020-09-04 ENCOUNTER — Encounter: Payer: Medicare Other | Admitting: Nutrition

## 2020-09-11 NOTE — Telephone Encounter (Signed)
Patient called to advise that [facility] has advised her that they are waiting on confirmation or information from [place].  Please contact [facility] to clarify their needs and provide required information  Please call patient back at [phone]

## 2020-09-11 NOTE — Telephone Encounter (Signed)
How may units daily for the Novolog flex pen. I saw 4 units in last note but patient wasn't taking so didn't know if it was increased

## 2020-09-12 NOTE — Telephone Encounter (Signed)
[facility] has been notified of direction on Novolog

## 2020-09-17 NOTE — Telephone Encounter (Signed)
Patient called to advise that the prescription situation with [facility] However her RX will not be delivered from [place] until 10/03/20.  Patient does not have any Novolog left.  Patient is requesting a gap RX for [employer] and pen needles to be sen to [facility] on [address]

## 2020-09-19 ENCOUNTER — Telehealth: Payer: Self-pay | Admitting: Pharmacy Technician

## 2020-09-19 ENCOUNTER — Telehealth: Payer: Self-pay

## 2020-09-19 MED ORDER — NOVOLOG FLEXPEN 100 UNIT/ML ~~LOC~~ SOPN: PEN_INJECTOR | SUBCUTANEOUS | 0 refills | Status: DC

## 2020-09-19 NOTE — Telephone Encounter (Signed)
Patient states that Novolog needs possible a PA before Medicare D will cover. Can you check into this.

## 2020-09-19 NOTE — Telephone Encounter (Signed)
Patient is out of Novolog and states that [facility] fill because her Plan D doesn't cover it. Patient would like to have sample or would like to know what else can be taking.

## 2020-09-19 NOTE — Telephone Encounter (Signed)
Patient states that she heard from [facility] and medication has been approved and she will contact [facility] to get medication

## 2020-09-19 NOTE — Telephone Encounter (Signed)
Patient called again and requests to be called at ph# [phone] to be advised about what to do. Patient states she is unable to get RX for Novolog Flexpen from any PHARM due to Part D will not cover the Novolog Flexpen [facility] told Patient there is an option if some sort of form is filled out, however, Patient states she is not sure if even that would help the situation). Patient states she is completely out of Novolog.

## 2020-09-19 NOTE — Telephone Encounter (Signed)
Received notification from [facility] regarding a prior authorization for [place]. Authorization has been APPROVED from 8.15.22 to 9.14.23.   Authorization # [id number]

## 2020-09-19 NOTE — Telephone Encounter (Signed)
Patient Advocate Encounter   Received notification from [employer] that prior authorization for NOVOLOG is required.   PA submitted on 09/19/20 Key [id number] Status is pending    [facility] will continue to follow   Burney Gauze, CPhT Patient [facility] Phone: [phone] Fax:  [phone]

## 2020-09-26 ENCOUNTER — Telehealth: Payer: Self-pay | Admitting: Nutrition

## 2020-09-26 NOTE — Telephone Encounter (Signed)
Patient was reminded that I have her Dexcom reader her, and I suggested she come in and I can set up her reader to read the Dexcom, because her phone does not have enough data.  She said she prefers the phone, but wants to keep the reader in case she can not afford a new phone.  I told her that I will leave it up at the front desk for her to pick this up at her convenience.

## 2020-10-01 NOTE — Telephone Encounter (Signed)
Reader is still in the office.  Patient called and she said she will come by and pick this up, but does not want to do this at this time.  Reader left up front for patient to pick up.

## 2020-11-20 ENCOUNTER — Ambulatory Visit: Payer: Medicare Other | Admitting: Internal Medicine

## 2020-12-18 ENCOUNTER — Other Ambulatory Visit: Payer: Self-pay

## 2020-12-18 DIAGNOSIS — H47012 Ischemic optic neuropathy, left eye: Secondary | ICD-10-CM

## 2020-12-20 ENCOUNTER — Telehealth: Payer: Self-pay | Admitting: Internal Medicine

## 2020-12-20 DIAGNOSIS — Z794 Long term (current) use of insulin: Secondary | ICD-10-CM

## 2020-12-20 MED ORDER — TRUEPLUS LANCETS 28G MISC: 1.0000 | Freq: Three times a day (TID) | 12 refills | Status: AC

## 2020-12-20 NOTE — Telephone Encounter (Signed)
Script sent  

## 2020-12-20 NOTE — Telephone Encounter (Signed)
MEDICATION: TRUEPLUS LANCETS 28G MISC  PHARMACY:   [facility] [place] Phone:  [phone]  Fax:  [phone]      HAS THE PATIENT CONTACTED [place]?  yes  IS THIS A 90 DAY SUPPLY : yes  IS PATIENT OUT OF MEDICATION: 1 day supply left  IF NOT; HOW MUCH IS LEFT: 1  LAST APPOINTMENT DATE: @9 /14/2022  NEXT APPOINTMENT DATE:@Visit  date not found  DO WE HAVE YOUR PERMISSION TO LEAVE A DETAILED MESSAGE?:  OTHER COMMENTS:    **Let patient know to contact pharmacy at the end of the day to make sure medication is ready. **  ** Please notify patient to allow 48-72 hours to process**  **Encourage patient to contact the pharmacy for refills or they can request refills through [facility]**

## 2020-12-21 ENCOUNTER — Other Ambulatory Visit: Payer: Self-pay | Admitting: Internal Medicine

## 2020-12-21 DIAGNOSIS — E1169 Type 2 diabetes mellitus with other specified complication: Secondary | ICD-10-CM

## 2020-12-24 ENCOUNTER — Other Ambulatory Visit: Payer: Medicare Other

## 2020-12-24 ENCOUNTER — Telehealth: Payer: Self-pay | Admitting: Internal Medicine

## 2020-12-24 ENCOUNTER — Other Ambulatory Visit: Payer: Self-pay | Admitting: Internal Medicine

## 2020-12-24 MED ORDER — LANTUS SOLOSTAR 100 UNIT/ML ~~LOC~~ SOPN: 26.0000 [IU] | PEN_INJECTOR | Freq: Every day | SUBCUTANEOUS | 0 refills | Status: DC

## 2020-12-24 NOTE — Telephone Encounter (Signed)
MEDICATION: Lantus  PHARMACY:   [place], [place] Phone:  [phone]  Fax:  [phone]      HAS THE PATIENT CONTACTED [facility]?  Yes-requires new RX  IS THIS A 90 DAY SUPPLY : Yes  IS PATIENT OUT OF MEDICATION: No  IF NOT; HOW MUCH IS LEFT: 1 Pen  LAST APPOINTMENT DATE: @12 /15/2022  NEXT APPOINTMENT DATE:@Visit  date not found--Patient declined to reschedule No Show appointment 05/30/20  DO WE HAVE YOUR PERMISSION TO LEAVE A DETAILED MESSAGE?: Yes  OTHER COMMENTS:    **Let patient know to contact pharmacy at the end of the day to make sure medication is ready. **  ** Please notify patient to allow 48-72 hours to process**  **Encourage patient to contact the pharmacy for refills or they can request refills through [facility]**

## 2021-01-16 ENCOUNTER — Ambulatory Visit: Payer: Medicare Other | Attending: Internal Medicine

## 2021-01-16 ENCOUNTER — Other Ambulatory Visit: Payer: Self-pay

## 2021-01-16 DIAGNOSIS — H47012 Ischemic optic neuropathy, left eye: Secondary | ICD-10-CM

## 2021-01-17 LAB — CBC WITH DIFFERENTIAL/PLATELET
Basophils Absolute: 0 10*3/uL (ref 0.0–0.2)
Basos: 1 %
EOS (ABSOLUTE): 0.3 10*3/uL (ref 0.0–0.4)
Eos: 3 %
Hematocrit: 38.1 % (ref 34.0–46.6)
Hemoglobin: 12.1 g/dL (ref 11.1–15.9)
Immature Grans (Abs): 0.1 10*3/uL (ref 0.0–0.1)
Immature Granulocytes: 1 %
Lymphocytes Absolute: 1.9 10*3/uL (ref 0.7–3.1)
Lymphs: 23 %
MCH: 27.4 pg (ref 26.6–33.0)
MCHC: 31.8 g/dL (ref 31.5–35.7)
MCV: 86 fL (ref 79–97)
Monocytes Absolute: 0.8 10*3/uL (ref 0.1–0.9)
Monocytes: 9 %
Neutrophils Absolute: 5.2 10*3/uL (ref 1.4–7.0)
Neutrophils: 63 %
Platelets: 303 10*3/uL (ref 150–450)
RBC: 4.41 x10E6/uL (ref 3.77–5.28)
RDW: 12.9 % (ref 11.7–15.4)
WBC: 8.2 10*3/uL (ref 3.4–10.8)

## 2021-01-17 LAB — SEDIMENTATION RATE: Sed Rate: 38 mm/hr (ref 0–40)

## 2021-01-17 LAB — C-REACTIVE PROTEIN: CRP: 3 mg/L (ref 0–10)

## 2021-01-17 NOTE — Progress Notes (Signed)
Let patient know that labs that were requested by her ophthalmologist came back okay.  Please make sure that these results are faxed to the specialist that requested them.

## 2021-01-24 ENCOUNTER — Other Ambulatory Visit: Payer: Self-pay | Admitting: Internal Medicine

## 2021-01-30 NOTE — Patient Outreach (Signed)
Received a referral from [facility] for diabetes education and management. I have assigned Raina Mina, RN to outreach within 10 business days.   [facility], [place], [place] Management [phone]

## 2021-02-05 ENCOUNTER — Other Ambulatory Visit: Payer: Self-pay | Admitting: *Deleted

## 2021-02-05 NOTE — Patient Outreach (Signed)
[place] [facility]) Care Management  02/05/2021  NINFA GIANNELLI [birth year] [id number]   Telephone Assessment-Unsuccessful #1  RN initial attempt unsuccessful. RN able to leave a HIPAA approved voice message requesting a call back.  Will send outreach letter and attempt another outreach over the next week.  Raina Mina, RN Care Management Coordinator [place] Office [phone]

## 2021-02-11 ENCOUNTER — Other Ambulatory Visit: Payer: Self-pay | Admitting: *Deleted

## 2021-02-11 ENCOUNTER — Telehealth: Payer: Self-pay | Admitting: Nutrition

## 2021-02-11 NOTE — Patient Outreach (Signed)
[facility] [facility]) Care Management  02/11/2021  Ann Bryan [birth year] [id number]   Telephone Assessment-Successful-Declined [facility]  RN spoke with pt today and introduced the [facility] program and services. Pt reports she is currently working with the Education officer, museum at the dialysis center for diabetic education and to obtain someone to apply her diabetic Omni-pod along with application of her are sensor for glucose monitoring.   Pt states she has enough people involved with her ongoing care and opt to decline [facility] services at this time however appreciative but does not wish to work with RN to improve her ongoing diabetes. Pt states she has supplies to take her glucose levels with finger sticks and able to have a better vision but does not wish to completed CBG finger stick daily.RN attempted education however pt remains no interested and declined. Offered to send [facility] information packet however this was declined.  Based upon pt request to decline enrollment will notify her provider and close this case,  Raina Mina, RN Care Management Coordinator [place] Office [phone]

## 2021-02-11 NOTE — Patient Outreach (Signed)
[place] [facility]) Care Management  02/11/2021  SANIYA TRANCHINA Mar 09, 1960 [id number]   Telephone Assessment-RE: call back  RN spoke briefly with pt today and introduced [facility]. Pt not able to communicate and requested a call back later today.   Will follow up later today as requested for pending [facility] services.  Raina Mina, RN Care Management Coordinator [place] Office [phone]

## 2021-02-11 NOTE — Telephone Encounter (Signed)
Pt came and picked up a [facility] G6 transmitter that was in the cabinet.

## 2021-02-26 ENCOUNTER — Other Ambulatory Visit: Payer: Self-pay

## 2021-02-26 ENCOUNTER — Ambulatory Visit: Payer: Medicare Other | Attending: Internal Medicine | Admitting: Internal Medicine

## 2021-02-26 ENCOUNTER — Encounter: Payer: Self-pay | Admitting: Internal Medicine

## 2021-02-26 VITALS — BP 154/66 | HR 79 | Resp 16 | Ht 60.0 in | Wt 141.2 lb

## 2021-02-26 DIAGNOSIS — Z79899 Other long term (current) drug therapy: Secondary | ICD-10-CM | POA: Insufficient documentation

## 2021-02-26 DIAGNOSIS — E1169 Type 2 diabetes mellitus with other specified complication: Secondary | ICD-10-CM | POA: Diagnosis not present

## 2021-02-26 DIAGNOSIS — L309 Dermatitis, unspecified: Secondary | ICD-10-CM

## 2021-02-26 DIAGNOSIS — N186 End stage renal disease: Secondary | ICD-10-CM

## 2021-02-26 DIAGNOSIS — I129 Hypertensive chronic kidney disease with stage 1 through stage 4 chronic kidney disease, or unspecified chronic kidney disease: Secondary | ICD-10-CM | POA: Diagnosis not present

## 2021-02-26 DIAGNOSIS — Z992 Dependence on renal dialysis: Secondary | ICD-10-CM

## 2021-02-26 DIAGNOSIS — Z794 Long term (current) use of insulin: Secondary | ICD-10-CM | POA: Insufficient documentation

## 2021-02-26 DIAGNOSIS — E1165 Type 2 diabetes mellitus with hyperglycemia: Secondary | ICD-10-CM

## 2021-02-26 DIAGNOSIS — R252 Cramp and spasm: Secondary | ICD-10-CM | POA: Diagnosis not present

## 2021-02-26 DIAGNOSIS — Z1211 Encounter for screening for malignant neoplasm of colon: Secondary | ICD-10-CM

## 2021-02-26 DIAGNOSIS — E1142 Type 2 diabetes mellitus with diabetic polyneuropathy: Secondary | ICD-10-CM | POA: Diagnosis not present

## 2021-02-26 DIAGNOSIS — E1122 Type 2 diabetes mellitus with diabetic chronic kidney disease: Secondary | ICD-10-CM | POA: Diagnosis not present

## 2021-02-26 DIAGNOSIS — I120 Hypertensive chronic kidney disease with stage 5 chronic kidney disease or end stage renal disease: Secondary | ICD-10-CM | POA: Insufficient documentation

## 2021-02-26 DIAGNOSIS — E785 Hyperlipidemia, unspecified: Secondary | ICD-10-CM | POA: Insufficient documentation

## 2021-02-26 LAB — POCT GLYCOSYLATED HEMOGLOBIN (HGB A1C): HbA1c, POC (controlled diabetic range): 12.2 % — AB (ref 0.0–7.0)

## 2021-02-26 LAB — GLUCOSE, POCT (MANUAL RESULT ENTRY): POC Glucose: 400 mg/dl — AB (ref 70–99)

## 2021-02-26 MED ORDER — LANTUS SOLOSTAR 100 UNIT/ML ~~LOC~~ SOPN: 30.0000 [IU] | PEN_INJECTOR | Freq: Every day | SUBCUTANEOUS | 6 refills | Status: AC

## 2021-02-26 MED ORDER — GABAPENTIN 100 MG PO CAPS: ORAL_CAPSULE | ORAL | 3 refills | Status: AC

## 2021-02-26 MED ORDER — NOVOLOG FLEXPEN 100 UNIT/ML ~~LOC~~ SOPN: PEN_INJECTOR | SUBCUTANEOUS | 3 refills | Status: AC

## 2021-02-26 NOTE — Progress Notes (Signed)
Patient ID: Ann Bryan, female    DOB: 1960-07-30  MRN: [id number]  CC: Diabetes, Hypertension, and Foot Pain (B/l)   Subjective: Ann Bryan is a 61 y.o. female who presents for chronic ds management. Her concerns today include:  Patient with history of Peritoneal dialysis, DM, HTN, HL, seasonal allergies, anxiety/depression, glucoma, nephrotic range proteinuria with ESRD and insomnia.  Patient last evaluated by me 06/2019.  She does not have meds with her.  ESRD:  changed to peritoneal dialysis 08/2019 Eval by [facility] for transplant but pt feels she does not have the support to go through transplant -followed by Nephrologist Dr. Joelyn Oms.  Last seen yesterday.   Complains of having numerous dark spots on both lower legs.  States she was told by the nephrologist that these are "phosphorus bumps."  Lesions used to be very itchy.  However itching has decreased and the number of lesions have decreased since she switch from hemodialysis to peritoneal dialysis. -Reports getting a lot of cramps in her legs, arms and sometimes in the trunk.  DM Results for orders placed or performed in visit on 02/26/21  POCT glucose (manual entry)  Result Value Ref Range   POC Glucose 400 (A) 70 - 99 mg/dl  POCT glycosylated hemoglobin (Hb A1C)  Result Value Ref Range   Hemoglobin A1C     HbA1c POC (<> result, manual entry)     HbA1c, POC (prediabetic range)     HbA1c, POC (controlled diabetic range) 12.2 (A) 0.0 - 7.0 %   Last seen by endo Dr. Leonette Monarch 02/2020 Currently on Lantus 26 units daily at bedtime and 4 units Novolog with meals.  Taking both consistently.  Checking BS once a day; readings have been in the 400s.  On average she eats 1 meal a day.  Does not overeat.  Denies using junk foods. Has equipment for insulin pump and Dexcom but had not been using because of poor vision.  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  Cataract extraction 1 mth ago.  Since extraction she is now  seeing better out of the right eye, so she plans to do get back on insulin pump and Dexcom.  Has to f/u with [facility] to set up insulin pump -Complains of feet feeling cold and decreased sensation.  Reports a funny feeling on the soles of the feet when walking.  HTN:  Does not have meds with her.  She tells me she is taking Clonidine 0.2 TID.  Endorses taking Norvasc 10 mg daily, Coreg 25 mg 1.5 BID.  Reports meds were being filled by her nephrologist  HL: Reports still being on Lipitor 20 mg  HM:  Had 2 COVID vaccine.  Due for Tdapt and Zoster. Over due for colon CA screen.  Has no one to go with her to c-scope so agrees to do Cologuard instead   Patient Active Problem List   Diagnosis Date Noted   Type 2 diabetes mellitus with hyperglycemia, with long-term current use of insulin ([place]) 06/03/2019   Type 2 diabetes mellitus with proliferative retinopathy, with long-term current use of insulin ([place]) 06/03/2019   Type 2 diabetes mellitus with diabetic polyneuropathy, with long-term current use of insulin ([place]) 06/03/2019   Type 2 diabetes mellitus with chronic kidney disease on chronic dialysis, with long-term current use of insulin ([place]) 06/03/2019   Dyslipidemia 06/03/2019   ESRD on dialysis ([place]) 05/20/2019   Hyperkalemia, diminished renal excretion 01/11/2019   ESRD (end  stage renal disease) on dialysis ([place]) 11/18/2018   Normocytic anemia 11/18/2018   Diabetes mellitus without complication ([place]) 56/43/3295   Nausea and vomiting 09/28/2017   Insomnia 06/05/2017   Chronic seasonal allergic rhinitis 12/10/2015   Arthralgia of both hands 12/10/2015   Severe episode of recurrent major depressive disorder, without psychotic features ([place]) 12/10/2015   NEPHROPATHY, DIABETIC 09/23/2006   Essential hypertension 12/23/2005   DM (diabetes mellitus) type II uncontrolled with eye manifestation 01/07/1984     Current Outpatient Medications on File Prior to Visit  Medication Sig Dispense  Refill   amLODipine (NORVASC) 10 MG tablet Take 1 tablet (10 mg total) by mouth daily. Must have office visit for refills 30 tablet 0   atorvastatin (LIPITOR) 20 MG tablet Take 1 tablet by mouth daily. 90 tablet 0   Blood Glucose Monitoring Suppl (TRUE METRIX METER) w/Device KIT 1 each by Does not apply route as needed. (Patient not taking: Reported on 02/29/2020) 1 kit 0   calcium acetate (PHOSLO) 667 MG capsule Take 667-2,001 mg by mouth See admin instructions. 2,001 mg with meals 3 times daily and 667 mg 2 times daily with snacks     carvedilol (COREG) 25 MG tablet Take 1 and 1/2 tablets by mouth twice daily with a meal. Must have office visit for refills 90 tablet 0   cinacalcet (SENSIPAR) 60 MG tablet Take 60 mg by mouth daily.     cloNIDine (CATAPRES) 0.2 MG tablet Take 0.2 mg by mouth 2 (two) times daily.     Continuous Blood Gluc Receiver (DEXCOM G6 RECEIVER) DEVI Use as instructed to check blood sugar daily 1 each 0   Continuous Blood Gluc Sensor (DEXCOM G6 SENSOR) MISC 1 Device by Does not apply route as directed. 9 each 3   Continuous Blood Gluc Transmit (DEXCOM G6 TRANSMITTER) MISC 1 Device by Does not apply route as directed. 1 each 3   glucose blood (ONETOUCH VERIO) test strip Use as instructed to check blood sugar 3 times daily Dx is E11.65 150 each 5   Insulin Pen Needle 32G X 4 MM MISC 1 Device by Does not apply route in the morning, at noon, in the evening, and at bedtime. 400 each 3   Insulin Syringe-Needle U-100 (INSULIN SYRINGE 1CC/31GX5/16") 31G X 5/16" 1 ML MISC Use as directed.  BD 30m x 31G needles 100 each 6   Insulin Syringes, Disposable, U-100 0.5 ML MISC 1 each by Does not apply route 2 (two) times daily. 100 each 8   TRUEplus Lancets 28G MISC 1 each by Does not apply route 3 (three) times daily. 100 each 12   No current facility-administered medications on file prior to visit.    Allergies  Allergen Reactions   Biopatch Protective Disk-Chg [Chlorhexidine]      States rash with use of CHG bath   Doxycycline Nausea And Vomiting   Victoza [Liraglutide] Nausea And Vomiting    Social History   Socioeconomic History   Marital status: Single    Spouse name: Not on file   Number of children: Not on file   Years of education: Not on file   Highest education level: Not on file  Occupational History   Not on file  Tobacco Use   Smoking status: Never   Smokeless tobacco: Never  Vaping Use   Vaping Use: Never used  Substance and Sexual Activity   Alcohol use: No   Drug use: No   Sexual activity: Not Currently  Other Topics  Concern   Not on file  Social History Narrative   Not on file   Social Determinants of Health   Financial Resource Strain: Not on file  Food Insecurity: Not on file  Transportation Needs: Not on file  Physical Activity: Not on file  Stress: Not on file  Social Connections: Not on file  Intimate Partner Violence: Not on file    Family History  Problem Relation Age of Onset   Diabetes Mother    Stroke Mother    Hypertension Mother    Alcohol abuse Mother    Depression Father        Committed suicide   Alcohol abuse Maternal Uncle     Past Surgical History:  Procedure Laterality Date   ABDOMINAL HYSTERECTOMY   2004   AV FISTULA PLACEMENT Left 01/21/2019   Procedure: BRACHIOCEPHALIC ARTERIOVENOUS (AV) FISTULA CREATION LEFT ARM;  Surgeon: Serafina Mitchell, MD;  Location: [facility];  Service: Vascular;  Laterality: Left;   EYE SURGERY     FISTULA SUPERFICIALIZATION Left 05/20/2019   Procedure: FISTULA SUPERFICIALIZATION LEFT PROXIMAL FISTULA;  Surgeon: Serafina Mitchell, MD;  Location: [facility] OR;  Service: Vascular;  Laterality: Left;   IR FLUORO GUIDE CV LINE RIGHT  11/15/2018   IR US GUIDE VASC ACCESS RIGHT  11/15/2018   MYOMECTOMY  1994   REFRACTIVE SURGERY Bilateral     ROS: Review of Systems Negative except as stated above  PHYSICAL EXAM: BP (!) 154/66    Pulse 79    Resp 16    Ht 5' (1.524 m)    Wt 141 lb 3.2  oz (64 kg)    LMP 03/13/2002    SpO2 96%    BMI 27.58 kg/m   Physical Exam  General appearance -middle-age to older African-American female who appears chronically ill. Mental status -flat affect Mouth - mucous membranes moist, pharynx normal without lesions Neck - supple, no significant adenopathy Chest - clear to auscultation, no wheezes, rales or rhonchi, symmetric air entry Heart - normal rate, regular rhythm, normal S1, S2, no murmurs, rubs, clicks or gallops Extremities -no lower extremity edema Skin -numerous hyperpigmented raised circular spots on both lower legs.   CMP Latest Ref Rng & Units 08/20/2020 02/29/2020 05/20/2019  Glucose 70 - 99 mg/dL 275(H) 228(H) -  BUN 6 - 20 mg/dL 89(H) 64(H) -  Creatinine 0.44 - 1.00 mg/dL 14.41(H) 13.88(HH) 8.75(H)  Sodium 135 - 145 mmol/L 133(L) 138 -  Potassium 3.5 - 5.1 mmol/L 4.1 3.9 -  Chloride 98 - 111 mmol/L 94(L) 97 -  CO2 22 - 32 mmol/L 21(L) 30 -  Calcium 8.9 - 10.3 mg/dL 8.7(L) 9.2 -  Total Protein 6.5 - 8.1 g/dL 4.9(L) - -  Total Bilirubin 0.3 - 1.2 mg/dL 0.5 - -  Alkaline Phos 38 - 126 U/L 223(H) - -  AST 15 - 41 U/L 16 - -  ALT 0 - 44 U/L 39 - -   Lipid Panel     Component Value Date/Time   CHOL 136 11/18/2018 1449   TRIG 164 (H) 11/18/2018 1449   HDL 35 (L) 11/18/2018 1449   CHOLHDL 3.9 11/18/2018 1449   CHOLHDL 3.2 12/10/2015 1145   VLDL 12 12/10/2015 1145   LDLCALC 73 11/18/2018 1449    CBC    Component Value Date/Time   WBC 8.2 01/16/2021 1018   WBC 6.3 08/20/2020 1553   RBC 4.41 01/16/2021 1018   RBC 4.34 08/20/2020 1553   HGB 12.1 01/16/2021 1018  HCT 38.1 01/16/2021 1018   PLT 303 01/16/2021 1018   MCV 86 01/16/2021 1018   MCH 27.4 01/16/2021 1018   MCH 27.4 08/20/2020 1553   MCHC 31.8 01/16/2021 1018   MCHC 32.2 08/20/2020 1553   RDW 12.9 01/16/2021 1018   LYMPHSABS 1.9 01/16/2021 1018   MONOABS 0.4 08/20/2020 1553   EOSABS 0.3 01/16/2021 1018   BASOSABS 0.0 01/16/2021 1018    ASSESSMENT AND  PLAN: 1. Type 2 diabetes mellitus with hyperglycemia, with long-term current use of insulin (HCC) Not at goal. Healthy eating habits discussed. We will try to get her back in with Dr. Leonette Monarch. Increase Lantus insulin to 30 units daily.  Increase NovoLog to 7 units with meals. We will have her see our clinical pharmacist in a few days to help her get her Dexcom device set up. - POCT glucose (manual entry) - POCT glycosylated hemoglobin (Hb A1C) - Microalbumin / creatinine urine ratio; Future - Ambulatory referral to Endocrinology - insulin aspart (NOVOLOG FLEXPEN) 100 UNIT/ML FlexPen; 7 units subcut with meals  Dispense: 15 mL; Refill: 3 - insulin glargine (LANTUS SOLOSTAR) 100 UNIT/ML Solostar Pen; Inject 30 Units into the skin daily.  Dispense: 15 mL; Refill: 6 - Comprehensive metabolic panel; Future  2. Diabetic peripheral neuropathy ([place]) Gust trying her with low-dose gabapentin.  We will start with 100 mg every other day - gabapentin (NEURONTIN) 100 MG capsule; 1 cap PO every other day  Dispense: 30 capsule; Refill: 3  3. ESRD (end stage renal disease) on dialysis [facility]) On peritoneal dialysis.  Followed by Dr. Joelyn Oms.  4. Renal hypertension Not at goal.  I question whether she is really taking the carvedilol 25 mg 1-1/2 tablets twice a day given that her pulse is almost 80 on such a high dose.  Encourage compliance with medications.  5. Hyperlipidemia associated with type 2 diabetes mellitus ([place]) Advised patient that atorvastatin can cause cramps.  I recommend changing the Lipitor to 20 mg 3 days a week to see whether the cramps decreased. - Comprehensive metabolic panel; Future - Lipid panel; Future  6. Cramp and spasm See #5 above.  7. Dermatitis - Ambulatory referral to Dermatology  8. Screening for colon cancer - Cologuard     Patient was given the opportunity to ask questions.  Patient verbalized understanding of the plan and was able to repeat key elements of  the plan.   Orders Placed This Encounter  Procedures   Microalbumin / creatinine urine ratio   Cologuard   Ambulatory referral to Endocrinology   Ambulatory referral to Dermatology   POCT glucose (manual entry)   POCT glycosylated hemoglobin (Hb A1C)     Requested Prescriptions   Signed Prescriptions Disp Refills   gabapentin (NEURONTIN) 100 MG capsule 30 capsule 3    Sig: 1 cap PO every other day   insulin aspart (NOVOLOG FLEXPEN) 100 UNIT/ML FlexPen 15 mL 3    Sig: 7 units subcut with meals   insulin glargine (LANTUS SOLOSTAR) 100 UNIT/ML Solostar Pen 15 mL 6    Sig: Inject 30 Units into the skin daily.    Return in about 6 weeks (around 04/09/2021) for Appt with Lurena Joiner in 1-2days to be taught how to do Dexcom and med reconcilation.  Karle Plumber, MD, FACP

## 2021-03-11 ENCOUNTER — Telehealth: Payer: Self-pay | Admitting: Internal Medicine

## 2021-03-11 NOTE — Telephone Encounter (Signed)
Copied from [place] [id number]. Topic: General - Other ?>> Mar 08, 2021  1:51 PM Tessa Lerner A wrote: ?Reason for CRM: The patient would like to speak with Dr. Lurena Joiner when possible ? ?The patient has questions continuous glucose monitor  ? ?The patient was directed by their PCP to get in contact with Dr. Lurena Joiner  ? ?Please contact further when available ?

## 2021-03-18 LAB — HM DIABETES EYE EXAM

## 2021-03-19 NOTE — Telephone Encounter (Signed)
Pt placed on my schedule to assist her in Dexcom placement.  ?

## 2021-03-20 ENCOUNTER — Telehealth: Payer: Self-pay | Admitting: Internal Medicine

## 2021-03-20 NOTE — Telephone Encounter (Signed)
Copied from [place] [phone]. Topic: General - Other >> Mar 20, 2021 11:20 AM Loma [place] wrote: [facility] is sending over a request for labs with notes and release form signed. Per [employer]

## 2021-03-21 ENCOUNTER — Ambulatory Visit: Payer: Medicare Other | Admitting: Pharmacist

## 2021-03-25 NOTE — Telephone Encounter (Signed)
Provider sent pt a MyChart message on 2/21 reminding her that when she come see Lurena Joiner for [facility] she will need to get labs done as well. Pt had an appt with Lurena Joiner for 03/21/21 and pt no showed. Pt is rescheduled to see Lurena Joiner on 04/15/21. Pt will get labs done then  ?

## 2021-03-26 ENCOUNTER — Telehealth: Payer: Self-pay | Admitting: Internal Medicine

## 2021-03-26 NOTE — Telephone Encounter (Signed)
Copied from [place] [id number]. Topic: Referral - Request for Referral ?>> Mar 25, 2021  8:54 AM Rayann Heman wrote: ? ?Has patient seen PCP for this complaint? Yes.   ?*If NO, is insurance requiring patient see PCP for this issue before PCP can refer them? ?Referral for which specialtyGI  ?Preferred provider/office: Does not care  ?Reason for referral:colonoscopy ?

## 2021-03-27 ENCOUNTER — Other Ambulatory Visit: Payer: Self-pay

## 2021-03-27 ENCOUNTER — Telehealth: Payer: Self-pay | Admitting: *Deleted

## 2021-03-27 DIAGNOSIS — Z1211 Encounter for screening for malignant neoplasm of colon: Secondary | ICD-10-CM

## 2021-03-27 NOTE — Telephone Encounter (Signed)
Copied from [place] [id number]. Topic: Medical Record Request - Other ?>> Mar 27, 2021 11:14 AM Tessa Lerner A wrote: ?Hima with [employer] has called for an update on previously requested information related to the patient's [facility] labs  ? ?Please fax the requested information to [phone]  ? ?Please contact further when possible ?

## 2021-03-27 NOTE — Telephone Encounter (Signed)
Refer back to telephone encounter 03/20/21. ? ?Provider sent pt a MyChart message on 2/21 reminding her that when she come see Lurena Joiner for [facility] she will need to get labs done as well. Pt had an appt with Lurena Joiner for 03/21/21 and pt no showed. Pt is rescheduled to see Lurena Joiner on 04/15/21. Pt will get labs done then  ? ?Tried reaching out to pt to get an appt for lab pt didn't answer  ? ? ?

## 2021-03-27 NOTE — Telephone Encounter (Signed)
Referral has been placed. 

## 2021-03-28 ENCOUNTER — Other Ambulatory Visit: Payer: Self-pay

## 2021-03-28 ENCOUNTER — Emergency Department (HOSPITAL_COMMUNITY)
Admission: EM | Admit: 2021-03-28 | Discharge: 2021-03-28 | Disposition: A | Discharge status: Home / Self Care | Payer: Medicare Other | Attending: Emergency Medicine | Admitting: Emergency Medicine

## 2021-03-28 ENCOUNTER — Emergency Department (HOSPITAL_COMMUNITY): Payer: Medicare Other

## 2021-03-28 ENCOUNTER — Encounter (HOSPITAL_COMMUNITY): Payer: Self-pay

## 2021-03-28 DIAGNOSIS — E860 Dehydration: Secondary | ICD-10-CM | POA: Insufficient documentation

## 2021-03-28 DIAGNOSIS — E1122 Type 2 diabetes mellitus with diabetic chronic kidney disease: Secondary | ICD-10-CM | POA: Diagnosis not present

## 2021-03-28 DIAGNOSIS — Z794 Long term (current) use of insulin: Secondary | ICD-10-CM | POA: Insufficient documentation

## 2021-03-28 DIAGNOSIS — R531 Weakness: Secondary | ICD-10-CM

## 2021-03-28 DIAGNOSIS — R0981 Nasal congestion: Secondary | ICD-10-CM | POA: Diagnosis not present

## 2021-03-28 DIAGNOSIS — E1165 Type 2 diabetes mellitus with hyperglycemia: Secondary | ICD-10-CM | POA: Diagnosis not present

## 2021-03-28 DIAGNOSIS — R0989 Other specified symptoms and signs involving the circulatory and respiratory systems: Secondary | ICD-10-CM | POA: Insufficient documentation

## 2021-03-28 DIAGNOSIS — R5383 Other fatigue: Secondary | ICD-10-CM | POA: Diagnosis present

## 2021-03-28 DIAGNOSIS — R059 Cough, unspecified: Secondary | ICD-10-CM | POA: Insufficient documentation

## 2021-03-28 DIAGNOSIS — R739 Hyperglycemia, unspecified: Secondary | ICD-10-CM

## 2021-03-28 DIAGNOSIS — Z20822 Contact with and (suspected) exposure to covid-19: Secondary | ICD-10-CM | POA: Insufficient documentation

## 2021-03-28 DIAGNOSIS — Z992 Dependence on renal dialysis: Secondary | ICD-10-CM | POA: Diagnosis not present

## 2021-03-28 DIAGNOSIS — N186 End stage renal disease: Secondary | ICD-10-CM | POA: Insufficient documentation

## 2021-03-28 LAB — CBG MONITORING, ED
Glucose-Capillary: 496 mg/dL — ABNORMAL HIGH (ref 70–99)
Glucose-Capillary: 387 mg/dL — ABNORMAL HIGH (ref 70–99)
Glucose-Capillary: 304 mg/dL — ABNORMAL HIGH (ref 70–99)
Glucose-Capillary: 155 mg/dL — ABNORMAL HIGH (ref 70–99)

## 2021-03-28 LAB — COMPREHENSIVE METABOLIC PANEL
ALT: 24 U/L (ref 0–44)
AST: 17 U/L (ref 15–41)
Albumin: 1.7 g/dL — ABNORMAL LOW (ref 3.5–5.0)
Alkaline Phosphatase: 135 U/L — ABNORMAL HIGH (ref 38–126)
Anion gap: 19 — ABNORMAL HIGH (ref 5–15)
BUN: 108 mg/dL — ABNORMAL HIGH (ref 6–20)
CO2: 18 mmol/L — ABNORMAL LOW (ref 22–32)
Calcium: 7.7 mg/dL — ABNORMAL LOW (ref 8.9–10.3)
Chloride: 96 mmol/L — ABNORMAL LOW (ref 98–111)
Creatinine, Ser: 15.17 mg/dL — ABNORMAL HIGH (ref 0.44–1.00)
GFR, Estimated: 2 mL/min — ABNORMAL LOW (ref >60)
Glucose, Bld: 457 mg/dL — ABNORMAL HIGH (ref 70–99)
Potassium: 4.6 mmol/L (ref 3.5–5.1)
Sodium: 133 mmol/L — ABNORMAL LOW (ref 135–145)
Total Bilirubin: 0.5 mg/dL (ref 0.3–1.2)
Total Protein: 4 g/dL — ABNORMAL LOW (ref 6.5–8.1)

## 2021-03-28 LAB — I-STAT CHEM 8, ED
BUN: 108 mg/dL — ABNORMAL HIGH (ref 6–20)
Calcium, Ion: 0.91 mmol/L — ABNORMAL LOW (ref 1.15–1.40)
Chloride: 95 mmol/L — ABNORMAL LOW (ref 98–111)
Creatinine, Ser: 15 mg/dL — ABNORMAL HIGH (ref 0.44–1.00)
Glucose, Bld: 459 mg/dL — ABNORMAL HIGH (ref 70–99)
HCT: 31 % — ABNORMAL LOW (ref 36.0–46.0)
Hemoglobin: 10.5 g/dL — ABNORMAL LOW (ref 12.0–15.0)
Potassium: 4.5 mmol/L (ref 3.5–5.1)
Sodium: 129 mmol/L — ABNORMAL LOW (ref 135–145)
TCO2: 21 mmol/L — ABNORMAL LOW (ref 22–32)

## 2021-03-28 LAB — CBC WITH DIFFERENTIAL/PLATELET
Abs Immature Granulocytes: 0.12 10*3/uL — ABNORMAL HIGH (ref 0.00–0.07)
Basophils Absolute: 0 10*3/uL (ref 0.0–0.1)
Basophils Relative: 0 %
Eosinophils Absolute: 0 10*3/uL (ref 0.0–0.5)
Eosinophils Relative: 0 %
HCT: 31.4 % — ABNORMAL LOW (ref 36.0–46.0)
Hemoglobin: 10.3 g/dL — ABNORMAL LOW (ref 12.0–15.0)
Immature Granulocytes: 1 %
Lymphocytes Relative: 8 %
Lymphs Abs: 1 10*3/uL (ref 0.7–4.0)
MCH: 29.3 pg (ref 26.0–34.0)
MCHC: 32.8 g/dL (ref 30.0–36.0)
MCV: 89.5 fL (ref 80.0–100.0)
Monocytes Absolute: 1.5 10*3/uL — ABNORMAL HIGH (ref 0.1–1.0)
Monocytes Relative: 12 %
Neutro Abs: 10.4 10*3/uL — ABNORMAL HIGH (ref 1.7–7.7)
Neutrophils Relative %: 79 %
Platelets: 284 10*3/uL (ref 150–400)
RBC: 3.51 MIL/uL — ABNORMAL LOW (ref 3.87–5.11)
RDW: 14.1 % (ref 11.5–15.5)
WBC: 13.1 10*3/uL — ABNORMAL HIGH (ref 4.0–10.5)
nRBC: 0 % (ref 0.0–0.2)

## 2021-03-28 LAB — I-STAT VENOUS BLOOD GAS, ED
Acid-base deficit: 3 mmol/L — ABNORMAL HIGH (ref 0.0–2.0)
Bicarbonate: 21.3 mmol/L (ref 20.0–28.0)
Calcium, Ion: 0.94 mmol/L — ABNORMAL LOW (ref 1.15–1.40)
HCT: 29 % — ABNORMAL LOW (ref 36.0–46.0)
Hemoglobin: 9.9 g/dL — ABNORMAL LOW (ref 12.0–15.0)
O2 Saturation: 100 %
Potassium: 4.6 mmol/L (ref 3.5–5.1)
Sodium: 128 mmol/L — ABNORMAL LOW (ref 135–145)
TCO2: 22 mmol/L (ref 22–32)
pCO2, Ven: 33.2 mmHg — ABNORMAL LOW (ref 44–60)
pH, Ven: 7.415 (ref 7.25–7.43)
pO2, Ven: 169 mmHg — ABNORMAL HIGH (ref 32–45)

## 2021-03-28 LAB — RESP PANEL BY RT-PCR (FLU A&B, COVID) ARPGX2
Influenza A by PCR: NEGATIVE
Influenza B by PCR: NEGATIVE
SARS Coronavirus 2 by RT PCR: NEGATIVE

## 2021-03-28 MED ORDER — SODIUM CHLORIDE 0.9 % IV BOLUS: 1000.0000 mL | Freq: Once | INTRAVENOUS | Status: DC

## 2021-03-28 MED ORDER — INSULIN ASPART 100 UNIT/ML IJ SOLN: 5.0000 [IU] | Freq: Once | INTRAMUSCULAR | Status: DC

## 2021-03-28 MED ORDER — SODIUM CHLORIDE 0.9 % IV BOLUS
500.0000 mL | Freq: Once | INTRAVENOUS | Status: AC
Start: 2021-03-28 — End: 2021-03-28
  Administered 2021-03-28: 500 mL via INTRAVENOUS

## 2021-03-28 MED ORDER — INSULIN ASPART 100 UNIT/ML IJ SOLN
10.0000 [IU] | Freq: Once | INTRAMUSCULAR | Status: AC
  Administered 2021-03-28: 10 [IU] via INTRAVENOUS

## 2021-03-28 MED ORDER — ONDANSETRON HCL 4 MG/2ML IJ SOLN
4.0000 mg | Freq: Once | INTRAMUSCULAR | Status: AC
  Administered 2021-03-28: 4 mg via INTRAVENOUS
  Filled 2021-03-28: qty 2

## 2021-03-28 MED ORDER — INSULIN ASPART 100 UNIT/ML IJ SOLN
5.0000 [IU] | Freq: Once | INTRAMUSCULAR | Status: AC
Start: 2021-03-28 — End: 2021-03-28
  Administered 2021-03-28: 5 [IU] via SUBCUTANEOUS

## 2021-03-28 NOTE — ED Notes (Signed)
Patient transported to X-ray 

## 2021-03-28 NOTE — Discharge Instructions (Signed)
You were evaluated in the Emergency Department and after careful evaluation, we did not find any emergent condition requiring admission or further testing in the hospital. ? ?Your exam/testing today showed hyperglycemia.  You were given insulin and your blood sugars improved.  Recommending close follow-up with your primary care provider to ensure that your diabetes is appropriately managed.  Please return if you get any confusion or fevers. ? ?Please return to the Emergency Department if you experience any worsening of your condition.  Thank you for allowing [place] to be a part of your care.  ?

## 2021-03-28 NOTE — ED Notes (Signed)
EDP approved pt to take home medications at the bedside. EDP informed pt to hold off on her BP medication at this time.  ? ?Pt took one 100 mg Gabapentin for foot pain. ?

## 2021-03-28 NOTE — Telephone Encounter (Signed)
Correction labs were done 01/16/21. Labs has been faxed  ?

## 2021-03-28 NOTE — ED Triage Notes (Signed)
Pt bib ems from dialysis. Pt completes HD at home every night. Pt states she has to go to dialysis twice a month for a check up. It was noted pt has been weak and lethargic that has progressed since last Tuesday. Pt endorses low intake, cough, and runny nose. Pt hasn't taken her insulin in a couple of days d/t poor intake. EMS noted pt CBG 499. ? ? ?BP 134/68 ?HR 80 ?RA 99 ? ?Left arm restricted.  ?

## 2021-03-28 NOTE — ED Provider Notes (Signed)
?[facility] ?Provider Note ? ? ?CSN: [id number] ?Arrival date & time: 03/28/21  1306 ? ?  ? ?History ? ?Chief Complaint  ?Patient presents with  ? Weakness  ? ? ?Ann Bryan is a 61 y.o. female. ? ? ?Weakness ?Patient is a 61 year old female presented emergency room today with complaints of feeling weak fatigued and tired since 3 days ago.  She states that prior to this she is feeling relatively well.  She denies any chest pain difficulty breathing no fevers she states that she has some congestion and some pain in her nostrils.  She also endorses occasional coughing no purulent sputum production.  No fevers no hemoptysis. ? ?She denies any urinary frequency urgency dysuria hematuria.  Seems that she does not make urine very often anymore. ? ?She is a peritoneal dialysis patient she has had no issues of doing her dialysis at home and she states that she last ate yesterday. ? ?She is also been quite nauseous.  This is prompted her to stop using her insulin over the past few days. ?  ? ?Home Medications ?Prior to Admission medications   ?Medication Sig Start Date End Date Taking? Authorizing Provider  ?amLODipine (NORVASC) 10 MG tablet Take 1 tablet (10 mg total) by mouth daily. Must have office visit for refills 01/03/20   Charlott Rakes, MD  ?atorvastatin (LIPITOR) 20 MG tablet Take 1 tablet by mouth daily. 11/30/19   Ladell Pier, MD  ?Blood Glucose Monitoring Suppl (TRUE METRIX METER) w/Device KIT 1 each by Does not apply route as needed. ?Patient not taking: Reported on 02/29/2020 11/18/18   Ladell Pier, MD  ?calcium acetate (PHOSLO) 667 MG capsule Take 667-2,001 mg by mouth See admin instructions. 2,001 mg with meals 3 times daily and 667 mg 2 times daily with snacks    [provider]  ?carvedilol (COREG) 25 MG tablet Take 1 and 1/2 tablets by mouth twice daily with a meal. Must have office visit for refills 12/26/19   Ladell Pier, MD   ?cinacalcet (SENSIPAR) 60 MG tablet Take 60 mg by mouth daily. 03/22/19   [provider]  ?cloNIDine (CATAPRES) 0.2 MG tablet Take 0.2 mg by mouth 2 (two) times daily. 06/27/20   [provider]  ?Continuous Blood Gluc Receiver (DEXCOM G6 RECEIVER) DEVI Use as instructed to check blood sugar daily 03/29/20   Shamleffer, Melanie Crazier, MD  ?Continuous Blood Gluc Sensor (DEXCOM G6 SENSOR) MISC 1 Device by Does not apply route as directed. 02/29/20   Shamleffer, Melanie Crazier, MD  ?Continuous Blood Gluc Transmit (DEXCOM G6 TRANSMITTER) MISC 1 Device by Does not apply route as directed. 02/29/20   Shamleffer, Melanie Crazier, MD  ?gabapentin (NEURONTIN) 100 MG capsule 1 cap PO every other day 02/26/21   Ladell Pier, MD  ?glucose blood [facility] VERIO) test strip Use as instructed to check blood sugar 3 times daily Dx is E11.65 03/23/20   Shamleffer, Melanie Crazier, MD  ?insulin aspart (NOVOLOG FLEXPEN) 100 UNIT/ML FlexPen 7 units subcut with meals 02/26/21   Ladell Pier, MD  ?insulin glargine (LANTUS SOLOSTAR) 100 UNIT/ML Solostar Pen Inject 30 Units into the skin daily. 02/26/21   Ladell Pier, MD  ?Insulin Pen Needle 32G X 4 MM MISC 1 Device by Does not apply route in the morning, at noon, in the evening, and at bedtime. 08/31/20   Shamleffer, Melanie Crazier, MD  ?Insulin Syringe-Needle U-100 (INSULIN SYRINGE 1CC/31GX5/16") 31G X 5/16" 1  ML MISC Use as directed.  BD 76m x 31G needles 01/22/17   JLadell Pier MD  ?Insulin Syringes, Disposable, U-100 0.5 ML MISC 1 each by Does not apply route 2 (two) times daily. 01/30/14   Funches, JAdriana Mccallum MD  ?TRUEplus Lancets 28G MISC 1 each by Does not apply route 3 (three) times daily. 12/20/20   Shamleffer, IMelanie Crazier MD  ?   ? ?Allergies    ?Biopatch protective disk-chg [chlorhexidine], Doxycycline, and Victoza [liraglutide]   ? ?Review of Systems   ?Review of Systems  ?Neurological:  Positive for weakness.  ? ?Physical Exam ?Updated  Vital Signs ?BP 127/74 (BP Location: Right Arm)   Pulse 77   Temp 98.3 ?F (36.8 ?C)   Resp 12   Ht 5' (1.524 m)   Wt 64.4 kg   LMP 03/13/2002   SpO2 97%   BMI 27.73 kg/m?  ?Physical Exam ?Vitals and nursing note reviewed.  ?Constitutional:   ?   General: She is not in acute distress. ?   Appearance: She is obese.  ?   Comments: Chronically ill-appearing 61year old female nontoxic-appearing.  ?HENT:  ?   Head: Normocephalic and atraumatic.  ?   Nose: Nose normal.  ?Eyes:  ?   General: No scleral icterus. ?Cardiovascular:  ?   Rate and Rhythm: Normal rate and regular rhythm.  ?   Pulses: Normal pulses.  ?   Heart sounds: Normal heart sounds.  ?Pulmonary:  ?   Effort: Pulmonary effort is normal. No respiratory distress.  ?   Breath sounds: No wheezing.  ?Abdominal:  ?   Palpations: Abdomen is soft.  ?   Tenderness: There is no abdominal tenderness. There is no guarding or rebound.  ?   Comments: Peritoneal dialysis catheter to right lower abdomen.  No evidence of surrounding cellulitis nontender.  ?Musculoskeletal:  ?   Cervical back: Normal range of motion.  ?   Right lower leg: No edema.  ?   Left lower leg: No edema.  ?Skin: ?   General: Skin is warm and dry.  ?   Capillary Refill: Capillary refill takes less than 2 seconds.  ?Neurological:  ?   Mental Status: She is alert. Mental status is at baseline.  ?Psychiatric:     ?   Mood and Affect: Mood normal.     ?   Behavior: Behavior normal.  ? ? ?ED Results / Procedures / Treatments   ?Labs ?(all labs ordered are listed, but only abnormal results are displayed) ?Labs Reviewed  ?CBG MONITORING, ED - Abnormal; Notable for the following components:  ?    Result Value  ? Glucose-Capillary 496 (*)   ? All other components within normal limits  ?I-STAT CHEM 8, ED - Abnormal; Notable for the following components:  ? Sodium 129 (*)   ? Chloride 95 (*)   ? BUN 108 (*)   ? Creatinine, Ser 15.00 (*)   ? Glucose, Bld 459 (*)   ? Calcium, Ion 0.91 (*)   ? TCO2 21 (*)   ?  Hemoglobin 10.5 (*)   ? HCT 31.0 (*)   ? All other components within normal limits  ?I-STAT VENOUS BLOOD GAS, ED - Abnormal; Notable for the following components:  ? pCO2, Ven 33.2 (*)   ? pO2, Ven 169 (*)   ? Acid-base deficit 3.0 (*)   ? Sodium 128 (*)   ? Calcium, Ion 0.94 (*)   ? HCT 29.0 (*)   ? Hemoglobin  9.9 (*)   ? All other components within normal limits  ?RESP PANEL BY RT-PCR (FLU A&B, COVID) ARPGX2  ?URINALYSIS, ROUTINE W REFLEX MICROSCOPIC  ?BLOOD GAS, VENOUS  ?CBC WITH DIFFERENTIAL/PLATELET  ?COMPREHENSIVE METABOLIC PANEL  ? ? ?EKG ?EKG Interpretation ? ?Date/Time:  Thursday March 28 2021 13:13:29 EDT ?Ventricular Rate:  78 ?PR Interval:  134 ?QRS Duration: 103 ?QT Interval:  460 ?QTC Calculation: 524 ?R Axis:   21 ?Text Interpretation: Sinus rhythm Left ventricular hypertrophy ST elevation, consider anterior injury Prolonged QT interval New since previous tracing Confirmed by Fredia Sorrow [phone]) on 03/28/2021 3:03:22 PM ? ?Radiology ?DG Chest 2 View ? ?Result Date: 03/28/2021 ?CLINICAL DATA:  Cough. EXAM: CHEST - 2 VIEW COMPARISON:  August 20, 2020. FINDINGS: Stable cardiomegaly. Stable elevated right hemidiaphragm is noted. Both lungs are clear. The visualized skeletal structures are unremarkable. IMPRESSION: No active cardiopulmonary disease. Electronically Signed   By: Marijo Conception M.D.   On: 03/28/2021 14:29   ? ?Procedures ?Procedures  ? ? ?Medications Ordered in ED ?Medications  ?insulin aspart (novoLOG) injection 5 Units (has no administration in time range)  ?ondansetron (ZOFRAN) injection 4 mg (4 mg Intravenous Given 03/28/21 1537)  ? ? ?ED Course/ Medical Decision Making/ A&P ?Clinical Course as of 03/28/21 1601  ?Thu Mar 28, 2021  ?1323 Woke up Tuesday morning feeling weak and "out of it" ?No CP or SOB ?Pain in nose and congested. Some coughing.  [WF]  ?[place] dialysis peritoneal - 3 hours [WF]  ?89 DMII, ESRD on PD at home. Increased fatigue, nausea. Stopped using insulin a few  days ago because not eating and drinking. 500 cc Fluids. 5u of subq insulin. Cough, congestion, and runny nose. COVID not obtained. CXR unremarkable.  [RK]  ?  ?Clinical Course User Index ?[RK] Lupita Dawn, MD ?[WF]

## 2021-03-28 NOTE — ED Provider Notes (Signed)
?  Physical Exam  ?BP 127/74 (BP Location: Right Arm)   Pulse 77   Temp 98.3 ?F (36.8 ?C)   Resp 12   Ht 5' (1.524 m)   Wt 64.4 kg   LMP 03/13/2002   SpO2 97%   BMI 27.73 kg/m?  ? ?Physical Exam ?Constitutional:   ?   Appearance: She is not ill-appearing.  ?Cardiovascular:  ?   Rate and Rhythm: Normal rate and regular rhythm.  ?Neurological:  ?   Mental Status: She is alert.  ? ? ?Procedures  ?Procedures ? ?ED Course / MDM  ? ?Clinical Course as of 03/29/21 0005  ?Thu Mar 28, 2021  ?   ?   ?53 DMII, ESRD on PD at home. Increased fatigue, nausea. Stopped using insulin a few days ago because not eating and drinking. 500 cc Fluids. 5u of subq insulin. Cough, congestion, and runny nose. COVID not obtained. CXR unremarkable.  [RK]  ?1948 Given 10u of insulin. Repeat BG 304.  [RK]  ?2012 Repeat BG 155.  [RK]  ?  ?Clinical Course User Index ?[RK] Lupita Dawn, MD ?[WF] Tedd Sias, [place]  ? ?Medical Decision Making ?Amount and/or Complexity of Data Reviewed ?Labs: ordered. ?Radiology: ordered. ? ?Risk ?Prescription drug management. ? ? ?Patient safe for discharge home at this time.  Blood sugars have improved.  Patient given strict return precautions.  Recommending she follow-up with her PCP in the next day or 2.  Patient verbalized agreement understanding plan. ? ?Patient seen in conjunction my attending Dr. Darl Householder. ? ? ? ? ?  ?Lupita Dawn, MD ?03/29/21 0006 ? ?  ?Drenda Freeze, MD ?03/29/21 2350 ? ?

## 2021-03-28 NOTE — ED Notes (Signed)
Pt is hard stick. RN attempted 2x unsuccessful. IV team consulted ?

## 2021-03-28 NOTE — ED Notes (Signed)
Upon entering room pt BP cuff was on the floor. RN applied BP cuff and reassessed BP. ?

## 2021-03-28 NOTE — ED Notes (Signed)
IV team at bedside 

## 2021-04-03 ENCOUNTER — Telehealth: Payer: Self-pay | Admitting: Internal Medicine

## 2021-04-03 NOTE — Telephone Encounter (Signed)
Copied from [place] [id number]. Topic: General - Other >> Apr 03, 2021  1:05 PM Camille Bal, [place] wrote: Reason for CRM:pt called in states having problem with doing colarguard, says was told  it hsa to be a certain texture, and it isnt, so she asking to see another dr instead of doing the colorguard. She says she didn't have anyone to bring her in before to do the colarguard but now she does. Please call back.

## 2021-04-04 NOTE — Telephone Encounter (Signed)
Returned pt call and left a detailed vm making pt aware that referral was placed on 3/22. Provided referral information and if she has any questions or concerns to give [place] a call  ? ?Sent Referral to [facility] Ph# [phone] #[phone] ?[place] [address] ?

## 2021-04-15 ENCOUNTER — Ambulatory Visit: Payer: Medicare Other | Admitting: Pharmacist

## 2021-04-16 ENCOUNTER — Other Ambulatory Visit: Payer: Self-pay

## 2021-04-16 ENCOUNTER — Encounter (HOSPITAL_COMMUNITY): Payer: Self-pay | Admitting: Emergency Medicine

## 2021-04-16 ENCOUNTER — Inpatient Hospital Stay (HOSPITAL_COMMUNITY): Payer: Medicare Other

## 2021-04-16 ENCOUNTER — Emergency Department (HOSPITAL_COMMUNITY): Payer: Medicare Other

## 2021-04-16 ENCOUNTER — Inpatient Hospital Stay (HOSPITAL_COMMUNITY)
Admission: EM | Admit: 2021-04-16 | Discharge: 2021-05-06 | DRG: 252 | Disposition: E | Payer: Medicare Other | Source: Other Acute Inpatient Hospital | Attending: Internal Medicine | Admitting: Internal Medicine

## 2021-04-16 DIAGNOSIS — Z881 Allergy status to other antibiotic agents status: Secondary | ICD-10-CM

## 2021-04-16 DIAGNOSIS — R55 Syncope and collapse: Secondary | ICD-10-CM | POA: Diagnosis not present

## 2021-04-16 DIAGNOSIS — I471 Supraventricular tachycardia: Secondary | ICD-10-CM | POA: Diagnosis not present

## 2021-04-16 DIAGNOSIS — I10 Essential (primary) hypertension: Secondary | ICD-10-CM | POA: Diagnosis present

## 2021-04-16 DIAGNOSIS — I5022 Chronic systolic (congestive) heart failure: Secondary | ICD-10-CM

## 2021-04-16 DIAGNOSIS — Z818 Family history of other mental and behavioral disorders: Secondary | ICD-10-CM

## 2021-04-16 DIAGNOSIS — Z7189 Other specified counseling: Secondary | ICD-10-CM | POA: Diagnosis not present

## 2021-04-16 DIAGNOSIS — Z90710 Acquired absence of both cervix and uterus: Secondary | ICD-10-CM

## 2021-04-16 DIAGNOSIS — Z66 Do not resuscitate: Secondary | ICD-10-CM | POA: Diagnosis not present

## 2021-04-16 DIAGNOSIS — I63232 Cerebral infarction due to unspecified occlusion or stenosis of left carotid arteries: Secondary | ICD-10-CM | POA: Diagnosis not present

## 2021-04-16 DIAGNOSIS — I5023 Acute on chronic systolic (congestive) heart failure: Secondary | ICD-10-CM | POA: Diagnosis present

## 2021-04-16 DIAGNOSIS — W1830XA Fall on same level, unspecified, initial encounter: Secondary | ICD-10-CM | POA: Diagnosis present

## 2021-04-16 DIAGNOSIS — S40012A Contusion of left shoulder, initial encounter: Secondary | ICD-10-CM | POA: Diagnosis not present

## 2021-04-16 DIAGNOSIS — Y832 Surgical operation with anastomosis, bypass or graft as the cause of abnormal reaction of the patient, or of later complication, without mention of misadventure at the time of the procedure: Secondary | ICD-10-CM | POA: Diagnosis not present

## 2021-04-16 DIAGNOSIS — E1122 Type 2 diabetes mellitus with diabetic chronic kidney disease: Secondary | ICD-10-CM | POA: Diagnosis present

## 2021-04-16 DIAGNOSIS — J9601 Acute respiratory failure with hypoxia: Secondary | ICD-10-CM | POA: Diagnosis not present

## 2021-04-16 DIAGNOSIS — E440 Moderate protein-calorie malnutrition: Secondary | ICD-10-CM | POA: Diagnosis present

## 2021-04-16 DIAGNOSIS — M79671 Pain in right foot: Secondary | ICD-10-CM | POA: Diagnosis not present

## 2021-04-16 DIAGNOSIS — Z833 Family history of diabetes mellitus: Secondary | ICD-10-CM

## 2021-04-16 DIAGNOSIS — I214 Non-ST elevation (NSTEMI) myocardial infarction: Secondary | ICD-10-CM | POA: Diagnosis present

## 2021-04-16 DIAGNOSIS — Z4889 Encounter for other specified surgical aftercare: Secondary | ICD-10-CM

## 2021-04-16 DIAGNOSIS — H409 Unspecified glaucoma: Secondary | ICD-10-CM | POA: Diagnosis present

## 2021-04-16 DIAGNOSIS — N186 End stage renal disease: Secondary | ICD-10-CM | POA: Diagnosis present

## 2021-04-16 DIAGNOSIS — Z992 Dependence on renal dialysis: Secondary | ICD-10-CM | POA: Diagnosis not present

## 2021-04-16 DIAGNOSIS — Z888 Allergy status to other drugs, medicaments and biological substances status: Secondary | ICD-10-CM

## 2021-04-16 DIAGNOSIS — Z794 Long term (current) use of insulin: Secondary | ICD-10-CM

## 2021-04-16 DIAGNOSIS — Z6826 Body mass index (BMI) 26.0-26.9, adult: Secondary | ICD-10-CM

## 2021-04-16 DIAGNOSIS — Z823 Family history of stroke: Secondary | ICD-10-CM

## 2021-04-16 DIAGNOSIS — E872 Acidosis, unspecified: Secondary | ICD-10-CM | POA: Diagnosis present

## 2021-04-16 DIAGNOSIS — I2510 Atherosclerotic heart disease of native coronary artery without angina pectoris: Secondary | ICD-10-CM | POA: Diagnosis not present

## 2021-04-16 DIAGNOSIS — R296 Repeated falls: Secondary | ICD-10-CM | POA: Diagnosis not present

## 2021-04-16 DIAGNOSIS — G931 Anoxic brain damage, not elsewhere classified: Secondary | ICD-10-CM | POA: Diagnosis not present

## 2021-04-16 DIAGNOSIS — I428 Other cardiomyopathies: Secondary | ICD-10-CM | POA: Diagnosis present

## 2021-04-16 DIAGNOSIS — I70209 Unspecified atherosclerosis of native arteries of extremities, unspecified extremity: Secondary | ICD-10-CM | POA: Diagnosis present

## 2021-04-16 DIAGNOSIS — I272 Pulmonary hypertension, unspecified: Secondary | ICD-10-CM | POA: Diagnosis present

## 2021-04-16 DIAGNOSIS — R7989 Other specified abnormal findings of blood chemistry: Secondary | ICD-10-CM | POA: Diagnosis not present

## 2021-04-16 DIAGNOSIS — I5021 Acute systolic (congestive) heart failure: Secondary | ICD-10-CM | POA: Insufficient documentation

## 2021-04-16 DIAGNOSIS — N2581 Secondary hyperparathyroidism of renal origin: Secondary | ICD-10-CM | POA: Diagnosis present

## 2021-04-16 DIAGNOSIS — I255 Ischemic cardiomyopathy: Secondary | ICD-10-CM | POA: Diagnosis present

## 2021-04-16 DIAGNOSIS — I442 Atrioventricular block, complete: Secondary | ICD-10-CM | POA: Diagnosis not present

## 2021-04-16 DIAGNOSIS — R001 Bradycardia, unspecified: Secondary | ICD-10-CM

## 2021-04-16 DIAGNOSIS — T85611A Breakdown (mechanical) of intraperitoneal dialysis catheter, initial encounter: Secondary | ICD-10-CM | POA: Diagnosis not present

## 2021-04-16 DIAGNOSIS — T82818A Embolism of vascular prosthetic devices, implants and grafts, initial encounter: Secondary | ICD-10-CM | POA: Diagnosis not present

## 2021-04-16 DIAGNOSIS — Z515 Encounter for palliative care: Secondary | ICD-10-CM | POA: Diagnosis not present

## 2021-04-16 DIAGNOSIS — L7632 Postprocedural hematoma of skin and subcutaneous tissue following other procedure: Secondary | ICD-10-CM | POA: Diagnosis not present

## 2021-04-16 DIAGNOSIS — D631 Anemia in chronic kidney disease: Secondary | ICD-10-CM | POA: Diagnosis present

## 2021-04-16 DIAGNOSIS — M6282 Rhabdomyolysis: Secondary | ICD-10-CM | POA: Diagnosis not present

## 2021-04-16 DIAGNOSIS — R21 Rash and other nonspecific skin eruption: Secondary | ICD-10-CM | POA: Diagnosis present

## 2021-04-16 DIAGNOSIS — I25119 Atherosclerotic heart disease of native coronary artery with unspecified angina pectoris: Secondary | ICD-10-CM | POA: Diagnosis not present

## 2021-04-16 DIAGNOSIS — T68XXXA Hypothermia, initial encounter: Secondary | ICD-10-CM

## 2021-04-16 DIAGNOSIS — I82712 Chronic embolism and thrombosis of superficial veins of left upper extremity: Secondary | ICD-10-CM | POA: Diagnosis present

## 2021-04-16 DIAGNOSIS — I5020 Unspecified systolic (congestive) heart failure: Secondary | ICD-10-CM | POA: Diagnosis not present

## 2021-04-16 DIAGNOSIS — R579 Shock, unspecified: Secondary | ICD-10-CM

## 2021-04-16 DIAGNOSIS — I132 Hypertensive heart and chronic kidney disease with heart failure and with stage 5 chronic kidney disease, or end stage renal disease: Secondary | ICD-10-CM | POA: Diagnosis present

## 2021-04-16 DIAGNOSIS — E785 Hyperlipidemia, unspecified: Secondary | ICD-10-CM | POA: Diagnosis present

## 2021-04-16 DIAGNOSIS — E11649 Type 2 diabetes mellitus with hypoglycemia without coma: Secondary | ICD-10-CM | POA: Diagnosis present

## 2021-04-16 DIAGNOSIS — I953 Hypotension of hemodialysis: Secondary | ICD-10-CM | POA: Diagnosis not present

## 2021-04-16 DIAGNOSIS — R570 Cardiogenic shock: Secondary | ICD-10-CM | POA: Diagnosis present

## 2021-04-16 DIAGNOSIS — I509 Heart failure, unspecified: Secondary | ICD-10-CM | POA: Diagnosis not present

## 2021-04-16 DIAGNOSIS — N189 Chronic kidney disease, unspecified: Secondary | ICD-10-CM | POA: Diagnosis not present

## 2021-04-16 DIAGNOSIS — I2584 Coronary atherosclerosis due to calcified coronary lesion: Secondary | ICD-10-CM | POA: Diagnosis not present

## 2021-04-16 DIAGNOSIS — I462 Cardiac arrest due to underlying cardiac condition: Secondary | ICD-10-CM | POA: Diagnosis not present

## 2021-04-16 DIAGNOSIS — R680 Hypothermia, not associated with low environmental temperature: Secondary | ICD-10-CM | POA: Diagnosis present

## 2021-04-16 DIAGNOSIS — I97638 Postprocedural hematoma of a circulatory system organ or structure following other circulatory system procedure: Secondary | ICD-10-CM | POA: Diagnosis not present

## 2021-04-16 DIAGNOSIS — I5084 End stage heart failure: Secondary | ICD-10-CM | POA: Diagnosis present

## 2021-04-16 DIAGNOSIS — Z20822 Contact with and (suspected) exposure to covid-19: Secondary | ICD-10-CM | POA: Diagnosis present

## 2021-04-16 DIAGNOSIS — R627 Adult failure to thrive: Secondary | ICD-10-CM | POA: Diagnosis not present

## 2021-04-16 DIAGNOSIS — Z8673 Personal history of transient ischemic attack (TIA), and cerebral infarction without residual deficits: Secondary | ICD-10-CM

## 2021-04-16 DIAGNOSIS — G5793 Unspecified mononeuropathy of bilateral lower limbs: Secondary | ICD-10-CM | POA: Diagnosis present

## 2021-04-16 DIAGNOSIS — J9600 Acute respiratory failure, unspecified whether with hypoxia or hypercapnia: Secondary | ICD-10-CM

## 2021-04-16 DIAGNOSIS — M6281 Muscle weakness (generalized): Secondary | ICD-10-CM | POA: Diagnosis not present

## 2021-04-16 DIAGNOSIS — T85691A Other mechanical complication of intraperitoneal dialysis catheter, initial encounter: Secondary | ICD-10-CM | POA: Diagnosis not present

## 2021-04-16 DIAGNOSIS — E1165 Type 2 diabetes mellitus with hyperglycemia: Secondary | ICD-10-CM | POA: Diagnosis not present

## 2021-04-16 DIAGNOSIS — Z79899 Other long term (current) drug therapy: Secondary | ICD-10-CM

## 2021-04-16 DIAGNOSIS — E1141 Type 2 diabetes mellitus with diabetic mononeuropathy: Secondary | ICD-10-CM | POA: Diagnosis present

## 2021-04-16 DIAGNOSIS — E860 Dehydration: Secondary | ICD-10-CM | POA: Diagnosis present

## 2021-04-16 DIAGNOSIS — I5082 Biventricular heart failure: Secondary | ICD-10-CM | POA: Diagnosis present

## 2021-04-16 DIAGNOSIS — T82898A Other specified complication of vascular prosthetic devices, implants and grafts, initial encounter: Secondary | ICD-10-CM | POA: Diagnosis not present

## 2021-04-16 DIAGNOSIS — E113599 Type 2 diabetes mellitus with proliferative diabetic retinopathy without macular edema, unspecified eye: Secondary | ICD-10-CM | POA: Diagnosis present

## 2021-04-16 DIAGNOSIS — I2582 Chronic total occlusion of coronary artery: Secondary | ICD-10-CM | POA: Diagnosis present

## 2021-04-16 DIAGNOSIS — I469 Cardiac arrest, cause unspecified: Secondary | ICD-10-CM | POA: Diagnosis not present

## 2021-04-16 DIAGNOSIS — R778 Other specified abnormalities of plasma proteins: Secondary | ICD-10-CM

## 2021-04-16 DIAGNOSIS — Z8249 Family history of ischemic heart disease and other diseases of the circulatory system: Secondary | ICD-10-CM

## 2021-04-16 DIAGNOSIS — I97621 Postprocedural hematoma of a circulatory system organ or structure following other procedure: Secondary | ICD-10-CM | POA: Diagnosis not present

## 2021-04-16 DIAGNOSIS — M79672 Pain in left foot: Secondary | ICD-10-CM | POA: Diagnosis not present

## 2021-04-16 DIAGNOSIS — T82868A Thrombosis of vascular prosthetic devices, implants and grafts, initial encounter: Secondary | ICD-10-CM | POA: Diagnosis not present

## 2021-04-16 DIAGNOSIS — N185 Chronic kidney disease, stage 5: Secondary | ICD-10-CM | POA: Diagnosis not present

## 2021-04-16 DIAGNOSIS — R569 Unspecified convulsions: Secondary | ICD-10-CM | POA: Diagnosis not present

## 2021-04-16 DIAGNOSIS — R609 Edema, unspecified: Secondary | ICD-10-CM | POA: Diagnosis not present

## 2021-04-16 DIAGNOSIS — G5790 Unspecified mononeuropathy of unspecified lower limb: Secondary | ICD-10-CM

## 2021-04-16 LAB — CBG MONITORING, ED
Glucose-Capillary: 119 mg/dL — ABNORMAL HIGH (ref 70–99)
Glucose-Capillary: 18 mg/dL — CL (ref 70–99)
Glucose-Capillary: 105 mg/dL — ABNORMAL HIGH (ref 70–99)
Glucose-Capillary: 254 mg/dL — ABNORMAL HIGH (ref 70–99)
Glucose-Capillary: 137 mg/dL — ABNORMAL HIGH (ref 70–99)

## 2021-04-16 LAB — I-STAT VENOUS BLOOD GAS, ED
Acid-base deficit: 8 mmol/L — ABNORMAL HIGH (ref 0.0–2.0)
Bicarbonate: 15.2 mmol/L — ABNORMAL LOW (ref 20.0–28.0)
Calcium, Ion: 0.63 mmol/L — CL (ref 1.15–1.40)
HCT: 40 % (ref 36.0–46.0)
Hemoglobin: 13.6 g/dL (ref 12.0–15.0)
O2 Saturation: 98 %
Potassium: 4 mmol/L (ref 3.5–5.1)
Sodium: 126 mmol/L — ABNORMAL LOW (ref 135–145)
TCO2: 16 mmol/L — ABNORMAL LOW (ref 22–32)
pCO2, Ven: 26.4 mmHg — ABNORMAL LOW (ref 44–60)
pH, Ven: 7.369 (ref 7.25–7.43)
pO2, Ven: 107 mmHg — ABNORMAL HIGH (ref 32–45)

## 2021-04-16 LAB — COMPREHENSIVE METABOLIC PANEL
ALT: 15 U/L (ref 0–44)
AST: 27 U/L (ref 15–41)
Albumin: 2 g/dL — ABNORMAL LOW (ref 3.5–5.0)
Alkaline Phosphatase: 171 U/L — ABNORMAL HIGH (ref 38–126)
Anion gap: 26 — ABNORMAL HIGH (ref 5–15)
BUN: 114 mg/dL — ABNORMAL HIGH (ref 6–20)
CO2: 13 mmol/L — ABNORMAL LOW (ref 22–32)
Calcium: 6.5 mg/dL — ABNORMAL LOW (ref 8.9–10.3)
Chloride: 93 mmol/L — ABNORMAL LOW (ref 98–111)
Creatinine, Ser: 16.03 mg/dL — ABNORMAL HIGH (ref 0.44–1.00)
GFR, Estimated: 2 mL/min — ABNORMAL LOW (ref >60)
Glucose, Bld: 97 mg/dL (ref 70–99)
Potassium: 4.7 mmol/L (ref 3.5–5.1)
Sodium: 132 mmol/L — ABNORMAL LOW (ref 135–145)
Total Bilirubin: 0.8 mg/dL (ref 0.3–1.2)
Total Protein: 5.3 g/dL — ABNORMAL LOW (ref 6.5–8.1)

## 2021-04-16 LAB — CBC WITH DIFFERENTIAL/PLATELET
Abs Immature Granulocytes: 0.14 10*3/uL — ABNORMAL HIGH (ref 0.00–0.07)
Basophils Absolute: 0 10*3/uL (ref 0.0–0.1)
Basophils Relative: 0 %
Eosinophils Absolute: 0.1 10*3/uL (ref 0.0–0.5)
Eosinophils Relative: 1 %
HCT: 46.5 % — ABNORMAL HIGH (ref 36.0–46.0)
Hemoglobin: 15.8 g/dL — ABNORMAL HIGH (ref 12.0–15.0)
Immature Granulocytes: 1 %
Lymphocytes Relative: 12 %
Lymphs Abs: 1.2 10*3/uL (ref 0.7–4.0)
MCH: 28.9 pg (ref 26.0–34.0)
MCHC: 34 g/dL (ref 30.0–36.0)
MCV: 85.2 fL (ref 80.0–100.0)
Monocytes Absolute: 0.5 10*3/uL (ref 0.1–1.0)
Monocytes Relative: 5 %
Neutro Abs: 8.2 10*3/uL — ABNORMAL HIGH (ref 1.7–7.7)
Neutrophils Relative %: 81 %
Platelets: 276 10*3/uL (ref 150–400)
RBC: 5.46 MIL/uL — ABNORMAL HIGH (ref 3.87–5.11)
RDW: 13.3 % (ref 11.5–15.5)
WBC: 10 10*3/uL (ref 4.0–10.5)
nRBC: 0.5 % — ABNORMAL HIGH (ref 0.0–0.2)

## 2021-04-16 LAB — TROPONIN I (HIGH SENSITIVITY)
Troponin I (High Sensitivity): 2296 ng/L (ref <18)
Troponin I (High Sensitivity): 1704 ng/L (ref <18)

## 2021-04-16 LAB — PHOSPHORUS: Phosphorus: 10.8 mg/dL — ABNORMAL HIGH (ref 2.5–4.6)

## 2021-04-16 LAB — I-STAT CHEM 8, ED
BUN: >130 mg/dL — ABNORMAL HIGH (ref 6–20)
Calcium, Ion: 0.71 mmol/L — CL (ref 1.15–1.40)
Chloride: 94 mmol/L — ABNORMAL LOW (ref 98–111)
Creatinine, Ser: 16.4 mg/dL — ABNORMAL HIGH (ref 0.44–1.00)
Glucose, Bld: 95 mg/dL (ref 70–99)
HCT: 49 % — ABNORMAL HIGH (ref 36.0–46.0)
Hemoglobin: 16.7 g/dL — ABNORMAL HIGH (ref 12.0–15.0)
Potassium: 4.5 mmol/L (ref 3.5–5.1)
Sodium: 128 mmol/L — ABNORMAL LOW (ref 135–145)
TCO2: 22 mmol/L (ref 22–32)

## 2021-04-16 LAB — TSH: TSH: 0.724 u[IU]/mL (ref 0.350–4.500)

## 2021-04-16 LAB — ECHOCARDIOGRAM COMPLETE
Area-P 1/2: 3.19 cm2
Calc EF: 21.7 %
MV M vel: 4.06 m/s
MV Peak grad: 65.9 mmHg
Radius: 0.3 cm
S' Lateral: 4.4 cm
Single Plane A2C EF: 23.1 %
Single Plane A4C EF: 19.4 %

## 2021-04-16 LAB — MAGNESIUM: Magnesium: 1.9 mg/dL (ref 1.7–2.4)

## 2021-04-16 LAB — CORTISOL: Cortisol, Plasma: 18.2 ug/dL

## 2021-04-16 LAB — HIV ANTIBODY (ROUTINE TESTING W REFLEX): HIV Screen 4th Generation wRfx: NONREACTIVE

## 2021-04-16 LAB — ETHANOL: Alcohol, Ethyl (B): <10 mg/dL (ref <10)

## 2021-04-16 LAB — CK: Total CK: 1209 U/L — ABNORMAL HIGH (ref 38–234)

## 2021-04-16 LAB — LACTIC ACID, PLASMA
Lactic Acid, Venous: 2.2 mmol/L (ref 0.5–1.9)
Lactic Acid, Venous: 2.1 mmol/L (ref 0.5–1.9)

## 2021-04-16 LAB — RESP PANEL BY RT-PCR (FLU A&B, COVID) ARPGX2
Influenza A by PCR: NEGATIVE
Influenza B by PCR: NEGATIVE
SARS Coronavirus 2 by RT PCR: NEGATIVE

## 2021-04-16 LAB — BETA-HYDROXYBUTYRIC ACID: Beta-Hydroxybutyric Acid: 0.1 mmol/L (ref 0.05–0.27)

## 2021-04-16 LAB — PROCALCITONIN: Procalcitonin: 1.32 ng/mL

## 2021-04-16 MED ORDER — SODIUM CHLORIDE 0.9 % IV SOLN
1.0000 g | INTRAVENOUS | Status: DC
  Filled 2021-04-16: qty 10

## 2021-04-16 MED ORDER — HEPARIN SODIUM (PORCINE) 5000 UNIT/ML IJ SOLN
5000.0000 [IU] | Freq: Three times a day (TID) | INTRAMUSCULAR | Status: DC
  Administered 2021-04-16 – 2021-04-18 (×4): 5000 [IU] via SUBCUTANEOUS
  Filled 2021-04-16 (×7): qty 1

## 2021-04-16 MED ORDER — DEXTROSE 50 % IV SOLN
INTRAVENOUS | Status: AC
  Administered 2021-04-16: 50 mL
  Filled 2021-04-16: qty 50

## 2021-04-16 MED ORDER — DELFLEX-LC/2.5% DEXTROSE 394 MOSM/L IP SOLN: INTRAPERITONEAL | Status: DC

## 2021-04-16 MED ORDER — GABAPENTIN 100 MG PO CAPS
100.0000 mg | ORAL_CAPSULE | Freq: Every day | ORAL | Status: DC
  Administered 2021-04-16 – 2021-04-22 (×6): 100 mg via ORAL
  Filled 2021-04-16 (×6): qty 1

## 2021-04-16 MED ORDER — ACETAMINOPHEN 325 MG PO TABS
650.0000 mg | ORAL_TABLET | ORAL | Status: DC | PRN
  Administered 2021-04-17 – 2021-04-20 (×5): 650 mg via ORAL
  Filled 2021-04-16 (×5): qty 2

## 2021-04-16 MED ORDER — DELFLEX-LC/1.5% DEXTROSE 344 MOSM/L IP SOLN: INTRAPERITONEAL | Status: DC

## 2021-04-16 MED ORDER — CALCIUM GLUCONATE-NACL 1-0.675 GM/50ML-% IV SOLN
1.0000 g | Freq: Once | INTRAVENOUS | Status: AC
  Administered 2021-04-16: 1000 mg via INTRAVENOUS
  Filled 2021-04-16: qty 50

## 2021-04-16 MED ORDER — VANCOMYCIN HCL IN DEXTROSE 1-5 GM/200ML-% IV SOLN
1000.0000 mg | Freq: Once | INTRAVENOUS | Status: AC
  Administered 2021-04-16: 1000 mg via INTRAVENOUS
  Filled 2021-04-16: qty 200

## 2021-04-16 MED ORDER — CINACALCET HCL 30 MG PO TABS: 60.0000 mg | ORAL_TABLET | Freq: Every day | ORAL | Status: DC

## 2021-04-16 MED ORDER — ASPIRIN EC 81 MG PO TBEC
81.0000 mg | DELAYED_RELEASE_TABLET | Freq: Every day | ORAL | Status: DC
  Administered 2021-04-17 – 2021-04-18 (×2): 81 mg via ORAL
  Filled 2021-04-16 (×2): qty 1

## 2021-04-16 MED ORDER — METRONIDAZOLE 500 MG/100ML IV SOLN
500.0000 mg | Freq: Once | INTRAVENOUS | Status: AC
  Administered 2021-04-16: 500 mg via INTRAVENOUS
  Filled 2021-04-16: qty 100

## 2021-04-16 MED ORDER — CALCIUM ACETATE (PHOS BINDER) 667 MG PO CAPS: 667.0000 mg | ORAL_CAPSULE | Freq: Two times a day (BID) | ORAL | Status: DC | PRN

## 2021-04-16 MED ORDER — PERFLUTREN LIPID MICROSPHERE
1.0000 mL | INTRAVENOUS | Status: AC | PRN
  Administered 2021-04-16: 1.5 mL via INTRAVENOUS
  Filled 2021-04-16: qty 10

## 2021-04-16 MED ORDER — NITROGLYCERIN 0.4 MG SL SUBL: 0.4000 mg | SUBLINGUAL_TABLET | SUBLINGUAL | Status: DC | PRN

## 2021-04-16 MED ORDER — INSULIN ASPART 100 UNIT/ML IJ SOLN
0.0000 [IU] | Freq: Three times a day (TID) | INTRAMUSCULAR | Status: DC
  Administered 2021-04-17: 1 [IU] via SUBCUTANEOUS
  Administered 2021-04-17 (×2): 2 [IU] via SUBCUTANEOUS
  Administered 2021-04-18: 3 [IU] via SUBCUTANEOUS
  Administered 2021-04-18: 4 [IU] via SUBCUTANEOUS
  Administered 2021-04-19: 2 [IU] via SUBCUTANEOUS
  Administered 2021-04-19: 4 [IU] via SUBCUTANEOUS
  Administered 2021-04-19: 6 [IU] via SUBCUTANEOUS
  Administered 2021-04-20: 5 [IU] via SUBCUTANEOUS

## 2021-04-16 MED ORDER — CALCIUM ACETATE (PHOS BINDER) 667 MG PO CAPS: 667.0000 mg | ORAL_CAPSULE | ORAL | Status: DC

## 2021-04-16 MED ORDER — HYDRALAZINE HCL 25 MG PO TABS: 25.0000 mg | ORAL_TABLET | Freq: Four times a day (QID) | ORAL | Status: DC | PRN

## 2021-04-16 MED ORDER — ASPIRIN 81 MG PO CHEW: 324.0000 mg | CHEWABLE_TABLET | ORAL | Status: DC

## 2021-04-16 MED ORDER — ASPIRIN 81 MG PO CHEW
324.0000 mg | CHEWABLE_TABLET | Freq: Once | ORAL | Status: AC
Start: 2021-04-16 — End: 2021-04-16
  Administered 2021-04-16: 324 mg via ORAL
  Filled 2021-04-16: qty 4

## 2021-04-16 MED ORDER — SODIUM CHLORIDE 0.9 % IV BOLUS
500.0000 mL | Freq: Once | INTRAVENOUS | Status: AC
  Administered 2021-04-16: 500 mL via INTRAVENOUS

## 2021-04-16 MED ORDER — GENTAMICIN SULFATE 0.1 % EX CREA
1.0000 "application " | TOPICAL_CREAM | Freq: Every day | CUTANEOUS | Status: DC
  Filled 2021-04-16: qty 15

## 2021-04-16 MED ORDER — CALCITRIOL 0.25 MCG PO CAPS
0.5000 ug | ORAL_CAPSULE | Freq: Every day | ORAL | Status: DC
  Administered 2021-04-16 – 2021-04-22 (×7): 0.5 ug via ORAL
  Filled 2021-04-16: qty 1
  Filled 2021-04-16 (×5): qty 2
  Filled 2021-04-16: qty 1

## 2021-04-16 MED ORDER — ASPIRIN 300 MG RE SUPP: 300.0000 mg | RECTAL | Status: DC

## 2021-04-16 MED ORDER — SODIUM CHLORIDE 0.9 % IV SOLN
2.0000 g | Freq: Once | INTRAVENOUS | Status: AC
  Administered 2021-04-16: 2 g via INTRAVENOUS
  Filled 2021-04-16: qty 12.5

## 2021-04-16 MED ORDER — ONDANSETRON HCL 4 MG/2ML IJ SOLN
4.0000 mg | Freq: Four times a day (QID) | INTRAMUSCULAR | Status: DC | PRN
  Administered 2021-04-17 (×2): 4 mg via INTRAVENOUS
  Filled 2021-04-16 (×2): qty 2

## 2021-04-16 MED ORDER — CALCIUM ACETATE (PHOS BINDER) 667 MG PO CAPS
2001.0000 mg | ORAL_CAPSULE | Freq: Three times a day (TID) | ORAL | Status: DC
  Administered 2021-04-16 – 2021-04-22 (×13): 2001 mg via ORAL
  Filled 2021-04-16 (×13): qty 3

## 2021-04-16 NOTE — ED Notes (Signed)
Per Regenia Skeeter MD, pt does not need Bair Hugger for warming, instead pt given several warm blankets. Warm fluids given for 551mL NS bolus.  ?

## 2021-04-16 NOTE — ED Notes (Signed)
Nephrology aware of patient being unable to get PD tonight.  ?

## 2021-04-16 NOTE — Telephone Encounter (Signed)
Labs were faxed back in March. Will refax labs  ?

## 2021-04-16 NOTE — ED Notes (Signed)
Contacted operator to talk to on call peritoneal dialysis nurse. Gave name and phone number to call back to this RN. Awaiting call back.  ?

## 2021-04-16 NOTE — ED Provider Notes (Signed)
?[facility] ?Provider Note ? ? ?CSN: [id number] ?Arrival date & time: 04/25/2021  1126 ? ?  ? ?History ? ?Chief Complaint  ?Patient presents with  ? Weakness  ? ? ?Ann Bryan is a 61 y.o. female. ? 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  She has been feeling weak for almost 1 week in total but it has progressively gotten worse.  There is been no fevers.  Recently she was too weak to make to an appointment and she suspect she might have some anemia.  She felt bad yesterday and presumed it was from hypoglycemia from accidentally taking too much insulin and not eating and she threw up and had diffuse myalgias.  She later self down to the floor but did not injure herself.  However she was too weak to get up.  She was still connected to her peritoneal dialysis.  She feels cold and hungry now and no longer has nausea, vomiting, or pain.  Chronic rash to her extremities.  She feels so weak she cannot sit up and had to be helped up.  She does not urinate.  She felt transiently short of breath yesterday but none now. ? ?She has a chronic rash to her lower extremities that is unchanged. ? ?She is noted to have mild weakness in the left side compared to the right and she states has been ongoing for about a month or so and she has to lean on her right side to go up stairs.  No back pain. ? ?Her dialysate has appeared clear ? ?Home Medications ?Prior to Admission medications   ?Medication Sig Start Date End Date Taking? Authorizing Provider  ?amLODipine (NORVASC) 10 MG tablet Take 1 tablet (10 mg total) by mouth daily. Must have office visit for refills 01/03/20   Charlott Rakes, MD  ?atorvastatin (LIPITOR) 20 MG tablet Take 1 tablet by mouth daily. 11/30/19   Ladell Pier, MD  ?Blood Glucose Monitoring Suppl  (TRUE METRIX METER) w/Device KIT 1 each by Does not apply route as needed. 11/18/18   Ladell Pier, MD  ?calcium acetate (PHOSLO) 667 MG capsule Take 667-2,001 mg by mouth See admin instructions. 2,001 mg with meals 3 times daily and 667 mg 2 times daily with snacks    [provider]  ?carvedilol (COREG) 25 MG tablet Take 1 and 1/2 tablets by mouth twice daily with a meal. Must have office visit for refills 12/26/19   Ladell Pier, MD  ?cinacalcet (SENSIPAR) 60 MG tablet Take 60 mg by mouth daily. 03/22/19   [provider]  ?cloNIDine (CATAPRES) 0.2 MG tablet Take 0.2 mg by mouth 2 (two) times daily. 06/27/20   [provider]  ?Continuous Blood Gluc Receiver (DEXCOM G6 RECEIVER) DEVI Use as instructed to check blood sugar daily 03/29/20   Shamleffer, Melanie Crazier, MD  ?Continuous Blood Gluc Sensor (DEXCOM G6 SENSOR) MISC 1 Device by Does not apply route as directed. 02/29/20   Shamleffer, Melanie Crazier, MD  ?Continuous Blood Gluc Transmit (DEXCOM G6 TRANSMITTER) MISC 1 Device by Does not apply route as directed. 02/29/20   Shamleffer, Melanie Crazier, MD  ?gabapentin (NEURONTIN) 100 MG capsule 1 cap PO every other day 02/26/21   Ladell Pier, MD  ?glucose blood [facility] VERIO) test strip Use as instructed to check blood sugar 3 times daily Dx is E11.65 03/23/20  Shamleffer, Melanie Crazier, MD  ?insulin aspart (NOVOLOG FLEXPEN) 100 UNIT/ML FlexPen 7 units subcut with meals 02/26/21   Ladell Pier, MD  ?insulin glargine (LANTUS SOLOSTAR) 100 UNIT/ML Solostar Pen Inject 30 Units into the skin daily. 02/26/21   Ladell Pier, MD  ?Insulin Pen Needle 32G X 4 MM MISC 1 Device by Does not apply route in the morning, at noon, in the evening, and at bedtime. 08/31/20   Shamleffer, Melanie Crazier, MD  ?Insulin Syringe-Needle U-100 (INSULIN SYRINGE 1CC/31GX5/16") 31G X 5/16" 1 ML MISC Use as directed.  BD 27m x 31G needles 01/22/17   JLadell Pier MD  ?Insulin  Syringes, Disposable, U-100 0.5 ML MISC 1 each by Does not apply route 2 (two) times daily. 01/30/14   Funches, JAdriana Mccallum MD  ?TRUEplus Lancets 28G MISC 1 each by Does not apply route 3 (three) times daily. 12/20/20   Shamleffer, IMelanie Crazier MD  ?   ? ?Allergies    ?Biopatch protective disk-chg [chlorhexidine], Doxycycline, and Victoza [liraglutide]   ? ?Review of Systems   ?Review of Systems  ?Respiratory:  Positive for shortness of breath. Negative for cough.   ?Cardiovascular:  Negative for chest pain.  ?Gastrointestinal:  Positive for vomiting.  ?Musculoskeletal:  Positive for myalgias. Negative for back pain.  ?Neurological:  Positive for weakness. Negative for headaches.  ? ?Physical Exam ?Updated Vital Signs ?BP 113/84   Pulse 68   Temp (!) 95.4 ?F (35.2 ?C) (Rectal)   Resp 18   LMP 03/13/2002   SpO2 100%  ?Physical Exam ?Vitals and nursing note reviewed.  ?Constitutional:   ?   Appearance: She is well-developed.  ?HENT:  ?   Head: Normocephalic and atraumatic.  ?   Mouth/Throat:  ?   Mouth: Mucous membranes are dry.  ?Eyes:  ?   Pupils: Pupils are equal, round, and reactive to light.  ?Cardiovascular:  ?   Rate and Rhythm: Normal rate and regular rhythm.  ?   Heart sounds: Normal heart sounds.  ?Pulmonary:  ?   Effort: Pulmonary effort is normal.  ?   Breath sounds: Normal breath sounds. No wheezing.  ?Abdominal:  ?   General: There is no distension.  ?   Palpations: Abdomen is soft.  ?   Tenderness: There is no abdominal tenderness.  ?Skin: ?   General: Skin is warm and dry.  ?   Findings: Rash (chronic appearing nodular rash to lower extremities) present.  ?Neurological:  ?   Mental Status: She is alert and oriented to person, place, and time.  ?   Comments: CN 3-12 grossly intact. 5/5 strength in all 4 extremities though there is some mild weakness in the left lower extremity compared to the right. Grossly normal sensation.   ? ? ?ED Results / Procedures / Treatments   ?Labs ?(all labs ordered are  listed, but only abnormal results are displayed) ?Labs Reviewed  ?COMPREHENSIVE METABOLIC PANEL - Abnormal; Notable for the following components:  ?    Result Value  ? Sodium 132 (*)   ? Chloride 93 (*)   ? CO2 13 (*)   ? BUN 114 (*)   ? Creatinine, Ser 16.03 (*)   ? Calcium 6.5 (*)   ? Total Protein 5.3 (*)   ? Albumin 2.0 (*)   ? Alkaline Phosphatase 171 (*)   ? GFR, Estimated 2 (*)   ? Anion gap 26 (*)   ? All other components within normal limits  ?CK -  Abnormal; Notable for the following components:  ? Total CK 1,209 (*)   ? All other components within normal limits  ?LACTIC ACID, PLASMA - Abnormal; Notable for the following components:  ? Lactic Acid, Venous 2.2 (*)   ? All other components within normal limits  ?CBC WITH DIFFERENTIAL/PLATELET - Abnormal; Notable for the following components:  ? RBC 5.46 (*)   ? Hemoglobin 15.8 (*)   ? HCT 46.5 (*)   ? nRBC 0.5 (*)   ? Neutro Abs 8.2 (*)   ? Abs Immature Granulocytes 0.14 (*)   ? All other components within normal limits  ?PHOSPHORUS - Abnormal; Notable for the following components:  ? Phosphorus 10.8 (*)   ? All other components within normal limits  ?CBG MONITORING, ED - Abnormal; Notable for the following components:  ? Glucose-Capillary 119 (*)   ? All other components within normal limits  ?I-STAT CHEM 8, ED - Abnormal; Notable for the following components:  ? Sodium 128 (*)   ? Chloride 94 (*)   ? BUN >130 (*)   ? Creatinine, Ser 16.40 (*)   ? Calcium, Ion 0.71 (*)   ? Hemoglobin 16.7 (*)   ? HCT 49.0 (*)   ? All other components within normal limits  ?TROPONIN I (HIGH SENSITIVITY) - Abnormal; Notable for the following components:  ? Troponin I (High Sensitivity) 2,296 (*)   ? All other components within normal limits  ?RESP PANEL BY RT-PCR (FLU A&B, COVID) ARPGX2  ?CULTURE, BLOOD (ROUTINE X 2)  ?CULTURE, BLOOD (ROUTINE X 2)  ?BODY FLUID CULTURE W GRAM STAIN  ?ETHANOL  ?TSH  ?MAGNESIUM  ?LACTIC ACID, PLASMA  ?BODY FLUID CELL COUNT WITH DIFFERENTIAL   ?BLOOD GAS, VENOUS  ?HIV ANTIBODY (ROUTINE TESTING W REFLEX)  ?CORTISOL  ?BETA-HYDROXYBUTYRIC ACID  ?HEMOGLOBIN A1C  ?PROCALCITONIN  ?TROPONIN I (HIGH SENSITIVITY)  ? ? ?EKG ?EKG Interpretation ? ?Date/Time:  Gerrit Heck

## 2021-04-16 NOTE — Progress Notes (Signed)
Pharmacy Antibiotic Note ? ?Ann Bryan is a 61 y.o. female admitted on 05/05/2021 with sepsis.  Pharmacy has been consulted for vancomycin and cefepime dosing. ? ?Patient presented with serum creatinine 16.4 and is on peritoneal dialysis. WBC 10 and no fever on presentation.  ? ?Plan: ?Cefepime 1 g IV q24h  ?Vancomycin per levels  ?F/u clinical course, renal function, cultures, fever curve ?  ? ?Temp (24hrs), Avg:95.4 ?F (35.2 ?C), Min:94.9 ?F (34.9 ?C), Max:95.8 ?F (35.4 ?C) ? ?Recent Labs  ?Lab 04/12/2021 ?1202 04/29/2021 ?1209 04/12/2021 ?1234  ?WBC  --  10.0  --   ?CREATININE 16.03*  --  16.40*  ?LATICACIDVEN 2.2*  --   --   ?  ?CrCl cannot be calculated (Unknown ideal weight.).   ? ?Allergies  ?Allergen Reactions  ? Biopatch Protective Disk-Chg [Chlorhexidine]   ?  States rash with use of CHG bath  ? Doxycycline Nausea And Vomiting  ? Victoza [Liraglutide] Nausea And Vomiting  ? ? ?Antimicrobials this admission: ?Cefepime 4/11 >>  ?Vancomycin 4/11 >>  ?Metronidazole 4/11 >> ? ?Dose adjustments this admission: ?N/A ? ?Microbiology results: ?4/11 BCx: in process ? ? ?Thank you for allowing pharmacy to be a part of this patient?s care. ? ?Zenaida Deed, PharmD ?PGY1 Acute Care Pharmacy Resident  ?Phone: [phone] ?04/13/2021  2:02 PM ? ?Please check [email or web address] for unit-specific pharmacy phone numbers. ? ? ?

## 2021-04-16 NOTE — ED Notes (Signed)
Echo and admitting at bedside ? ?

## 2021-04-16 NOTE — ED Notes (Addendum)
Spoke with Peritoneal Dialysis nurse that said pt was unable to get PD while in ED. Pt could get PD when she got to a room or she would have to miss tonight's session if she remained in the ED. MD notified.  ?

## 2021-04-16 NOTE — Consult Note (Signed)
[facility] ?Renal Consultation Note  ?  ?Indication for Consultation:  Management of ESRD/hemodialysis, anemia, hypertension/volume, and secondary hyperparathyroidism. ? ?HPI: Ann Bryan is a 61 y.o. female who presented to the ED with weakness. Her outpatient HD nurse visited her house today because she was missing appointments and they could not get in touch with her. When she did not answer the door, she called the police for a wellness check and patient was found down on the floor, still hooked up to her PD machine. Pt reports she was getting ready to go to the home dialysis appointment Monday morning but thinks she gave herself too much insulin and became very weak. She was able to reach her glucose tabs and took a couple. Reports she could not get up and remained on the floor until today. Did not do PD last night. Denies any recent fevers/chills. Says she has had a poor appetite for the past several weeks and is not eating or drinking much. Labs notable for K+ 4.7, BUN 114, Cr 16.03, Ca 6.5, Alb 2.0, WBC 10.0, Hgb 15.8, Plt 276, troponin and lactic acid elevated. Temp 95.22F, VS otherwise stable.  ? ?Patient's outpatient dialysis RN, Melisa, expresses concern that patient cannot properly care for herself at home lately and she has been declining over the past month. She reports it is typically for her to call and reschedule appointments, but lately she is not attending appointments, calling, or answering her phone. They are not sure how compliant she has been with PD lately. Patient herself admits to missing dialysis last night and says she thinks she does her dialysis every night but does it at irregular times and has trouble remembering how often she misses. She reports compliance with her medications but has had trouble managing her blood sugar lately. She reports she continues to feel weak and cannot walk but is very hungry right now. Also reports feeling cold. Denies SOB, CP,  palpitations, dizziness, abdominal pain, N/V/D, constipation, cloudy/bloody PD fluid. She is very firmly against hemodialysis.  ? ?Past Medical History:  ?Diagnosis Date  ? Anemia   ? Cataract   ? right eye  ? Complication of anesthesia   ?  " I woke up itching from anesthesia many years ago."   ? Depression   ? at times  ? Diabetes mellitus 1986  ?  poorly controlled last hemoglobin A1c  10.5 on January 01, 2010, increase since June 2011 when hemoglobin A1c was 9.3 , determined to be most likely secondary to non-compliance.  ? Diabetic nephropathy ([place])   ? Diabetic retinopathy   ? Dyspnea   ? "when I have too much fluid"  ? End stage renal disease on dialysis [facility])   ? T/Th/Sat [address]  ? Glaucoma   ? History of blood transfusion   ? Hyperlipidemia   ? Hypertension   ? Nausea and vomiting   ? Neuropathy   ? Retinopathy   ? Wears glasses   ? ?Past Surgical History:  ?Procedure Laterality Date  ? ABDOMINAL HYSTERECTOMY   2004  ? AV FISTULA PLACEMENT Left 01/21/2019  ? Procedure: BRACHIOCEPHALIC ARTERIOVENOUS (AV) FISTULA CREATION LEFT ARM;  Surgeon: Serafina Mitchell, MD;  Location: [facility];  Service: Vascular;  Laterality: Left;  ? EYE SURGERY    ? FISTULA SUPERFICIALIZATION Left 05/20/2019  ? Procedure: FISTULA SUPERFICIALIZATION LEFT PROXIMAL FISTULA;  Surgeon: Serafina Mitchell, MD;  Location: [facility] OR;  Service: Vascular;  Laterality: Left;  ? IR FLUORO  GUIDE CV LINE RIGHT  11/15/2018  ? IR US GUIDE VASC ACCESS RIGHT  11/15/2018  ? MYOMECTOMY  1994  ? REFRACTIVE SURGERY Bilateral   ? ?Family History  ?Problem Relation Age of Onset  ? Diabetes Mother   ? Stroke Mother   ? Hypertension Mother   ? Alcohol abuse Mother   ? Depression Father   ?     Committed suicide  ? Alcohol abuse Maternal Uncle   ? ?Social History: ? reports that she has never smoked. She has never used smokeless tobacco. She reports that she does not drink alcohol and does not use drugs. ? ?ROS: As per HPI otherwise negative. ? ?Physical  Exam: ?Vitals:  ? 04/20/2021 1345 04/25/2021 1400 04/30/2021 1415 04/13/2021 1430  ?BP: 115/70 111/82 110/72 113/84  ?Pulse: 65 65 62 68  ?Resp: _0 ?Temp:      ?TempSrc:      ?SpO2: 100% 100% 100% 100%  ?   ?General: Well developed, alert, in no acute distress. ?Head: Normocephalic, atraumatic, sclera non-icteric, mucus membranes are moist. ?Neck: VD not elevated. ?Lungs: Clear bilaterally to auscultation without wheezes, rales, or rhonchi. Breathing is unlabored. ?Heart: RRR with normal S1, S2. No murmurs, rubs, or gallops appreciated. ?Abdomen: Soft, moderately distended, +BS. No TTP, no obvious masses ?Lower extremities: Trace edema LLE, no edema RLE ?Neuro: Alert and oriented X 3. Moves all extremities spontaneously. ?Psych:  Responds to questions appropriately with a normal affect. ?Dialysis Access: PD cath in abdomen without surrounding erythema, warmth or drainage. Old AVF without bruit ? ?Allergies  ?Allergen Reactions  ? Biopatch Protective Disk-Chg [Chlorhexidine]   ?  States rash with use of CHG bath  ? Doxycycline Nausea And Vomiting  ? Victoza [Liraglutide] Nausea And Vomiting  ? ?Prior to Admission medications   ?Medication Sig Start Date End Date Taking? Authorizing Provider  ?amLODipine (NORVASC) 10 MG tablet Take 1 tablet (10 mg total) by mouth daily. Must have office visit for refills 01/03/20   Charlott Rakes, MD  ?atorvastatin (LIPITOR) 20 MG tablet Take 1 tablet by mouth daily. 11/30/19   Ladell Pier, MD  ?Blood Glucose Monitoring Suppl (TRUE METRIX METER) w/Device KIT 1 each by Does not apply route as needed. ?Patient not taking: Reported on 02/29/2020 11/18/18   Ladell Pier, MD  ?calcium acetate (PHOSLO) 667 MG capsule Take 667-2,001 mg by mouth See admin instructions. 2,001 mg with meals 3 times daily and 667 mg 2 times daily with snacks    [provider]  ?carvedilol (COREG) 25 MG tablet Take 1 and 1/2 tablets by mouth twice daily with a meal. Must have office visit  for refills 12/26/19   Ladell Pier, MD  ?cinacalcet (SENSIPAR) 60 MG tablet Take 60 mg by mouth daily. 03/22/19   [provider]  ?cloNIDine (CATAPRES) 0.2 MG tablet Take 0.2 mg by mouth 2 (two) times daily. 06/27/20   [provider]  ?Continuous Blood Gluc Receiver (DEXCOM G6 RECEIVER) DEVI Use as instructed to check blood sugar daily 03/29/20   Shamleffer, Melanie Crazier, MD  ?Continuous Blood Gluc Sensor (DEXCOM G6 SENSOR) MISC 1 Device by Does not apply route as directed. 02/29/20   Shamleffer, Melanie Crazier, MD  ?Continuous Blood Gluc Transmit (DEXCOM G6 TRANSMITTER) MISC 1 Device by Does not apply route as directed. 02/29/20   Shamleffer, Melanie Crazier, MD  ?gabapentin (NEURONTIN) 100 MG capsule 1 cap PO every other day 02/26/21   Ladell Pier, MD  ?  glucose blood (ONETOUCH VERIO) test strip Use as instructed to check blood sugar 3 times daily Dx is E11.65 03/23/20   Shamleffer, Melanie Crazier, MD  ?insulin aspart (NOVOLOG FLEXPEN) 100 UNIT/ML FlexPen 7 units subcut with meals 02/26/21   Ladell Pier, MD  ?insulin glargine (LANTUS SOLOSTAR) 100 UNIT/ML Solostar Pen Inject 30 Units into the skin daily. 02/26/21   Ladell Pier, MD  ?Insulin Pen Needle 32G X 4 MM MISC 1 Device by Does not apply route in the morning, at noon, in the evening, and at bedtime. 08/31/20   Shamleffer, Melanie Crazier, MD  ?Insulin Syringe-Needle U-100 (INSULIN SYRINGE 1CC/31GX5/16") 31G X 5/16" 1 ML MISC Use as directed.  BD 25m x 31G needles 01/22/17   JLadell Pier MD  ?Insulin Syringes, Disposable, U-100 0.5 ML MISC 1 each by Does not apply route 2 (two) times daily. 01/30/14   Funches, JAdriana Mccallum MD  ?TRUEplus Lancets 28G MISC 1 each by Does not apply route 3 (three) times daily. 12/20/20   Shamleffer, IMelanie Crazier MD  ? ?Current Facility-Administered Medications  ?Medication Dose Route Frequency Provider Last Rate Last Admin  ? acetaminophen (TYLENOL) tablet 650 mg  650 mg Oral Q4H PRN  ZWynetta FinesT, MD      ? aspirin chewable tablet 324 mg  324 mg Oral NOW ZWynetta FinesT, MD      ? Or  ? aspirin suppository 300 mg  300 mg Rectal NOW ZLequita Halt MD      ? [Derrill MemoON 04/17/2021] aspirin EC tablet 81 mg

## 2021-04-16 NOTE — Progress Notes (Signed)
?  Echocardiogram ?2D Echocardiogram has been performed. ? ?Fidel Levy ?04/22/2021, 4:25 PM ?

## 2021-04-16 NOTE — Sepsis Progress Note (Signed)
Code sepsis protocol being monitored by eLink. 

## 2021-04-16 NOTE — ED Triage Notes (Signed)
Pt BIB [facility] from home, EMS was called by [employer]. Pt does peritoneal dialysis at home every day. EMS reports that pt as seen two weeks ago for possible TIA, pt states she was told sx were not a TIA. Pt endorses increased generalized weakness and malaise over the past few days. Pt was supposed to get labs drawn yesterday but was too weak to go. Pt was on the floor since yesterday. Missed 1 peritoneal dialysis. Pt denies any pain, SOB, CP, N/V/D. Pts only complaints are feeling hungry and weak.  ? ?EMS reports pt is afebrile, HR 90s, BP 122/76, CBG 123, RR 16. SpO2 98% RA.  ?

## 2021-04-16 NOTE — Telephone Encounter (Signed)
Copied from [place] [id number]. Topic: Medical Record Request - Other ?>> Apr 16, 2021  9:09 AM Tessa Lerner A wrote: ?Patient Name/DOB/MRN #: Ann Bryan  ?Requestor Name/Agency: [facility]  ?Call Back #: [phone] ?Fax #: [phone] ? ?[name] has called for an update on previously requested GCA and CRP results  ? ?Please contact further when possible or submit by Fax  ? ?Route to [employer] for [employer]. For all other clinics, route to the clinic's PEC Pool. ?

## 2021-04-16 NOTE — Consult Note (Addendum)
?Cardiology Consultation:  ? ?Patient ID: Ann Bryan ?MRN: [id number]; DOB: 1960/10/22 ? ?Admit date: 04/12/2021 ?Date of Consult: 05/04/2021 ? ?PCP:  Ladell Pier, MD ?  ?[facility] Providers ?Cardiologist:  None   (new)  ? ? ?Patient Profile:  ? ?Ann Bryan is a 61 y.o. female with a hx of HTN, diabetes, diabetic nephropathy that lead to ESRD on PD, HLD, anxiety/depression who is being seen 04/29/2021 for the evaluation of NSTEMI at the request of Dr. Roosevelt Locks. ? ?History of Present Illness:  ? ?Ann Bryan is a 61 year old female with above medical history. Per chart review, patient does not have any known cardiac history and she has not been seen by a cardiologist. Patient was started on peritoneal dialysis in 08/2019, evaluated by [facility] for possible kidney transplant but feels she does not have the support to go through transplant.  ? ?Patient presented to the emergency department via EMS on 4/11.  EMS was called by the police department.  Patient does peritoneal dialysis at home every day, EMS reported that patient was seen 2 weeks ago for possible TIA. However, workup was negative for TIA.  Patient has had increased generalized weakness and malaise over past few days, and has been on the floor since yesterday. She missed 1 peritoneal dialysis. ?In the ED, patient was noted to be hypothermic with a temperature of 94.9 F. Patient oxygenating well on room air. BP soft, but within normal limits. Labs in the ED showed Na 132, K 4.7, creatinine 16.03, calcium 6.5, albumin 2.0, total protein 5.3, hemoglobin 15.8, WBC 10.0, platelets 276. Initial hsTn 2296. Lactic acid 2.2.  ?CXR showed low lung volumes with cardiomegaly and vascular congestion, basilar atelectasis.EKG showed sinus rhyhtm, (HR 61 BPM), a PAC, prolonged QT interval (QT/QTcB 547/552)  ?  ?On interview, patient reports that yesterday she was getting set up for PD and she took her insulin.  Began to feel weak and fell to the floor.  Denies any  dizziness, loss of consciousness.  Denies any chest pain, shortness of breath, palpitations.  Patient reports that she did have an episode of chest pain about 3 weeks ago, reports it was the worst chest pain she is ever had but she did not go to the hospital.  Patient denies any recent shortness of breath on exertion, orthopnea.  Does complain of increased weakness.  Has had some swelling in her ankles.  Denies any cardiac history.  Reports missing 1 dose of PD.  Patient does not produce urine. ? ?Past Medical History:  ?Diagnosis Date  ? Anemia   ? Cataract   ? right eye  ? Complication of anesthesia   ?  " I woke up itching from anesthesia many years ago."   ? Depression   ? at times  ? Diabetes mellitus 1986  ?  poorly controlled last hemoglobin A1c  10.5 on January 01, 2010, increase since June 2011 when hemoglobin A1c was 9.3 , determined to be most likely secondary to non-compliance.  ? Diabetic nephropathy ([place])   ? Diabetic retinopathy   ? Dyspnea   ? "when I have too much fluid"  ? End stage renal disease on dialysis [facility])   ? T/Th/Sat [address]  ? Glaucoma   ? History of blood transfusion   ? Hyperlipidemia   ? Hypertension   ? Nausea and vomiting   ? Neuropathy   ? Retinopathy   ? Wears glasses   ? ? ?Past Surgical History:  ?  Procedure Laterality Date  ? ABDOMINAL HYSTERECTOMY   2004  ? AV FISTULA PLACEMENT Left 01/21/2019  ? Procedure: BRACHIOCEPHALIC ARTERIOVENOUS (AV) FISTULA CREATION LEFT ARM;  Surgeon: Serafina Mitchell, MD;  Location: [facility] OR;  Service: Vascular;  Laterality: Left;  ? EYE SURGERY    ? FISTULA SUPERFICIALIZATION Left 05/20/2019  ? Procedure: FISTULA SUPERFICIALIZATION LEFT PROXIMAL FISTULA;  Surgeon: Serafina Mitchell, MD;  Location: [facility];  Service: Vascular;  Laterality: Left;  ? IR FLUORO GUIDE CV LINE RIGHT  11/15/2018  ? IR US GUIDE VASC ACCESS RIGHT  11/15/2018  ? MYOMECTOMY  1994  ? REFRACTIVE SURGERY Bilateral   ?  ? ?Home Medications:  ?Prior to Admission medications    ?Medication Sig Start Date End Date Taking? Authorizing Provider  ?amLODipine (NORVASC) 10 MG tablet Take 1 tablet (10 mg total) by mouth daily. Must have office visit for refills 01/03/20   Charlott Rakes, MD  ?atorvastatin (LIPITOR) 20 MG tablet Take 1 tablet by mouth daily. 11/30/19   Ladell Pier, MD  ?Blood Glucose Monitoring Suppl (TRUE METRIX METER) w/Device KIT 1 each by Does not apply route as needed. ?Patient not taking: Reported on 02/29/2020 11/18/18   Ladell Pier, MD  ?calcium acetate (PHOSLO) 667 MG capsule Take 667-2,001 mg by mouth See admin instructions. 2,001 mg with meals 3 times daily and 667 mg 2 times daily with snacks    [provider]  ?carvedilol (COREG) 25 MG tablet Take 1 and 1/2 tablets by mouth twice daily with a meal. Must have office visit for refills 12/26/19   Ladell Pier, MD  ?cinacalcet (SENSIPAR) 60 MG tablet Take 60 mg by mouth daily. 03/22/19   [provider]  ?cloNIDine (CATAPRES) 0.2 MG tablet Take 0.2 mg by mouth 2 (two) times daily. 06/27/20   [provider]  ?Continuous Blood Gluc Receiver (DEXCOM G6 RECEIVER) DEVI Use as instructed to check blood sugar daily 03/29/20   Shamleffer, Melanie Crazier, MD  ?Continuous Blood Gluc Sensor (DEXCOM G6 SENSOR) MISC 1 Device by Does not apply route as directed. 02/29/20   Shamleffer, Melanie Crazier, MD  ?Continuous Blood Gluc Transmit (DEXCOM G6 TRANSMITTER) MISC 1 Device by Does not apply route as directed. 02/29/20   Shamleffer, Melanie Crazier, MD  ?gabapentin (NEURONTIN) 100 MG capsule 1 cap PO every other day 02/26/21   Ladell Pier, MD  ?glucose blood [facility] VERIO) test strip Use as instructed to check blood sugar 3 times daily Dx is E11.65 03/23/20   Shamleffer, Melanie Crazier, MD  ?insulin aspart (NOVOLOG FLEXPEN) 100 UNIT/ML FlexPen 7 units subcut with meals 02/26/21   Ladell Pier, MD  ?insulin glargine (LANTUS SOLOSTAR) 100 UNIT/ML Solostar Pen Inject 30 Units into  the skin daily. 02/26/21   Ladell Pier, MD  ?Insulin Pen Needle 32G X 4 MM MISC 1 Device by Does not apply route in the morning, at noon, in the evening, and at bedtime. 08/31/20   Shamleffer, Melanie Crazier, MD  ?Insulin Syringe-Needle U-100 (INSULIN SYRINGE 1CC/31GX5/16") 31G X 5/16" 1 ML MISC Use as directed.  BD 32m x 31G needles 01/22/17   JLadell Pier MD  ?Insulin Syringes, Disposable, U-100 0.5 ML MISC 1 each by Does not apply route 2 (two) times daily. 01/30/14   Funches, JAdriana Mccallum MD  ?TRUEplus Lancets 28G MISC 1 each by Does not apply route 3 (three) times daily. 12/20/20   Shamleffer, IMelanie Crazier MD  ? ? ?Inpatient Medications: ?Scheduled Meds: ?  aspirin  324 mg Oral NOW  ? Or  ? aspirin  300 mg Rectal NOW  ? [START ON 04/17/2021] aspirin EC  81 mg Oral Daily  ? calcium acetate  667-2,001 mg Oral See admin instructions  ? cinacalcet  60 mg Oral Daily  ? gabapentin  100 mg Oral Daily  ? heparin  5,000 Units Subcutaneous Q8H  ? ?Continuous Infusions: ? [START ON 04/17/2021] ceFEPime (MAXIPIME) IV    ? ceFEPime (MAXIPIME) IV 2 g (04/15/2021 1425)  ? metronidazole 500 mg (05/05/2021 1426)  ? vancomycin    ? ?PRN Meds: ?acetaminophen, hydrALAZINE, nitroGLYCERIN, ondansetron (ZOFRAN) IV ? ?Allergies:    ?Allergies  ?Allergen Reactions  ? Biopatch Protective Disk-Chg [Chlorhexidine]   ?  States rash with use of CHG bath  ? Doxycycline Nausea And Vomiting  ? Victoza [Liraglutide] Nausea And Vomiting  ? ? ?Social History:   ?Social History  ? ?Socioeconomic History  ? Marital status: Single  ?  Spouse name: Not on file  ? Number of children: Not on file  ? Years of education: Not on file  ? Highest education level: Not on file  ?Occupational History  ? Not on file  ?Tobacco Use  ? Smoking status: Never  ? Smokeless tobacco: Never  ?Vaping Use  ? Vaping Use: Never used  ?Substance and Sexual Activity  ? Alcohol use: No  ? Drug use: No  ? Sexual activity: Not Currently  ?Other Topics Concern  ? Not on file   ?Social History Narrative  ? Not on file  ? ?Social Determinants of Health  ? ?Financial Resource Strain: Not on file  ?Food Insecurity: Not on file  ?Transportation Needs: Not on file  ?Physical Activity: Not on fil

## 2021-04-16 NOTE — H&P (Signed)
?History and Physical  ? ? ?Ann Bryan ZOX:[id number] DOB: 05/06/1960 DOA: 04/09/2021 ? ?PCP: Ladell Pier, MD (Confirm with patient/family/NH records and if not entered, this has to be entered at [facility] point of entry) ?Patient coming from: Home ? ?I have personally briefly reviewed patient's old medical records in [place] ? ?Chief Complaint: Feeling weak, fell down. ? ?HPI: Ann Bryan is a 61 y.o. female with medical history significant of IDDM, HTN, HLD, diabetic neuropathy, ESRD on PD, presented with syncope fall and generalized weakness. ? ?Patient started to feel generalized weakness, malaise on Friday and did not go to doctors appointment.  Which she attributed to might be a low glucose.  She started to feel lightheadedness, and very nauseous yesterday morning and vomited stomach content times 1 in the morning.  Yesterday evening, she fell down yesterday, no loss of consciousness but feeling too weak to get up she states through the night on the floor.  As result, she will missed home PT x1 last night.  This morning, neighbor called police for a well check and EMS arrived and found patient on the floor a week, heart rate 80s, glucose 123, BP 122/76, O2 saturation 98% on room air.  Patient denies any chest pain, no shortness of breath, no cough, no diarrhea.  She does not make urine anymore. ? ?ED Course: Appears to have hypothermia temperature 95.4, no tachycardia, blood pressure borderline low. ? ?Chest x-ray showed cardiomegaly and congestion. ? ?Blood work glucose 97, creatinine 16, bicarb 13, CK12 100, troponin 2200, WBC 10. ? ?Code sepsis called, patient received vancomycin cefepime and Flagyl. ? ?Review of Systems: As per HPI otherwise 10 point review of systems negative.  ? ? ?Past Medical History:  ?Diagnosis Date  ? Anemia   ? Cataract   ? right eye  ? Complication of anesthesia   ?  " I woke up itching from anesthesia many years ago."   ? Depression   ? at times  ? Diabetes mellitus  1986  ?  poorly controlled last hemoglobin A1c  10.5 on January 01, 2010, increase since June 2011 when hemoglobin A1c was 9.3 , determined to be most likely secondary to non-compliance.  ? Diabetic nephropathy ([place])   ? Diabetic retinopathy   ? Dyspnea   ? "when I have too much fluid"  ? End stage renal disease on dialysis [facility])   ? T/Th/Sat [address]  ? Glaucoma   ? History of blood transfusion   ? Hyperlipidemia   ? Hypertension   ? Nausea and vomiting   ? Neuropathy   ? Retinopathy   ? Wears glasses   ? ? ?Past Surgical History:  ?Procedure Laterality Date  ? ABDOMINAL HYSTERECTOMY   2004  ? AV FISTULA PLACEMENT Left 01/21/2019  ? Procedure: BRACHIOCEPHALIC ARTERIOVENOUS (AV) FISTULA CREATION LEFT ARM;  Surgeon: Serafina Mitchell, MD;  Location: [facility] OR;  Service: Vascular;  Laterality: Left;  ? EYE SURGERY    ? FISTULA SUPERFICIALIZATION Left 05/20/2019  ? Procedure: FISTULA SUPERFICIALIZATION LEFT PROXIMAL FISTULA;  Surgeon: Serafina Mitchell, MD;  Location: [place];  Service: Vascular;  Laterality: Left;  ? IR FLUORO GUIDE CV LINE RIGHT  11/15/2018  ? IR US GUIDE VASC ACCESS RIGHT  11/15/2018  ? MYOMECTOMY  1994  ? REFRACTIVE SURGERY Bilateral   ? ? ? reports that she has never smoked. She has never used smokeless tobacco. She reports that she does not drink alcohol and does not  use drugs. ? ?Allergies  ?Allergen Reactions  ? Biopatch Protective Disk-Chg [Chlorhexidine]   ?  States rash with use of CHG bath  ? Doxycycline Nausea And Vomiting  ? Victoza [Liraglutide] Nausea And Vomiting  ? ? ?Family History  ?Problem Relation Age of Onset  ? Diabetes Mother   ? Stroke Mother   ? Hypertension Mother   ? Alcohol abuse Mother   ? Depression Father   ?     Committed suicide  ? Alcohol abuse Maternal Uncle   ? ? ? ?Prior to Admission medications   ?Medication Sig Start Date End Date Taking? Authorizing Provider  ?amLODipine (NORVASC) 10 MG tablet Take 1 tablet (10 mg total) by mouth daily. Must have office visit for  refills 01/03/20   Charlott Rakes, MD  ?atorvastatin (LIPITOR) 20 MG tablet Take 1 tablet by mouth daily. 11/30/19   Ladell Pier, MD  ?Blood Glucose Monitoring Suppl (TRUE METRIX METER) w/Device KIT 1 each by Does not apply route as needed. ?Patient not taking: Reported on 02/29/2020 11/18/18   Ladell Pier, MD  ?calcium acetate (PHOSLO) 667 MG capsule Take 667-2,001 mg by mouth See admin instructions. 2,001 mg with meals 3 times daily and 667 mg 2 times daily with snacks    [provider]  ?carvedilol (COREG) 25 MG tablet Take 1 and 1/2 tablets by mouth twice daily with a meal. Must have office visit for refills 12/26/19   Ladell Pier, MD  ?cinacalcet (SENSIPAR) 60 MG tablet Take 60 mg by mouth daily. 03/22/19   [provider]  ?cloNIDine (CATAPRES) 0.2 MG tablet Take 0.2 mg by mouth 2 (two) times daily. 06/27/20   [provider]  ?Continuous Blood Gluc Receiver (DEXCOM G6 RECEIVER) DEVI Use as instructed to check blood sugar daily 03/29/20   Shamleffer, Melanie Crazier, MD  ?Continuous Blood Gluc Sensor (DEXCOM G6 SENSOR) MISC 1 Device by Does not apply route as directed. 02/29/20   Shamleffer, Melanie Crazier, MD  ?Continuous Blood Gluc Transmit (DEXCOM G6 TRANSMITTER) MISC 1 Device by Does not apply route as directed. 02/29/20   Shamleffer, Melanie Crazier, MD  ?gabapentin (NEURONTIN) 100 MG capsule 1 cap PO every other day 02/26/21   Ladell Pier, MD  ?glucose blood [facility] VERIO) test strip Use as instructed to check blood sugar 3 times daily Dx is E11.65 03/23/20   Shamleffer, Melanie Crazier, MD  ?insulin aspart (NOVOLOG FLEXPEN) 100 UNIT/ML FlexPen 7 units subcut with meals 02/26/21   Ladell Pier, MD  ?insulin glargine (LANTUS SOLOSTAR) 100 UNIT/ML Solostar Pen Inject 30 Units into the skin daily. 02/26/21   Ladell Pier, MD  ?Insulin Pen Needle 32G X 4 MM MISC 1 Device by Does not apply route in the morning, at noon, in the evening, and at  bedtime. 08/31/20   Shamleffer, Melanie Crazier, MD  ?Insulin Syringe-Needle U-100 (INSULIN SYRINGE 1CC/31GX5/16") 31G X 5/16" 1 ML MISC Use as directed.  BD 56m x 31G needles 01/22/17   JLadell Pier MD  ?Insulin Syringes, Disposable, U-100 0.5 ML MISC 1 each by Does not apply route 2 (two) times daily. 01/30/14   Funches, JAdriana Mccallum MD  ?TRUEplus Lancets 28G MISC 1 each by Does not apply route 3 (three) times daily. 12/20/20   Shamleffer, IMelanie Crazier MD  ? ? ?Physical Exam: ?Vitals:  ? 04/27/2021 1345 04/14/2021 1400 05/02/2021 1415 04/17/2021 1430  ?BP: 115/70 111/82 110/72 113/84  ?Pulse: 65 65 62 68  ?Resp: 16 11  16 18  ?Temp:      ?TempSrc:      ?SpO2: 100% 100% 100% 100%  ? ? ?Constitutional: NAD, calm, comfortable ?Vitals:  ? 04/20/2021 1345 05/03/2021 1400 04/15/2021 1415 04/11/2021 1430  ?BP: 115/70 111/82 110/72 113/84  ?Pulse: 65 65 62 68  ?Resp: _0 ?Temp:      ?TempSrc:      ?SpO2: 100% 100% 100% 100%  ? ?Eyes: PERRL, lids and conjunctivae normal ?ENMT: Mucous membranes are dry. Posterior pharynx clear of any exudate or lesions.Normal dentition.  ?Neck: normal, supple, no masses, no thyromegaly ?Respiratory: clear to auscultation bilaterally, no wheezing, no crackles. Normal respiratory effort. No accessory muscle use.  ?Cardiovascular: Regular rate and rhythm, no murmurs / rubs / gallops. 2+ extremity edema, left>right. 2+ pedal pulses. No carotid bruits.  ?Abdomen: no tenderness, no masses palpated. No hepatosplenomegaly. Bowel sounds positive.  ?Musculoskeletal: no clubbing / cyanosis. No joint deformity upper and lower extremities. Good ROM, no contractures. Normal muscle tone.  ?Skin: no rashes, lesions, ulcers. No induration ?Neurologic: CN 2-12 grossly intact. Sensation intact, DTR normal. Strength 5/5 in all 4.  ?Psychiatric: Normal judgment and insight. Alert and oriented x 3. Normal mood.  ? ? ?Labs on Admission: I have personally reviewed following labs and imaging studies ? ?CBC: ?Recent  Labs  ?Lab 05/02/2021 ?1209 04/21/2021 ?1234  ?WBC 10.0  --   ?NEUTROABS 8.2*  --   ?HGB 15.8* 16.7*  ?HCT 46.5* 49.0*  ?MCV 85.2  --   ?PLT 276  --   ? ?Basic Metabolic Panel: ?Recent Labs  ?Lab 04/30/2021 ?1202 04/11/

## 2021-04-17 ENCOUNTER — Inpatient Hospital Stay (HOSPITAL_COMMUNITY): Payer: Medicare Other

## 2021-04-17 DIAGNOSIS — R609 Edema, unspecified: Secondary | ICD-10-CM | POA: Diagnosis not present

## 2021-04-17 DIAGNOSIS — I214 Non-ST elevation (NSTEMI) myocardial infarction: Secondary | ICD-10-CM | POA: Diagnosis not present

## 2021-04-17 LAB — BASIC METABOLIC PANEL
Anion gap: 19 — ABNORMAL HIGH (ref 5–15)
BUN: 120 mg/dL — ABNORMAL HIGH (ref 6–20)
CO2: 16 mmol/L — ABNORMAL LOW (ref 22–32)
Calcium: 5.7 mg/dL — CL (ref 8.9–10.3)
Chloride: 95 mmol/L — ABNORMAL LOW (ref 98–111)
Creatinine, Ser: 16.37 mg/dL — ABNORMAL HIGH (ref 0.44–1.00)
GFR, Estimated: 2 mL/min — ABNORMAL LOW (ref >60)
Glucose, Bld: 228 mg/dL — ABNORMAL HIGH (ref 70–99)
Potassium: 4.3 mmol/L (ref 3.5–5.1)
Sodium: 130 mmol/L — ABNORMAL LOW (ref 135–145)

## 2021-04-17 LAB — GLUCOSE, CAPILLARY
Glucose-Capillary: 202 mg/dL — ABNORMAL HIGH (ref 70–99)
Glucose-Capillary: 237 mg/dL — ABNORMAL HIGH (ref 70–99)
Glucose-Capillary: 281 mg/dL — ABNORMAL HIGH (ref 70–99)

## 2021-04-17 LAB — CBC
HCT: 37.2 % (ref 36.0–46.0)
Hemoglobin: 12.6 g/dL (ref 12.0–15.0)
MCH: 28.6 pg (ref 26.0–34.0)
MCHC: 33.9 g/dL (ref 30.0–36.0)
MCV: 84.5 fL (ref 80.0–100.0)
Platelets: 251 10*3/uL (ref 150–400)
RBC: 4.4 MIL/uL (ref 3.87–5.11)
RDW: 13.5 % (ref 11.5–15.5)
WBC: 11 10*3/uL — ABNORMAL HIGH (ref 4.0–10.5)
nRBC: 0.5 % — ABNORMAL HIGH (ref 0.0–0.2)

## 2021-04-17 LAB — CK: Total CK: 526 U/L — ABNORMAL HIGH (ref 38–234)

## 2021-04-17 LAB — TROPONIN I (HIGH SENSITIVITY)
Troponin I (High Sensitivity): 2531 ng/L (ref <18)
Troponin I (High Sensitivity): 2204 ng/L (ref <18)

## 2021-04-17 LAB — HEMOGLOBIN A1C
Hgb A1c MFr Bld: >15.5 % — ABNORMAL HIGH (ref 4.8–5.6)
Mean Plasma Glucose: >398 mg/dL

## 2021-04-17 LAB — MRSA NEXT GEN BY PCR, NASAL: MRSA by PCR Next Gen: NOT DETECTED

## 2021-04-17 LAB — CBG MONITORING, ED: Glucose-Capillary: 163 mg/dL — ABNORMAL HIGH (ref 70–99)

## 2021-04-17 LAB — PROCALCITONIN: Procalcitonin: 1.17 ng/mL

## 2021-04-17 MED ORDER — PROCHLORPERAZINE EDISYLATE 10 MG/2ML IJ SOLN
10.0000 mg | Freq: Four times a day (QID) | INTRAMUSCULAR | Status: DC | PRN
  Administered 2021-04-19: 10 mg via INTRAVENOUS
  Filled 2021-04-17 (×2): qty 2

## 2021-04-17 MED ORDER — DELFLEX-LC/1.5% DEXTROSE 344 MOSM/L IP SOLN: INTRAPERITONEAL | Status: DC

## 2021-04-17 MED ORDER — BRIMONIDINE TARTRATE 0.2 % OP SOLN
1.0000 [drp] | Freq: Two times a day (BID) | OPHTHALMIC | Status: DC
  Administered 2021-04-17 – 2021-04-28 (×21): 1 [drp] via OPHTHALMIC
  Filled 2021-04-17 (×3): qty 5

## 2021-04-17 MED ORDER — GENTAMICIN SULFATE 0.1 % EX CREA
1.0000 "application " | TOPICAL_CREAM | Freq: Every day | CUTANEOUS | Status: DC
  Administered 2021-04-19 – 2021-04-20 (×2): 1 via TOPICAL
  Filled 2021-04-17 (×2): qty 15

## 2021-04-17 NOTE — Progress Notes (Signed)
? ?Progress Note ? ?Patient Name: Ann Bryan ?Date of Encounter: 04/17/2021 ? ?[facility] Cardiologist: New ? ?Subjective  ? ?PT denies CP  Breathing is OK in bed  Feels some better than yesterday  ? ?Inpatient Medications  ?  ?Scheduled Meds: ? aspirin  324 mg Oral NOW  ? Or  ? aspirin  300 mg Rectal NOW  ? aspirin EC  81 mg Oral Daily  ? calcitRIOL  0.5 mcg Oral Daily  ? calcium acetate  2,001 mg Oral TID WC  ? gabapentin  100 mg Oral Daily  ? gentamicin cream  1 application. Topical Daily  ? heparin  5,000 Units Subcutaneous Q8H  ? insulin aspart  0-6 Units Subcutaneous TID WC  ? ?Continuous Infusions: ? ceFEPime (MAXIPIME) IV    ? dialysis solution 1.5% low-MG/low-CA Stopped (04/25/2021 1948)  ? dialysis solution 2.5% low-MG/low-CA Stopped (04/25/2021 1948)  ? ?PRN Meds: ?acetaminophen, calcium acetate, hydrALAZINE, nitroGLYCERIN, ondansetron (ZOFRAN) IV  ? ?Vital Signs  ?  ?Vitals:  ? 04/17/21 0400 04/17/21 0500 04/17/21 0557 04/17/21 0700  ?BP: 123/73 118/81  (!) 143/99  ?Pulse: 74 76  73  ?Resp: 19 20  19   ?Temp:   98.5 ?F (36.9 ?C)   ?TempSrc:   Oral   ?SpO2: 98% 99%  99%  ? ?No intake or output data in the 24 hours ending 04/17/21 0715 ? ?  03/28/2021  ?  1:11 PM 02/26/2021  ?  4:19 PM 02/29/2020  ? 11:17 AM  ?Last 3 Weights  ?Weight (lbs) 142 lb 141 lb 3.2 oz 136 lb 8 oz  ?Weight (kg) 64.411 kg 64.048 kg 61.916 kg  ?   ? ?Telemetry  ?  ?SR - Personally Reviewed ? ?ECG  ?  ?No new  - Personally Reviewed ? ?Physical Exam  ? ?GEN: No acute distress.   ?Neck: No JVD ?Cardiac: RRR, no murmurs  ?Respiratory: Clear to auscultation bilaterally. ?GI: Soft, nontender, non-distended  ?MS: No edema; No deformity. ?Neuro:  Nonfocal  ?Psych: Normal affect  ? ?Labs  ?  ?High Sensitivity Troponin:   ?Recent Labs  ?Lab 05/02/2021 ?1202 04/17/2021 ?2003 04/17/21 ?0352  ?TROPONINIHS 2,296* 1,704* 2,531*  ?   ?Chemistry ?Recent Labs  ?Lab 05/05/2021 ?1202 05/05/2021 ?1234 04/27/2021 ?2002 04/17/21 ?0352  ?NA 132* 128* 126* 130*  ?K 4.7  4.5 4.0 4.3  ?CL 93* 94*  --  95*  ?CO2 13*  --   --  16*  ?GLUCOSE 97 95  --  228*  ?BUN 114* >130*  --  120*  ?CREATININE 16.03* 16.40*  --  16.37*  ?CALCIUM 6.5*  --   --  5.7*  ?MG 1.9  --   --   --   ?PROT 5.3*  --   --   --   ?ALBUMIN 2.0*  --   --   --   ?AST 27  --   --   --   ?ALT 15  --   --   --   ?ALKPHOS 171*  --   --   --   ?BILITOT 0.8  --   --   --   ?GFRNONAA 2*  --   --  2*  ?ANIONGAP 26*  --   --  19*  ?  ?Lipids No results for input(s): CHOL, TRIG, HDL, LABVLDL, LDLCALC, CHOLHDL in the last 168 hours.  ?Hematology ?Recent Labs  ?Lab 04/10/2021 ?1209 04/15/2021 ?1234 04/07/2021 ?2002 04/17/21 ?0352  ?WBC 10.0  --   --  11.0*  ?RBC 5.46*  --   --  4.40  ?HGB 15.8* 16.7* 13.6 12.6  ?HCT 46.5* 49.0* 40.0 37.2  ?MCV 85.2  --   --  84.5  ?MCH 28.9  --   --  28.6  ?MCHC 34.0  --   --  33.9  ?RDW 13.3  --   --  13.5  ?PLT 276  --   --  251  ? ?Thyroid  ?Recent Labs  ?Lab 04/15/2021 ?1202  ?TSH 0.724  ?  ?BNPNo results for input(s): BNP, PROBNP in the last 168 hours.  ?DDimer No results for input(s): DDIMER in the last 168 hours.  ? ?Radiology  ?  ?CT Head Wo Contrast ? ?Result Date: 04/17/2021 ?CLINICAL DATA:  Acute neuro deficit rule out stroke EXAM: CT HEAD WITHOUT CONTRAST TECHNIQUE: Contiguous axial images were obtained from the base of the skull through the vertex without intravenous contrast. RADIATION DOSE REDUCTION: This exam was performed according to the departmental dose-optimization program which includes automated exposure control, adjustment of the mA and/or kV according to patient size and/or use of iterative reconstruction technique. COMPARISON:  None. FINDINGS: Brain: Mild atrophy without hydrocephalus. Patchy white matter hypodensity throughout both cerebral hemispheres. Negative for acute infarct, hemorrhage, mass Vascular: Negative for hyperdense vessel. Atherosclerotic calcification in the carotid and vertebral arteries bilaterally. Skull: Negative Sinuses/Orbits: Mild mucosal edema paranasal  sinuses. Bilateral cataract extraction Other: None IMPRESSION: No acute abnormality Mild atrophy and moderate chronic microvascular ischemic change Electronically Signed   By: Franchot Gallo M.D.   On: 04/17/2021 13:21  ? ?DG Chest Portable 1 View ? ?Result Date: 04/09/2021 ?CLINICAL DATA:  Weakness, peritoneal dialysis patient EXAM: PORTABLE CHEST 1 VIEW COMPARISON:  03/28/2021 FINDINGS: Similar low lung volumes with cardiac enlargement and central vascular congestion. Basilar atelectasis suspected. No large effusion or definite CHF pattern. No pneumothorax. Trachea midline. Aorta atherosclerotic. Degenerative changes noted of the spine. IMPRESSION: Similar low lung volumes with cardiomegaly and vascular congestion. Basilar atelectasis. Aortic Atherosclerosis (ICD10-I70.0). Electronically Signed   By: Jerilynn Mages.  Shick M.D.   On: 04/22/2021 12:12  ? ?ECHOCARDIOGRAM COMPLETE ? ?Result Date: 04/20/2021 ?   ECHOCARDIOGRAM REPORT   Patient Name:   Ann Bryan Date of Exam: 04/21/2021 Medical Rec #:  [id number]    Height:       60.0 in Accession #:    [id number]   Weight:       142.0 lb Date of Birth:  13-Oct-1960   BSA:          1.614 m? Patient Age:    61 years     BP:           111/80 mmHg Patient Gender: F            HR:           64 bpm. Exam Location:  Inpatient Procedure: 2D Echo, 3D Echo, Cardiac Doppler, Color Doppler and Intracardiac            Opacification Agent Indications:    NSTEMI I21.4  History:        Patient has no prior history of Echocardiogram examinations.                 Signs/Symptoms:Dyspnea; Risk Factors:Diabetes, Dyslipidemia and                 Hypertension.  Sonographer:    Bernadene Person RDCS Referring Phys: [id number] [place]  1. Left ventricular ejection fraction by 3D  volume is 21 %. The left ventricle has severely decreased function. The left ventricle demonstrates global hypokinesis. Left ventricular diastolic parameters are consistent with Grade I diastolic dysfunction (impaired  relaxation). Elevated left ventricular end-diastolic pressure.  2. Right ventricular systolic function is moderately reduced. The right ventricular size is mildly enlarged. There is normal pulmonary artery systolic pressure.  3. Left atrial size was mildly dilated.  4. There is small to moderate circumferential pericardial effusion. There is no evidence of cardiac tamponade.  5. The mitral valve is normal in structure. Mild mitral valve regurgitation. No evidence of mitral stenosis.  6. The aortic valve is normal in structure. Aortic valve regurgitation is not visualized. No aortic stenosis is present.  7. The inferior vena cava is normal in size with greater than 50% respiratory variability, suggesting right atrial pressure of 3 mmHg. Comparison(s): No prior Echocardiogram. FINDINGS  Left Ventricle: Left ventricular ejection fraction by 3D volume is 21 %. The left ventricle has severely decreased function. The left ventricle demonstrates global hypokinesis. Definity contrast agent was given IV to delineate the left ventricular endocardial borders. The left ventricular internal cavity size was normal in size. There is no left ventricular hypertrophy. Left ventricular diastolic parameters are consistent with Grade I diastolic dysfunction (impaired relaxation). Elevated left ventricular end-diastolic pressure. Right Ventricle: The right ventricular size is mildly enlarged. No increase in right ventricular wall thickness. Right ventricular systolic function is moderately reduced. There is normal pulmonary artery systolic pressure. The tricuspid regurgitant velocity is 1.35 m/s, and with an assumed right atrial pressure of 3 mmHg, the estimated right ventricular systolic pressure is 15.7 mmHg. Left Atrium: Left atrial size was mildly dilated. Right Atrium: Right atrial size was normal in size. Pericardium: There is small to moderate circumferential pericardial effusion. There is no evidence of cardiac tamponade. Mitral  Valve: The mitral valve is normal in structure. Mild mitral valve regurgitation. No evidence of mitral valve stenosis. Tricuspid Valve: The tricuspid valve is normal in structure. Tricuspid valve regur

## 2021-04-17 NOTE — Progress Notes (Addendum)
?[facility] ?Progress Note  ? ?Subjective:   Pt was unable to get PD last night because she was still in the ED. Reports she ate this AM then vomited, but thinks it was because of "bad coffee." She denies any shortness of breath, chest pain, palpitations, dizziness, abdominal pain, N/V/D. Reports she had been weak and not herself lately but cannot specify further. She denies missing any PD-  is not sure of what her clearances are as an OP  ? ?Objective ?Vitals:  ? 04/17/21 0700 04/17/21 0819 04/17/21 0822 04/17/21 0900  ?BP: (!) 143/99 110/71  93/64  ?Pulse: 73 78  71  ?Resp: 19 19  17   ?Temp:   97.6 ?F (36.4 ?C)   ?TempSrc:   Oral   ?SpO2: 99% 100%  98%  ? ?Physical Exam ?General: alert female in NAD ?Heart: RRR, no murmurs, rubs or gallops ?Lungs: CTA bilaterally without wheezing, rhonchi or rales ?Abdomen: Soft, non-tender, non-distended, +BS ?Extremities: trace edema left lower extremity ?Dialysis Access: PD cath in lower abdomen without surrounding tenderness, redness or drainage ? ?Additional Objective ?Labs: ?Basic Metabolic Panel: ?Recent Labs  ?Lab 04/14/2021 ?1202 04/22/2021 ?1234 05/02/2021 ?2002 04/17/21 ?0352  ?NA 132* 128* 126* 130*  ?K 4.7 4.5 4.0 4.3  ?CL 93* 94*  --  95*  ?CO2 13*  --   --  16*  ?GLUCOSE 97 95  --  228*  ?BUN 114* >130*  --  120*  ?CREATININE 16.03* 16.40*  --  16.37*  ?CALCIUM 6.5*  --   --  5.7*  ?PHOS 10.8*  --   --   --   ? ?Liver Function Tests: ?Recent Labs  ?Lab 04/29/2021 ?1202  ?AST 27  ?ALT 15  ?ALKPHOS 171*  ?BILITOT 0.8  ?PROT 5.3*  ?ALBUMIN 2.0*  ? ? ?CBC: ?Recent Labs  ?Lab 04/13/2021 ?1209 04/06/2021 ?1234 04/10/2021 ?2002 04/17/21 ?0352  ?WBC 10.0  --   --  11.0*  ?NEUTROABS 8.2*  --   --   --   ?HGB 15.8* 16.7* 13.6 12.6  ?HCT 46.5* 49.0* 40.0 37.2  ?MCV 85.2  --   --  84.5  ?PLT 276  --   --  251  ? ?Blood Culture ?   ?Component Value Date/Time  ? SDES BLOOD SITE NOT SPECIFIED 05/01/2021 1202  ? SPECREQUEST  04/22/2021 1202  ?  BOTTLES DRAWN AEROBIC AND  ANAEROBIC Blood Culture results may not be optimal due to an inadequate volume of blood received in culture bottles  ? CULT  04/17/2021 1202  ?  NO GROWTH < 24 HOURS ?Performed at [facility], [place] [address], [place], [place] [address] ?  ? REPTSTATUS PENDING 04/17/2021 1202  ? ? ?Cardiac Enzymes: ?Recent Labs  ?Lab 05/03/2021 ?1202 04/17/21 ?0352  ?CKTOTAL 1,209* 526*  ? ?CBG: ?Recent Labs  ?Lab 05/05/2021 ?1721 04/30/2021 ?1836 04/10/2021 ?1944 04/14/2021 ?2201 04/17/21 ?9741  ?GLUCAP 18* 105* 254* 137* 163*  ? ?Iron Studies: No results for input(s): IRON, TIBC, TRANSFERRIN, FERRITIN in the last 72 hours. ?@lablastinr3 @ ?Studies/Results: ?CT Head Wo Contrast ? ?Result Date: 05/01/2021 ?CLINICAL DATA:  Acute neuro deficit rule out stroke EXAM: CT HEAD WITHOUT CONTRAST TECHNIQUE: Contiguous axial images were obtained from the base of the skull through the vertex without intravenous contrast. RADIATION DOSE REDUCTION: This exam was performed according to the departmental dose-optimization program which includes automated exposure control, adjustment of the mA and/or kV according to patient size and/or use of iterative reconstruction technique. COMPARISON:  None. FINDINGS: Brain: Mild atrophy without hydrocephalus. Patchy white matter hypodensity throughout both cerebral hemispheres. Negative for acute infarct, hemorrhage, mass Vascular: Negative for hyperdense vessel. Atherosclerotic calcification in the carotid and vertebral arteries bilaterally. Skull: Negative Sinuses/Orbits: Mild mucosal edema paranasal sinuses. Bilateral cataract extraction Other: None IMPRESSION: No acute abnormality Mild atrophy and moderate chronic microvascular ischemic change Electronically Signed   By: Franchot Gallo M.D.   On: 05/04/2021 13:21  ? ?DG Chest Portable 1 View ? ?Result Date: 04/27/2021 ?CLINICAL DATA:  Weakness, peritoneal dialysis patient EXAM: PORTABLE CHEST 1 VIEW COMPARISON:  03/28/2021 FINDINGS: Similar low lung volumes with  cardiac enlargement and central vascular congestion. Basilar atelectasis suspected. No large effusion or definite CHF pattern. No pneumothorax. Trachea midline. Aorta atherosclerotic. Degenerative changes noted of the spine. IMPRESSION: Similar low lung volumes with cardiomegaly and vascular congestion. Basilar atelectasis. Aortic Atherosclerosis (ICD10-I70.0). Electronically Signed   By: Jerilynn Mages.  Shick M.D.   On: 04/15/2021 12:12  ? ?ECHOCARDIOGRAM COMPLETE ? ?Result Date: 05/01/2021 ?   ECHOCARDIOGRAM REPORT   Patient Name:   Ann Bryan Date of Exam: 04/08/2021 Medical Rec #:  [id number]    Height:       60.0 in Accession #:    [id number]   Weight:       142.0 lb Date of Birth:  1960/01/27   BSA:          1.614 m? Patient Age:    61 years     BP:           111/80 mmHg Patient Gender: F            HR:           64 bpm. Exam Location:  Inpatient Procedure: 2D Echo, 3D Echo, Cardiac Doppler, Color Doppler and Intracardiac            Opacification Agent Indications:    NSTEMI I21.4  History:        Patient has no prior history of Echocardiogram examinations.                 Signs/Symptoms:Dyspnea; Risk Factors:Diabetes, Dyslipidemia and                 Hypertension.  Sonographer:    Bernadene Person RDCS Referring Phys: [id number] [place]  1. Left ventricular ejection fraction by 3D volume is 21 %. The left ventricle has severely decreased function. The left ventricle demonstrates global hypokinesis. Left ventricular diastolic parameters are consistent with Grade I diastolic dysfunction (impaired relaxation). Elevated left ventricular end-diastolic pressure.  2. Right ventricular systolic function is moderately reduced. The right ventricular size is mildly enlarged. There is normal pulmonary artery systolic pressure.  3. Left atrial size was mildly dilated.  4. There is small to moderate circumferential pericardial effusion. There is no evidence of cardiac tamponade.  5. The mitral valve is normal in structure.  Mild mitral valve regurgitation. No evidence of mitral stenosis.  6. The aortic valve is normal in structure. Aortic valve regurgitation is not visualized. No aortic stenosis is present.  7. The inferior vena cava is normal in size with greater than 50% respiratory variability, suggesting right atrial pressure of 3 mmHg. Comparison(s): No prior Echocardiogram. FINDINGS  Left Ventricle: Left ventricular ejection fraction by 3D volume is 21 %. The left ventricle has severely decreased function. The left ventricle demonstrates global hypokinesis. Definity contrast agent was given IV to delineate the left ventricular endocardial borders. The left ventricular internal cavity  size was normal in size. There is no left ventricular hypertrophy. Left ventricular diastolic parameters are consistent with Grade I diastolic dysfunction (impaired relaxation). Elevated left ventricular end-diastolic pressure. Right Ventricle: The right ventricular size is mildly enlarged. No increase in right ventricular wall thickness. Right ventricular systolic function is moderately reduced. There is normal pulmonary artery systolic pressure. The tricuspid regurgitant velocity is 1.35 m/s, and with an assumed right atrial pressure of 3 mmHg, the estimated right ventricular systolic pressure is 83.4 mmHg. Left Atrium: Left atrial size was mildly dilated. Right Atrium: Right atrial size was normal in size. Pericardium: There is small to moderate circumferential pericardial effusion. There is no evidence of cardiac tamponade. Mitral Valve: The mitral valve is normal in structure. Mild mitral valve regurgitation. No evidence of mitral valve stenosis. Tricuspid Valve: The tricuspid valve is normal in structure. Tricuspid valve regurgitation is mild . No evidence of tricuspid stenosis. Aortic Valve: The aortic valve is normal in structure. Aortic valve regurgitation is not visualized. No aortic stenosis is present. Pulmonic Valve: The pulmonic valve  was normal in structure. Pulmonic valve regurgitation is trivial. No evidence of pulmonic stenosis. Aorta: The aortic root is normal in size and structure. Venous: The inferior vena cava is normal in size with

## 2021-04-17 NOTE — Telephone Encounter (Signed)
Labs has been refaxed this morning  ?

## 2021-04-17 NOTE — ED Notes (Signed)
Pt reported feeling nauseous from the coffee she was given - pt with episode of vomiting - PRN zofran given - pt would like to hold heparin ?

## 2021-04-17 NOTE — Progress Notes (Signed)
BLE venous duplex has been completed. ? ? ?Results can be found under chart review under CV PROC. ?04/17/2021 11:56 AM ?Giovani Neumeister RVT, RDMS ? ?

## 2021-04-17 NOTE — Progress Notes (Signed)
PT Cancellation Note ? ?Patient Details ?Name: Ann Bryan ?MRN: [id number] ?DOB: 1960/01/31 ? ? ?Cancelled Treatment:    Reason Eval/Treat Not Completed: Other (comment). Pt with bouts of emesis currently. Will plan to follow-up tomorrow as able. ? ? ?Moishe Spice, PT, DPT ?Acute Rehabilitation Services  ?Pager: [phone] ?Office: [phone] ? ? ? ?[name] ?04/17/2021, 4:04 PM ? ? ?

## 2021-04-17 NOTE — ED Notes (Signed)
MD Rathore paged of critical calcium  ?

## 2021-04-17 NOTE — Progress Notes (Signed)
? Ann Bryan  WCB:[id number] DOB: 1960-02-16 DOA: 04/25/2021 ?PCP: Ladell Pier, MD   ? ?Brief Narrative:  ?61 year old with a history of DM2, HTN, HLD, diabetic neuropathy, and ESRD on PD who presented to the ED with severe generalized weakness for approximately 6 to 7 days and an eventual pre-syncopal spell resulting in a fall without loss of consciousness.  After her fall she was too weak to get up and spent the night on the floor, thusly missing her PD.  A neighbor called EMS who arrived to find the patient on the floor with stable vital signs and a normal CBG.  In the ED the patient was hypothermic with temperature 95.4.  WBC was borderline at 10. ? ?Consultants:  ?Cardiology ?Nephrology ? ?Code Status: FULL CODE ? ?DVT prophylaxis: ?Subcutaneous heparin ? ?Interim Hx: ?The patient had an episode of nausea with vomiting while waiting in the ED this morning.  She was not able to get her PD while in the ER.  She is afebrile.  Vital signs are stable at present.  Saturation 100% on room air.  CBG was noted to be quite low at 5:20PM yesterday but since that time has been between 137 and 254.  BUN is markedly elevated at 120. ? ?Assessment & Plan: ? ?Near syncope/fall/generalized weakness ?Possibly related to a hypoglycemic episode with the patient's insulin recently having been increased - check A1c -decrease insulin dose -follow CBG -suspect generalized weakness is due to insufficient dialysis/uremia ? ?Possible sepsis ?This diagnosis was based on hypothermia presentation as well as elevated lactate -these findings could also be easily explained based simply on the fact that she slept on the floor all night prior to her presentation -discontinue antibiotics for now with low threshold to resume if further work-up suggest active infection ? ?Mild rhabdomyolysis ?CK 1209 at presentation after laying in floor all night ? ?DM2 ?As above her home insulin regimen was recently increased from 26 of Lantus to 30  units and meal coverage from 4 units to 7 units -utilizing SSI only for now -follow CBG trend closely ? ?NSTEMI ?Cardiology following -BB on hold due to low blood pressure and mild bradycardia -initial troponin at presentation quite elevated at 2296 -felt to likely be due to the fact that the patient laid on the floor for night -no chest pain or dyspnea presently -TTE pending ? ?Peripheral edema ?Should be corrected with resumption of dialysis -no DVT on venous duplex ? ?ESRD on PD ?Resuming usual PD under guidance of nephrology -there are concerns that PD is not working well for her but reportedly the patient is vehemently opposed to HD ? ? ?Family Communication: No family present at time of exam ?Disposition: From home -anticipate eventual discharge home but likely will require 3-4 days of hospitalization ? ?Objective: ?Blood pressure (!) 143/99, pulse 73, temperature 98.5 ?F (36.9 ?C), temperature source Oral, resp. rate 19, last menstrual period 03/13/2002, SpO2 99 %. ?No intake or output data in the 24 hours ending 04/17/21 0815 ?There were no vitals filed for this visit. ? ?Examination: ?General: No acute respiratory distress at rest ?Lungs: Poor breath sounds in bilateral bases with mild crackles but with good air movement throughout other fields ?Cardiovascular: Regular rate and rhythm without murmur gallop or rub normal S1 and S2 ?Abdomen: Nontender, nondistended, soft, bowel sounds positive, no rebound, no ascites, no appreciable mass ?Extremities: 1+ bilateral lower extremity edema ? ?CBC: ?Recent Labs  ?Lab 04/22/2021 ?1209 04/12/2021 ?1234 04/27/2021 ?2002 04/17/21 ?0352  ?  WBC 10.0  --   --  11.0*  ?NEUTROABS 8.2*  --   --   --   ?HGB 15.8* 16.7* 13.6 12.6  ?HCT 46.5* 49.0* 40.0 37.2  ?MCV 85.2  --   --  84.5  ?PLT 276  --   --  251  ? ?Basic Metabolic Panel: ?Recent Labs  ?Lab 04/20/2021 ?1202 04/29/2021 ?1234 04/22/2021 ?2002 04/17/21 ?0352  ?NA 132* 128* 126* 130*  ?K 4.7 4.5 4.0 4.3  ?CL 93* 94*  --  95*  ?CO2  13*  --   --  16*  ?GLUCOSE 97 95  --  228*  ?BUN 114* >130*  --  120*  ?CREATININE 16.03* 16.40*  --  16.37*  ?CALCIUM 6.5*  --   --  5.7*  ?MG 1.9  --   --   --   ?PHOS 10.8*  --   --   --   ? ?GFR: ?CrCl cannot be calculated (Unknown ideal weight.). ? ?Liver Function Tests: ?Recent Labs  ?Lab 04/07/2021 ?1202  ?AST 27  ?ALT 15  ?ALKPHOS 171*  ?BILITOT 0.8  ?PROT 5.3*  ?ALBUMIN 2.0*  ? ? ?Cardiac Enzymes: ?Recent Labs  ?Lab 04/22/2021 ?1202 04/17/21 ?0352  ?CKTOTAL 1,209* 526*  ? ? ?HbA1C: ?Hemoglobin A1C  ?Date/Time Value Ref Range Status  ?02/29/2020 11:26 AM 13.9 (A) 4.0 - 5.6 % Final  ?11/24/2019 10:59 AM 10.3 (A) 4.0 - 5.6 % Final  ? ?HbA1c, POC (controlled diabetic range)  ?Date/Time Value Ref Range Status  ?02/26/2021 04:28 PM 12.2 (A) 0.0 - 7.0 % Final  ?11/18/2018 02:02 PM 7.3 (A) 0.0 - 7.0 % Final  ? ?Hgb A1c MFr Bld  ?Date/Time Value Ref Range Status  ?05/06/2019 12:22 PM 9.8 (H) 4.8 - 5.6 % Final  ?  Comment:  ?           Prediabetes: 5.7 - 6.4 ?         Diabetes: >6.4 ?         Glycemic control for adults with diabetes: <7.0 ?  ?01/21/2019 12:25 PM 7.8 (H) 4.8 - 5.6 % Final  ?  Comment:  ?  (NOTE) ?Pre diabetes:          5.7%-6.4% ?Diabetes:              >6.4% ?Glycemic control for   <7.0% ?adults with diabetes ?  ? ? ?CBG: ?Recent Labs  ?Lab 04/20/2021 ?1147 04/25/2021 ?1721 04/15/2021 ?1836 04/07/2021 ?1944 04/09/2021 ?2201  ?GLUCAP 119* 18* 105* 254* 137*  ? ? ?Scheduled Meds: ? aspirin  324 mg Oral NOW  ? Or  ? aspirin  300 mg Rectal NOW  ? aspirin EC  81 mg Oral Daily  ? calcitRIOL  0.5 mcg Oral Daily  ? calcium acetate  2,001 mg Oral TID WC  ? gabapentin  100 mg Oral Daily  ? gentamicin cream  1 application. Topical Daily  ? heparin  5,000 Units Subcutaneous Q8H  ? insulin aspart  0-6 Units Subcutaneous TID WC  ? ?Continuous Infusions: ? ceFEPime (MAXIPIME) IV    ? dialysis solution 1.5% low-MG/low-CA Stopped (05/01/2021 1948)  ? dialysis solution 2.5% low-MG/low-CA Stopped (04/15/2021 1948)  ? ? ? LOS: 1 day   ? ?Cherene Altes, MD ?[facility] ?Office  [phone] ?Pager - Text Page per Shea Evans ? ?If 7PM-7AM, please contact night-coverage per Amion ?04/17/2021, 8:15 AM ? ? ? ?

## 2021-04-17 NOTE — ED Notes (Signed)
MD Rathore notified of critical calcium ?

## 2021-04-18 ENCOUNTER — Encounter (HOSPITAL_COMMUNITY): Payer: Self-pay | Admitting: Interventional Cardiology

## 2021-04-18 ENCOUNTER — Encounter (HOSPITAL_COMMUNITY): Admission: EM | Disposition: E | Payer: Self-pay | Source: Other Acute Inpatient Hospital | Attending: Internal Medicine

## 2021-04-18 DIAGNOSIS — I5020 Unspecified systolic (congestive) heart failure: Secondary | ICD-10-CM

## 2021-04-18 DIAGNOSIS — R778 Other specified abnormalities of plasma proteins: Secondary | ICD-10-CM

## 2021-04-18 DIAGNOSIS — R570 Cardiogenic shock: Secondary | ICD-10-CM | POA: Diagnosis not present

## 2021-04-18 DIAGNOSIS — I2510 Atherosclerotic heart disease of native coronary artery without angina pectoris: Secondary | ICD-10-CM | POA: Diagnosis not present

## 2021-04-18 DIAGNOSIS — I214 Non-ST elevation (NSTEMI) myocardial infarction: Secondary | ICD-10-CM | POA: Diagnosis not present

## 2021-04-18 HISTORY — PX: RIGHT/LEFT HEART CATH AND CORONARY ANGIOGRAPHY: CATH118266

## 2021-04-18 LAB — POCT I-STAT EG7
Acid-base deficit: 5 mmol/L — ABNORMAL HIGH (ref 0.0–2.0)
Acid-base deficit: 6 mmol/L — ABNORMAL HIGH (ref 0.0–2.0)
Bicarbonate: 21 mmol/L (ref 20.0–28.0)
Bicarbonate: 21.5 mmol/L (ref 20.0–28.0)
Calcium, Ion: 0.84 mmol/L — CL (ref 1.15–1.40)
Calcium, Ion: 0.86 mmol/L — CL (ref 1.15–1.40)
HCT: 36 % (ref 36.0–46.0)
HCT: 37 % (ref 36.0–46.0)
Hemoglobin: 12.2 g/dL (ref 12.0–15.0)
Hemoglobin: 12.6 g/dL (ref 12.0–15.0)
O2 Saturation: 57 %
O2 Saturation: 58 %
Potassium: 3.9 mmol/L (ref 3.5–5.1)
Potassium: 4 mmol/L (ref 3.5–5.1)
Sodium: 129 mmol/L — ABNORMAL LOW (ref 135–145)
Sodium: 130 mmol/L — ABNORMAL LOW (ref 135–145)
TCO2: 22 mmol/L (ref 22–32)
TCO2: 23 mmol/L (ref 22–32)
pCO2, Ven: 44.6 mmHg (ref 44–60)
pCO2, Ven: 45.1 mmHg (ref 44–60)
pH, Ven: 7.281 (ref 7.25–7.43)
pH, Ven: 7.286 (ref 7.25–7.43)
pO2, Ven: 33 mmHg (ref 32–45)
pO2, Ven: 34 mmHg (ref 32–45)

## 2021-04-18 LAB — POCT I-STAT 7, (LYTES, BLD GAS, ICA,H+H)
Acid-base deficit: 10 mmol/L — ABNORMAL HIGH (ref 0.0–2.0)
Bicarbonate: 16.5 mmol/L — ABNORMAL LOW (ref 20.0–28.0)
Calcium, Ion: 0.79 mmol/L — CL (ref 1.15–1.40)
HCT: 36 % (ref 36.0–46.0)
Hemoglobin: 12.2 g/dL (ref 12.0–15.0)
O2 Saturation: 97 %
Potassium: 3.5 mmol/L (ref 3.5–5.1)
Sodium: 116 mmol/L — CL (ref 135–145)
TCO2: 18 mmol/L — ABNORMAL LOW (ref 22–32)
pCO2 arterial: 39.5 mmHg (ref 32–48)
pH, Arterial: 7.23 — ABNORMAL LOW (ref 7.35–7.45)
pO2, Arterial: 106 mmHg (ref 83–108)

## 2021-04-18 LAB — COMPREHENSIVE METABOLIC PANEL
ALT: 17 U/L (ref 0–44)
AST: 16 U/L (ref 15–41)
Albumin: <1.5 g/dL — ABNORMAL LOW (ref 3.5–5.0)
Alkaline Phosphatase: 192 U/L — ABNORMAL HIGH (ref 38–126)
Anion gap: 19 — ABNORMAL HIGH (ref 5–15)
BUN: 115 mg/dL — ABNORMAL HIGH (ref 6–20)
CO2: 15 mmol/L — ABNORMAL LOW (ref 22–32)
Calcium: 6.3 mg/dL — CL (ref 8.9–10.3)
Chloride: 95 mmol/L — ABNORMAL LOW (ref 98–111)
Creatinine, Ser: 15.04 mg/dL — ABNORMAL HIGH (ref 0.44–1.00)
GFR, Estimated: 3 mL/min — ABNORMAL LOW (ref >60)
Glucose, Bld: 305 mg/dL — ABNORMAL HIGH (ref 70–99)
Potassium: 4.1 mmol/L (ref 3.5–5.1)
Sodium: 129 mmol/L — ABNORMAL LOW (ref 135–145)
Total Bilirubin: 0.9 mg/dL (ref 0.3–1.2)
Total Protein: 4 g/dL — ABNORMAL LOW (ref 6.5–8.1)

## 2021-04-18 LAB — CBC
HCT: 39.2 % (ref 36.0–46.0)
Hemoglobin: 12.9 g/dL (ref 12.0–15.0)
MCH: 27.8 pg (ref 26.0–34.0)
MCHC: 32.9 g/dL (ref 30.0–36.0)
MCV: 84.5 fL (ref 80.0–100.0)
Platelets: 287 K/uL (ref 150–400)
RBC: 4.64 MIL/uL (ref 3.87–5.11)
RDW: 13.5 % (ref 11.5–15.5)
WBC: 11.4 K/uL — ABNORMAL HIGH (ref 4.0–10.5)
nRBC: 0.2 % (ref 0.0–0.2)

## 2021-04-18 LAB — GLUCOSE, CAPILLARY
Glucose-Capillary: 332 mg/dL — ABNORMAL HIGH (ref 70–99)
Glucose-Capillary: 198 mg/dL — ABNORMAL HIGH (ref 70–99)
Glucose-Capillary: 169 mg/dL — ABNORMAL HIGH (ref 70–99)
Glucose-Capillary: 257 mg/dL — ABNORMAL HIGH (ref 70–99)
Glucose-Capillary: 291 mg/dL — ABNORMAL HIGH (ref 70–99)

## 2021-04-18 LAB — PHOSPHORUS: Phosphorus: 9.5 mg/dL — ABNORMAL HIGH (ref 2.5–4.6)

## 2021-04-18 LAB — CK: Total CK: 378 U/L — ABNORMAL HIGH (ref 38–234)

## 2021-04-18 SURGERY — RIGHT/LEFT HEART CATH AND CORONARY ANGIOGRAPHY
Anesthesia: LOCAL

## 2021-04-18 MED ORDER — MIDAZOLAM HCL 2 MG/2ML IJ SOLN
INTRAMUSCULAR | Status: DC | PRN
  Administered 2021-04-18: .5 mg via INTRAVENOUS

## 2021-04-18 MED ORDER — IOHEXOL 350 MG/ML SOLN
INTRAVENOUS | Status: DC | PRN
Start: 2021-04-18 — End: 2021-04-18
  Administered 2021-04-18: 50 mL

## 2021-04-18 MED ORDER — ACETAMINOPHEN 325 MG PO TABS: 650.0000 mg | ORAL_TABLET | ORAL | Status: DC | PRN

## 2021-04-18 MED ORDER — LIDOCAINE HCL (PF) 1 % IJ SOLN
INTRAMUSCULAR | Status: DC | PRN
  Administered 2021-04-18: 15 mL

## 2021-04-18 MED ORDER — LABETALOL HCL 5 MG/ML IV SOLN: 10.0000 mg | INTRAVENOUS | Status: AC | PRN

## 2021-04-18 MED ORDER — LORATADINE 10 MG PO TABS
10.0000 mg | ORAL_TABLET | Freq: Every day | ORAL | Status: DC
  Administered 2021-04-18 – 2021-04-25 (×7): 10 mg via ORAL
  Filled 2021-04-18 (×8): qty 1

## 2021-04-18 MED ORDER — SODIUM CHLORIDE 0.9% FLUSH
3.0000 mL | Freq: Two times a day (BID) | INTRAVENOUS | Status: DC
  Administered 2021-04-18 – 2021-04-20 (×4): 3 mL via INTRAVENOUS

## 2021-04-18 MED ORDER — HEPARIN SODIUM (PORCINE) 5000 UNIT/ML IJ SOLN
5000.0000 [IU] | Freq: Three times a day (TID) | INTRAMUSCULAR | Status: DC
  Administered 2021-04-18 – 2021-04-28 (×29): 5000 [IU] via SUBCUTANEOUS
  Filled 2021-04-18 (×28): qty 1

## 2021-04-18 MED ORDER — FENTANYL CITRATE (PF) 100 MCG/2ML IJ SOLN
INTRAMUSCULAR | Status: AC
Start: 2021-04-18 — End: ?
  Filled 2021-04-18: qty 2

## 2021-04-18 MED ORDER — AMIODARONE HCL IN DEXTROSE 360-4.14 MG/200ML-% IV SOLN
INTRAVENOUS | Status: AC
  Filled 2021-04-18: qty 200

## 2021-04-18 MED ORDER — DELFLEX-LC/2.5% DEXTROSE 394 MOSM/L IP SOLN: INTRAPERITONEAL | Status: DC

## 2021-04-18 MED ORDER — HEPARIN (PORCINE) IN NACL 1000-0.9 UT/500ML-% IV SOLN
INTRAVENOUS | Status: AC
  Filled 2021-04-18: qty 1000

## 2021-04-18 MED ORDER — ONDANSETRON HCL 4 MG/2ML IJ SOLN: 4.0000 mg | Freq: Four times a day (QID) | INTRAMUSCULAR | Status: DC | PRN

## 2021-04-18 MED ORDER — SODIUM CHLORIDE 0.9 % IV SOLN: 250.0000 mL | INTRAVENOUS | Status: DC | PRN

## 2021-04-18 MED ORDER — LIDOCAINE HCL (PF) 1 % IJ SOLN
INTRAMUSCULAR | Status: AC
  Filled 2021-04-18: qty 30

## 2021-04-18 MED ORDER — HEPARIN (PORCINE) IN NACL 1000-0.9 UT/500ML-% IV SOLN
INTRAVENOUS | Status: DC | PRN
  Administered 2021-04-18 (×2): 500 mL

## 2021-04-18 MED ORDER — INSULIN GLARGINE-YFGN 100 UNIT/ML ~~LOC~~ SOLN
10.0000 [IU] | Freq: Every day | SUBCUTANEOUS | Status: DC
  Filled 2021-04-18: qty 0.1

## 2021-04-18 MED ORDER — SODIUM CHLORIDE 0.9% FLUSH: 3.0000 mL | INTRAVENOUS | Status: DC | PRN

## 2021-04-18 MED ORDER — ASPIRIN 81 MG PO CHEW
81.0000 mg | CHEWABLE_TABLET | Freq: Every day | ORAL | Status: DC
  Administered 2021-04-19 – 2021-04-25 (×7): 81 mg via ORAL
  Filled 2021-04-18 (×7): qty 1

## 2021-04-18 MED ORDER — MORPHINE SULFATE (PF) 2 MG/ML IV SOLN
2.0000 mg | INTRAVENOUS | Status: DC | PRN
  Administered 2021-04-18: 4 mg via INTRAVENOUS
  Filled 2021-04-18: qty 2

## 2021-04-18 MED ORDER — SODIUM CHLORIDE 0.9% FLUSH
3.0000 mL | Freq: Two times a day (BID) | INTRAVENOUS | Status: DC
  Administered 2021-04-18: 3 mL via INTRAVENOUS

## 2021-04-18 MED ORDER — FENTANYL CITRATE (PF) 100 MCG/2ML IJ SOLN
INTRAMUSCULAR | Status: DC | PRN
  Administered 2021-04-18: 25 ug via INTRAVENOUS

## 2021-04-18 MED ORDER — SODIUM CHLORIDE 0.9 % IV SOLN: INTRAVENOUS | Status: DC

## 2021-04-18 MED ORDER — MIDAZOLAM HCL 2 MG/2ML IJ SOLN
INTRAMUSCULAR | Status: AC
  Filled 2021-04-18: qty 2

## 2021-04-18 MED ORDER — ASPIRIN 81 MG PO CHEW: 81.0000 mg | CHEWABLE_TABLET | ORAL | Status: DC

## 2021-04-18 MED ORDER — HYDRALAZINE HCL 20 MG/ML IJ SOLN: 10.0000 mg | INTRAMUSCULAR | Status: AC | PRN

## 2021-04-18 SURGICAL SUPPLY — 13 items
CATH INFINITI 5FR MULTPACK ANG (CATHETERS) ×1 IMPLANT
CATH SWAN GANZ 7F STRAIGHT (CATHETERS) ×1 IMPLANT
GLIDESHEATH SLEND A-KIT 6F 22G (SHEATH) IMPLANT
GUIDEWIRE INQWIRE 1.5J.035X260 (WIRE) IMPLANT
INQWIRE 1.5J .035X260CM (WIRE) ×2
KIT HEART LEFT (KITS) ×3 IMPLANT
PACK CARDIAC CATHETERIZATION (CUSTOM PROCEDURE TRAY) ×3 IMPLANT
SHEATH PINNACLE 5F 10CM (SHEATH) ×1 IMPLANT
SHEATH PINNACLE 6F 10CM (SHEATH) IMPLANT
SHEATH PINNACLE 7F 10CM (SHEATH) ×1 IMPLANT
SHEATH PROBE COVER 6X72 (BAG) ×1 IMPLANT
TRANSDUCER W/STOPCOCK (MISCELLANEOUS) ×3 IMPLANT
TUBING CIL FLEX 10 FLL-RA (TUBING) ×3 IMPLANT

## 2021-04-18 NOTE — Progress Notes (Signed)
?[facility] ?Progress Note  ? ?Subjective:    ?She finally made it to a room-  completed full PD overnight-  says she feels fine-  has not had more vomiting-  thinks it is the hospital food that makes her sick-  currently NPO for possible cardiac cath-  labs were done in the midst of her PD so BUN was still high -  low calcium but corrected is OK  ? ?Objective ?Vitals:  ? 04/17/21 2300 04/21/2021 0300 04/11/2021 0400 04/17/2021 0426  ?BP: 130/77 106/89 115/82   ?Pulse: 93 86 95   ?Resp: 18 17 20    ?Temp: 98.6 ?F (37 ?C)  99 ?F (37.2 ?C)   ?TempSrc: Oral  Oral   ?SpO2: 95% 94% 95%   ?Weight:    69.1 kg  ?Height:      ? ?Physical Exam ?General: alert female in NAD ?Heart: RRR, no murmurs, rubs or gallops ?Lungs: CTA bilaterally without wheezing, rhonchi or rales ?Abdomen: Soft, non-tender, non-distended, +BS ?Extremities: trace- 1+ edema left lower extremity ?Dialysis Access: PD cath in lower abdomen without surrounding tenderness, redness or drainage ? ?Additional Objective ?Labs: ?Basic Metabolic Panel: ?Recent Labs  ?Lab 04/19/2021 ?1202 05/02/2021 ?1234 04/29/2021 ?2002 04/17/21 ?0352 04/20/2021 ?0028  ?NA 132* 128* 126* 130* 129*  ?K 4.7 4.5 4.0 4.3 4.1  ?CL 93* 94*  --  95* 95*  ?CO2 13*  --   --  16* 15*  ?GLUCOSE 97 95  --  228* 305*  ?BUN 114* >130*  --  120* 115*  ?CREATININE 16.03* 16.40*  --  16.37* 15.04*  ?CALCIUM 6.5*  --   --  5.7* 6.3*  ?PHOS 10.8*  --   --   --  9.5*  ? ?Liver Function Tests: ?Recent Labs  ?Lab 04/12/2021 ?1202 04/14/2021 ?0028  ?AST 27 16  ?ALT 15 17  ?ALKPHOS 171* 192*  ?BILITOT 0.8 0.9  ?PROT 5.3* 4.0*  ?ALBUMIN 2.0* <1.5*  ? ? ?CBC: ?Recent Labs  ?Lab 04/14/2021 ?1209 04/10/2021 ?1234 04/06/2021 ?2002 04/17/21 ?0352 04/19/2021 ?0028  ?WBC 10.0  --   --  11.0* 11.4*  ?NEUTROABS 8.2*  --   --   --   --   ?HGB 15.8*   < > 13.6 12.6 12.9  ?HCT 46.5*   < > 40.0 37.2 39.2  ?MCV 85.2  --   --  84.5 84.5  ?PLT 276  --   --  251 287  ? < > = values in this interval not displayed.  ? ?Blood  Culture ?   ?Component Value Date/Time  ? SDES BLOOD SITE NOT SPECIFIED 04/30/2021 1202  ? SPECREQUEST  05/05/2021 1202  ?  BOTTLES DRAWN AEROBIC AND ANAEROBIC Blood Culture results may not be optimal due to an inadequate volume of blood received in culture bottles  ? CULT  04/30/2021 1202  ?  NO GROWTH < 24 HOURS ?Performed at [facility], [place] [address], [place], [place] [address] ?  ? REPTSTATUS PENDING 04/12/2021 1202  ? ? ?Cardiac Enzymes: ?Recent Labs  ?Lab 04/22/2021 ?1202 04/17/21 ?0352 04/09/2021 ?0028  ?CKTOTAL 1,209* 526* 378*  ? ?CBG: ?Recent Labs  ?Lab 04/17/21 ?0822 04/17/21 ?1418 04/17/21 ?1901 04/17/21 ?2119 05/05/2021 ?2683  ?GLUCAP 163* 202* 237* 281* 332*  ? ?Iron Studies: No results for input(s): IRON, TIBC, TRANSFERRIN, FERRITIN in the last 72 hours. ?@lablastinr3 @ ?Studies/Results: ?CT Head Wo Contrast ? ?Result Date: 04/13/2021 ?CLINICAL DATA:  Acute neuro deficit rule out stroke EXAM:  CT HEAD WITHOUT CONTRAST TECHNIQUE: Contiguous axial images were obtained from the base of the skull through the vertex without intravenous contrast. RADIATION DOSE REDUCTION: This exam was performed according to the departmental dose-optimization program which includes automated exposure control, adjustment of the mA and/or kV according to patient size and/or use of iterative reconstruction technique. COMPARISON:  None. FINDINGS: Brain: Mild atrophy without hydrocephalus. Patchy white matter hypodensity throughout both cerebral hemispheres. Negative for acute infarct, hemorrhage, mass Vascular: Negative for hyperdense vessel. Atherosclerotic calcification in the carotid and vertebral arteries bilaterally. Skull: Negative Sinuses/Orbits: Mild mucosal edema paranasal sinuses. Bilateral cataract extraction Other: None IMPRESSION: No acute abnormality Mild atrophy and moderate chronic microvascular ischemic change Electronically Signed   By: Franchot Gallo M.D.   On: 04/17/2021 13:21  ? ?DG Chest Portable 1  View ? ?Result Date: 04/19/2021 ?CLINICAL DATA:  Weakness, peritoneal dialysis patient EXAM: PORTABLE CHEST 1 VIEW COMPARISON:  03/28/2021 FINDINGS: Similar low lung volumes with cardiac enlargement and central vascular congestion. Basilar atelectasis suspected. No large effusion or definite CHF pattern. No pneumothorax. Trachea midline. Aorta atherosclerotic. Degenerative changes noted of the spine. IMPRESSION: Similar low lung volumes with cardiomegaly and vascular congestion. Basilar atelectasis. Aortic Atherosclerosis (ICD10-I70.0). Electronically Signed   By: Jerilynn Mages.  Shick M.D.   On: 04/26/2021 12:12  ? ?ECHOCARDIOGRAM COMPLETE ? ?Result Date: 04/22/2021 ?   ECHOCARDIOGRAM REPORT   Patient Name:   Ann Bryan Date of Exam: 04/14/2021 Medical Rec #:  [id number]    Height:       60.0 in Accession #:    [id number]   Weight:       142.0 lb Date of Birth:  1960-08-20   BSA:          1.614 m? Patient Age:    61 years     BP:           111/80 mmHg Patient Gender: F            HR:           64 bpm. Exam Location:  Inpatient Procedure: 2D Echo, 3D Echo, Cardiac Doppler, Color Doppler and Intracardiac            Opacification Agent Indications:    NSTEMI I21.4  History:        Patient has no prior history of Echocardiogram examinations.                 Signs/Symptoms:Dyspnea; Risk Factors:Diabetes, Dyslipidemia and                 Hypertension.  Sonographer:    Bernadene Person RDCS Referring Phys: [id number] [place]  1. Left ventricular ejection fraction by 3D volume is 21 %. The left ventricle has severely decreased function. The left ventricle demonstrates global hypokinesis. Left ventricular diastolic parameters are consistent with Grade I diastolic dysfunction (impaired relaxation). Elevated left ventricular end-diastolic pressure.  2. Right ventricular systolic function is moderately reduced. The right ventricular size is mildly enlarged. There is normal pulmonary artery systolic pressure.  3. Left atrial size  was mildly dilated.  4. There is small to moderate circumferential pericardial effusion. There is no evidence of cardiac tamponade.  5. The mitral valve is normal in structure. Mild mitral valve regurgitation. No evidence of mitral stenosis.  6. The aortic valve is normal in structure. Aortic valve regurgitation is not visualized. No aortic stenosis is present.  7. The inferior vena cava is normal in size with greater than 50%  respiratory variability, suggesting right atrial pressure of 3 mmHg. Comparison(s): No prior Echocardiogram. FINDINGS  Left Ventricle: Left ventricular ejection fraction by 3D volume is 21 %. The left ventricle has severely decreased function. The left ventricle demonstrates global hypokinesis. Definity contrast agent was given IV to delineate the left ventricular endocardial borders. The left ventricular internal cavity size was normal in size. There is no left ventricular hypertrophy. Left ventricular diastolic parameters are consistent with Grade I diastolic dysfunction (impaired relaxation). Elevated left ventricular end-diastolic pressure. Right Ventricle: The right ventricular size is mildly enlarged. No increase in right ventricular wall thickness. Right ventricular systolic function is moderately reduced. There is normal pulmonary artery systolic pressure. The tricuspid regurgitant velocity is 1.35 m/s, and with an assumed right atrial pressure of 3 mmHg, the estimated right ventricular systolic pressure is 40.7 mmHg. Left Atrium: Left atrial size was mildly dilated. Right Atrium: Right atrial size was normal in size. Pericardium: There is small to moderate circumferential pericardial effusion. There is no evidence of cardiac tamponade. Mitral Valve: The mitral valve is normal in structure. Mild mitral valve regurgitation. No evidence of mitral valve stenosis. Tricuspid Valve: The tricuspid valve is normal in structure. Tricuspid valve regurgitation is mild . No evidence of tricuspid  stenosis. Aortic Valve: The aortic valve is normal in structure. Aortic valve regurgitation is not visualized. No aortic stenosis is present. Pulmonic Valve: The pulmonic valve was normal in structure. Pulmonic val

## 2021-04-18 NOTE — Progress Notes (Signed)
Site area: rt groin arterial and venous sheaths pulled and pressure held by York Cerise, RN ?Site Prior to Removal:  Level 0 ?Pressure Applied For: 20 minutes ?Manual:   yes ?Patient Status During Pull:  stable ?Post Pull Site:  Level 0 ?Post Pull Instructions Given:  yes ?Post Pull Pulses Present: rt anterior tibial pulse dopplered ?Dressing Applied:  gauze and tegaderm ?Bedrest begins @ 1300 ?Comments: ?  ?

## 2021-04-18 NOTE — H&P (View-Only) (Signed)
? ?Progress Note ? ?Patient Name: Ann Bryan ?Date of Encounter: 05/01/2021 ? ?[facility] Cardiologist: New ? ?Subjective  ? ?Pt denies CP    No SOB   Some nausea ? ?Inpatient Medications  ?  ?Scheduled Meds: ? aspirin EC  81 mg Oral Daily  ? brimonidine  1 drop Left Eye BID  ? calcitRIOL  0.5 mcg Oral Daily  ? calcium acetate  2,001 mg Oral TID WC  ? gabapentin  100 mg Oral Daily  ? gentamicin cream  1 application. Topical Daily  ? heparin  5,000 Units Subcutaneous Q8H  ? insulin aspart  0-6 Units Subcutaneous TID WC  ? ?Continuous Infusions: ? amiodarone    ? dialysis solution 2.5% low-MG/low-CA    ? ?PRN Meds: ?acetaminophen, calcium acetate, hydrALAZINE, nitroGLYCERIN, ondansetron (ZOFRAN) IV, prochlorperazine  ? ?Vital Signs  ?  ?Vitals:  ? 04/10/2021 0300 05/03/2021 0400 04/14/2021 0426 04/09/2021 0846  ?BP: 106/89 115/82  118/88  ?Pulse: 86 95  75  ?Resp: 17 20  17   ?Temp:  99 ?F (37.2 ?C)    ?TempSrc:  Oral    ?SpO2: 94% 95%  96%  ?Weight:   69.1 kg   ?Height:      ? ? ?Intake/Output Summary (Last 24 hours) at 05/03/2021 0908 ?Last data filed at 04/20/2021 1610 ?Gross per 24 hour  ?Intake 11141 ml  ?Output 2604 ml  ?Net 8537 ml  ? ? ?  04/07/2021  ?  4:26 AM 04/17/2021  ?  5:00 PM 04/17/2021  ? 11:31 AM  ?Last 3 Weights  ?Weight (lbs) 152 lb 5.4 oz 144 lb 13.5 oz 145 lb  ?Weight (kg) 69.1 kg 65.7 kg 65.772 kg  ?   ? ?Telemetry  ?  ?SR - Personally Reviewed ? ?ECG  ?  ?No new  - Personally Reviewed ? ?Physical Exam  ? ?GEN: No acute distress.   ?Neck: No JVD ?Cardiac: RRR, no murmurs  ?Respiratory: Clear to auscultation bilaterally. ?GI: Soft, nontender, non-distended  ?MS: No edema; No deformity. ?Neuro:  Nonfocal  ?Psych: Normal affect  ? ?Labs  ?  ?High Sensitivity Troponin:   ?Recent Labs  ?Lab 04/09/2021 ?1202 05/02/2021 ?2003 04/17/21 ?0352 04/17/21 ?0500  ?TROPONINIHS 2,296* 1,704* 2,531* 2,204*  ?   ?Chemistry ?Recent Labs  ?Lab 04/06/2021 ?1202 04/15/2021 ?1234 04/15/2021 ?2002 04/17/21 ?0352 04/10/2021 ?0028  ?NA 132*  128* 126* 130* 129*  ?K 4.7 4.5 4.0 4.3 4.1  ?CL 93* 94*  --  95* 95*  ?CO2 13*  --   --  16* 15*  ?GLUCOSE 97 95  --  228* 305*  ?BUN 114* >130*  --  120* 115*  ?CREATININE 16.03* 16.40*  --  16.37* 15.04*  ?CALCIUM 6.5*  --   --  5.7* 6.3*  ?MG 1.9  --   --   --   --   ?PROT 5.3*  --   --   --  4.0*  ?ALBUMIN 2.0*  --   --   --  <1.5*  ?AST 27  --   --   --  16  ?ALT 15  --   --   --  17  ?ALKPHOS 171*  --   --   --  192*  ?BILITOT 0.8  --   --   --  0.9  ?GFRNONAA 2*  --   --  2* 3*  ?ANIONGAP 26*  --   --  19* 19*  ?  ?Lipids No results for  input(s): CHOL, TRIG, HDL, LABVLDL, LDLCALC, CHOLHDL in the last 168 hours.  ?Hematology ?Recent Labs  ?Lab 04/13/2021 ?1209 04/18/2021 ?1234 05/05/2021 ?2002 04/17/21 ?0352 04/29/2021 ?0028  ?WBC 10.0  --   --  11.0* 11.4*  ?RBC 5.46*  --   --  4.40 4.64  ?HGB 15.8*   < > 13.6 12.6 12.9  ?HCT 46.5*   < > 40.0 37.2 39.2  ?MCV 85.2  --   --  84.5 84.5  ?MCH 28.9  --   --  28.6 27.8  ?MCHC 34.0  --   --  33.9 32.9  ?RDW 13.3  --   --  13.5 13.5  ?PLT 276  --   --  251 287  ? < > = values in this interval not displayed.  ? ?Thyroid  ?Recent Labs  ?Lab 04/25/2021 ?1202  ?TSH 0.724  ?  ?BNPNo results for input(s): BNP, PROBNP in the last 168 hours.  ?DDimer No results for input(s): DDIMER in the last 168 hours.  ? ?Radiology  ?  ?CT Head Wo Contrast ? ?Result Date: 04/12/2021 ?CLINICAL DATA:  Acute neuro deficit rule out stroke EXAM: CT HEAD WITHOUT CONTRAST TECHNIQUE: Contiguous axial images were obtained from the base of the skull through the vertex without intravenous contrast. RADIATION DOSE REDUCTION: This exam was performed according to the departmental dose-optimization program which includes automated exposure control, adjustment of the mA and/or kV according to patient size and/or use of iterative reconstruction technique. COMPARISON:  None. FINDINGS: Brain: Mild atrophy without hydrocephalus. Patchy white matter hypodensity throughout both cerebral hemispheres. Negative for acute  infarct, hemorrhage, mass Vascular: Negative for hyperdense vessel. Atherosclerotic calcification in the carotid and vertebral arteries bilaterally. Skull: Negative Sinuses/Orbits: Mild mucosal edema paranasal sinuses. Bilateral cataract extraction Other: None IMPRESSION: No acute abnormality Mild atrophy and moderate chronic microvascular ischemic change Electronically Signed   By: Franchot Gallo M.D.   On: 04/13/2021 13:21  ? ?DG Chest Portable 1 View ? ?Result Date: 04/06/2021 ?CLINICAL DATA:  Weakness, peritoneal dialysis patient EXAM: PORTABLE CHEST 1 VIEW COMPARISON:  03/28/2021 FINDINGS: Similar low lung volumes with cardiac enlargement and central vascular congestion. Basilar atelectasis suspected. No large effusion or definite CHF pattern. No pneumothorax. Trachea midline. Aorta atherosclerotic. Degenerative changes noted of the spine. IMPRESSION: Similar low lung volumes with cardiomegaly and vascular congestion. Basilar atelectasis. Aortic Atherosclerosis (ICD10-I70.0). Electronically Signed   By: Jerilynn Mages.  Shick M.D.   On: 05/03/2021 12:12  ? ?ECHOCARDIOGRAM COMPLETE ? ?Result Date: 04/25/2021 ?   ECHOCARDIOGRAM REPORT   Patient Name:   Ann Bryan Date of Exam: 04/10/2021 Medical Rec #:  [id number]    Height:       60.0 in Accession #:    [id number]   Weight:       142.0 lb Date of Birth:  [birth year]   BSA:          1.614 m? Patient Age:    61 years     BP:           111/80 mmHg Patient Gender: F            HR:           64 bpm. Exam Location:  Inpatient Procedure: 2D Echo, 3D Echo, Cardiac Doppler, Color Doppler and Intracardiac            Opacification Agent Indications:    NSTEMI I21.4  History:        Patient has no prior history of  Echocardiogram examinations.                 Signs/Symptoms:Dyspnea; Risk Factors:Diabetes, Dyslipidemia and                 Hypertension.  Sonographer:    Bernadene Person RDCS Referring Phys: [id number] [place]  1. Left ventricular ejection fraction by 3D volume is  21 %. The left ventricle has severely decreased function. The left ventricle demonstrates global hypokinesis. Left ventricular diastolic parameters are consistent with Grade I diastolic dysfunction (impaired relaxation). Elevated left ventricular end-diastolic pressure.  2. Right ventricular systolic function is moderately reduced. The right ventricular size is mildly enlarged. There is normal pulmonary artery systolic pressure.  3. Left atrial size was mildly dilated.  4. There is small to moderate circumferential pericardial effusion. There is no evidence of cardiac tamponade.  5. The mitral valve is normal in structure. Mild mitral valve regurgitation. No evidence of mitral stenosis.  6. The aortic valve is normal in structure. Aortic valve regurgitation is not visualized. No aortic stenosis is present.  7. The inferior vena cava is normal in size with greater than 50% respiratory variability, suggesting right atrial pressure of 3 mmHg. Comparison(s): No prior Echocardiogram. FINDINGS  Left Ventricle: Left ventricular ejection fraction by 3D volume is 21 %. The left ventricle has severely decreased function. The left ventricle demonstrates global hypokinesis. Definity contrast agent was given IV to delineate the left ventricular endocardial borders. The left ventricular internal cavity size was normal in size. There is no left ventricular hypertrophy. Left ventricular diastolic parameters are consistent with Grade I diastolic dysfunction (impaired relaxation). Elevated left ventricular end-diastolic pressure. Right Ventricle: The right ventricular size is mildly enlarged. No increase in right ventricular wall thickness. Right ventricular systolic function is moderately reduced. There is normal pulmonary artery systolic pressure. The tricuspid regurgitant velocity is 1.35 m/s, and with an assumed right atrial pressure of 3 mmHg, the estimated right ventricular systolic pressure is 21.3 mmHg. Left Atrium: Left  atrial size was mildly dilated. Right Atrium: Right atrial size was normal in size. Pericardium: There is small to moderate circumferential pericardial effusion. There is no evidence of cardiac tamponade.

## 2021-04-18 NOTE — Evaluation (Addendum)
Occupational Therapy Evaluation Patient Details Name: Ann Bryan MRN: [id number] DOB: 03-14-1960 Today's Date: 04/18/2021   History of Present Illness 61 y.o. female presenting to ED on 04/16/21 with generalized weakness (L>R) and pre-syncopal episode. CT of head negative. Pt admitted with NSTEMI with elevated troponin and echo displaying severe LV and RV dysfunction. Pt also with questionable hypoglycemic episode and sepsis. PMH: DM2, HTN, HLD, diabetic neuropathy, and ESRD on PD   Clinical Impression   PTA, pt was living alone and was independent. Currently, pt requires Min A for ADLs and functional mobility using RW. Pt presenting with decreased strength, balance, and activity tolerance. Pt performing sponge bath at sink with Min A for posterior lean and then seated rest breaks. HR elevating to 120-130s and then quickly returning to 90s. Denies any dizziness throughout session. Decreased processing and memory with repeating certain topics throughout session. Pt would benefit from further acute OT to facilitate safe dc. As pt with decreased functional performance, home support, and safety, recommend dc to SNF for further OT to optimize safety, independence with ADLs, and return to PLOF.     Recommendations for follow up therapy are one component of a multi-disciplinary discharge planning process, led by the attending physician.  Recommendations may be updated based on patient status, additional functional criteria and insurance authorization.   Follow Up Recommendations  Skilled nursing-short term rehab (<3 hours/day)    Assistance Recommended at Discharge Frequent or constant Supervision/Assistance  Patient can return home with the following A little help with walking and/or transfers;A little help with bathing/dressing/bathroom;Assistance with cooking/housework;Assist for transportation    Functional Status Assessment  Patient has had a recent decline in their functional status and  demonstrates the ability to make significant improvements in function in a reasonable and predictable amount of time.  Equipment Recommendations  Other (comment) (Defer to next venue)    Recommendations for Other Services PT consult     Precautions / Restrictions Precautions Precautions: Fall Precaution Comments: watch HR Restrictions Weight Bearing Restrictions: No      Mobility Bed Mobility Overal bed mobility: Needs Assistance Bed Mobility: Supine to Sit, Sit to Supine     Supine to sit: Min guard Sit to supine: Min guard   General bed mobility comments: Min Guard A for safety. increased time    Transfers Overall transfer level: Needs assistance Equipment used: Rolling walker (2 wheels) Transfers: Sit to/from Stand Sit to Stand: Min assist, Min guard           General transfer comment: Sit<>stand from EOB with Min A for power up and weight shift forward. Min Guard A from chair with arm rests.      Balance Overall balance assessment: Needs assistance Sitting-balance support: No upper extremity supported, Feet supported Sitting balance-Leahy Scale: Fair     Standing balance support: Bilateral upper extremity supported, During functional activity, Reliant on assistive device for balance Standing balance-Leahy Scale: Poor Standing balance comment: Reliant on RW and up to minA                           ADL either performed or assessed with clinical judgement   ADL Overall ADL's : Needs assistance/impaired Eating/Feeding: Set up;Sitting   Grooming: Wash/dry face;Minimal assistance;Standing Grooming Details (indicate cue type and reason): Min A for standing balance Upper Body Bathing: Min guard;Minimal assistance;Standing   Lower Body Bathing: Minimal assistance;Sit to/from stand Lower Body Bathing Details (indicate cue type and reason): Min  A for standing balance with posterior lean Upper Body Dressing : Minimal assistance;Sitting Upper Body  Dressing Details (indicate cue type and reason): donning gown Lower Body Dressing: Minimal assistance;Sit to/from stand   Toilet Transfer: Minimal assistance;Ambulation;Rolling walker (2 wheels) (simulated to recliner)           Functional mobility during ADLs: Minimal assistance;Rolling walker (2 wheels) General ADL Comments: Pt performing sponge bath at sink with Min A for     Vision Baseline Vision/History: 1 Wears glasses       Perception     Praxis      Pertinent Vitals/Pain Pain Assessment Pain Assessment: Faces Faces Pain Scale: Hurts a little bit Pain Location: BLEs Pain Descriptors / Indicators: Discomfort, Sore, Tender Pain Intervention(s): Monitored during session, Limited activity within patient's tolerance, Repositioned     Hand Dominance Right   Extremity/Trunk Assessment Upper Extremity Assessment Upper Extremity Assessment: Generalized weakness   Lower Extremity Assessment Lower Extremity Assessment: Defer to PT evaluation RLE Deficits / Details: MMT scores of 4 hip flexion, 5 knee extension, 4+ ankle dorsiflexion; hx of peripheral neuropathy, denies other numbness/tingling RLE Sensation: history of peripheral neuropathy LLE Deficits / Details: MMT scores of 4- hip flexion, 4+ knee extension, 4 ankle dorsiflexion; hx of peripheral neuropathy, denies other numbness/tingling LLE Sensation: history of peripheral neuropathy   Cervical / Trunk Assessment Cervical / Trunk Assessment: Kyphotic   Communication Communication Communication: No difficulties   Cognition Arousal/Alertness: Awake/alert Behavior During Therapy: WFL for tasks assessed/performed Overall Cognitive Status: Impaired/Different from baseline Area of Impairment: Problem solving, Following commands, Memory, Awareness                     Memory: Decreased short-term memory Following Commands: Follows multi-step commands inconsistently   Awareness: Emergent Problem Solving:  Slow processing, Requires verbal cues General Comments: Motivated to take bath and very agreeable to therapy. Appreciative throughout. Perseverating on topics of 1) having procedure and 2) recieving medication - repeating these topics despite cues and education. Slow processing and requiring increased time and cues. Slightly lethargic and sleepy. Requiring cues for recalling what the therapy plan was (even though she helped make plan).     General Comments  HR elevating to 120-140s. SpO2 90s on RA. RR 22.    Exercises     Shoulder Instructions      Home Living Family/patient expects to be discharged to:: Private residence Living Arrangements: Alone Available Help at Discharge: Family;Available PRN/intermittently (niece works and lives in [place]) Type of Home: Apartment Home Access: Stairs to enter Secretary/administrator of Steps: 3 + 3 flights Entrance Stairs-Rails: None;Right (none on 3 initial steps, R rail ascending on 3 flights to access apartment) Home Layout: One level     Bathroom Shower/Tub: Chief Strategy Officer: Standard Bathroom Accessibility: No   Home Equipment: None          Prior Functioning/Environment Prior Level of Function : Independent/Modified Independent             Mobility Comments: Holds onto furniture for support. Denies any falls in past 6 months, but did slide onto ground recently when got nauseated. Has difficulty navigating stairs, she has to sit after each set to rest. Sometimes she crawls up/down stairs. ADLs Comments: Does not drive, takes Lyft or public transportation. Has had difficulty stepping over tub for showers. Sometimes rides electric carts to grocery shop.        OT Problem List: Decreased strength;Decreased range of motion;Decreased  activity tolerance;Impaired balance (sitting and/or standing);Decreased knowledge of use of DME or AE;Decreased knowledge of precautions      OT Treatment/Interventions:  Therapeutic exercise;Self-care/ADL training;Energy conservation;DME and/or AE instruction;Therapeutic activities;Patient/family education    OT Goals(Current goals can be found in the care plan section) Acute Rehab OT Goals Patient Stated Goal: Get stronger OT Goal Formulation: With patient Time For Goal Achievement: 05/02/21 Potential to Achieve Goals: Good  OT Frequency: Min 2X/week    Co-evaluation              AM-PAC OT "6 Clicks" Daily Activity     Outcome Measure Help from another person eating meals?: A Little Help from another person taking care of personal grooming?: A Little Help from another person toileting, which includes using toliet, bedpan, or urinal?: A Little Help from another person bathing (including washing, rinsing, drying)?: A Little Help from another person to put on and taking off regular upper body clothing?: A Little Help from another person to put on and taking off regular lower body clothing?: A Little 6 Click Score: 18   End of Session Equipment Utilized During Treatment: Rolling walker (2 wheels) Nurse Communication: Mobility status  Activity Tolerance: Patient tolerated treatment well Patient left: in bed;with call bell/phone within reach;with bed alarm set  OT Visit Diagnosis: Other abnormalities of gait and mobility (R26.89);Unsteadiness on feet (R26.81);Muscle weakness (generalized) (M62.81)                Time: 2952-8413 OT Time Calculation (min): 33 min Charges:  OT General Charges $OT Visit: 1 Visit OT Evaluation $OT Eval Moderate Complexity: 1 Mod OT Treatments $Self Care/Home Management : 8-22 mins  Derrius Furtick MSOT, OTR/L Acute Rehab Pager: [phone] Office: [phone]  Theodoro Grist Raydan Schlabach 04/18/2021, 9:47 AM

## 2021-04-18 NOTE — Plan of Care (Signed)

## 2021-04-18 NOTE — Progress Notes (Signed)
? Ann Bryan  KDT:[id number] DOB: [birth year] DOA: 04/06/2021 ?PCP: Ladell Pier, MD   ? ?Brief Narrative:  ?61 year old with a history of DM2, HTN, HLD, diabetic neuropathy, and ESRD on PD who presented to the ED with severe generalized weakness for approximately 6 to 7 days and an eventual pre-syncopal spell resulting in a fall without loss of consciousness.  After her fall she was too weak to get up and spent the night on the floor, thusly missing her PD.  A neighbor called EMS who arrived to find the patient on the floor with stable vital signs and a normal CBG.  In the ED the patient was hypothermic with temperature 95.4.  WBC was borderline at 10. ? ?Consultants:  ?Cardiology - CHF Team  ?Nephrology ? ?Code Status: FULL CODE ? ?DVT prophylaxis: ?Subcutaneous heparin ? ?Interim Hx: ?Received PD last night.  Today's labs were drawn during the course of her treatment and are therefore not reflective of her full treatment.  Resting quietly at time of visit postcardiac cath.  Respirations are unlabored.  There is no evidence of uncontrolled pain. ? ?Assessment & Plan: ? ?Near syncope / fall / generalized weakness ?Likely due to newly diagnoses severe systolic CHF (see below) - uremia likely a significant contributor as well - tx each issue and follow  ? ?Newly diagnosed severe systolic CHF ?TTE noted EF 21% w/ severely decreased LV systolic fxn -cardiac cath today suggest combination of ischemic and nonischemic etiologies - volume control per PD -advanced heart failure team to follow ? ?3 vessel calcific CAD - NSTEMI ?Cardiology following -BB on hold due to low blood pressure and mild bradycardia -initial troponin at presentation quite elevated at 2296 -f cardiac cath has revealed signif 3 vessel disease that is not amenable to intervention  ? ?Possible sepsis - ruled out clinically  ?This diagnosis was based on hypothermia at presentation as well as elevated lactate -these findings could also be easily  explained based simply on the fact that she slept on the floor all night prior to her presentation  ? ?Mild rhabdomyolysis ?CK 1209 at presentation after laying in floor all night - slowly improving  ? ?DM2 ?As above her home insulin regimen was recently increased from 26 of Lantus to 30 units and meal coverage from 4 units to 7 units - CBG consistently elevated and A1c very high at >15.5 - resume long acting insulin as well and follow  ? ?Peripheral edema ?no DVT on venous duplex - a consequence of ESRD as well as severe systolic CHF - cont PD as tolerates  ? ?ESRD on PD ?Resumed usual PD under guidance of nephrology -there are concerns that PD is not working well for her but reportedly the patient is vehemently opposed to HD ? ? ?Family Communication: No family present at time of exam ?Disposition: From home -anticipate eventual discharge home but likely will require 3-4 days of hospitalization ? ?Objective: ?Blood pressure 115/82, pulse 95, temperature 99 ?F (37.2 ?C), temperature source Oral, resp. rate 20, height 5' (1.524 m), weight 69.1 kg, last menstrual period 03/13/2002, SpO2 95 %. ? ?Intake/Output Summary (Last 24 hours) at 04/17/2021 0837 ?Last data filed at 04/22/2021 9983 ?Gross per 24 hour  ?Intake 11141 ml  ?Output 2604 ml  ?Net 8537 ml  ? ?Filed Weights  ? 04/17/21 1131 04/17/21 1700 04/09/2021 0426  ?Weight: 65.8 kg 65.7 kg 69.1 kg  ? ? ?Examination: ?General: No acute respiratory distress at rest ?Lungs: Poor breath sounds  in bilateral bases with mild crackles - good air movement throughout other fields ?Cardiovascular: Regular rate and rhythm without murmur  ?Abdomen: Nontender, nondistended, soft, bowel sounds positive, no rebound ?Extremities: 1+ bilateral lower extremity edema ? ?CBC: ?Recent Labs  ?Lab 05/02/2021 ?1209 05/03/2021 ?1234 05/03/2021 ?2002 04/17/21 ?0352 04/17/2021 ?0028  ?WBC 10.0  --   --  11.0* 11.4*  ?NEUTROABS 8.2*  --   --   --   --   ?HGB 15.8*   < > 13.6 12.6 12.9  ?HCT 46.5*   < >  40.0 37.2 39.2  ?MCV 85.2  --   --  84.5 84.5  ?PLT 276  --   --  251 287  ? < > = values in this interval not displayed.  ? ? ?Basic Metabolic Panel: ?Recent Labs  ?Lab 04/17/2021 ?1202 04/11/2021 ?1234 05/01/2021 ?2002 04/17/21 ?0352 04/15/2021 ?0028  ?NA 132* 128* 126* 130* 129*  ?K 4.7 4.5 4.0 4.3 4.1  ?CL 93* 94*  --  95* 95*  ?CO2 13*  --   --  16* 15*  ?GLUCOSE 97 95  --  228* 305*  ?BUN 114* >130*  --  120* 115*  ?CREATININE 16.03* 16.40*  --  16.37* 15.04*  ?CALCIUM 6.5*  --   --  5.7* 6.3*  ?MG 1.9  --   --   --   --   ?PHOS 10.8*  --   --   --  9.5*  ? ? ?GFR: ?Estimated Creatinine Clearance: 3.5 mL/min (A) (by C-G formula based on SCr of 15.04 mg/dL (H)). ? ?Liver Function Tests: ?Recent Labs  ?Lab 04/20/2021 ?1202 04/08/2021 ?0028  ?AST 27 16  ?ALT 15 17  ?ALKPHOS 171* 192*  ?BILITOT 0.8 0.9  ?PROT 5.3* 4.0*  ?ALBUMIN 2.0* <1.5*  ? ? ? ?Cardiac Enzymes: ?Recent Labs  ?Lab 04/25/2021 ?1202 04/17/21 ?0352 04/15/2021 ?0028  ?CKTOTAL 1,209* 526* 378*  ? ? ? ?HbA1C: ?Hemoglobin A1C  ?Date/Time Value Ref Range Status  ?02/29/2020 11:26 AM 13.9 (A) 4.0 - 5.6 % Final  ?11/24/2019 10:59 AM 10.3 (A) 4.0 - 5.6 % Final  ? ?HbA1c, POC (controlled diabetic range)  ?Date/Time Value Ref Range Status  ?02/26/2021 04:28 PM 12.2 (A) 0.0 - 7.0 % Final  ?11/18/2018 02:02 PM 7.3 (A) 0.0 - 7.0 % Final  ? ?Hgb A1c MFr Bld  ?Date/Time Value Ref Range Status  ?04/21/2021 12:09 PM >15.5 (H) 4.8 - 5.6 % Final  ?  Comment:  ?  (NOTE) ?**Verified by repeat analysis** ?        Prediabetes: 5.7 - 6.4 ?        Diabetes: >6.4 ?        Glycemic control for adults with diabetes: <7.0 ?  ?05/06/2019 12:22 PM 9.8 (H) 4.8 - 5.6 % Final  ?  Comment:  ?           Prediabetes: 5.7 - 6.4 ?         Diabetes: >6.4 ?         Glycemic control for adults with diabetes: <7.0 ?  ? ? ?CBG: ?Recent Labs  ?Lab 04/17/21 ?0822 04/17/21 ?1418 04/17/21 ?1901 04/17/21 ?2119 04/24/2021 ?6222  ?GLUCAP 163* 202* 237* 281* 332*  ? ? ? ?Scheduled Meds: ? aspirin EC  81 mg Oral Daily   ? brimonidine  1 drop Left Eye BID  ? calcitRIOL  0.5 mcg Oral Daily  ? calcium acetate  2,001 mg Oral TID WC  ?  gabapentin  100 mg Oral Daily  ? gentamicin cream  1 application. Topical Daily  ? heparin  5,000 Units Subcutaneous Q8H  ? insulin aspart  0-6 Units Subcutaneous TID WC  ? ?Continuous Infusions: ? amiodarone    ? dialysis solution 2.5% low-MG/low-CA    ? ? ? LOS: 2 days  ? ?Cherene Altes, MD ?[facility] ?Office  [phone] ?Pager - Text Page per Shea Evans ? ?If 7PM-7AM, please contact night-coverage per Amion ?04/26/2021, 8:37 AM ? ? ? ?

## 2021-04-18 NOTE — Evaluation (Signed)
Physical Therapy Evaluation ?Patient Details ?Name: Ann Bryan ?MRN: [id number] ?DOB: 1960/05/28 ?Today's Date: 05/04/2021 ? ?History of Present Illness ? Pt is a 61 y.o. female who presented 04/25/2021 with generalized weakness (L>R) and syncope fall. CT of head is negative. Pt admitted with NSTEMI with elevated troponin and echo displaying severe LV and RV dysfunction. Pt also with questionable hypoglycemic episode and sepsis. PMH: DM2, HTN, HLD, diabetic neuropathy, and ESRD on PD ?  ?Clinical Impression ? Pt presents with condition above and deficits mentioned below, see PT Problem List. PTA, she was IND without DME, but holding onto furniture for support with household mobility. Pt lives alone in an apartment that has 3 flights of stairs to access with no elevator option. She has very limited family support also and denies having any friends/neighbors who can assist her. She reports having increased difficulty stepping over the tub to access the shower and increased difficulty navigating her stairs, needing to sit to rest often or crawl up/down the stairs. Currently, pt displays deficits in balance, activity tolerance, and lower extremity strength bil (L slightly weaker than R) along with a hx of peripheral neuropathy in bil feet impacting her sensation. She is at high risk for falls, needing minA for transfers and to ambulate up to ~20 ft with a RW today. Due to her need for assistance, high risk for falls, functional decline, difficulty accessing her home up 3 flights of stairs, and limited support at home, recommending short-term rehab at a SNF. Will continue to follow acutely. ?   ? ?Recommendations for follow up therapy are one component of a multi-disciplinary discharge planning process, led by the attending physician.  Recommendations may be updated based on patient status, additional functional criteria and insurance authorization. ? ?Follow Up Recommendations Skilled nursing-short term rehab (<3  hours/day) ? ?  ?Assistance Recommended at Discharge Intermittent Supervision/Assistance  ?Patient can return home with the following ? A little help with walking and/or transfers;A little help with bathing/dressing/bathroom;Assistance with cooking/housework;Assist for transportation;Help with stairs or ramp for entrance ? ?  ?Equipment Recommendations Rolling walker (2 wheels);BSC/3in1;Other (comment) (tub bench)  ?Recommendations for Other Services ?    ?  ?Functional Status Assessment Patient has had a recent decline in their functional status and demonstrates the ability to make significant improvements in function in a reasonable and predictable amount of time.  ? ?  ?Precautions / Restrictions Precautions ?Precautions: Fall ?Precaution Comments: watch HR ?Restrictions ?Weight Bearing Restrictions: No  ? ?  ? ?Mobility ? Bed Mobility ?Overal bed mobility: Needs Assistance ?Bed Mobility: Supine to Sit, Sit to Supine ?  ?  ?Supine to sit: Min guard, HOB elevated ?Sit to supine: Min assist, HOB elevated ?  ?General bed mobility comments: Min guard and extra time using bed rail to sit up R EOB with HOB elevated. MinA to lift legs onto bed with return to supine. ?  ? ?Transfers ?Overall transfer level: Needs assistance ?Equipment used: Rolling walker (2 wheels) ?Transfers: Sit to/from Stand ?Sit to Stand: Min assist ?  ?  ?  ?  ?  ?General transfer comment: Pt hyperextending knees and placing back of legs against EOB for support, needing extra time to power up to stand and gain knee stability, minA to boost and steady with transfer to stand. Cues for hand placement. ?  ? ?Ambulation/Gait ?Ambulation/Gait assistance: Min assist ?Gait Distance (Feet): 20 Feet ?Assistive device: Rolling walker (2 wheels) ?Gait Pattern/deviations: Step-through pattern, Decreased stride length, Knee hyperextension - right,  Knee hyperextension - left, Trunk flexed ?Gait velocity: reduced ?Gait velocity interpretation: <1.8 ft/sec,  indicate of risk for recurrent falls ?  ?General Gait Details: Pt with slow, unsteady gait, hyperextending knees during stance due to poor knee stability, minA for stability. ? ?Stairs ?  ?  ?  ?  ?  ? ?Wheelchair Mobility ?  ? ?Modified Rankin (Stroke Patients Only) ?  ? ?  ? ?Balance Overall balance assessment: Needs assistance ?Sitting-balance support: No upper extremity supported, Feet supported ?Sitting balance-Leahy Scale: Fair ?  ?  ?Standing balance support: Bilateral upper extremity supported, During functional activity, Reliant on assistive device for balance ?Standing balance-Leahy Scale: Poor ?Standing balance comment: Reliant on RW and up to minA ?  ?  ?  ?  ?  ?  ?  ?  ?  ?  ?  ?   ? ? ? ?Pertinent Vitals/Pain Pain Assessment ?Pain Assessment: Faces ?Faces Pain Scale: Hurts a little bit ?Pain Location: legs at scabs of "phosphorus" collection ?Pain Descriptors / Indicators: Discomfort, Sore, Tender ?Pain Intervention(s): Limited activity within patient's tolerance, Monitored during session, Repositioned  ? ? ?Home Living Family/patient expects to be discharged to:: Private residence ?Living Arrangements: Alone ?Available Help at Discharge: Family;Available PRN/intermittently (niece works and lives in [place]) ?Type of Home: Apartment ?Home Access: Stairs to enter ?Entrance Stairs-Rails: None;Right (none on 3 initial steps, R rail ascending on 3 flights to access apartment) ?Entrance Stairs-Number of Steps: 3 + 3 flights ?  ?Home Layout: One level ?Home Equipment: None ?   ?  ?Prior Function Prior Level of Function : Independent/Modified Independent ?  ?  ?  ?  ?  ?  ?Mobility Comments: Holds onto furniture for support. Denies any falls in past 6 months, but did slide onto ground recently when got nauseated. Has difficulty navigating stairs, she has to sit after each set to rest. Sometimes she crawls up/down stairs. ?ADLs Comments: Does not drive, takes Lyft or public transportation. Has had  difficulty stepping over tub for showers. Sometimes rides electric carts to grocery shop. ?  ? ? ?Hand Dominance  ?   ? ?  ?Extremity/Trunk Assessment  ? Upper Extremity Assessment ?Upper Extremity Assessment: Defer to OT evaluation ?  ? ?Lower Extremity Assessment ?Lower Extremity Assessment: RLE deficits/detail;LLE deficits/detail ?RLE Deficits / Details: MMT scores of 4 hip flexion, 5 knee extension, 4+ ankle dorsiflexion; hx of peripheral neuropathy, denies other numbness/tingling ?RLE Sensation: history of peripheral neuropathy ?LLE Deficits / Details: MMT scores of 4- hip flexion, 4+ knee extension, 4 ankle dorsiflexion; hx of peripheral neuropathy, denies other numbness/tingling ?LLE Sensation: history of peripheral neuropathy ?  ? ?Cervical / Trunk Assessment ?Cervical / Trunk Assessment: Kyphotic  ?Communication  ? Communication: No difficulties  ?Cognition Arousal/Alertness: Awake/alert ?Behavior During Therapy: [facility] for tasks assessed/performed ?Overall Cognitive Status: Within Functional Limits for tasks assessed ?  ?  ?  ?  ?  ?  ?  ?  ?  ?  ?  ?  ?  ?  ?  ?  ?  ?  ?  ? ?  ?General Comments General comments (skin integrity, edema, etc.): HR up to 120s, denied lightheadedness/dizziness ? ?  ?Exercises    ? ?Assessment/Plan  ?  ?PT Assessment Patient needs continued PT services  ?PT Problem List Decreased strength;Decreased activity tolerance;Decreased balance;Decreased mobility;Decreased knowledge of use of DME;Cardiopulmonary status limiting activity;Impaired sensation;Decreased skin integrity ? ?   ?  ?PT Treatment Interventions DME instruction;Gait training;Stair training;Functional  mobility training;Therapeutic activities;Therapeutic exercise;Balance training;Neuromuscular re-education;Patient/family education   ? ?PT Goals (Current goals can be found in the Care Plan section)  ?Acute Rehab PT Goals ?Patient Stated Goal: to get stronger ?PT Goal Formulation: With patient ?Time For Goal Achievement:  05/02/21 ?Potential to Achieve Goals: Good ? ?  ?Frequency Min 2X/week ?  ? ? ?Co-evaluation   ?  ?  ?  ?  ? ? ?  ?AM-PAC PT "6 Clicks" Mobility  ?Outcome Measure Help needed turning from your back to your side while in a flat

## 2021-04-18 NOTE — CV Procedure (Addendum)
60 to 80% mid LAD depending upon view. ?Heavily calcified.  The first and second obtuse marginals are small and contain ostial narrowing.  The third obtuse marginal contains 99% stenosis proximal to a bifurcation in its proximal to mid segment.  The fourth OM is totally occluded. ?RCA is totally occluded proximal to mid and receives collaterals from the circumflex and LAD.   ?Left main is widely patent. ?Frequent episodes of supraventricular tachycardia with hypotension to systolic pressures in the 70 to 80 mmHg range. ?LVEDP normal at 9 mmHg. ?Mild pulmonary hypertension with mean pressure of 31 mmHg.  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  Pulmonary vascular resistance 2.79 Woods units. ?Right atrial mean pressure 6 mmHg ?Cardiac output 3.23 L/min with cardiac index 1.94 L/min/m?. ? ?Overall impression is severe mixed etiology heart failure ischemic and nonischemic components with cardiogenic shock based upon low cardiac index.  Atrial arrhythmias contributed to significant reduction in blood pressure below 80 mmHg systolic.  When in sinus rhythm the systolic blood pressure is in the 110 mmHg range. ? ?Very difficult/complicated clinical situation.  Recommend advanced heart failure team assessment.  May need inotropes.  Improve arrhythmia control, possibly with amiodarone.  With end-stage kidney disease, prognosis and treatment options are guarded/are limited. ?

## 2021-04-18 NOTE — Consult Note (Addendum)
?  ?Advanced Heart Failure Team Consult Note ? ? ?Primary Physician: Ladell Pier, MD ?PCP-Cardiologist:  None ? ?Reason for Consultation: Acute systolic CHF/cardiogenic shock ? ?HPI:   ? ?Ann Bryan is seen today for evaluation of acute systolic CHF/cardiogenic shock at the request of Dr. Harrington Challenger. 61 y.o. 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 Had been seen by [facility] for transplant evaluation but it was felt she did not have enough support transplant. Follows with Dr. Joelyn Oms. ? ?Seen in ED 03/23 with nausea and weakness. Poor po intake noted. Had not been using insulin. Given 500 cc fluids and insulin for blood glucose of 500.  ? ?Presented to ED on 04/30/2021 with complaints of generalized weakness, nausea and vomiting. The patient was on the floor the day prior to admission, unable to get up. Missed 1 PD session, but concern may have missed multiple sessions. Apparently had an episode of chest pain a few weeks prior but did not seek medical care at the time. She was hypothermic on arrival and had lactic acid of 2.2. Started on empiric abx and given IV fluids. CT head with no acute process. CXR with cardiomegaly and vascular congestion. HS troponin 2296 (peak 2531). Scr 16, Na 132, K 4.7, hgb 15.8. ECG with sinus rhythm 61 bpm, PAC, prolonged QT 552 ms. She was admitted for further management of near syncope/weakness and fall, possible sepsis and NSTEMI. ? ?Echo: EF 21%, RV moderately reduced, small to moderate pericardial effusion, mild MR. ? ?R/LHC today: 75% m LAD, 99% OM3, total occlusion Lcx beyond OM3, total occlusion p RCA with left-to-right collaterals. LVEDP 9, PA mean 31, RA mean 6, CO 3.23, CI 1.94. Intermittent SVT during case associated with hypotension (SBP 70s-80s). PCI not felt to be a great option. PAD would make mechanical support difficult.  ? ?Patient lives alone on a 3rd floor apartment. Limited support in the community. No family or friends nearby to  assist her. Had become so weak she was crawling up and down stairs to her apartment prior to admission. ? ? ?Review of Systems: [y] = yes, '[ ]'  = no  ? ?General: Weight gain '[ ]' ; Weight loss '[ ]' ; Anorexia '[ ]' ; Fatigue [Y]; Fever '[ ]' ; Chills '[ ]' ; Weakness [Y]  ?Cardiac: Chest pain/pressure '[ ]' ; Resting SOB '[ ]' ; Exertional SOB '[ ]' ; Orthopnea '[ ]' ; Pedal Edema '[ ]' ; Palpitations '[ ]' ; Syncope '[ ]' ; Presyncope '[ ]' ; Paroxysmal nocturnal dyspnea'[ ]'   ?Pulmonary: Cough '[ ]' ; Wheezing'[ ]' ; Hemoptysis'[ ]' ; Sputum '[ ]' ; Snoring '[ ]'   ?GI: Vomiting[Y]; Dysphagia'[ ]' ; Melena'[ ]' ; Hematochezia '[ ]' ; Heartburn'[ ]' ; Abdominal pain '[ ]' ; Constipation '[ ]' ; Diarrhea '[ ]' ; BRBPR '[ ]'   ?GU: Hematuria'[ ]' ; Dysuria '[ ]' ; Nocturia'[ ]'   ?Vascular: Pain in legs with walking '[ ]' ; Pain in feet with lying flat '[ ]' ; Non-healing sores '[ ]' ; Stroke '[ ]' ; TIA '[ ]' ; Slurred speech '[ ]' ;  ?Neuro: Headaches'[ ]' ; Vertigo'[ ]' ; Seizures'[ ]' ; Paresthesias'[ ]' ;Blurred vision '[ ]' ; Diplopia '[ ]' ; Vision changes '[ ]'   ?Ortho/Skin: Arthritis '[ ]' ; Joint pain '[ ]' ; Muscle pain '[ ]' ; Joint swelling '[ ]' ; Back Pain '[ ]' ; Rash '[ ]'   ?Psych: Depression'[ ]' ; Anxiety'[ ]'   ?Heme: Bleeding problems '[ ]' ; Clotting disorders '[ ]' ; Anemia '[ ]'   ?Endocrine: Diabetes [Y]; Thyroid dysfunction'[ ]'  ? ?Home Medications ?Prior to Admission medications   ?Medication Sig Start Date End Date Taking? Authorizing Provider  ?amLODipine (NORVASC)  10 MG tablet Take 1 tablet (10 mg total) by mouth daily. Must have office visit for refills 01/03/20  Yes Newlin, Charlane Ferretti, MD  ?atorvastatin (LIPITOR) 20 MG tablet Take 1 tablet by mouth daily. ?Patient taking differently: Take 20 mg by mouth in the morning, at noon, and at bedtime. 11/30/19  Yes Ladell Pier, MD  ?brimonidine (ALPHAGAN) 0.2 % ophthalmic solution Place 1 drop into the left eye 2 (two) times daily. 01/24/21  Yes [provider]  ?calcitRIOL (ROCALTROL) 0.5 MCG capsule Take 0.5 mcg by mouth daily. 03/11/21  Yes [provider]  ?calcium acetate  (PHOSLO) 667 MG capsule Take 667-2,001 mg by mouth See admin instructions. 2,001 mg with meals 3 times daily and 667 mg 2 times daily with snacks   Yes [provider]  ?carvedilol (COREG) 25 MG tablet Take 1 and 1/2 tablets by mouth twice daily with a meal. Must have office visit for refills ?Patient taking differently: Take 37.5 mg by mouth 2 (two) times daily with a meal. Must have office visit for refills 12/26/19  Yes Ladell Pier, MD  ?cloNIDine (CATAPRES) 0.2 MG tablet Take 0.2 mg by mouth 3 (three) times daily. 06/27/20  Yes [provider]  ?gabapentin (NEURONTIN) 100 MG capsule 1 cap PO every other day ?Patient taking differently: 100 mg every other day. 02/26/21  Yes Ladell Pier, MD  ?hydrALAZINE (APRESOLINE) 100 MG tablet Take 100 mg by mouth in the morning, at noon, and at bedtime. 01/24/21  Yes [provider]  ?insulin aspart (NOVOLOG FLEXPEN) 100 UNIT/ML FlexPen 7 units subcut with meals ?Patient taking differently: 0-10 Units 3 (three) times daily with meals. Dose per sliding scale 02/26/21  Yes Ladell Pier, MD  ?insulin glargine (LANTUS SOLOSTAR) 100 UNIT/ML Solostar Pen Inject 30 Units into the skin daily. 02/26/21  Yes Ladell Pier, MD  ?losartan (COZAAR) 100 MG tablet Take 100 mg by mouth daily. 01/31/21  Yes [provider]  ?Blood Glucose Monitoring Suppl (TRUE METRIX METER) w/Device KIT 1 each by Does not apply route as needed. 11/18/18   Ladell Pier, MD  ?Continuous Blood Gluc Receiver (DEXCOM G6 RECEIVER) DEVI Use as instructed to check blood sugar daily 03/29/20   Shamleffer, Melanie Crazier, MD  ?Continuous Blood Gluc Sensor (DEXCOM G6 SENSOR) MISC 1 Device by Does not apply route as directed. 02/29/20   Shamleffer, Melanie Crazier, MD  ?Continuous Blood Gluc Transmit (DEXCOM G6 TRANSMITTER) MISC 1 Device by Does not apply route as directed. 02/29/20   Shamleffer, Melanie Crazier, MD  ?glucose blood (ONETOUCH VERIO) test strip  Use as instructed to check blood sugar 3 times daily Dx is E11.65 03/23/20   Shamleffer, Melanie Crazier, MD  ?Insulin Pen Needle 32G X 4 MM MISC 1 Device by Does not apply route in the morning, at noon, in the evening, and at bedtime. 08/31/20   Shamleffer, Melanie Crazier, MD  ?Insulin Syringe-Needle U-100 (INSULIN SYRINGE 1CC/31GX5/16") 31G X 5/16" 1 ML MISC Use as directed.  BD 61m x 31G needles 01/22/17   JLadell Pier MD  ?Insulin Syringes, Disposable, U-100 0.5 ML MISC 1 each by Does not apply route 2 (two) times daily. 01/30/14   FBoykin Nearing MD  ?lidocaine-prilocaine (EMLA) cream See admin instructions. APPLY SMALL AMOUNT TO ACCESS SITE (AVF) 1 TO 2 HOURS BEFORE DIALYSIS. COVER WITH OCCLUSIVE DRESSING (Al CorpusWRAP) 03/19/21   [provider]  ?TRUEplus Lancets 28G MISC 1 each by Does not apply route 3 (  three) times daily. 12/20/20   Shamleffer, Melanie Crazier, MD  ? ? ?Past Medical History: ?Past Medical History:  ?Diagnosis Date  ? Anemia   ? Cataract   ? right eye  ? Complication of anesthesia   ?  " I woke up itching from anesthesia many years ago."   ? Depression   ? at times  ? Diabetes mellitus 1986  ?  poorly controlled last hemoglobin A1c  10.5 on January 01, 2010, increase since June 2011 when hemoglobin A1c was 9.3 , determined to be most likely secondary to non-compliance.  ? Diabetic nephropathy ([place])   ? Diabetic retinopathy   ? Dyspnea   ? "when I have too much fluid"  ? End stage renal disease on dialysis [facility])   ? T/Th/Sat [address]  ? Glaucoma   ? History of blood transfusion   ? Hyperlipidemia   ? Hypertension   ? Nausea and vomiting   ? Neuropathy   ? Retinopathy   ? Wears glasses   ? ? ?Past Surgical History: ?Past Surgical History:  ?Procedure Laterality Date  ? ABDOMINAL HYSTERECTOMY   2004  ? AV FISTULA PLACEMENT Left 01/21/2019  ? Procedure: BRACHIOCEPHALIC ARTERIOVENOUS (AV) FISTULA CREATION LEFT ARM;  Surgeon: Serafina Mitchell, MD;  Location: [facility] OR;  Service:  Vascular;  Laterality: Left;  ? EYE SURGERY    ? FISTULA SUPERFICIALIZATION Left 05/20/2019  ? Procedure: FISTULA SUPERFICIALIZATION LEFT PROXIMAL FISTULA;  Surgeon: Serafina Mitchell, MD;  Location: [place];  Ser

## 2021-04-18 NOTE — Interval H&P Note (Signed)
Cath Lab Visit (complete for each Cath Lab visit) ? ?Clinical Evaluation Leading to the Procedure:  ? ?ACS: Yes.   ? ?Non-ACS:   ? ?Anginal Classification: No Symptoms ? ?Anti-ischemic medical therapy: No Therapy ? ?Non-Invasive Test Results: High-risk stress test findings: cardiac mortality >3%/year ? ?Prior CABG: No previous CABG ? ? ? ? ? ?History and Physical Interval Note: ? ?04/21/2021 ?10:41 AM ? ?Ann Bryan  has presented today for surgery, with the diagnosis of chest pain.  The various methods of treatment have been discussed with the patient and family. After consideration of risks, benefits and other options for treatment, the patient has consented to  Procedure(s): ?RIGHT/LEFT HEART CATH AND CORONARY ANGIOGRAPHY (N/A) as a surgical intervention.  The patient's history has been reviewed, patient examined, no change in status, stable for surgery.  I have reviewed the patient's chart and labs.  Questions were answered to the patient's satisfaction.   ? ? ?[name] ? ? ?

## 2021-04-18 NOTE — Progress Notes (Signed)
? ?Progress Note ? ?Patient Name: CLASSIE WENG ?Date of Encounter: 05/03/2021 ? ?[facility] Cardiologist: New ? ?Subjective  ? ?Pt denies CP    No SOB   Some nausea ? ?Inpatient Medications  ?  ?Scheduled Meds: ? aspirin EC  81 mg Oral Daily  ? brimonidine  1 drop Left Eye BID  ? calcitRIOL  0.5 mcg Oral Daily  ? calcium acetate  2,001 mg Oral TID WC  ? gabapentin  100 mg Oral Daily  ? gentamicin cream  1 application. Topical Daily  ? heparin  5,000 Units Subcutaneous Q8H  ? insulin aspart  0-6 Units Subcutaneous TID WC  ? ?Continuous Infusions: ? amiodarone    ? dialysis solution 2.5% low-MG/low-CA    ? ?PRN Meds: ?acetaminophen, calcium acetate, hydrALAZINE, nitroGLYCERIN, ondansetron (ZOFRAN) IV, prochlorperazine  ? ?Vital Signs  ?  ?Vitals:  ? 04/27/2021 0300 04/30/2021 0400 04/09/2021 0426 04/25/2021 0846  ?BP: 106/89 115/82  118/88  ?Pulse: 86 95  75  ?Resp: 17 20  17   ?Temp:  99 ?F (37.2 ?C)    ?TempSrc:  Oral    ?SpO2: 94% 95%  96%  ?Weight:   69.1 kg   ?Height:      ? ? ?Intake/Output Summary (Last 24 hours) at 04/09/2021 0908 ?Last data filed at 04/17/2021 9242 ?Gross per 24 hour  ?Intake 11141 ml  ?Output 2604 ml  ?Net 8537 ml  ? ? ?  04/15/2021  ?  4:26 AM 04/17/2021  ?  5:00 PM 04/17/2021  ? 11:31 AM  ?Last 3 Weights  ?Weight (lbs) 152 lb 5.4 oz 144 lb 13.5 oz 145 lb  ?Weight (kg) 69.1 kg 65.7 kg 65.772 kg  ?   ? ?Telemetry  ?  ?SR - Personally Reviewed ? ?ECG  ?  ?No new  - Personally Reviewed ? ?Physical Exam  ? ?GEN: No acute distress.   ?Neck: No JVD ?Cardiac: RRR, no murmurs  ?Respiratory: Clear to auscultation bilaterally. ?GI: Soft, nontender, non-distended  ?MS: No edema; No deformity. ?Neuro:  Nonfocal  ?Psych: Normal affect  ? ?Labs  ?  ?High Sensitivity Troponin:   ?Recent Labs  ?Lab 04/27/2021 ?1202 04/14/2021 ?2003 04/17/21 ?0352 04/17/21 ?0500  ?TROPONINIHS 2,296* 1,704* 2,531* 2,204*  ?   ?Chemistry ?Recent Labs  ?Lab 04/06/2021 ?1202 04/11/2021 ?1234 04/13/2021 ?2002 04/17/21 ?0352 04/25/2021 ?0028  ?NA 132*  128* 126* 130* 129*  ?K 4.7 4.5 4.0 4.3 4.1  ?CL 93* 94*  --  95* 95*  ?CO2 13*  --   --  16* 15*  ?GLUCOSE 97 95  --  228* 305*  ?BUN 114* >130*  --  120* 115*  ?CREATININE 16.03* 16.40*  --  16.37* 15.04*  ?CALCIUM 6.5*  --   --  5.7* 6.3*  ?MG 1.9  --   --   --   --   ?PROT 5.3*  --   --   --  4.0*  ?ALBUMIN 2.0*  --   --   --  <1.5*  ?AST 27  --   --   --  16  ?ALT 15  --   --   --  17  ?ALKPHOS 171*  --   --   --  192*  ?BILITOT 0.8  --   --   --  0.9  ?GFRNONAA 2*  --   --  2* 3*  ?ANIONGAP 26*  --   --  19* 19*  ?  ?Lipids No results for  input(s): CHOL, TRIG, HDL, LABVLDL, LDLCALC, CHOLHDL in the last 168 hours.  ?Hematology ?Recent Labs  ?Lab 04/11/2021 ?1209 05/05/2021 ?1234 04/12/2021 ?2002 04/17/21 ?0352 04/13/2021 ?0028  ?WBC 10.0  --   --  11.0* 11.4*  ?RBC 5.46*  --   --  4.40 4.64  ?HGB 15.8*   < > 13.6 12.6 12.9  ?HCT 46.5*   < > 40.0 37.2 39.2  ?MCV 85.2  --   --  84.5 84.5  ?MCH 28.9  --   --  28.6 27.8  ?MCHC 34.0  --   --  33.9 32.9  ?RDW 13.3  --   --  13.5 13.5  ?PLT 276  --   --  251 287  ? < > = values in this interval not displayed.  ? ?Thyroid  ?Recent Labs  ?Lab 04/17/2021 ?1202  ?TSH 0.724  ?  ?BNPNo results for input(s): BNP, PROBNP in the last 168 hours.  ?DDimer No results for input(s): DDIMER in the last 168 hours.  ? ?Radiology  ?  ?CT Head Wo Contrast ? ?Result Date: 04/22/2021 ?CLINICAL DATA:  Acute neuro deficit rule out stroke EXAM: CT HEAD WITHOUT CONTRAST TECHNIQUE: Contiguous axial images were obtained from the base of the skull through the vertex without intravenous contrast. RADIATION DOSE REDUCTION: This exam was performed according to the departmental dose-optimization program which includes automated exposure control, adjustment of the mA and/or kV according to patient size and/or use of iterative reconstruction technique. COMPARISON:  None. FINDINGS: Brain: Mild atrophy without hydrocephalus. Patchy white matter hypodensity throughout both cerebral hemispheres. Negative for acute  infarct, hemorrhage, mass Vascular: Negative for hyperdense vessel. Atherosclerotic calcification in the carotid and vertebral arteries bilaterally. Skull: Negative Sinuses/Orbits: Mild mucosal edema paranasal sinuses. Bilateral cataract extraction Other: None IMPRESSION: No acute abnormality Mild atrophy and moderate chronic microvascular ischemic change Electronically Signed   By: Franchot Gallo M.D.   On: 05/01/2021 13:21  ? ?DG Chest Portable 1 View ? ?Result Date: 04/27/2021 ?CLINICAL DATA:  Weakness, peritoneal dialysis patient EXAM: PORTABLE CHEST 1 VIEW COMPARISON:  03/28/2021 FINDINGS: Similar low lung volumes with cardiac enlargement and central vascular congestion. Basilar atelectasis suspected. No large effusion or definite CHF pattern. No pneumothorax. Trachea midline. Aorta atherosclerotic. Degenerative changes noted of the spine. IMPRESSION: Similar low lung volumes with cardiomegaly and vascular congestion. Basilar atelectasis. Aortic Atherosclerosis (ICD10-I70.0). Electronically Signed   By: Jerilynn Mages.  Shick M.D.   On: 04/21/2021 12:12  ? ?ECHOCARDIOGRAM COMPLETE ? ?Result Date: 04/22/2021 ?   ECHOCARDIOGRAM REPORT   Patient Name:   LADEANA LAPLANT Date of Exam: 04/25/2021 Medical Rec #:  [id number]    Height:       60.0 in Accession #:    [id number]   Weight:       142.0 lb Date of Birth:  [birth year]   BSA:          1.614 m? Patient Age:    61 years     BP:           111/80 mmHg Patient Gender: F            HR:           64 bpm. Exam Location:  Inpatient Procedure: 2D Echo, 3D Echo, Cardiac Doppler, Color Doppler and Intracardiac            Opacification Agent Indications:    NSTEMI I21.4  History:        Patient has no prior history of  Echocardiogram examinations.                 Signs/Symptoms:Dyspnea; Risk Factors:Diabetes, Dyslipidemia and                 Hypertension.  Sonographer:    Bernadene Person RDCS Referring Phys: [id number] Roland  1. Left ventricular ejection fraction by 3D volume is  21 %. The left ventricle has severely decreased function. The left ventricle demonstrates global hypokinesis. Left ventricular diastolic parameters are consistent with Grade I diastolic dysfunction (impaired relaxation). Elevated left ventricular end-diastolic pressure.  2. Right ventricular systolic function is moderately reduced. The right ventricular size is mildly enlarged. There is normal pulmonary artery systolic pressure.  3. Left atrial size was mildly dilated.  4. There is small to moderate circumferential pericardial effusion. There is no evidence of cardiac tamponade.  5. The mitral valve is normal in structure. Mild mitral valve regurgitation. No evidence of mitral stenosis.  6. The aortic valve is normal in structure. Aortic valve regurgitation is not visualized. No aortic stenosis is present.  7. The inferior vena cava is normal in size with greater than 50% respiratory variability, suggesting right atrial pressure of 3 mmHg. Comparison(s): No prior Echocardiogram. FINDINGS  Left Ventricle: Left ventricular ejection fraction by 3D volume is 21 %. The left ventricle has severely decreased function. The left ventricle demonstrates global hypokinesis. Definity contrast agent was given IV to delineate the left ventricular endocardial borders. The left ventricular internal cavity size was normal in size. There is no left ventricular hypertrophy. Left ventricular diastolic parameters are consistent with Grade I diastolic dysfunction (impaired relaxation). Elevated left ventricular end-diastolic pressure. Right Ventricle: The right ventricular size is mildly enlarged. No increase in right ventricular wall thickness. Right ventricular systolic function is moderately reduced. There is normal pulmonary artery systolic pressure. The tricuspid regurgitant velocity is 1.35 m/s, and with an assumed right atrial pressure of 3 mmHg, the estimated right ventricular systolic pressure is 34.3 mmHg. Left Atrium: Left  atrial size was mildly dilated. Right Atrium: Right atrial size was normal in size. Pericardium: There is small to moderate circumferential pericardial effusion. There is no evidence of cardiac tamponade.

## 2021-04-18 NOTE — Progress Notes (Addendum)
Inpatient Diabetes Program Recommendations ? ?AACE/ADA: New Consensus Statement on Inpatient Glycemic Control  ? ?Target Ranges:  Prepandial:   less than 140 mg/dL ?     Peak postprandial:   less than 180 mg/dL (1-2 hours) ?     Critically ill patients:  140 - 180 mg/dL  ? ? Latest Reference Range & Units 04/19/2021 18:36 04/10/2021 19:44 04/22/2021 22:01 04/17/21 08:22 04/17/21 14:18 04/17/21 19:01 04/17/21 21:19 04/14/2021 06:09  ?Glucose-Capillary 70 - 99 mg/dL 105 (H) 254 (H) 137 (H) 163 (H) 202 (H) 237 (H) 281 (H) 332 (H)  ? ? Latest Reference Range & Units 04/15/2021 17:21  ?Glucose-Capillary 70 - 99 mg/dL 18 (LL)  ? ? Latest Reference Range & Units 02/29/20 11:26 04/25/2021 12:09  ?Hemoglobin A1C 4.8 - 5.6 % 13.9 ! >15.5 (H)  ? ? Latest Reference Range & Units 02/26/21 16:28  ?HbA1c, POC (controlled diabetic range) 0.0 - 7.0 % 12.2 !  ? ?Review of Glycemic Control ? ?Diabetes history: DM2 ?Outpatient Diabetes medications: Lantus 30 units daily (reports taking 23 units daily), Novolog 0-10 units TID with meals ?Current orders for Inpatient glycemic control: Novolog 0-6 units TID with meals ? ?Inpatient Diabetes Program Recommendations:   ? ?Insulin: Please consider ordering Semglee 10 units daily and Novolog 0-5 units QHS. ? ?NOTE: Patient admitted with NSTEMI, near syncope, and question of sepsis. Per chart, patient has seen Dr. Kelton Pillar (Endocrinologist) and was last seen 02/29/20. Noted patient seen Dr. Wynetta Emery at [facility] on 02/26/21 and per office note patient reported taking Lantus 26 units daily and Novolog 4 units with meals. Per note, "Taking both consistently.  Checking BS once a day; readings have been in the 400s.  On average she eats 1 meal a day.  Does not overeat.  Denies using junk foods.Has equipment for insulin pump and Dexcom but had not been using because of poor vision.  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  Cataract extraction 1 mth ago.  Since extraction she is  now seeing better out of the right eye, so she plans to do get back on insulin pump and Dexcom.  Has to f/u with [employer] to set up insulin pump." Will plan to speak with patient today. ? ?Addendum 04/06/2021 @ 10:20-Spoke with patient at bedside about diabetes and home regimen for diabetes control. Patient reports being followed by providers at [facility] and [facility] for diabetes management and currently taking Lantus 23 units daily and Novolog with meals (based on scale). Patient confirms that she was seeing Dr. Kelton Pillar but not seen in a long time. Inquired about working with Leonia Reader, RN, Diabetes Educator and patient states she will not ever go back to see her again (did not elaborate on why).  Patient reports that she tries to take the Lantus every day but she admits that sometimes she forgets. Patient admits that she rarely takes the Novolog. Patient states that when she had hypoglycemia on 04/12/2021, she thought it was due to taking too much insulin. She states she thought she took Novolog but not sure how much she took; states everything is kind of fuzzy from that day. She states she remembers being on the floor at home and eating glucose tablets.  Patient reports that she gives herself insulin in the arms and seldomly uses her thigh. Palpated bilateral arms and no hardened areas noted on arms. Encouraged patient to be sure to rotate injection sites.  Patient is not  checking glucose very often but notes when she does it is "high".  Patient states that she has a Dexcom CGM and is suppose to be seeing Lurena Joiner, Pharmacist at [facility] about getting started on it. Patient reports she had an appointment scheduled with Lurena Joiner earlier this week but she missed that appointment.  Discussed A1C results (>15.5% on 04/22/2021) and explained that current A1C indicates an average glucose of over 398 mg/dl over the past 2-3 months. Discussed glucose and A1C goals. Discussed importance of checking CBGs and  maintaining good CBG control to prevent long-term and short-term complications. Explained how hyperglycemia leads to damage within blood vessels which lead to the common complications seen with uncontrolled diabetes. Stressed to the patient the importance of improving glycemic control to prevent further complications from uncontrolled diabetes. Stressed to patient that if DM is not better controlled, she has increased risk of further complications. Asked patient to be sure she is taking Lantus every day around the same time (get on a schedule) and also to try to start taking Novolog before every meal. Encouraged patient to follow up with [facility] regarding getting started on Dexcom and for consistent follow up. Patient states she has plenty of insulin and testing supplies at home.  Patient verbalized understanding of information discussed and reports no further questions at this time related to diabetes. Called [facility] (spoke with Santiago Glad) to see if patient had an appointment scheduled this week with Lurena Joiner, Pharmacist and they have no record of appointment. Patient does not have any follow up appointment already scheduled. Santiago Glad states that she would leave a message for Dr. Durenda Age nurse to see if they can reach out to patient about getting an appointment scheduled.  ? ?Thanks, ?Barnie Alderman, RN, MSN, CDE ?Diabetes Coordinator ?Inpatient Diabetes Program ?[phone] (Team Pager from 8am to 5pm) ? ?

## 2021-04-18 NOTE — Plan of Care (Signed)
  Problem: Education: Goal: Knowledge of General Education information will improve Description: Including pain rating scale, medication(s)/side effects and non-pharmacologic comfort measures Outcome: Progressing   Problem: Health Behavior/Discharge Planning: Goal: Ability to manage health-related needs will improve Outcome: Progressing   Problem: Clinical Measurements: Goal: Will remain free from infection Outcome: Progressing   Problem: Activity: Goal: Risk for activity intolerance will decrease Outcome: Progressing   Problem: Nutrition: Goal: Adequate nutrition will be maintained Outcome: Progressing   Problem: Pain Managment: Goal: General experience of comfort will improve Outcome: Progressing   Problem: Safety: Goal: Ability to remain free from injury will improve Outcome: Progressing   Problem: Skin Integrity: Goal: Risk for impaired skin integrity will decrease Outcome: Progressing   

## 2021-04-18 NOTE — Significant Event (Signed)
Called with calcium of 6.3 on this dialysis patient.  Up from 5.7 previously. ? ?1) corrected calcium is going to be at least 8.3 given that her albumin is < 1.5. ?2) Phos of 9.5. ? ?Going to hold off on any calcium replacement at this time. ? ?Will also send secure chat to nephrology. ?

## 2021-04-18 NOTE — Significant Event (Addendum)
Called by RN: Pt with first feeling of cold foot on R side, and now progressive pain in R foot following a LHC today where they accessed the R groin. ? ?1) Pt does have dopplerable pulse in that foot, no skin changes, mottling, etc. ?2) Concerned pt may be having some post-access complication.  Asked RN to call and notify cardiology STAT for recs regarding evaluation and further treatment. ?3) putting in PRN morphine for pain. ? ?Spoke with cards on call: ?1) ordered CTA of RLE to look for vascular complication (pt is aneuric ESRD at baseline so not worried about kidney injury with second dye load). ?2) doesn't sound like a surgical emergency at this point given: no skin changes, and dopplerable pulse. ?

## 2021-04-18 NOTE — Progress Notes (Signed)
Date and time results received: 05/04/2021 0116 ?(use smartphrase ".now" to insert current time) ? ?Test: Blood calcium ?Critical Value: 6.3 ? ?Name of Provider Notified: Sheran Luz ? ?Orders Received? Or Actions Taken?: MD returned call and no new orders at this time. ?

## 2021-04-19 DIAGNOSIS — R570 Cardiogenic shock: Secondary | ICD-10-CM | POA: Diagnosis not present

## 2021-04-19 DIAGNOSIS — I214 Non-ST elevation (NSTEMI) myocardial infarction: Secondary | ICD-10-CM | POA: Diagnosis not present

## 2021-04-19 DIAGNOSIS — I5020 Unspecified systolic (congestive) heart failure: Secondary | ICD-10-CM | POA: Diagnosis not present

## 2021-04-19 DIAGNOSIS — Z515 Encounter for palliative care: Secondary | ICD-10-CM | POA: Diagnosis not present

## 2021-04-19 LAB — GLUCOSE, CAPILLARY
Glucose-Capillary: 105 mg/dL — ABNORMAL HIGH (ref 70–99)
Glucose-Capillary: 470 mg/dL — ABNORMAL HIGH (ref 70–99)
Glucose-Capillary: 487 mg/dL — ABNORMAL HIGH (ref 70–99)
Glucose-Capillary: 337 mg/dL — ABNORMAL HIGH (ref 70–99)
Glucose-Capillary: 215 mg/dL — ABNORMAL HIGH (ref 70–99)
Glucose-Capillary: 338 mg/dL — ABNORMAL HIGH (ref 70–99)

## 2021-04-19 LAB — COMPREHENSIVE METABOLIC PANEL
ALT: 15 U/L (ref 0–44)
AST: 13 U/L — ABNORMAL LOW (ref 15–41)
Albumin: <1.5 g/dL — ABNORMAL LOW (ref 3.5–5.0)
Alkaline Phosphatase: 170 U/L — ABNORMAL HIGH (ref 38–126)
Anion gap: 19 — ABNORMAL HIGH (ref 5–15)
BUN: 105 mg/dL — ABNORMAL HIGH (ref 6–20)
CO2: 18 mmol/L — ABNORMAL LOW (ref 22–32)
Calcium: 6.5 mg/dL — ABNORMAL LOW (ref 8.9–10.3)
Chloride: 91 mmol/L — ABNORMAL LOW (ref 98–111)
Creatinine, Ser: 14.55 mg/dL — ABNORMAL HIGH (ref 0.44–1.00)
GFR, Estimated: 3 mL/min — ABNORMAL LOW (ref >60)
Glucose, Bld: 347 mg/dL — ABNORMAL HIGH (ref 70–99)
Potassium: 4.2 mmol/L (ref 3.5–5.1)
Sodium: 128 mmol/L — ABNORMAL LOW (ref 135–145)
Total Bilirubin: 0.6 mg/dL (ref 0.3–1.2)
Total Protein: 4.3 g/dL — ABNORMAL LOW (ref 6.5–8.1)

## 2021-04-19 LAB — CBC
HCT: 40.3 % (ref 36.0–46.0)
Hemoglobin: 13.6 g/dL (ref 12.0–15.0)
MCH: 28.3 pg (ref 26.0–34.0)
MCHC: 33.7 g/dL (ref 30.0–36.0)
MCV: 83.8 fL (ref 80.0–100.0)
Platelets: 304 10*3/uL (ref 150–400)
RBC: 4.81 MIL/uL (ref 3.87–5.11)
RDW: 13.6 % (ref 11.5–15.5)
WBC: 12.5 10*3/uL — ABNORMAL HIGH (ref 4.0–10.5)
nRBC: 0.3 % — ABNORMAL HIGH (ref 0.0–0.2)

## 2021-04-19 LAB — LACTIC ACID, PLASMA: Lactic Acid, Venous: 2.2 mmol/L (ref 0.5–1.9)

## 2021-04-19 MED ORDER — DELFLEX-LC/2.5% DEXTROSE 394 MOSM/L IP SOLN
INTRAPERITONEAL | Status: DC
  Administered 2021-04-19: 5000 mL via INTRAPERITONEAL

## 2021-04-19 MED ORDER — ATORVASTATIN CALCIUM 80 MG PO TABS
80.0000 mg | ORAL_TABLET | Freq: Every day | ORAL | Status: DC
  Administered 2021-04-19 – 2021-04-25 (×7): 80 mg via ORAL
  Filled 2021-04-19 (×7): qty 1

## 2021-04-19 MED ORDER — HYDROMORPHONE HCL 1 MG/ML IJ SOLN
0.5000 mg | INTRAMUSCULAR | Status: DC | PRN
  Administered 2021-04-19: 1 mg via INTRAVENOUS
  Administered 2021-04-19 – 2021-04-21 (×9): 0.5 mg via INTRAVENOUS
  Administered 2021-04-21: 1 mg via INTRAVENOUS
  Administered 2021-04-21 (×2): 0.5 mg via INTRAVENOUS
  Administered 2021-04-22 (×2): 1 mg via INTRAVENOUS
  Filled 2021-04-19 (×2): qty 0.5
  Filled 2021-04-19 (×4): qty 1
  Filled 2021-04-19 (×10): qty 0.5

## 2021-04-19 MED ORDER — AMIODARONE HCL 200 MG PO TABS
200.0000 mg | ORAL_TABLET | Freq: Two times a day (BID) | ORAL | Status: DC
  Administered 2021-04-19 – 2021-04-24 (×12): 200 mg via ORAL
  Filled 2021-04-19 (×12): qty 1

## 2021-04-19 MED ORDER — INSULIN GLARGINE-YFGN 100 UNIT/ML ~~LOC~~ SOLN
10.0000 [IU] | Freq: Every day | SUBCUTANEOUS | Status: DC
  Administered 2021-04-19 (×2): 10 [IU] via SUBCUTANEOUS
  Filled 2021-04-19 (×3): qty 0.1

## 2021-04-19 MED ORDER — INSULIN ASPART 100 UNIT/ML IJ SOLN
6.0000 [IU] | Freq: Once | INTRAMUSCULAR | Status: AC
  Administered 2021-04-19: 6 [IU] via SUBCUTANEOUS

## 2021-04-19 NOTE — Consult Note (Signed)
?Palliative Medicine Inpatient Consult Note ? ?Consulting Provider: Cherene Altes, MD ? ?Reason for consult:   ?[place] Palliative Medicine Consult  ?Reason for Consult? Goals of care  ? ? ?HPI:  ?Per intake H&P --> 61 year old with a history of DM2, HTN, HLD, diabetic neuropathy, and ESRD on PD who presented to the ED with severe generalized weakness for approximately 6 to 7 days and an eventual pre-syncopal spell resulting in a fall without loss of consciousness.  After her fall she was too weak to get up and spent the night on the floor, thusly missing her PD.  A neighbor called EMS who arrived to find the patient on the floor with stable vital signs and a normal CBG.  In the ED the patient was hypothermic with temperature 95.4.  WBC was borderline at 10. ? ?Palliative care has been asked to get involved to discuss goals of care in the setting of multiple co-morbidities.  ? ?Clinical Assessment/Goals of Care: ? ?*Please note that this is a verbal dictation therefore any spelling or grammatical errors are due to the "[employer]" system interpretation. ? ?I have reviewed medical records including EPIC notes, labs and imaging, received report from bedside RN, assessed the patient. She was mentally oscillating in conversation and kept falling asleep. She did consent for me to reach out to her niece, Tommie Sams. ?  ?I called [name] Combo to further discuss diagnosis prognosis, GOC, EOL wishes, disposition and options. ?  ?I introduced Palliative Medicine as specialized medical care for people living with serious illness. It focuses on providing relief from the symptoms and stress of a serious illness. The goal is to improve quality of life for both the patient and the family. ? ?Medical History Review and Understanding: ? 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 ? ?We discussed that Neria has previously been capable of caring for her health. ? ?Social  History: ? ?Liddy is from [place], [place]. She has never been married nor had any children. She endorses a niece, Tommie Sams who she is close to. Montia worked in [employer] until she became disabled. She enjoys watching television. She is of the [employer]. Per her niece until about last November she would go out and enjoyed things like photography, Over the last year she has really declined. Her niece shares that she lives in [place] and has not been able to keep close eyes on her.  ? ?Functional and Nutritional State: ? ?Blakelynn was prior quite functional per her niece and able to do everything for herself. ? ?She had a good appetite.  ? ?Advance Directives: ? ?A detailed discussion was had today regarding advanced directives.  Patient never completed these though her niece, Tommie Sams has previously been involved in support her health care decisions.  ? ?Code Status: ? ?Encouraged patient/family to consider DNR/DNI status understanding evidenced based poor outcomes in similar hospitalized patient, as the cause of arrest is likely associated with advanced chronic/terminal illness rather than an easily reversible acute cardio-pulmonary event. I explained that DNR/DNI does not change the medical plan and it only comes into effect after a person has arrested (died).  It is a protective measure to keep [place] from harming the patient in their last moments of life.  ? ?Tommie Sams shares in knowing her aunt she would likely wish to be a DNR but she wants to review and discuss this with her further. ? ?Discussion: ? ?Tommie Sams shares with  me that from her observation bath had been starting to decline prior to November 2022.  She expresses that the holidays are Janda's favorite time of year most notably Thanksgiving.  There had been a switch this year whereby Anabel did not want to go to [place] to spend the holidays with her family.  In more recent months that did not want to go to a wedding reception. ? ?We reviewed  that if Berta is at a point where peritoneal dialysis is no longer effective and she would never want hemodialysis then we would need to consider her life would probably be quite short.  I had been on hemodialysis years ago and had a terrible response to that in the setting of pruritus which was extremely troubling and never dissipated despite multiple medications. ? ?We reviewed the fact that his triple vessel coronary artery disease discovered which is what caused her NSTEMI.  I shared that this is something which could at any time lead to the end of her life which is even more so why additional conversations pertaining to goals are important.  Tommie Sams expresses she will be here this weekend and would like to meet with myself.  In the meanwhile she is asking for medical updates from the cardiologist, nephrologist and primary medical team which I have shared I will try to get for her. ? ?Discussed the importance of continued conversation with family and their  medical providers regarding overall plan of care and treatment options, ensuring decisions are within the context of the patients values and GOCs. ? ?Decision Maker: ?Tommie Sams combo (niece): [phone] ? ?SUMMARY OF RECOMMENDATIONS   ?Full Code for the time being --> Additional conversations will take place throughout the weekend patient not able to understand and niece would like to speak to her if she becomes more mentally aware ? ?Continue current care ? ?Appreciate Hospitalist calling niece with a comprehensive medical update ? ?Further conversation on whether or not patient would desire iHD will take place when patients niece visits ? ?Ongoing Palliative care support ? ?Code Status/Advance Care Planning: ?FULL CODE ? ?Palliative Prophylaxis:  ?Aspiration, Bowel Regimen, Delirium Protocol, Frequent Pain Assessment, Oral Care, Palliative Wound Care, and Turn Reposition ? ?Additional Recommendations (Limitations, Scope, Preferences): ?Continue current  tx ? ?Psycho-social/Spiritual:  ?Desire for further Chaplaincy support: No ?Additional Recommendations: Education on chronic disease burden ?  ?Prognosis: Poor overall, patient would appropriate for hospice considerations ? ?Discharge Planning: Discharge plan will likely be to SNF ? ?Vitals:  ? 04/07/2021 2321 04/19/21 0458  ?BP: 102/72 113/76  ?Pulse: 100 98  ?Resp: 16 16  ?Temp: 98.3 ?F (36.8 ?C) 98.1 ?F (36.7 ?C)  ?SpO2: 99% 100%  ? ? ?Intake/Output Summary (Last 24 hours) at 04/19/2021 1503 ?Last data filed at 04/19/2021 0715 ?Gross per 24 hour  ?Intake 0.8 ml  ?Output 1182 ml  ?Net -1181.2 ml  ? ?Last Weight  Most recent update: 04/19/2021  5:10 AM  ? ? Weight  ?66.2 kg (145 lb 15.1 oz)  ?      ? ?  ? ?Gen:  Older AA F ?HEENT: moist mucous membranes ?CV: Irregular rate and rhythm ?PULM: On RA, breathing nonlabored ?ABD: soft/nontender ?EXT: No edema  ?Neuro: Disoriented ? ?PPS: 30-40% ? ? ?This conversation/these recommendations were discussed with patient primary care team, Dr. Thereasa Solo ? ?MDM [facility] ?______________________________________________________ ?Tacey Ruiz ?[place] Team ?Team Cell Phone: [phone] ?Please utilize secure chat with additional questions, if there is no response within 30 minutes please  call the above phone number ? ?Palliative Medicine Team providers are available by phone from 7am to 7pm daily and can be reached through the team cell phone.  ?Should this patient require assistance outside of these hours, please call the patient's attending physician. ? ?

## 2021-04-19 NOTE — Progress Notes (Signed)
?[facility] ?Progress Note  ? ?Subjective:   seen in her room. She is a bit lethargic, able to answer some simple questions.  ? ?Objective ?Vitals:  ? 04/25/2021 2321 04/19/21 0458 04/19/21 0510 04/19/21 1557  ?BP: 102/72 113/76  (!) 119/36  ?Pulse: 100 98  93  ?Resp: 16 16  18   ?Temp: 98.3 ?F (36.8 ?C) 98.1 ?F (36.7 ?C)  97.9 ?F (36.6 ?C)  ?TempSrc: Oral Oral  Oral  ?SpO2: 99% 100%  96%  ?Weight:   66.2 kg   ?Height:      ? ?Physical Exam ?General: alert female in NAD ?Heart: RRR, no murmurs, rubs or gallops ?Lungs: CTA bilaterally without wheezing, rhonchi or rales ?Abdomen: Soft, non-tender, non-distended, +BS ?Extremities: trace- 1+ edema left lower extremity ?Dialysis Access: PD cath in lower abd, clean and dry ? ? ? ?Dialysis Orders: ?Nephrologist: Dr. Joelyn Oms. RN: Melisa ?PD 7 days per week, EDW 62kg, 4 exchanges, Dwell time 1.5 hours, Fill volume 2.2L, no day exchanges ?2 exchanges with 1.5% dextrose and two exchanges with 2.5% dextrose ? ?Assessment/Plan: ? Generalized weakness: Difficult to tease out.  Sepsis work up per primary team. Also looking at cardiac ischemia as a possiblity.  Uremia may also contributing, see below. Plan is for PT ?ESRD on PD: Outpatient RN has concerns about patient's ability to do PD at home lately. Her BUN is pretty elevated. She is very opposed to hemodialysis and her old fistula is not functional. We have added an exchange here to see if this helps at all (gen weakness, etc..).  ? Hypertension/volume: BP is controlled. Some +edema on exam. Changed to 2.5 % fluids ? Anemia: Hgb elevated, no ESA indicated ? Metabolic bone disease: phos 10.8. Admits to issues with binder compliance. Will resume calcium acetate and calcitriol. Corrected calcium is adequate ? Nutrition:  Albumin quite low with poor intake. Renal/carb modified diet ?NSTEMI: Troponin elevated, management per primary team ?T2DM: Poor control with both hypoglycemic and hyperglycemic episodes. Management  per primary team.  ?Dispo/ GOC - difficult situation, pt seems to possibly be failing PD, but refuses HD (has refused this for quite some time) and cannot go to SNF on PD. She has FTT which could be from PD membrane failure, or something else, but nothing treatable is suspected to be found. She also has new dx of seriously bad heart disease, which will likely add to her decline. We will have to discuss hospice transition because if refusing HD we don't have a lot of other options seeing that she probably won't be able to care for herself at home alone.  ?  ? ?Kelly Splinter, MD ?04/19/2021, 4:16 PM ? ?Recent Labs  ?Lab 05/02/2021 ?1202 04/22/2021 ?1209 04/22/2021 ?0028 04/15/2021 ?1139 04/11/2021 ?1147 04/19/21 ?1941  ?HGB  --    < > 12.9   < > 12.2 13.6  ?ALBUMIN 2.0*  --  <1.5*  --   --  <1.5*  ?CALCIUM 6.5*   < > 6.3*  --   --  6.5*  ?PHOS 10.8*  --  9.5*  --   --   --   ?CREATININE 16.03*   < > 15.04*  --   --  14.55*  ?K 4.7   < > 4.1   < > 3.5 4.2  ? < > = values in this interval not displayed.  ? ? ? ?Medications: ? sodium chloride    ? dialysis solution 2.5% low-MG/low-CA    ? ? amiodarone  200 mg  Oral BID  ? aspirin  81 mg Oral Daily  ? atorvastatin  80 mg Oral Daily  ? brimonidine  1 drop Left Eye BID  ? calcitRIOL  0.5 mcg Oral Daily  ? calcium acetate  2,001 mg Oral TID WC  ? gabapentin  100 mg Oral Daily  ? gentamicin cream  1 application. Topical Daily  ? heparin  5,000 Units Subcutaneous Q8H  ? insulin aspart  0-6 Units Subcutaneous TID WC  ? insulin glargine-yfgn  10 Units Subcutaneous Daily  ? loratadine  10 mg Oral Daily  ? sodium chloride flush  3 mL Intravenous Q12H  ? ? ? ? ?

## 2021-04-19 NOTE — Plan of Care (Signed)

## 2021-04-19 NOTE — TOC Initial Note (Signed)
Transition of Care (TOC) - Initial/Assessment Note  ? ? ?Patient Details  ?Name: Ann Bryan ?MRN: [id number] ?Date of Birth: May 09, 1960 ? ?Transition of Care (TOC) CM/SW Contact:    ?Reece Agar, LCSWA ?Phone Number: ?04/19/2021, 3:17 PM ? ?Clinical Narrative:                 ?CSW received SNF consult. CSW met with pt at bedside. CSW introduced self and explained role at the hospital. Pt reports that PTA the pt lived at home alone. PT reports pt was IND at home with no DME but using furniture for standing support. Pt lives in an apartment with 3 flights of stairs with no elevator, pt would need a sitting break in between each flight of stairs to get to her apartment. Pt ambulated 10f minA with PT w/ rolling walker. PT reports pt has very limited family support.  ? ?CSW reviewed PT/OT recommendations for SNF. Pt reports being agreeable with SNF. Pt gave CSW permission to fax out to facilities in the area. Pt has no preference of facility at this time, pt states she ha never been to a SNF. CSW gave pt medicare.gov rating list to review.  ? ?CSW will continue to follow. ?  ? ?Expected Discharge Plan: [place]?Barriers to Discharge: Continued Medical Work up ? ? ?Patient Goals and CMS Choice ?  ?CMS Medicare.gov Compare Post Acute Care list provided to:: Patient ?Choice offered to / list presented to : Patient ? ?Expected Discharge Plan and Services ?Expected Discharge Plan: [place]?In-house Referral: Clinical Social Work ?Discharge Planning Services: CM Consult ?[place] Acute Care Choice: [place]?Living arrangements for the past 2 months: Apartment ?                ?  ?  ?  ?  ?  ?  ?  ?  ?  ?  ? ?Prior Living Arrangements/Services ?Living arrangements for the past 2 months: Apartment ?Lives with:: Self ?Patient language and need for interpreter reviewed:: Yes ?Do you feel safe going back to the place where you live?: Yes      ?Need for Family Participation in  Patient Care: Yes (Comment) ?Care giver support system in place?: Yes (comment) ?  ?Criminal Activity/Legal Involvement Pertinent to Current Situation/Hospitalization: No - Comment as needed ? ?Activities of Daily Living ?  ?  ? ?Permission Sought/Granted ?Permission sought to share information with : Case Manager, Family Supports, PCP ?Permission granted to share information with : Yes, Verbal Permission Granted ? Share Information with NAME: Ann Bryan ?   ? Permission granted to share info w Relationship: niece ? Permission granted to share info w Contact Information: [phone]? ?Emotional Assessment ?Appearance:: Appears stated age ?Attitude/Demeanor/Rapport: Lethargic ?Affect (typically observed): Accepting ?Orientation: : Oriented to Self, Oriented to Place, Oriented to  Time, Oriented to Situation ?  ?Psych Involvement: No (comment) ? ?Admission diagnosis:  Lactic acidosis [E87.20] ?Elevated troponin [R77.8] ?NSTEMI (non-ST elevated myocardial infarction) ([place] [I21.4] ?Hypothermia, initial encounter [T68.XXXA] ?Patient Active Problem List  ? Diagnosis Date Noted  ? Elevated troponin   ? NSTEMI (non-ST elevated myocardial infarction) ([place] 04/20/2021  ? Dermatitis 02/26/2021  ? Diabetic peripheral neuropathy ([facility] 02/26/2021  ? Type 2 diabetes mellitus with hyperglycemia, with long-term current use of insulin ([facility] 06/03/2019  ? Type 2 diabetes mellitus with proliferative retinopathy, with long-term current use of insulin ([facility] 06/03/2019  ? Type 2 diabetes mellitus with diabetic polyneuropathy, with long-term current  use of insulin ([place]) 06/03/2019  ? Type 2 diabetes mellitus with chronic kidney disease on chronic dialysis, with long-term current use of insulin ([place]) 06/03/2019  ? Dyslipidemia 06/03/2019  ? ESRD on dialysis ([place]) 05/20/2019  ? Hyperkalemia, diminished renal excretion 01/11/2019  ? ESRD (end stage renal disease) on dialysis ([place]) 11/18/2018  ? Normocytic anemia 11/18/2018  ? Diabetes  mellitus without complication ([place]) 58/59/2924  ? Nausea and vomiting 09/28/2017  ? Insomnia 06/05/2017  ? Chronic seasonal allergic rhinitis 12/10/2015  ? Arthralgia of both hands 12/10/2015  ? Severe episode of recurrent major depressive disorder, without psychotic features ([place]) 12/10/2015  ? NEPHROPATHY, DIABETIC 09/23/2006  ? Essential hypertension 12/23/2005  ? DM (diabetes mellitus) type II uncontrolled with eye manifestation 01/07/1984  ? ?PCP:  Ladell Pier, MD ?Pharmacy:   ?[place] [place] ?[address] ?[place] [address] ?Phone: [phone] Fax: [phone] ? ? ? ? ?Social Determinants of Health (SDOH) Interventions ?  ? ?Readmission Risk Interventions ?   ? View : No data to display.  ?  ?  ?  ? ? ? ?

## 2021-04-19 NOTE — Care Management Important Message (Signed)
Important Message ? ?Patient Details  ?Name: Ann Bryan ?MRN: [id number] ?Date of Birth: [birth year] ? ? ?Medicare Important Message Given:  Yes ? ? ? ? ?Dijuan Sleeth ?04/19/2021, 2:19 PM ?

## 2021-04-19 NOTE — Progress Notes (Addendum)
? ? Advanced Heart Failure Rounding Note ? ?PCP-Cardiologist: None  ? ?Subjective:   ? ?Completed PD last night. She doesn't know how much volume was removed. ? ?Lying flat in bed this am. No dyspnea at rest, orthopnea or PND. ? ?Had right foot pain last night. Had right femoral access for cath. Better this am. ? ?SBP 100s-110s ? ? ?Objective:   ?Weight Range: ?66.2 kg ?Body mass index is 28.5 kg/m?.  ? ?Vital Signs:   ?Temp:  [97.7 ?F (36.5 ?C)-98.3 ?F (36.8 ?C)] 98.1 ?F (36.7 ?C) (04/14 0458) ?Pulse Rate:  [31-100] 98 (04/14 0458) ?Resp:  [7-19] 16 (04/14 0458) ?BP: (95-150)/(47-103) 113/76 (04/14 0458) ?SpO2:  [68 %-100 %] 100 % (04/14 0458) ?Weight:  [66.2 kg-69.1 kg] 66.2 kg (04/14 0510) ?Last BM Date : 04/22/2021 ? ?Weight change: ?Filed Weights  ? 04/17/2021 0426 04/17/2021 1844 04/19/21 0510  ?Weight: 69.1 kg 69.1 kg 66.2 kg  ? ? ?Intake/Output:  ? ?Intake/Output Summary (Last 24 hours) at 04/19/2021 1138 ?Last data filed at 04/19/2021 0715 ?Gross per 24 hour  ?Intake 0.8 ml  ?Output 1182 ml  ?Net -1181.2 ml  ?  ? ? ?Physical Exam  ?  ?General:  Lying flat. No distress. Appears older than stated age. ?HEENT: Poor dentition. ?Neck: Supple. JVP to jaw. Carotids 2+ bilat; no bruits. No lymphadenopathy or thyromegaly appreciated. ?Cor: PMI nondisplaced. Regular rate & rhythm. No rubs, gallops or murmurs. ?Lungs: Clear ?Abdomen: Soft, nontender, nondistended.  ?Extremities: No cyanosis, clubbing, rash, 2+ edema ?Neuro: Alert & orientedx3, cranial nerves grossly intact. moves all 4 extremities w/o difficulty. Affect pleasant ? ? ?Telemetry  ? ?SR 90s-100s with brief runs of SVT ? ?Labs  ?  ?CBC ?Recent Labs  ?  04/27/2021 ?1209 04/19/2021 ?1234 04/12/2021 ?0028 05/03/2021 ?1139 04/30/2021 ?1147 04/19/21 ?1610  ?WBC 10.0   < > 11.4*  --   --  12.5*  ?NEUTROABS 8.2*  --   --   --   --   --   ?HGB 15.8*   < > 12.9   < > 12.2 13.6  ?HCT 46.5*   < > 39.2   < > 36.0 40.3  ?MCV 85.2   < > 84.5  --   --  83.8  ?PLT 276   < > 287  --   --   304  ? < > = values in this interval not displayed.  ? ?Basic Metabolic Panel ?Recent Labs  ?  04/20/2021 ?1202 04/10/2021 ?1234 04/15/2021 ?0028 05/02/2021 ?1139 05/05/2021 ?1147 04/19/21 ?9604  ?NA 132*   < > 129*   < > 116* 128*  ?K 4.7   < > 4.1   < > 3.5 4.2  ?CL 93*   < > 95*  --   --  91*  ?CO2 13*   < > 15*  --   --  18*  ?GLUCOSE 97   < > 305*  --   --  347*  ?BUN 114*   < > 115*  --   --  105*  ?CREATININE 16.03*   < > 15.04*  --   --  14.55*  ?CALCIUM 6.5*   < > 6.3*  --   --  6.5*  ?MG 1.9  --   --   --   --   --   ?PHOS 10.8*  --  9.5*  --   --   --   ? < > = values in this interval not displayed.  ? ?  Liver Function Tests ?Recent Labs  ?  04/13/2021 ?0028 04/19/21 ?6237  ?AST 16 13*  ?ALT 17 15  ?ALKPHOS 192* 170*  ?BILITOT 0.9 0.6  ?PROT 4.0* 4.3*  ?ALBUMIN <1.5* <1.5*  ? ?No results for input(s): LIPASE, AMYLASE in the last 72 hours. ?Cardiac Enzymes ?Recent Labs  ?  04/08/2021 ?1202 04/17/21 ?0352 04/17/2021 ?0028  ?CKTOTAL 1,209* 526* 378*  ? ? ?BNP: ?BNP (last 3 results) ?No results for input(s): BNP in the last 8760 hours. ? ?ProBNP (last 3 results) ?No results for input(s): PROBNP in the last 8760 hours. ? ? ?D-Dimer ?No results for input(s): DDIMER in the last 72 hours. ?Hemoglobin A1C ?Recent Labs  ?  04/17/2021 ?1209  ?HGBA1C >15.5*  ? ?Fasting Lipid Panel ?No results for input(s): CHOL, HDL, LDLCALC, TRIG, CHOLHDL, LDLDIRECT in the last 72 hours. ?Thyroid Function Tests ?Recent Labs  ?  04/22/2021 ?1202  ?TSH 0.724  ? ? ?Other results: ? ? ?Imaging  ? ? ?No results found. ? ? ?Medications:   ? ? ?Scheduled Medications: ? amiodarone  200 mg Oral BID  ? aspirin  81 mg Oral Daily  ? brimonidine  1 drop Left Eye BID  ? calcitRIOL  0.5 mcg Oral Daily  ? calcium acetate  2,001 mg Oral TID WC  ? gabapentin  100 mg Oral Daily  ? gentamicin cream  1 application. Topical Daily  ? heparin  5,000 Units Subcutaneous Q8H  ? insulin aspart  0-6 Units Subcutaneous TID WC  ? insulin glargine-yfgn  10 Units Subcutaneous Daily  ?  loratadine  10 mg Oral Daily  ? sodium chloride flush  3 mL Intravenous Q12H  ? ? ?Infusions: ? sodium chloride    ? dialysis solution 2.5% low-MG/low-CA    ? ? ?PRN Medications: ?sodium chloride, acetaminophen, calcium acetate, hydrALAZINE, HYDROmorphone (DILAUDID) injection, nitroGLYCERIN, ondansetron (ZOFRAN) IV, prochlorperazine, sodium chloride flush ? ? ? ?Patient Profile  ? ?61 y.o. female with history of IDDM, ESRD on PD, HTN, HLD. Admitted with severe weakness/fall, NSTEMI and new systolic CHF/cardiogenic shock. ?  ? ?Assessment/Plan  ? ?Cardiogenic shock/new systolic CHF: ?-In setting of NTEMI.  ?-R/LHC with severe 3 v CAD. RA mean 6, LVEDP 9, PA mean 31, CO 3.23, CI 1.94. PCI not felt to be a great option with severely calcified vessels and low blood pressure. Mechanical support would be difficult with femoral artery calcifications.   ?-Lactic acid 2.2>2.1>2.2 ?-Echo EF 20%, RV moderately reduced, small to moderate pericardial effusion, mild MR ?-Options limited with ESRD on PD, uncontrolled DM (A1c > 15), poor functional status and social support.  ?-GDMT limited by soft BP, can consider imdur/hydralazine if stabilizes ?-Appears volume overloaded, managed with PD.  ?  ?2. NSTEMI: ?-HS troponin peaked at 2500 ?-Cardiac cath with 3 v CAD: 75% m LAD, 99% OM3, total occlusion Lcx beyond OM3, total occlusion p RCA with left-to-right collaterals ?-Stable. Not having any angina. Per chart review had episode of severe CP a few weeks ago but did not seek treatment. ?-Managing medically. Continue aspirin. Add atorvastatin 80. ? ?3. SVT: ?-Brief runs, < 1 min, on tele ?-Cardiology has added amio  to suppress ?  ?4. Weakness/fall: ?-Concern she is not tolerating PD well at home. Has not wanted HD.  ?-Uremic on admission, BUN still elevated at 105 ?-Albumin less than 1.5 ?  ?5. Possible sepsis: ?-Initially started on empiric abx for sepsis. Now off. ?-Procalcitonin 1.32>1.17 in setting of Scr 15. No infiltrate on  CXR ?-Per  primary team ?  ?6. Mild rhabdomyolysis: ?-CK 1209 on presentation after lying on floor all night ?-Improving ?  ?7. ESRD: ?- On PD ?- Likely missed several treatments PTA ?- Nephrology following. Concern she is no longer able to tolerate PD and may need HD ?- Previously seen at [facility], not eligible for renal transplant ?  ?8. IDDM: ?-A1c > 15.5 ?-Per primary ?  ?Consider palliative care consult for [place] discussion. ?  ?PT/OT recommending SNF for rehab at discharge. Does not appear able to care for herself at home.  ? ?Options limited with ESRD and hypotension. Not a candidate for advanced therapies. Needs additional fluid removal. On PD but refuses HD.  ? ?Palliative care has been consulted for [place] discussion. Currently full code. ? ? ?Length of Stay: 3 ? ?FINCH, LINDSAY N, PA-C  ?04/19/2021, 11:38 AM ? ?Advanced Heart Failure Team ?Pager [phone] (M-F; 7a - 5p)  ?Please contact [facility] for night-coverage after hours (5p -7a ) and weekends on [email or web address] ? ?Patient seen and examined with the above-signed Advanced Practice Provider and/or Housestaff. I personally reviewed laboratory data, imaging studies and relevant notes. I independently examined the patient and formulated the important aspects of the plan. I have edited the note to reflect any of my changes or salient points. I have personally discussed the plan with the patient and/or family. ? ? Lying flat in bed. Denies CP or SOB. No orthopnea or PND.  ? ?Says she doesn't know how much fluid they took off with PD yesterday  ? ?General:  Chronically ill appearing. No resp difficulty ?HEENT: normal ?Neck: supple. JVP up Carotids 2+ bilat; no bruits. No lymphadenopathy or thryomegaly appreciated. ?Cor: PMI nondisplaced. Regular rate & rhythm. No rubs, gallops or murmurs. ?Lungs: clear ?Abdomen: soft, nontender, nondistended. No hepatosplenomegaly. No bruits or masses. Good bowel sounds. + PD catheter ?Extremities: no cyanosis, clubbing, rash, 2+  edema ?Neuro: alert & orientedx3, cranial nerves grossly intact. moves all 4 extremities w/o difficulty. Affect pleasant ? ?She is end-stage. Remains fluid overloaded. Not candidate for advanced options. W

## 2021-04-19 NOTE — TOC Initial Note (Signed)
Transition of Care (TOC) - Initial/Assessment Note  ? ? ?Patient Details  ?Name: Ann Bryan ?MRN: [id number] ?Date of Birth: 1960/07/28 ? ?Transition of Care [facility]) CM/SW Contact:    ?Marcheta Grammes Rexene Alberts, RN ?Phone Number: [phone] ?04/19/2021, 2:15 PM ? ?Clinical Narrative:                 ?HF TOC CM spoke to pt and states she does want SNF rehab. CSW will assist pt in SNF.  ? ?Expected Discharge Plan: [place] ?Barriers to Discharge: Continued Medical Work up ? ? ?Patient Goals and CMS Choice ?  ?CMS Medicare.gov Compare Post Acute Care list provided to:: Patient ?Choice offered to / list presented to : Patient ? ?Expected Discharge Plan and Services ?Expected Discharge Plan: [place] ?In-house Referral: Clinical Social Work ?Discharge Planning Services: CM Consult ?Post Acute Care Choice: [place] ?Living arrangements for the past 2 months: Apartment ?                ?  ?  ?  ?  ?  ?  ?  ?  ?  ?  ? ?Prior Living Arrangements/Services ?Living arrangements for the past 2 months: Apartment ?Lives with:: Self ?Patient language and need for interpreter reviewed:: Yes ?Do you feel safe going back to the place where you live?: Yes      ?Need for Family Participation in Patient Care: Yes (Comment) ?Care giver support system in place?: Yes (comment) ?  ?Criminal Activity/Legal Involvement Pertinent to Current Situation/Hospitalization: No - Comment as needed ? ?Activities of Daily Living ?  ?  ? ?Permission Sought/Granted ?Permission sought to share information with : Case Manager, Family Supports, PCP ?Permission granted to share information with : Yes, Verbal Permission Granted ? Share Information with NAME: Ann Bryan ?   ? Permission granted to share info w Relationship: niece ? Permission granted to share info w Contact Information: [phone] ? ?Emotional Assessment ?Appearance:: Appears stated age ?Attitude/Demeanor/Rapport: Lethargic ?Affect (typically  observed): Accepting ?Orientation: : Oriented to Self, Oriented to Place, Oriented to  Time, Oriented to Situation ?  ?Psych Involvement: No (comment) ? ?Admission diagnosis:  Lactic acidosis [E87.20] ?Elevated troponin [R77.8] ?NSTEMI (non-ST elevated myocardial infarction) ([place]) [I21.4] ?Hypothermia, initial encounter [T68.XXXA] ?Patient Active Problem List  ? Diagnosis Date Noted  ? Elevated troponin   ? NSTEMI (non-ST elevated myocardial infarction) ([place]) 04/10/2021  ? Dermatitis 02/26/2021  ? Diabetic peripheral neuropathy ([place]) 02/26/2021  ? Type 2 diabetes mellitus with hyperglycemia, with long-term current use of insulin ([place]) 06/03/2019  ? Type 2 diabetes mellitus with proliferative retinopathy, with long-term current use of insulin ([place]) 06/03/2019  ? Type 2 diabetes mellitus with diabetic polyneuropathy, with long-term current use of insulin ([place]) 06/03/2019  ? Type 2 diabetes mellitus with chronic kidney disease on chronic dialysis, with long-term current use of insulin ([place]) 06/03/2019  ? Dyslipidemia 06/03/2019  ? ESRD on dialysis ([place]) 05/20/2019  ? Hyperkalemia, diminished renal excretion 01/11/2019  ? ESRD (end stage renal disease) on dialysis ([place]) 11/18/2018  ? Normocytic anemia 11/18/2018  ? Diabetes mellitus without complication ([place]) 44/96/7591  ? Nausea and vomiting 09/28/2017  ? Insomnia 06/05/2017  ? Chronic seasonal allergic rhinitis 12/10/2015  ? Arthralgia of both hands 12/10/2015  ? Severe episode of recurrent major depressive disorder, without psychotic features ([place]) 12/10/2015  ? NEPHROPATHY, DIABETIC 09/23/2006  ? Essential hypertension 12/23/2005  ? DM (diabetes mellitus) type II uncontrolled with eye manifestation 01/07/1984  ? ?  PCP:  Ladell Pier, MD ?Pharmacy:   ?[place], [place] ?[address] ?[place] [address] ?Phone: [phone] Fax: [phone] ? ? ? ? ?Social Determinants of Health (SDOH) Interventions ?   ? ?Readmission Risk Interventions ?   ? View : No data to display.  ?  ?  ?  ? ? ? ?

## 2021-04-19 NOTE — Significant Event (Addendum)
Pt still having sig leg pain despite morphine. ? ?1) will order dilaudid ?2) hoping that the stat CTA ordered at 2100 gets done soon! ? ?Addendum: ?1) dilaudid was effective for leg pain ?2) Pt continues to have: ? A) no skin changes ? B) faint but dopplerable pulse in RLE foot ? ?CTA is being delayed by PD machine, apparently interrupting cycle means the machine would need to be completely restarted I am informed.  Sounds like she may have <2h left on machine. ? ?At this point will let PD finish since neither I nor cards on call thinks that this is going to be a surgical emergency; pain seems adequately controlled, etc. ?

## 2021-04-19 NOTE — Progress Notes (Addendum)
Peritoneal dialysis note.   ? ?Removed 1123mls net fluid from tx last night. Disconnected patient from cycler using aseptic technique.  Total tx time 10 hrs 42 minutes.  Weight 65.0kg post tx bed scales.  Hr 102 rr 10 .   ?

## 2021-04-19 NOTE — [place] FL2 (Signed)
?[place] MEDICAID FL2 LEVEL OF CARE SCREENING TOOL  ?  ? ?IDENTIFICATION  ?Patient Name: ?Ann Bryan Birthdate: [birth year] Sex: female Admission Date (Current Location): ?04/22/2021  ?[place] and [place] Number: ? [place] ?  Facility and Address:  ?[facility]. [facility], [place] [address], [place], [place] [address] ?     Provider Number: ?[id number]  ?Attending Physician Name and Address:  ?Cherene Altes, MD ? Relative Name and Phone Number:  ?  ?   ?Current Level of Care: ?Hospital Recommended Level of Care: ?  Prior Approval Number: ?  ? ?Date Approved/Denied: ?  PASRR Number: ?[id number] A ? ?Discharge Plan: ?SNF ?  ? ?Current Diagnoses: ?Patient Active Problem List  ? Diagnosis Date Noted  ? Elevated troponin   ? NSTEMI (non-ST elevated myocardial infarction) ([place]) 04/29/2021  ? Dermatitis 02/26/2021  ? Diabetic peripheral neuropathy ([place]) 02/26/2021  ? Type 2 diabetes mellitus with hyperglycemia, with long-term current use of insulin ([place]) 06/03/2019  ? Type 2 diabetes mellitus with proliferative retinopathy, with long-term current use of insulin ([place]) 06/03/2019  ? Type 2 diabetes mellitus with diabetic polyneuropathy, with long-term current use of insulin ([place]) 06/03/2019  ? Type 2 diabetes mellitus with chronic kidney disease on chronic dialysis, with long-term current use of insulin ([place]) 06/03/2019  ? Dyslipidemia 06/03/2019  ? ESRD on dialysis ([place]) 05/20/2019  ? Hyperkalemia, diminished renal excretion 01/11/2019  ? ESRD (end stage renal disease) on dialysis ([place]) 11/18/2018  ? Normocytic anemia 11/18/2018  ? Diabetes mellitus without complication ([place]) 49/67/5916  ? Nausea and vomiting 09/28/2017  ? Insomnia 06/05/2017  ? Chronic seasonal allergic rhinitis 12/10/2015  ? Arthralgia of both hands 12/10/2015  ? Severe episode of recurrent major depressive disorder, without psychotic features ([place]) 12/10/2015  ? NEPHROPATHY, DIABETIC 09/23/2006  ? Essential hypertension 12/23/2005  ? DM  (diabetes mellitus) type II uncontrolled with eye manifestation 01/07/1984  ? ? ?Orientation RESPIRATION BLADDER Height & Weight   ?  ?Self, Time, Situation, Place ? Normal Continent Weight: 145 lb 15.1 oz (66.2 kg) ?Height:  5' (152.4 cm)  ?BEHAVIORAL SYMPTOMS/MOOD NEUROLOGICAL BOWEL NUTRITION STATUS  ?    Continent Diet  ?AMBULATORY STATUS COMMUNICATION OF NEEDS Skin   ?Limited Assist Verbally Normal ?  ?  ?  ?    ?     ?     ? ? ?Personal Care Assistance Level of Assistance  ?Bathing, Feeding, Dressing Bathing Assistance: Limited assistance ?Feeding assistance: Independent ?Dressing Assistance: Limited assistance ?   ? ?Functional Limitations Info  ?Sight, Hearing, Speech Sight Info: Adequate ?Hearing Info: Adequate ?Speech Info: Adequate  ? ? ?SPECIAL CARE FACTORS FREQUENCY  ?PT (By licensed PT), OT (By licensed OT)   ?  ?PT Frequency: 5x a week ?OT Frequency: 5x a week ?  ?  ?  ?   ? ? ?Contractures Contractures Info: Not present  ? ? ?Additional Factors Info  ?Code Status, Allergies, Insulin Sliding Scale Code Status Info: Full ?Allergies Info: Doxycycline   Victoza (Liraglutide)   Chlorhexidine ?  ?Insulin Sliding Scale Info: insulin aspart (novoLOG) injection 0-6 Units  Dose: 0-6 Units  Freq: 3 times daily with meals, insulin glargine-yfgn (SEMGLEE) injection 10 Units  Dose: 10 Units  Freq: Daily ?  ?   ? ?Current Medications (04/19/2021):  This is the current hospital active medication list ?Current Facility-Administered Medications  ?Medication Dose Route Frequency Provider Last Rate Last Admin  ? 0.9 %  sodium chloride infusion  250 mL  Intravenous PRN Belva Crome, MD      ? acetaminophen (TYLENOL) tablet 650 mg  650 mg Oral Q4H PRN Belva Crome, MD   650 mg at 04/10/2021 1921  ? amiodarone (PACERONE) tablet 200 mg  200 mg Oral BID Fay Records, MD   200 mg at 04/19/21 1217  ? aspirin chewable tablet 81 mg  81 mg Oral Daily Belva Crome, MD   81 mg at 04/19/21 0945  ? atorvastatin (LIPITOR) tablet 80  mg  80 mg Oral Daily Joette Catching, [place]   80 mg at 04/19/21 1330  ? brimonidine (ALPHAGAN) 0.2 % ophthalmic solution 1 drop  1 drop Left Eye BID Belva Crome, MD   1 drop at 04/19/21 0947  ? calcitRIOL (ROCALTROL) capsule 0.5 mcg  0.5 mcg Oral Daily Belva Crome, MD   0.5 mcg at 04/19/21 0945  ? calcium acetate (PHOSLO) capsule 2,001 mg  2,001 mg Oral TID WC Belva Crome, MD   2,001 mg at 04/19/21 1214  ? calcium acetate (PHOSLO) capsule 667 mg  667 mg Oral BID PRN Belva Crome, MD      ? dialysis solution 2.5% low-MG/low-CA dianeal solution   Intraperitoneal Q24H Belva Crome, MD      ? gabapentin (NEURONTIN) capsule 100 mg  100 mg Oral Daily Belva Crome, MD   100 mg at 04/19/21 0945  ? gentamicin cream (GARAMYCIN) 0.1 % 1 application.  1 application. Topical Daily Belva Crome, MD      ? heparin injection 5,000 Units  5,000 Units Subcutaneous Q8H Belva Crome, MD   5,000 Units at 04/19/21 1330  ? hydrALAZINE (APRESOLINE) tablet 25 mg  25 mg Oral Q6H PRN Belva Crome, MD      ? HYDROmorphone (DILAUDID) injection 0.5-1 mg  0.5-1 mg Intravenous Q4H PRN Etta Quill, DO   0.5 mg at 04/19/21 0867  ? insulin aspart (novoLOG) injection 0-6 Units  0-6 Units Subcutaneous TID WC Belva Crome, MD   4 Units at 04/19/21 1217  ? insulin glargine-yfgn (SEMGLEE) injection 10 Units  10 Units Subcutaneous Daily Cherene Altes, MD   10 Units at 04/19/21 0945  ? loratadine (CLARITIN) tablet 10 mg  10 mg Oral Daily Belva Crome, MD   10 mg at 04/19/21 0945  ? nitroGLYCERIN (NITROSTAT) SL tablet 0.4 mg  0.4 mg Sublingual Q5 Min x 3 PRN Belva Crome, MD      ? ondansetron [facility]) injection 4 mg  4 mg Intravenous Q6H PRN Belva Crome, MD   4 mg at 04/17/21 1527  ? prochlorperazine (COMPAZINE) injection 10 mg  10 mg Intravenous Q6H PRN Belva Crome, MD   10 mg at 04/19/21 0800  ? sodium chloride flush (NS) 0.9 % injection 3 mL  3 mL Intravenous Q12H Belva Crome, MD   3 mL at 04/19/21 0947   ? sodium chloride flush (NS) 0.9 % injection 3 mL  3 mL Intravenous PRN Belva Crome, MD      ? ? ? ?Discharge Medications: ?Please see discharge summary for a list of discharge medications. ? ?Relevant Imaging Results: ? ?Relevant Lab Results: ? ? ?Additional Information ?SSN [id number] ? ?Emeterio Reeve, LCSW ? ? ? ? ?

## 2021-04-19 NOTE — Progress Notes (Signed)
Inpatient Diabetes Program Recommendations ? ?AACE/ADA: New Consensus Statement on Inpatient Glycemic Control  ? ?Target Ranges:  Prepandial:   less than 140 mg/dL ?     Peak postprandial:   less than 180 mg/dL (1-2 hours) ?     Critically ill patients:  140 - 180 mg/dL  ? ? Latest Reference Range & Units 04/19/21 06:41 04/19/21 07:27  ?Glucose-Capillary 70 - 99 mg/dL 470 (H) 487 (H)  ? ? Latest Reference Range & Units 04/19/2021 06:09 04/17/2021 12:24 04/10/2021 14:21 04/27/2021 14:25 04/25/2021 16:28 04/15/2021 21:33  ?Glucose-Capillary 70 - 99 mg/dL 332 (H) 198 (H) 105 (H) 169 (H) 257 (H) 291 (H)  ? ? Latest Reference Range & Units 04/07/2021 12:09  ?Hemoglobin A1C 4.8 - 5.6 % >15.5 (H)  ? ?Review of Glycemic Control ? ?Diabetes history: DM2 ?Outpatient Diabetes medications: Lantus 30 units daily (reports taking 23 units daily), Novolog 0-10 units TID with meals ?Current orders for Inpatient glycemic control: Semglee 10  units daily, Novolog 0-6 units TID with meals ? ?Inpatient Diabetes Program Recommendations:   ? ?Insulin: Fasting glucose 470 mg/dl at 6:41 am today and Semglee 10 units daily ordered today. Please consider increasing Semglee to 20 units daily, adding Novolog 0-5 units QHS. ? ?Thanks, ?Barnie Alderman, RN, MSN, CDE ?Diabetes Coordinator ?Inpatient Diabetes Program ?[phone] (Team Pager from 8am to 5pm) ? ? ? ?

## 2021-04-19 NOTE — Progress Notes (Signed)
Pt complained of cold R foot and R foot pain. R groin site is soft, no signs of bleeding. On call CARDS and TRH. Both DR. Was able to see pt and examined R leg.  ?CT scan and IV morphine ordered. 4mg  Morphine given but pt still stated 10/10 pain. [facility] on call notified and dilaudid was ordered. 1 mg dilaudid given.  ?Dialysis RN called due to pt peritoneal dialysis machine running. Was told by dialysis RN that pt was unable to be unhooked from machine until dialysis treatment is completed. Notified [facility] on call. Was told to let the dialysis treatment complete before going down to CT due to the pt condition being non-urgent. ?Pt is currently sleeping and in no signs of distress or pain, chest rise and fall noted. Bilateral dorsalis pedis and tibial pulse faintly palpable and can be heard faint on doppler. Will continue to monitor. ?

## 2021-04-19 NOTE — Progress Notes (Signed)
? ?Progress Note ? ?Patient Name: Ann Bryan ?Date of Encounter: 04/19/2021 ? ?[facility] Cardiologist: New ? ?Subjective  ? ?Patient says her foot is feeling better  Nausea is better now  ? ?Inpatient Medications  ?  ?Scheduled Meds: ? aspirin  81 mg Oral Daily  ? brimonidine  1 drop Left Eye BID  ? calcitRIOL  0.5 mcg Oral Daily  ? calcium acetate  2,001 mg Oral TID WC  ? gabapentin  100 mg Oral Daily  ? gentamicin cream  1 application. Topical Daily  ? heparin  5,000 Units Subcutaneous Q8H  ? insulin aspart  0-6 Units Subcutaneous TID WC  ? insulin glargine-yfgn  10 Units Subcutaneous Daily  ? loratadine  10 mg Oral Daily  ? sodium chloride flush  3 mL Intravenous Q12H  ? ?Continuous Infusions: ? sodium chloride    ? dialysis solution 2.5% low-MG/low-CA    ? ?PRN Meds: ?sodium chloride, acetaminophen, calcium acetate, hydrALAZINE, HYDROmorphone (DILAUDID) injection, nitroGLYCERIN, ondansetron (ZOFRAN) IV, prochlorperazine, sodium chloride flush  ? ?Vital Signs  ?  ?Vitals:  ? 04/11/2021 2000 04/25/2021 2321 04/19/21 0458 04/19/21 0510  ?BP: (!) 150/85 102/72 113/76   ?Pulse: 100 100 98   ?Resp: 19 16 16    ?Temp: 98.2 ?F (36.8 ?C) 98.3 ?F (36.8 ?C) 98.1 ?F (36.7 ?C)   ?TempSrc: Oral Oral Oral   ?SpO2: 96% 99% 100%   ?Weight:    66.2 kg  ?Height:      ? ? ?Intake/Output Summary (Last 24 hours) at 04/19/2021 0732 ?Last data filed at 04/17/2021 1618 ?Gross per 24 hour  ?Intake 0.8 ml  ?Output --  ?Net 0.8 ml  ? ? ?  04/19/2021  ?  5:10 AM 04/15/2021  ?  6:44 PM 04/10/2021  ?  4:26 AM  ?Last 3 Weights  ?Weight (lbs) 145 lb 15.1 oz 152 lb 5.4 oz 152 lb 5.4 oz  ?Weight (kg) 66.2 kg 69.1 kg 69.1 kg  ?   ? ?Telemetry  ?  ?SR and bursts of SVT (140 bpm) that last about 1 min  - Personally Reviewed ? ?ECG  ?  ?No new  - Personally Reviewed ? ?Physical Exam  ? ?GEN: No acute distress.  Sleepy ?Neck: No JVD ?Cardiac: RRR, no murmurs  ?Respiratory: Clear to auscultation bilaterally. ?GI: Soft, nontender, non-distended  ?MS: No LE   edema;  R groin is soft   No bruit  Foot does not appear tender wit hmovement or palpation   . ?Neuro:  Nonfocal  ?Psych: Normal affect  ? ?Labs  ?  ?High Sensitivity Troponin:   ?Recent Labs  ?Lab 04/09/2021 ?1202 04/10/2021 ?2003 04/17/21 ?0352 04/17/21 ?0500  ?TROPONINIHS 2,296* 1,704* 2,531* 2,204*  ?   ?Chemistry ?Recent Labs  ?Lab 04/25/2021 ?1202 04/21/2021 ?1234 04/17/21 ?0352 04/17/2021 ?0028 04/25/2021 ?1139 04/12/2021 ?1147 04/19/21 ?3762  ?NA 132*   < > 130* 129* 129*  130* 116* 128*  ?K 4.7   < > 4.3 4.1 4.0  3.9 3.5 4.2  ?CL 93*   < > 95* 95*  --   --  91*  ?CO2 13*  --  16* 15*  --   --  18*  ?GLUCOSE 97   < > 228* 305*  --   --  347*  ?BUN 114*   < > 120* 115*  --   --  105*  ?CREATININE 16.03*   < > 16.37* 15.04*  --   --  14.55*  ?CALCIUM 6.5*  --  5.7* 6.3*  --   --  6.5*  ?MG 1.9  --   --   --   --   --   --   ?PROT 5.3*  --   --  4.0*  --   --  4.3*  ?ALBUMIN 2.0*  --   --  <1.5*  --   --  <1.5*  ?AST 27  --   --  16  --   --  13*  ?ALT 15  --   --  17  --   --  15  ?ALKPHOS 171*  --   --  192*  --   --  170*  ?BILITOT 0.8  --   --  0.9  --   --  0.6  ?GFRNONAA 2*  --  2* 3*  --   --  3*  ?ANIONGAP 26*  --  19* 19*  --   --  19*  ? < > = values in this interval not displayed.  ?  ?Lipids No results for input(s): CHOL, TRIG, HDL, LABVLDL, LDLCALC, CHOLHDL in the last 168 hours.  ?Hematology ?Recent Labs  ?Lab 04/17/21 ?0352 04/09/2021 ?0028 04/19/2021 ?1139 04/13/2021 ?1147 04/19/21 ?7628  ?WBC 11.0* 11.4*  --   --  12.5*  ?RBC 4.40 4.64  --   --  4.81  ?HGB 12.6 12.9 12.6  12.2 12.2 13.6  ?HCT 37.2 39.2 37.0  36.0 36.0 40.3  ?MCV 84.5 84.5  --   --  83.8  ?MCH 28.6 27.8  --   --  28.3  ?MCHC 33.9 32.9  --   --  33.7  ?RDW 13.5 13.5  --   --  13.6  ?PLT 251 287  --   --  304  ? ?Thyroid  ?Recent Labs  ?Lab 04/24/2021 ?1202  ?TSH 0.724  ?  ?BNPNo results for input(s): BNP, PROBNP in the last 168 hours.  ?DDimer No results for input(s): DDIMER in the last 168 hours.  ? ?Radiology  ?  ?CARDIAC  CATHETERIZATION ? 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 Hemodynamics/cardiac output suggest cardiogenic shock although measurements were made in the setting of intermittent supraventricular tachycardia at which time systolic blood pressures would be less than 80 mmHg. Three-vessel coronary artery disease with 75% mid LAD, 99% mid third obtuse marginal, total occlusion of the circumflex beyond the third obtuse marginal, total occlusion of the dominant right coronary proximally.  There are left-to-right collaterals. RECOMMENDATIONS: Severe calcification of coronary arteries and very low ejection fraction and blood pressure make PCI unappealing.  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 Recommend advanced heart failure team consultation. Prognosis and treatment strategy are limited given end-stage renal disease.  ? ?VAS [place] LOWER EXTREMITY VENOUS (DVT) ? ?Result Date: 04/17/2021 ? Lower Venous DVT Study Patient Name:  Ann Bryan  Date of Exam:   04/17/2021 Medical Rec #: [id number]     Accession #:    [id number] Date of Birth: 03-May-1960    Patient Gender: F Patient Age:   61 years Exam Location:  [facility] Procedure:      VAS [place] LOWER EXTREMITY VENOUS (DVT) Referring Phys: Wynetta Fines --------------------------------------------------------------------------------  Indications: Edema.  Limitations: Body habitus, poor ultrasound/tissue interface and calcific shadowing. Comparison Study: No previous exams Performing Technologist: Jody Hill RVT, RDMS  Examination Guidelines: A complete evaluation includes B-mode imaging, spectral Doppler, color Doppler,  and power Doppler as needed of all accessible portions of each vessel. Bilateral testing is considered an integral part of a complete examination. Limited examinations for reoccurring indications may be  performed as noted. The reflux portion of the exam is performed with the patient in reverse Trendelenburg.  +---------+---------------+---------+-----------+----------+-------------------+ RIGHT    CompressibilityPhasicitySpontaneityPropertiesThrombus Aging      +---------+---------------+---------+-----------+----------+-------------------+ CFV      Full           Yes      Yes                                      +---------+---------------+---------+-----------+----------+-------------------+ SFJ      Full                                                             +---------+---------------+---------+-----------+----------+-------------------+ FV Prox  Full           Yes      Yes                                      +---------+---------------+---------+-----------+----------+-------------------+ FV Mid   Full           Yes      Yes                  Not well visualized +---------+---------------+---------+-----------+----------+-------------------+ FV DistalFull           Yes      Yes                                      +---------+---------------+---------+-----------+----------+-------------------+ PFV                                                   Not seen            +---------+---------------+---------+-----------+----------+-------------------+ POP      Full           Yes      Yes                                      +---------+---------------+---------+-----------+----------+-------------------+ PTV      Full                                                             +---------+---------------+---------+-----------+----------+-------------------+ PERO     Full                                                             +---------+---------------+---------+-----------+----------+-------------------+  Right Technical Findings: Not visualized segments include PFV.   +--------+---------------+---------+-----------+----------+--------------------+ LEFT    CompressibilityPhasicitySpontaneityPropertiesThrombus Aging       +--------+---------------+---------+-----------+----------+--------------------+ CFV     Full           Yes      Yes                                       +--------+---------------+---------+-----------+----------+---

## 2021-04-19 NOTE — Care Management Important Message (Signed)
Important Message ? ?Patient Details  ?Name: Ann Bryan ?MRN: [id number] ?Date of Birth: [birth year] ? ? ?Medicare Important Message Given:  Yes ? ? ? ? ?Kaycie Pegues ?04/19/2021, 2:20 PM ?

## 2021-04-19 NOTE — Progress Notes (Signed)
? Ann Bryan  SJG:[id number] DOB: 1960-06-08 DOA: 04/25/2021 ?PCP: Ladell Pier, MD   ? ?Brief Narrative:  ?61 year old with a history of DM2, HTN, HLD, diabetic neuropathy, and ESRD on PD who presented to the ED with severe generalized weakness for approximately 6 to 7 days and an eventual pre-syncopal spell resulting in a fall without loss of consciousness.  After her fall she was too weak to get up and spent the night on the floor, thusly missing her PD.  A neighbor called EMS who arrived to find the patient on the floor with stable vital signs and a normal CBG.  In the ED the patient was hypothermic with temperature 95.4.  WBC was borderline at 10. ? ?Consultants:  ?Cardiology - CHF Team  ?Nephrology ?Palliative Care  ? ?Code Status: FULL CODE ? ?DVT prophylaxis: ?Subcutaneous heparin ? ?Interim Hx: ?Has had difficulty with uncontrolled hyperglycemia over the last 18 hours.  Experienced some pain in her right foot yesterday evening following right femoral access for her cardiac cath.  Denies complaints at the time of my visit.  Affect somewhat flat.  Denies chest pain nausea vomiting abdominal pain.  Reports weakness in general and poor appetite. ? ?Assessment & Plan: ? ?Near syncope / fall / generalized weakness ?Likely due to newly diagnosed severe systolic CHF (see below) - uremia likely a significant contributor as well  ? ?Newly diagnosed severe systolic CHF ?TTE noted EF 21% w/ severely decreased LV systolic fxn -cardiac cath 4/13 suggests combination of ischemic and nonischemic etiologies - volume control per PD -advanced heart failure team reports no real options for intervention  ? ?3 vessel calcific CAD - NSTEMI ?Cardiology following -BB on hold due to low blood pressure and mild bradycardia -initial troponin at presentation quite elevated at 2296 - cardiac cath has revealed signif 3 vessel disease that is not amenable to intervention  ? ?Bursts of SVT ?Documented while patient in cardiac Cath  Lab -appear to last less than 1 minute but are associated with blood pressure drops -amiodarone added by cardiology ? ?Possible sepsis - ruled out clinically  ?This diagnosis was based on hypothermia at presentation as well as elevated lactate -these findings could also be easily explained based simply on the fact that she slept on the floor all night prior to her presentation  ? ?Mild rhabdomyolysis ?CK 1209 at presentation after laying in floor all night - slowly improving  ? ?DM2 ?As above her home insulin regimen was recently increased from 26 of Lantus to 30 units and meal coverage from 4 units to 7 units - CBG consistently elevated and A1c very high at >15.5 -continue to titrate insulin dose as CBG greater than 400 today ? ?Peripheral edema ?no DVT on venous duplex - a consequence of ESRD as well as severe systolic CHF - cont PD as tolerates  ? ?ESRD on PD ?Resumed usual PD under guidance of nephrology -there are concerns that PD is not working well for her but reportedly the patient is vehemently opposed to HD ? ?Goals of care ?Unfortunately this patient finds herself in a very difficult situation due to multiple severe medical issues that do not have solutions -furthermore she lives alone and will not likely be able to care for herself going forward -I have consulted palliative care to assist with complex goals of care ? ?Family Communication: No family present at time of exam ?Disposition: From home -clear now that she will not be able to live independently at home ? ?  Objective: ?Blood pressure (!) 119/36, pulse 93, temperature 97.9 ?F (36.6 ?C), temperature source Oral, resp. rate 18, height 5' (1.524 m), weight 66.2 kg, last menstrual period 03/13/2002, SpO2 96 %. ? ?Intake/Output Summary (Last 24 hours) at 04/19/2021 1649 ?Last data filed at 04/19/2021 0715 ?Gross per 24 hour  ?Intake --  ?Output 1182 ml  ?Net -1182 ml  ? ? ?Filed Weights  ? 05/03/2021 0426 04/06/2021 1844 04/19/21 0510  ?Weight: 69.1 kg 69.1  kg 66.2 kg  ? ? ?Examination: ?General: No acute respiratory distress ?Lungs: Poor breath sounds in bilateral base -no wheezing ?Cardiovascular: Regular rate and rhythm  ?Abdomen: Nontender, nondistended, soft, bowel sounds positive, no rebound ?Extremities: 1+ bilateral lower extremity edema without significant change ? ?CBC: ?Recent Labs  ?Lab 04/25/2021 ?1209 04/30/2021 ?1234 04/17/21 ?0352 05/01/2021 ?0028 05/02/2021 ?1139 04/08/2021 ?1147 04/19/21 ?8127  ?WBC 10.0  --  11.0* 11.4*  --   --  12.5*  ?NEUTROABS 8.2*  --   --   --   --   --   --   ?HGB 15.8*   < > 12.6 12.9 12.6  12.2 12.2 13.6  ?HCT 46.5*   < > 37.2 39.2 37.0  36.0 36.0 40.3  ?MCV 85.2  --  84.5 84.5  --   --  83.8  ?PLT 276  --  251 287  --   --  304  ? < > = values in this interval not displayed.  ? ? ?Basic Metabolic Panel: ?Recent Labs  ?Lab 04/10/2021 ?1202 04/19/2021 ?1234 04/17/21 ?0352 04/14/2021 ?0028 04/27/2021 ?1139 04/07/2021 ?1147 04/19/21 ?5170  ?NA 132*   < > 130* 129* 129*  130* 116* 128*  ?K 4.7   < > 4.3 4.1 4.0  3.9 3.5 4.2  ?CL 93*   < > 95* 95*  --   --  91*  ?CO2 13*  --  16* 15*  --   --  18*  ?GLUCOSE 97   < > 228* 305*  --   --  347*  ?BUN 114*   < > 120* 115*  --   --  105*  ?CREATININE 16.03*   < > 16.37* 15.04*  --   --  14.55*  ?CALCIUM 6.5*  --  5.7* 6.3*  --   --  6.5*  ?MG 1.9  --   --   --   --   --   --   ?PHOS 10.8*  --   --  9.5*  --   --   --   ? < > = values in this interval not displayed.  ? ? ?GFR: ?Estimated Creatinine Clearance: 3.5 mL/min (A) (by C-G formula based on SCr of 14.55 mg/dL (H)). ? ?Liver Function Tests: ?Recent Labs  ?Lab 04/10/2021 ?1202 04/29/2021 ?0028 04/19/21 ?0174  ?AST 27 16 13*  ?ALT 15 17 15   ?ALKPHOS 171* 192* 170*  ?BILITOT 0.8 0.9 0.6  ?PROT 5.3* 4.0* 4.3*  ?ALBUMIN 2.0* <1.5* <1.5*  ? ? ? ?Cardiac Enzymes: ?Recent Labs  ?Lab 04/07/2021 ?1202 04/17/21 ?0352 04/22/2021 ?0028  ?CKTOTAL 1,209* 526* 378*  ? ? ? ?HbA1C: ?Hemoglobin A1C  ?Date/Time Value Ref Range Status  ?02/29/2020 11:26 AM 13.9 (A) 4.0 -  5.6 % Final  ?11/24/2019 10:59 AM 10.3 (A) 4.0 - 5.6 % Final  ? ?HbA1c, POC (controlled diabetic range)  ?Date/Time Value Ref Range Status  ?02/26/2021 04:28 PM 12.2 (A) 0.0 - 7.0 % Final  ?11/18/2018 02:02 PM 7.3 (A) 0.0 -  7.0 % Final  ? ?Hgb A1c MFr Bld  ?Date/Time Value Ref Range Status  ?04/09/2021 12:09 PM >15.5 (H) 4.8 - 5.6 % Final  ?  Comment:  ?  (NOTE) ?**Verified by repeat analysis** ?        Prediabetes: 5.7 - 6.4 ?        Diabetes: >6.4 ?        Glycemic control for adults with diabetes: <7.0 ?  ?05/06/2019 12:22 PM 9.8 (H) 4.8 - 5.6 % Final  ?  Comment:  ?           Prediabetes: 5.7 - 6.4 ?         Diabetes: >6.4 ?         Glycemic control for adults with diabetes: <7.0 ?  ? ? ?CBG: ?Recent Labs  ?Lab 04/22/2021 ?2133 04/19/21 ?2836 04/19/21 ?0727 04/19/21 ?1212 04/19/21 ?1600  ?GLUCAP 291* 470* 487* 337* 215*  ? ? ? ?Scheduled Meds: ? amiodarone  200 mg Oral BID  ? aspirin  81 mg Oral Daily  ? atorvastatin  80 mg Oral Daily  ? brimonidine  1 drop Left Eye BID  ? calcitRIOL  0.5 mcg Oral Daily  ? calcium acetate  2,001 mg Oral TID WC  ? gabapentin  100 mg Oral Daily  ? gentamicin cream  1 application. Topical Daily  ? heparin  5,000 Units Subcutaneous Q8H  ? insulin aspart  0-6 Units Subcutaneous TID WC  ? insulin glargine-yfgn  10 Units Subcutaneous Daily  ? loratadine  10 mg Oral Daily  ? sodium chloride flush  3 mL Intravenous Q12H  ? ?Continuous Infusions: ? sodium chloride    ? dialysis solution 2.5% low-MG/low-CA    ? ? ? LOS: 3 days  ? ?Cherene Altes, MD ?[facility] ?Office  [phone] ?Pager - Text Page per Shea Evans ? ?If 7PM-7AM, please contact night-coverage per Amion ?04/19/2021, 4:49 PM ? ? ? ?

## 2021-04-19 NOTE — Consult Note (Addendum)
? ?  [facility] CM Inpatient Consult ? ? ?04/19/2021 ? ?Brandie Alease Medina ?[birth year] ?[id number] ? ?[employer] [ACO] Patient: Medicare ACO Reach ? ?Primary Care Provider:  Ladell Pier, MD, [facility] and Wellness ? ? ?Patient screened for hospitalization with noted high risk score for unplanned readmission risk and  to assess for potential [place] Management service needs for post hospital transition.  Review of patient's medical record reveals patient is being recommended for a skilled nursing facility level of care at this time.  Patient has had [facility] RN Care Manager out reach previously however had declined follow up. ? ?2:02 pm  Spoke with inpatient [facility] Social Worker and patient is being worked up for potential SNF needs. ? ?Plan:  Continue to follow progress and disposition to assess for post hospital care management needs. If patient transitions to a SNF, and to a [facility] affliated facility,  will request follow up with the [facility] RN for community transitional needs post facility.  ? ?For questions contact:  ? ?Natividad Brood, RN BSN CCM ?[facility] Liaison ? [phone] business mobile phone ?Toll free office [phone]  ?Fax number: [phone] ?[email or web address] ?[email or web address]  ? ? ? ?

## 2021-04-20 ENCOUNTER — Encounter (HOSPITAL_COMMUNITY): Payer: Self-pay | Admitting: Internal Medicine

## 2021-04-20 DIAGNOSIS — Z7189 Other specified counseling: Secondary | ICD-10-CM | POA: Diagnosis not present

## 2021-04-20 DIAGNOSIS — I214 Non-ST elevation (NSTEMI) myocardial infarction: Secondary | ICD-10-CM | POA: Diagnosis not present

## 2021-04-20 DIAGNOSIS — Z515 Encounter for palliative care: Secondary | ICD-10-CM | POA: Diagnosis not present

## 2021-04-20 DIAGNOSIS — Z66 Do not resuscitate: Secondary | ICD-10-CM | POA: Diagnosis not present

## 2021-04-20 DIAGNOSIS — Z992 Dependence on renal dialysis: Secondary | ICD-10-CM | POA: Diagnosis not present

## 2021-04-20 DIAGNOSIS — R778 Other specified abnormalities of plasma proteins: Secondary | ICD-10-CM | POA: Diagnosis not present

## 2021-04-20 DIAGNOSIS — N186 End stage renal disease: Secondary | ICD-10-CM | POA: Diagnosis not present

## 2021-04-20 LAB — GLUCOSE, CAPILLARY
Glucose-Capillary: 372 mg/dL — ABNORMAL HIGH (ref 70–99)
Glucose-Capillary: 246 mg/dL — ABNORMAL HIGH (ref 70–99)
Glucose-Capillary: 197 mg/dL — ABNORMAL HIGH (ref 70–99)
Glucose-Capillary: 299 mg/dL — ABNORMAL HIGH (ref 70–99)

## 2021-04-20 LAB — TROPONIN I (HIGH SENSITIVITY): Troponin I (High Sensitivity): 3811 ng/L (ref <18)

## 2021-04-20 LAB — RENAL FUNCTION PANEL
Albumin: <1.5 g/dL — ABNORMAL LOW (ref 3.5–5.0)
Anion gap: 16 — ABNORMAL HIGH (ref 5–15)
BUN: 92 mg/dL — ABNORMAL HIGH (ref 6–20)
CO2: 20 mmol/L — ABNORMAL LOW (ref 22–32)
Calcium: 6.9 mg/dL — ABNORMAL LOW (ref 8.9–10.3)
Chloride: 96 mmol/L — ABNORMAL LOW (ref 98–111)
Creatinine, Ser: 13.52 mg/dL — ABNORMAL HIGH (ref 0.44–1.00)
GFR, Estimated: 3 mL/min — ABNORMAL LOW (ref >60)
Glucose, Bld: 391 mg/dL — ABNORMAL HIGH (ref 70–99)
Phosphorus: 8.7 mg/dL — ABNORMAL HIGH (ref 2.5–4.6)
Potassium: 3.9 mmol/L (ref 3.5–5.1)
Sodium: 132 mmol/L — ABNORMAL LOW (ref 135–145)

## 2021-04-20 MED ORDER — INSULIN ASPART 100 UNIT/ML IJ SOLN
0.0000 [IU] | Freq: Every day | INTRAMUSCULAR | Status: DC
  Administered 2021-04-20 – 2021-04-22 (×2): 3 [IU] via SUBCUTANEOUS

## 2021-04-20 MED ORDER — INSULIN ASPART 100 UNIT/ML IJ SOLN
0.0000 [IU] | Freq: Three times a day (TID) | INTRAMUSCULAR | Status: DC
  Administered 2021-04-20: 2 [IU] via SUBCUTANEOUS
  Administered 2021-04-20: 3 [IU] via SUBCUTANEOUS
  Administered 2021-04-21: 9 [IU] via SUBCUTANEOUS
  Administered 2021-04-21 (×2): 2 [IU] via SUBCUTANEOUS
  Administered 2021-04-22 – 2021-04-23 (×2): 1 [IU] via SUBCUTANEOUS
  Administered 2021-04-24: 2 [IU] via SUBCUTANEOUS
  Administered 2021-04-24: 1 [IU] via SUBCUTANEOUS

## 2021-04-20 MED ORDER — DELFLEX-LC/2.5% DEXTROSE 394 MOSM/L IP SOLN
INTRAPERITONEAL | Status: DC
  Administered 2021-04-20: 5000 mL via INTRAPERITONEAL

## 2021-04-20 MED ORDER — INSULIN GLARGINE-YFGN 100 UNIT/ML ~~LOC~~ SOLN
18.0000 [IU] | Freq: Every day | SUBCUTANEOUS | Status: DC
  Administered 2021-04-20 – 2021-04-26 (×5): 18 [IU] via SUBCUTANEOUS
  Filled 2021-04-20 (×8): qty 0.18

## 2021-04-20 NOTE — Progress Notes (Signed)
? ?Palliative Medicine Inpatient Follow Up Note ? ? ?HPI: ?61 year old with a history of DM2, HTN, HLD, diabetic neuropathy, and ESRD on PD who presented to the ED with severe generalized weakness for approximately 6 to 7 days and an eventual pre-syncopal spell resulting in a fall without loss of consciousness.  After her fall she was too weak to get up and spent the night on the floor, thusly missing her PD.  A neighbor called EMS who arrived to find the patient on the floor with stable vital signs and a normal CBG.  In the ED the patient was hypothermic with temperature 95.4.  WBC was borderline at 10. ?  ?Palliative care has been asked to get involved to discuss goals of care in the setting of multiple co-morbidities.  ? ?Today's Discussion (04/20/2021): ? ?*Please note that this is a verbal dictation therefore any spelling or grammatical errors are due to the "[employer] One" system interpretation. ? ?Chart reviewed inclusive of vital signs, progress notes, laboratory results, and diagnostic images.  ? ?I met with Ciji at bedside this morning. She is alert and oriented x4. We reviewed that last night she was quite sleepy and I was concerned at that time about her mental state. She shares that she remembers me bu had not slept therefore was more out of sorts. ? ?A comprehensive conversation was held regarding resuscitation status. Encouraged Ciella to consider DNR/DNI status understanding evidenced based poor outcomes in similar hospitalized patient, as the cause of arrest is likely associated with advanced chronic/terminal illness rather than an easily reversible acute cardio-pulmonary event. I explained that DNR/DNI does not change the medical plan and it only comes into effect after a person has arrested (died).  It is a protective measure to keep [place] from harming the patient in their last moments of life. Aneita shares that she would not wish to live on artificial means.  ? ?Reviewed again that Disha would not  ever desire to be placed back on HD and she only wishes to pursue peritoneal dialysis. ? ?The bigger concern at this time is that Mertice realizes she is less safe to live on her own. She does not have any children for whom she can rely on therefore worries about where she will go from here. Created space and opportunity for patient to explore thoughts feelings and fears regarding current medical situation. ? ?We discussed that the next step is SNF though beyond that she and Tommie Sams will need to talk to try to outline a safe plan.  ? ?Plan to get advance directives done in house. Alfie requests information on durable will which was provided though I shared we do not complete those in house.  ? ?Questions and concerns addressed  ? ?Palliative Support Provided.  ? ?Objective Assessment: ?Vital Signs ?Vitals:  ? 04/20/21 0621 04/20/21 0809  ?BP: (!) 145/87 118/66  ?Pulse: 97 96  ?Resp: 20 20  ?Temp: 98 ?F (36.7 ?C) 98.6 ?F (37 ?C)  ?SpO2: 98% 95%  ? ? ?Intake/Output Summary (Last 24 hours) at 04/20/2021 0913 ?Last data filed at 04/20/2021 0263 ?Gross per 24 hour  ?Intake 11029 ml  ?Output 12782 ml  ?Net -1753 ml  ? ? ?Last Weight  Most recent update: 04/20/2021  6:08 AM  ? ? Weight  ?63 kg (138 lb 14.2 oz)  ?      ? ?  ? ?Gen:  Older AA F in NAD ?HEENT: moist mucous membranes ?CV: regular rate and rhythm ?PULM: On  RA, breathing nonlabored ?ABD: soft/nontender ?EXT: No edema  ?Neuro: Disoriented ? ?SUMMARY OF RECOMMENDATIONS   ?DNAR/DNI ? ?Plan to continue current care ? ?Will meet with niece once she visits from [place] for further [place] conversations ? ?Patient plans to go to SNF though does now know where she would go thereafter ? ?Dose not with to pursue HD ? ?Ongoing PMT support ? ?Time Spent: 65 ? ?MDM - High ?______________________________________________________________________________________ ?Tacey Ruiz ?[place] Team ?Team Cell Phone: [phone] ?Please utilize secure chat with  additional questions, if there is no response within 30 minutes please call the above phone number ? ?Palliative Medicine Team providers are available by phone from 7am to 7pm daily and can be reached through the team cell phone.  ?Should this patient require assistance outside of these hours, please call the patient's attending physician. ? ? ? ? ?

## 2021-04-20 NOTE — Plan of Care (Signed)
?  Problem: Clinical Measurements: ?Goal: Will remain free from infection ?Outcome: Progressing ?Goal: Respiratory complications will improve ?Outcome: Progressing ?  ?Problem: Education: ?Goal: Knowledge of General Education information will improve ?Description: Including pain rating scale, medication(s)/side effects and non-pharmacologic comfort measures ?Outcome: Not Progressing ?  ?Problem: Health Behavior/Discharge Planning: ?Goal: Ability to manage health-related needs will improve ?Outcome: Not Progressing ?  ?Problem: Clinical Measurements: ?Goal: Diagnostic test results will improve ?Outcome: Not Progressing ?Goal: Cardiovascular complication will be avoided ?Outcome: Not Progressing ?  ?

## 2021-04-20 NOTE — TOC Progression Note (Addendum)
Transition of Care (TOC) - Progression Note  ? ? ?Patient Details  ?Name: Ann Bryan ?MRN: [id number] ?Date of Birth: 1960/01/15 ? ?Transition of Care (TOC) CM/SW Contact  ?Bary Castilla, LCSW ?Phone Number: [phone] ?04/20/2021, 11:27 AM ? ?Clinical Narrative:    ? ?CSW met with pt to provide the 4 bed offers that have once back which are provided below. CSW marked 4 bed offers on pt's medicare.gov rating list. Pt explained that her friend would be assisting with making due the decision due to her stating that she does not feel up to making the decision. CSW left contact information if friend wanted to speak with CSW. ? ?[facility] Preferred SNF  Accepted N/A [address], [place] [address] [phone] --  ?[facility] Preferred SNF  Accepted N/A [address] [place] [address] [phone] --  ?[facility]  Accepted N/A [address], [place] [address] [phone] --  ?[facility] Preferred SNF  Accepted N/A [address], [place] [address] [phone]   ? ?10:30: CSW was notified that pt is now agreeable to HD which will make her eligible for SNF. SNFs will need to updated once HD begins and renal navigator will need to assist with coordinating a facility. ? ?TOC team will continue to assist with discharge planning needs.  ? ?Expected Discharge Plan: [place] ?Barriers to Discharge: Continued Medical Work up ? ?Expected Discharge Plan and Services ?Expected Discharge Plan: [place] ?In-house Referral: Clinical Social Work ?Discharge Planning Services: CM Consult ?Post Acute Care Choice: [place] ?Living arrangements for the past 2 months: Apartment ?                ?  ?  ?  ?  ?  ?  ?  ?  ?  ?  ? ? ?Social Determinants of Health (SDOH) Interventions ?  ? ?Readmission Risk Interventions ?   ? View :  No data to display.  ?  ?  ?  ? ? ?

## 2021-04-20 NOTE — Progress Notes (Signed)
? ?Progress Note ? ?Patient Name: Ann Bryan ?Date of Encounter: 04/20/2021 ? ?[facility] Cardiologist: New ? ? ?Patient Profile  ?   ?  ?  ?Ann Bryan is a 61 y.o. female with a hx of HTN, diabetes, diabetic nephropathy that lead to ESRD on PD, HLD, anxiety/depression having present hypothermic ? Sepsis  seen 04/11/2021 for the evaluation of NSTEMI ?Echo 4/11 >> EF 21%  Bi Ventricular dysfunction>> non tamponading pericardial effusion  ?Cath  3 V CAD  LADm-75%: OM3-99%: CX-T; RCA-T. L>>R collaterals ?Tachycardia--SVT >> amio  ?Palliative care discussions re goals of care ? ? ?Subjective  ? ?No chest pain or shortness of breath.  Complaining still of her foot hurting ? ?Inpatient Medications  ?  ?Scheduled Meds: ? amiodarone  200 mg Oral BID  ? aspirin  81 mg Oral Daily  ? atorvastatin  80 mg Oral Daily  ? brimonidine  1 drop Left Eye BID  ? calcitRIOL  0.5 mcg Oral Daily  ? calcium acetate  2,001 mg Oral TID WC  ? gabapentin  100 mg Oral Daily  ? gentamicin cream  1 application. Topical Daily  ? heparin  5,000 Units Subcutaneous Q8H  ? insulin aspart  0-5 Units Subcutaneous QHS  ? insulin aspart  0-9 Units Subcutaneous TID WC  ? insulin glargine-yfgn  18 Units Subcutaneous Daily  ? loratadine  10 mg Oral Daily  ? sodium chloride flush  3 mL Intravenous Q12H  ? ?Continuous Infusions: ? sodium chloride    ? dialysis solution 2.5% low-MG/low-CA    ? ?PRN Meds: ?sodium chloride, acetaminophen, calcium acetate, hydrALAZINE, HYDROmorphone (DILAUDID) injection, nitroGLYCERIN, ondansetron (ZOFRAN) IV, prochlorperazine, sodium chloride flush  ? ?Vital Signs  ?  ?Vitals:  ? 04/20/21 7673 04/20/21 4193 04/20/21 7902 04/20/21 0809  ?BP: 128/76  (!) 145/87 118/66  ?Pulse: 93  97 96  ?Resp: 15  20 20   ?Temp: 98.2 ?F (36.8 ?C)  98 ?F (36.7 ?C) 98.6 ?F (37 ?C)  ?TempSrc: Oral  Temporal Oral  ?SpO2: 98%  98% 95%  ?Weight:  63 kg    ?Height:      ? ? ?Intake/Output Summary (Last 24 hours) at 04/20/2021 0938 ?Last data filed at  04/20/2021 4097 ?Gross per 24 hour  ?Intake 11029 ml  ?Output 12782 ml  ?Net -1753 ml  ? ? ?  04/20/2021  ?  6:08 AM 04/19/2021  ?  5:10 PM 04/19/2021  ?  5:10 AM  ?Last 3 Weights  ?Weight (lbs) 138 lb 14.2 oz 143 lb 4.8 oz 145 lb 15.1 oz  ?Weight (kg) 63 kg 65 kg 66.2 kg  ?   ? ?Telemetry  ?  ?Sinus rhythm.- Personally Reviewed ? ?ECG  ?  ?No new  - Personally Reviewed ? ?Physical Exam  ?Well developed and nourished in no acute distress ?HENT normal ?Neck supple   ?Clear ?Regular rate and rhythm, no murmurs or gallops ?Abd-soft with active BS ?No Clubbing cyanosis trace edema ?Skin-warm and dry ?A & Oriented  Grossly normal sensory and motor function ?  ? ?Labs  ?  ?High Sensitivity Troponin:   ?Recent Labs  ?Lab 04/20/2021 ?1202 04/20/2021 ?2003 04/17/21 ?0352 04/17/21 ?0500  ?TROPONINIHS 2,296* 1,704* 2,531* 2,204*  ?   ?Chemistry ?Recent Labs  ?Lab 04/12/2021 ?1202 05/02/2021 ?1234 04/10/2021 ?0028 04/25/2021 ?1139 04/06/2021 ?1147 04/19/21 ?3532 04/20/21 ?0103  ?NA 132*   < > 129*   < > 116* 128* 132*  ?K 4.7   < >  4.1   < > 3.5 4.2 3.9  ?CL 93*   < > 95*  --   --  91* 96*  ?CO2 13*   < > 15*  --   --  18* 20*  ?GLUCOSE 97   < > 305*  --   --  347* 391*  ?BUN 114*   < > 115*  --   --  105* 92*  ?CREATININE 16.03*   < > 15.04*  --   --  14.55* 13.52*  ?CALCIUM 6.5*   < > 6.3*  --   --  6.5* 6.9*  ?MG 1.9  --   --   --   --   --   --   ?PROT 5.3*  --  4.0*  --   --  4.3*  --   ?ALBUMIN 2.0*  --  <1.5*  --   --  <1.5* <1.5*  ?AST 27  --  16  --   --  13*  --   ?ALT 15  --  17  --   --  15  --   ?ALKPHOS 171*  --  192*  --   --  170*  --   ?BILITOT 0.8  --  0.9  --   --  0.6  --   ?GFRNONAA 2*   < > 3*  --   --  3* 3*  ?ANIONGAP 26*   < > 19*  --   --  19* 16*  ? < > = values in this interval not displayed.  ?  ?Lipids No results for input(s): CHOL, TRIG, HDL, LABVLDL, LDLCALC, CHOLHDL in the last 168 hours.  ?Hematology ?Recent Labs  ?Lab 04/17/21 ?0352 05/03/2021 ?0028 05/02/2021 ?1139 04/22/2021 ?1147 04/19/21 ?1610  ?WBC 11.0* 11.4*   --   --  12.5*  ?RBC 4.40 4.64  --   --  4.81  ?HGB 12.6 12.9 12.6  12.2 12.2 13.6  ?HCT 37.2 39.2 37.0  36.0 36.0 40.3  ?MCV 84.5 84.5  --   --  83.8  ?MCH 28.6 27.8  --   --  28.3  ?MCHC 33.9 32.9  --   --  33.7  ?RDW 13.5 13.5  --   --  13.6  ?PLT 251 287  --   --  304  ? ?Thyroid  ?Recent Labs  ?Lab 04/12/2021 ?1202  ?TSH 0.724  ?  ?BNPNo results for input(s): BNP, PROBNP in the last 168 hours.  ?DDimer No results for input(s): DDIMER in the last 168 hours.  ? ?Radiology  ?  ?CARDIAC CATHETERIZATION ? ?Result Date: 04/08/2021 ?CONCLUSIONS: Probable mixed ischemic and nonischemic systolic heart failure, compensated with LVEDP normal but with elevated capillary wedge pressure. Hemodynamics/cardiac output suggest cardiogenic shock although measurements were made in the setting of intermittent supraventricular tachycardia at which time systolic blood pressures would be less than 80 mmHg. Three-vessel coronary artery disease with 75% mid LAD, 99% mid third obtuse marginal, total occlusion of the circumflex beyond the third obtuse marginal, total occlusion of the dominant right coronary proximally.  There are left-to-right collaterals. RECOMMENDATIONS: Severe calcification of coronary arteries and very low ejection fraction and blood pressure make PCI unappealing.  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 Recommend advanced heart failure team consultation. Prognosis and treatment strategy are limited given end-stage renal disease.   ? ?Cardiac Studies  ? ? ?CONCLUSIONS: ?Probable mixed ischemic and nonischemic systolic heart failure, compensated with LVEDP normal but with elevated capillary  wedge pressure. ?Hemodynamics/cardiac output suggest cardiogenic shock although measurements were made in the setting of intermittent supraventricular tachycardia at which time systolic blood pressures would be less than 80 mmHg. ?Three-vessel coronary artery disease with 75% mid LAD, 99% mid third obtuse  marginal, total occlusion of the circumflex beyond the third obtuse marginal, total occlusion of the dominant right coronary proximally.  There are left-to-right collaterals. ?  ?RECOMMENDATIONS: ?  ?Severe calcification of coronary arteries and very low ejection fraction and blood pressure make PCI unappealing.  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 ?Recommend advanced heart failure team consultation. ?Prognosis and treatment strategy are limited given end-stage renal disease. ?Echo 04/22/2021 ? 1. Left ventricular ejection fraction by 3D volume is 21 %. The left  ?ventricle has severely decreased function. The left ventricle demonstrates  ?global hypokinesis. Left ventricular diastolic parameters are consistent  ?with Grade I diastolic dysfunction  ?(impaired relaxation). Elevated left ventricular end-diastolic pressure.  ? 2. Right ventricular systolic function is moderately reduced. The right  ?ventricular size is mildly enlarged. There is normal pulmonary artery  ?systolic pressure.  ? 3. Left atrial size was mildly dilated.  ? 4. There is small to moderate circumferential pericardial effusion. There  ?is no evidence of cardiac tamponade.  ? 5. The mitral valve is normal in structure. Mild mitral valve  ?regurgitation. No evidence of mitral stenosis.  ? 6. The aortic valve is normal in structure. Aortic valve regurgitation is  ?not visualized. No aortic stenosis is present.  ? 7. The inferior vena cava is normal in size with greater than 50%  ?respiratory variability, suggesting right atrial pressure of 3 mmHg.  ? ?Comparison(s): No prior Echocardiogram.  ? ? ?Assessment & Plan  ?CAD-3V not amenable to revascularization ? ?NSTEMI  ? ?HFrEF ? ?ESRD on PD ? ?Hypoalbuminemia ? ?SVT ?  ? ?  ?Not sure of the Dx of NSTEMI-- no great history and no prior Tn, but if real, should consider clopidogrel >> recheck Tn and see if trend is more clearly implicating ? ?Asked if she would continue with PT  and she said yes ? ?With her cardiomyopathy, we will begin low-dose hydralazine and add nitrates in the next 24-48 hours as blood pressure tolerates. ? ? ?  ? ?Virl Axe MD  ? ? ? ?For questi2ns or upda

## 2021-04-20 NOTE — Progress Notes (Signed)
?[facility] ?Progress Note  ? ?Subjective:   seen in pt's room, MS good today, Ox 3.  ? ?Objective ?Vitals:  ? 04/19/21 2344 04/20/21 2725 04/20/21 3664 04/20/21 4034  ?BP: 115/72 128/76  (!) 145/87  ?Pulse: 99 93  97  ?Resp: 16 15  20   ?Temp: 98.2 ?F (36.8 ?C) 98.2 ?F (36.8 ?C)  98 ?F (36.7 ?C)  ?TempSrc: Oral Oral  Temporal  ?SpO2: 96% 98%  98%  ?Weight:   63 kg   ?Height:      ? ?Physical Exam ?General: alert female in NAD ?Heart: RRR, no murmurs, rubs or gallops ?Lungs: CTA bilaterally without wheezing, rhonchi or rales ?Abdomen: Soft, non-tender, non-distended, +BS ?Extremities: trace- 1+ edema left lower extremity ?Dialysis Access: PD cath in lower abd, clean and dry ? ? ? ?Dialysis Orders: ?Nephrologist: Dr. Joelyn Oms. RN: Melisa ?PD 7 days per week, EDW 62kg, 4 exchanges, Dwell time 1.5 hours, Fill volume 2.2L, no day exchanges ?2 exchanges with 1.5% dextrose and two exchanges with 2.5% dextrose ? ?Assessment/Plan: ? Generalized weakness/ AMS: sepsis work up per primary team. Also looking at cardiac ischemia and uremia as contributors. Mental status seems normal today, Ox 3, judgment intact.  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 Her BUN here was considerably high. She remains very opposed to hemodialysis and her old fistula is not functional. We have added a PD exchange here (4 > 5 per night).  ? Hypertension/volume: BP is controlled. Some mild edema on exam. Continue all 2.5 % fluids ? Anemia: Hgb elevated, no ESA indicated ? Metabolic bone disease: phos 10.8. Admits to issues with binder compliance. Will resume calcium acetate and calcitriol. Corrected calcium is adequate ? Nutrition:  Albumin quite low with poor intake. Renal/carb modified diet ?NSTEMI: Troponin elevated, per pmd ?T2DM: per pmd.  ?Dispo/ GOC -  difficult situation, pt is possibly failing PD, but refuses HD (this is not new). She would not be SNF candidate if doing PD, SNF's will only take HD pts w/ esrd.  Pt is aware and is discussing actively [place] w/ palliative team this weekend.   ?  ? ?Kelly Splinter, MD ?04/20/2021, 6:44 AM ? ?Recent Labs  ?Lab 04/27/2021 ?0028 04/13/2021 ?1139 05/04/2021 ?1147 04/19/21 ?7425 04/20/21 ?0103  ?HGB 12.9   < > 12.2 13.6  --   ?ALBUMIN <1.5*  --   --  <1.5* <1.5*  ?CALCIUM 6.3*  --   --  6.5* 6.9*  ?PHOS 9.5*  --   --   --  8.7*  ?CREATININE 15.04*  --   --  14.55* 13.52*  ?K 4.1   < > 3.5 4.2 3.9  ? < > = values in this interval not displayed.  ? ? ? ? ?Medications: ? sodium chloride    ? dialysis solution 2.5% low-MG/low-CA    ? ? amiodarone  200 mg Oral BID  ? aspirin  81 mg Oral Daily  ? atorvastatin  80 mg Oral Daily  ? brimonidine  1 drop Left Eye BID  ? calcitRIOL  0.5 mcg Oral Daily  ? calcium acetate  2,001 mg Oral TID WC  ? gabapentin  100 mg Oral Daily  ? gentamicin cream  1 application. Topical Daily  ? heparin  5,000 Units Subcutaneous Q8H  ? insulin aspart  0-6 Units  Subcutaneous TID WC  ? insulin glargine-yfgn  10 Units Subcutaneous Daily  ? loratadine  10 mg Oral Daily  ? sodium chloride flush  3 mL Intravenous Q12H  ? ? ? ? ?

## 2021-04-20 NOTE — Progress Notes (Signed)
? Ann Bryan  RKY:[id number] DOB: 1960/11/16 DOA: 04/19/2021 ?PCP: Ladell Pier, MD   ? ?Brief Narrative:  ?61 year old with a history of DM2, HTN, HLD, diabetic neuropathy, and ESRD on PD who presented to the ED with severe generalized weakness for approximately 6 to 7 days and an eventual pre-syncopal spell resulting in a fall without loss of consciousness.  After her fall she was too weak to get up and spent the night on the floor, thusly missing her PD.  A neighbor called EMS who arrived to find the patient on the floor with stable vital signs and a normal CBG.  In the ED the patient was hypothermic with temperature 95.4.  WBC was borderline at 10. ? ?Consultants:  ?Cardiology - CHF Team  ?Nephrology ?Palliative Care  ? ?Code Status: NO CODE BLUE ? ?DVT prophylaxis: ?Subcutaneous heparin ? ?Interim Hx: ?Afebrile.  Vital signs stable.  Saturation 95% room air.  CBG improved but remains elevated at 215-372.  In good spirits at the time of my visit.  Reports improving appetite.  Seems more alert and interactive.  Denies chest pain nausea vomiting abdominal pain. ? ?Assessment & Plan: ? ?Near syncope / fall / generalized weakness - severe failure to thrive ?due to severe systolic CHF and persistent uremia  ? ?Newly diagnosed severe systolic CHF ?TTE noted EF 21% w/ severely decreased LV systolic fxn -cardiac cath 4/13 suggests combination of ischemic and nonischemic etiologies - volume control per PD for now - advanced heart failure team reports no real options for intervention beyond titration of medical care ? ?3 vessel calcific CAD - NSTEMI ?Cardiology following - BB on hold due to low blood pressure and mild bradycardia - initial troponin at presentation quite elevated at 2296 - cardiac cath has revealed signif 3 vessel disease that is not amenable to intervention  ? ?Bursts of SVT ?Documented while patient in cardiac Cath Lab -appear to last less than 1 minute but are associated with blood pressure  drops - amiodarone added by cardiology ? ?Mild rhabdomyolysis - resolved  ?CK 1209 at presentation after laying in floor all night  ? ?DM2 -uncontrolled with hyperglycemia ?CBG consistently elevated and A1c very high at >15.5 - continue to titrate insulin dose  ? ?Peripheral edema ?no DVT on venous duplex - a consequence of ESRD as well as severe systolic CHF - cont PD as tolerates  ? ?ESRD on PD ?Resumed usual PD under guidance of nephrology -there are concerns that PD is not working well for her but reportedly the patient is vehemently opposed to HD - this will make her disposition quite challenging as she can't have PD in a SNF, but is not able to live independently any more  ? ?Possible sepsis - ruled out clinically  ?This diagnosis was based on hypothermia at presentation as well as elevated lactate -these findings are explained by the fact that she slept on the floor all night prior to her presentation  ? ?Goals of care ?Unfortunately this patient finds herself in a very difficult situation due to multiple severe medical issues that do not have solutions -furthermore she lives alone and will not likely be able to care for herself going forward - Palliative Care assisting with complex goals of care ? ?Family Communication: No family present at time of exam ?Disposition: From home - clear now that she will not be able to live independently at home ? ?Objective: ?Blood pressure 118/66, pulse 96, temperature 98.6 ?F (37 ?C), temperature source  Oral, resp. rate 20, height 5' (1.524 m), weight 63 kg, last menstrual period 03/13/2002, SpO2 95 %. ? ?Intake/Output Summary (Last 24 hours) at 04/20/2021 7902 ?Last data filed at 04/20/2021 4097 ?Gross per 24 hour  ?Intake 11029 ml  ?Output 12782 ml  ?Net -1753 ml  ? ? ?Filed Weights  ? 04/19/21 0510 04/19/21 1710 04/20/21 0608  ?Weight: 66.2 kg 65 kg 63 kg  ? ? ?Examination: ?General: No acute respiratory distress ?Lungs: Mild bibasilar crackles with no  wheezing ?Cardiovascular: Regular rate and rhythm  ?Abdomen: Nontender, nondistended, soft, bowel sounds positive, no rebound ?Extremities: Persistent 1+ bilateral lower extremity edema ? ?CBC: ?Recent Labs  ?Lab 04/15/2021 ?1209 04/13/2021 ?1234 04/17/21 ?0352 04/20/2021 ?0028 04/25/2021 ?1139 04/30/2021 ?1147 04/19/21 ?3532  ?WBC 10.0  --  11.0* 11.4*  --   --  12.5*  ?NEUTROABS 8.2*  --   --   --   --   --   --   ?HGB 15.8*   < > 12.6 12.9 12.6  12.2 12.2 13.6  ?HCT 46.5*   < > 37.2 39.2 37.0  36.0 36.0 40.3  ?MCV 85.2  --  84.5 84.5  --   --  83.8  ?PLT 276  --  251 287  --   --  304  ? < > = values in this interval not displayed.  ? ? ?Basic Metabolic Panel: ?Recent Labs  ?Lab 05/05/2021 ?1202 05/02/2021 ?1234 05/03/2021 ?0028 04/29/2021 ?1139 04/22/2021 ?1147 04/19/21 ?9924 04/20/21 ?0103  ?NA 132*   < > 129*   < > 116* 128* 132*  ?K 4.7   < > 4.1   < > 3.5 4.2 3.9  ?CL 93*   < > 95*  --   --  91* 96*  ?CO2 13*   < > 15*  --   --  18* 20*  ?GLUCOSE 97   < > 305*  --   --  347* 391*  ?BUN 114*   < > 115*  --   --  105* 92*  ?CREATININE 16.03*   < > 15.04*  --   --  14.55* 13.52*  ?CALCIUM 6.5*   < > 6.3*  --   --  6.5* 6.9*  ?MG 1.9  --   --   --   --   --   --   ?PHOS 10.8*  --  9.5*  --   --   --  8.7*  ? < > = values in this interval not displayed.  ? ? ?GFR: ?Estimated Creatinine Clearance: 3.7 mL/min (A) (by C-G formula based on SCr of 13.52 mg/dL (H)). ? ?Liver Function Tests: ?Recent Labs  ?Lab 04/12/2021 ?1202 04/09/2021 ?0028 04/19/21 ?2683 04/20/21 ?0103  ?AST 27 16 13*  --   ?ALT 15 17 15   --   ?ALKPHOS 171* 192* 170*  --   ?BILITOT 0.8 0.9 0.6  --   ?PROT 5.3* 4.0* 4.3*  --   ?ALBUMIN 2.0* <1.5* <1.5* <1.5*  ? ? ? ?Cardiac Enzymes: ?Recent Labs  ?Lab 04/17/2021 ?1202 04/17/21 ?0352 04/09/2021 ?0028  ?CKTOTAL 1,209* 526* 378*  ? ? ? ?HbA1C: ?Hemoglobin A1C  ?Date/Time Value Ref Range Status  ?02/29/2020 11:26 AM 13.9 (A) 4.0 - 5.6 % Final  ?11/24/2019 10:59 AM 10.3 (A) 4.0 - 5.6 % Final  ? ?HbA1c, POC (controlled diabetic  range)  ?Date/Time Value Ref Range Status  ?02/26/2021 04:28 PM 12.2 (A) 0.0 - 7.0 % Final  ?11/18/2018 02:02 PM  7.3 (A) 0.0 - 7.0 % Final  ? ?Hgb A1c MFr Bld  ?Date/Time Value Ref Range Status  ?04/13/2021 12:09 PM >15.5 (H) 4.8 - 5.6 % Final  ?  Comment:  ?  (NOTE) ?**Verified by repeat analysis** ?        Prediabetes: 5.7 - 6.4 ?        Diabetes: >6.4 ?        Glycemic control for adults with diabetes: <7.0 ?  ?05/06/2019 12:22 PM 9.8 (H) 4.8 - 5.6 % Final  ?  Comment:  ?           Prediabetes: 5.7 - 6.4 ?         Diabetes: >6.4 ?         Glycemic control for adults with diabetes: <7.0 ?  ? ? ?CBG: ?Recent Labs  ?Lab 04/19/21 ?0727 04/19/21 ?1212 04/19/21 ?1600 04/19/21 ?2126 04/20/21 ?0606  ?GLUCAP [place] ? ? ?Scheduled Meds: ? amiodarone  200 mg Oral BID  ? aspirin  81 mg Oral Daily  ? atorvastatin  80 mg Oral Daily  ? brimonidine  1 drop Left Eye BID  ? calcitRIOL  0.5 mcg Oral Daily  ? calcium acetate  2,001 mg Oral TID WC  ? gabapentin  100 mg Oral Daily  ? gentamicin cream  1 application. Topical Daily  ? heparin  5,000 Units Subcutaneous Q8H  ? insulin aspart  0-6 Units Subcutaneous TID WC  ? insulin glargine-yfgn  10 Units Subcutaneous Daily  ? loratadine  10 mg Oral Daily  ? sodium chloride flush  3 mL Intravenous Q12H  ? ?Continuous Infusions: ? sodium chloride    ? dialysis solution 2.5% low-MG/low-CA    ? ? ? LOS: 4 days  ? ?Cherene Altes, MD ?[facility] ?Office  [phone] ?Pager - Text Page per Shea Evans ? ?If 7PM-7AM, please contact night-coverage per Amion ?04/20/2021, 8:22 AM ? ? ? ?

## 2021-04-21 DIAGNOSIS — Z66 Do not resuscitate: Secondary | ICD-10-CM | POA: Diagnosis not present

## 2021-04-21 DIAGNOSIS — Z7189 Other specified counseling: Secondary | ICD-10-CM | POA: Diagnosis not present

## 2021-04-21 DIAGNOSIS — I214 Non-ST elevation (NSTEMI) myocardial infarction: Secondary | ICD-10-CM | POA: Diagnosis not present

## 2021-04-21 DIAGNOSIS — Z515 Encounter for palliative care: Secondary | ICD-10-CM | POA: Diagnosis not present

## 2021-04-21 LAB — CULTURE, BLOOD (ROUTINE X 2): Culture: NO GROWTH

## 2021-04-21 LAB — CBC
HCT: 36.2 % (ref 36.0–46.0)
Hemoglobin: 11.8 g/dL — ABNORMAL LOW (ref 12.0–15.0)
MCH: 27.8 pg (ref 26.0–34.0)
MCHC: 32.6 g/dL (ref 30.0–36.0)
MCV: 85.2 fL (ref 80.0–100.0)
Platelets: 413 10*3/uL — ABNORMAL HIGH (ref 150–400)
RBC: 4.25 MIL/uL (ref 3.87–5.11)
RDW: 13.7 % (ref 11.5–15.5)
WBC: 11.9 10*3/uL — ABNORMAL HIGH (ref 4.0–10.5)
nRBC: 0 % (ref 0.0–0.2)

## 2021-04-21 LAB — RENAL FUNCTION PANEL
Albumin: <1.5 g/dL — ABNORMAL LOW (ref 3.5–5.0)
Anion gap: 15 (ref 5–15)
BUN: 80 mg/dL — ABNORMAL HIGH (ref 6–20)
CO2: 21 mmol/L — ABNORMAL LOW (ref 22–32)
Calcium: 7.3 mg/dL — ABNORMAL LOW (ref 8.9–10.3)
Chloride: 96 mmol/L — ABNORMAL LOW (ref 98–111)
Creatinine, Ser: 12.76 mg/dL — ABNORMAL HIGH (ref 0.44–1.00)
GFR, Estimated: 3 mL/min — ABNORMAL LOW (ref >60)
Glucose, Bld: 386 mg/dL — ABNORMAL HIGH (ref 70–99)
Phosphorus: 7.5 mg/dL — ABNORMAL HIGH (ref 2.5–4.6)
Potassium: 3.8 mmol/L (ref 3.5–5.1)
Sodium: 132 mmol/L — ABNORMAL LOW (ref 135–145)

## 2021-04-21 LAB — GLUCOSE, CAPILLARY
Glucose-Capillary: 352 mg/dL — ABNORMAL HIGH (ref 70–99)
Glucose-Capillary: 192 mg/dL — ABNORMAL HIGH (ref 70–99)
Glucose-Capillary: 160 mg/dL — ABNORMAL HIGH (ref 70–99)
Glucose-Capillary: 121 mg/dL — ABNORMAL HIGH (ref 70–99)

## 2021-04-21 MED ORDER — CEFAZOLIN SODIUM-DEXTROSE 2-4 GM/100ML-% IV SOLN
2.0000 g | INTRAVENOUS | Status: AC
  Administered 2021-04-22: 2 g via INTRAVENOUS
  Filled 2021-04-21: qty 100

## 2021-04-21 MED ORDER — INSULIN ASPART 100 UNIT/ML IJ SOLN
3.0000 [IU] | Freq: Three times a day (TID) | INTRAMUSCULAR | Status: DC
  Administered 2021-04-21 – 2021-04-23 (×4): 3 [IU] via SUBCUTANEOUS

## 2021-04-21 MED ORDER — ISOSORBIDE MONONITRATE ER 30 MG PO TB24
15.0000 mg | ORAL_TABLET | Freq: Every day | ORAL | Status: DC
  Administered 2021-04-21 – 2021-04-24 (×3): 15 mg via ORAL
  Filled 2021-04-21 (×3): qty 1

## 2021-04-21 MED ORDER — CHLORHEXIDINE GLUCONATE CLOTH 2 % EX PADS: 6.0000 | MEDICATED_PAD | Freq: Every day | CUTANEOUS | Status: DC

## 2021-04-21 MED ORDER — METOPROLOL TARTRATE 12.5 MG HALF TABLET: 12.5000 mg | ORAL_TABLET | Freq: Two times a day (BID) | ORAL | Status: DC

## 2021-04-21 MED ORDER — POVIDONE-IODINE 10 % EX SOLN
CUTANEOUS | Status: DC | PRN
  Filled 2021-04-21: qty 118

## 2021-04-21 MED ORDER — PHENOL 1.4 % MT LIQD: 1.0000 | OROMUCOSAL | Status: DC | PRN

## 2021-04-21 MED ORDER — CARVEDILOL 3.125 MG PO TABS
3.1250 mg | ORAL_TABLET | Freq: Two times a day (BID) | ORAL | Status: DC
  Administered 2021-04-21 – 2021-04-23 (×4): 3.125 mg via ORAL
  Filled 2021-04-21 (×4): qty 1

## 2021-04-21 MED ORDER — DELFLEX-LC/2.5% DEXTROSE 394 MOSM/L IP SOLN: INTRAPERITONEAL | Status: DC

## 2021-04-21 MED ORDER — HYDRALAZINE HCL 25 MG PO TABS
25.0000 mg | ORAL_TABLET | Freq: Three times a day (TID) | ORAL | Status: DC
  Administered 2021-04-21 – 2021-04-24 (×8): 25 mg via ORAL
  Filled 2021-04-21 (×8): qty 1

## 2021-04-21 NOTE — Progress Notes (Signed)
? Ann Bryan  RKY:[id number] DOB: August 15, 1960 DOA: 05/03/2021 ?PCP: Ladell Pier, MD   ? ?Brief Narrative:  ?37yowith a history of DM2, HTN, HLD, diabetic neuropathy, and ESRD on PD who presented to the ED with severe generalized weakness for approximately 6-7 days and an eventual pre-syncopal spell resulting in a fall without loss of consciousness.  After her fall she was too weak to get up and spent the night on the floor, thusly missing her PD.  A neighbor called EMS who arrived to find the patient on the floor with stable vital signs and a normal CBG.  In the ED the patient was hypothermic with temperature 95.4.  WBC was borderline at 10. ? ?Consultants:  ?Cardiology - CHF Team  ?Nephrology ?Palliative Care  ? ?Code Status: NO CODE BLUE ? ?DVT prophylaxis: ?Subcutaneous heparin ? ?Interim Hx: ?Afebrile.  Some mild tachycardia with heart rate 100-110 overnight.  Vitals otherwise stable.  Blood pressure modestly elevated with systolics 315-176.  BUN consistently improving with PD in the hospital.  Now complaining of generalized discomfort in both feet.  No chest pain or shortness of breath.  Reports improving appetite.  Confirms to me that she is willing to transition to hemodialysis in order to facilitate SNF placement as she recognizes she cannot live alone at this point. ? ?Assessment & Plan: ? ?Near syncope / fall / generalized weakness - severe failure to thrive ?due to severe systolic CHF and persistent uremia  ? ?Newly diagnosed severe systolic CHF ?TTE noted EF 21% w/ severely decreased LV systolic fxn -cardiac cath 4/13 suggests combination of ischemic and nonischemic etiologies - volume control per PD - advanced heart failure team reports no real options for intervention beyond titration of medical care ? ?3 vessel calcific CAD ?Cardiology following - initial troponin at presentation quite elevated at 2296 - cardiac cath has revealed signif 3 vessel disease that is not amenable to intervention -  attempt to add low dose BB today  ? ?Bursts of SVT ?Documented while patient in cardiac Cath Lab -appear to last less than 1 minute but are associated with blood pressure drops - amiodarone added by cardiology ? ?Mild rhabdomyolysis - resolved  ?CK 1209 at presentation after laying in floor all night  ? ?DM2 -uncontrolled with hyperglycemia ?A1c very high at >15.5 -insulin being titrated - CBG remains elevated -adjust dose again today ? ?Peripheral edema ?no DVT on venous duplex - a consequence of ESRD as well as severe systolic CHF - cont PD as tolerates  ? ?ESRD on PD ?Resumed usual PD under guidance of nephrology -there are concerns that PD is not working well for her but reportedly the patient is vehemently opposed to HD - this will make her disposition quite challenging as she can't have PD in a SNF, but is not able to live independently any more -interestingly her BUN is consistently trending downward with PD in the hospital, raising the question of incomplete compliance with PD at home ? ?Possible sepsis - ruled out clinically  ?This diagnosis was based on hypothermia at presentation as well as elevated lactate -these findings are explained by the fact that she slept on the floor all night prior to her presentation  ? ?Goals of care ?Unfortunately this patient finds herself in a very difficult situation due to multiple severe medical issues that do not have solutions -furthermore she lives alone and will not likely be able to care for herself going forward - Palliative Care assisting with complex  goals of care ? ?Family Communication: No family present at time of exam ?Disposition: From home - clear now that she will not be able to live independently at home ? ?Objective: ?Blood pressure (!) 162/82, pulse 95, temperature 98.4 ?F (36.9 ?C), temperature source Oral, resp. rate 20, height 5' (1.524 m), weight 67.1 kg, last menstrual period 03/13/2002, SpO2 100 %. ? ?Intake/Output Summary (Last 24 hours) at  04/21/2021 0818 ?Last data filed at 04/20/2021 2205 ?Gross per 24 hour  ?Intake 243 ml  ?Output --  ?Net 243 ml  ? ? ?Filed Weights  ? 04/19/21 1710 04/20/21 0608 04/21/21 0339  ?Weight: 65 kg 63 kg 67.1 kg  ? ? ?Examination: ?General: No acute respiratory distress ?Lungs: Clear to auscultation bilaterally ?Cardiovascular: Regular rate and rhythm  ?Abdomen: Nontender, nondistended, soft, bowel sounds positive, no rebound ?Extremities: Only trace lower extremity edema -no erythema or point tenderness of either foot -no significant pedal edema -no discoloration of toes or skin to suggest ischemia -no focal joint inflammation of either foot ? ?CBC: ?Recent Labs  ?Lab 04/19/2021 ?1209 04/06/2021 ?1234 04/21/2021 ?0028 04/21/2021 ?1139 04/25/2021 ?1147 04/19/21 ?4818 04/21/21 ?5631  ?WBC 10.0   < > 11.4*  --   --  12.5* 11.9*  ?NEUTROABS 8.2*  --   --   --   --   --   --   ?HGB 15.8*   < > 12.9   < > 12.2 13.6 11.8*  ?HCT 46.5*   < > 39.2   < > 36.0 40.3 36.2  ?MCV 85.2   < > 84.5  --   --  83.8 85.2  ?PLT 276   < > 287  --   --  304 413*  ? < > = values in this interval not displayed.  ? ? ?Basic Metabolic Panel: ?Recent Labs  ?Lab 05/01/2021 ?1202 05/04/2021 ?1234 04/10/2021 ?0028 04/17/2021 ?1139 04/19/21 ?4970 04/20/21 ?0103 04/21/21 ?2637  ?NA 132*   < > 129*   < > 128* 132* 132*  ?K 4.7   < > 4.1   < > 4.2 3.9 3.8  ?CL 93*   < > 95*  --  91* 96* 96*  ?CO2 13*   < > 15*  --  18* 20* 21*  ?GLUCOSE 97   < > 305*  --  347* 391* 386*  ?BUN 114*   < > 115*  --  105* 92* 80*  ?CREATININE 16.03*   < > 15.04*  --  14.55* 13.52* 12.76*  ?CALCIUM 6.5*   < > 6.3*  --  6.5* 6.9* 7.3*  ?MG 1.9  --   --   --   --   --   --   ?PHOS 10.8*  --  9.5*  --   --  8.7* 7.5*  ? < > = values in this interval not displayed.  ? ? ?GFR: ?Estimated Creatinine Clearance: 4.1 mL/min (A) (by C-G formula based on SCr of 12.76 mg/dL (H)). ? ?Liver Function Tests: ?Recent Labs  ?Lab 04/15/2021 ?1202 05/02/2021 ?0028 04/19/21 ?8588 04/20/21 ?0103 04/21/21 ?5027  ?AST 27 16  13*  --   --   ?ALT 15 17 15   --   --   ?ALKPHOS 171* 192* 170*  --   --   ?BILITOT 0.8 0.9 0.6  --   --   ?PROT 5.3* 4.0* 4.3*  --   --   ?ALBUMIN 2.0* <1.5* <1.5* <1.5* <1.5*  ? ? ? ?Cardiac Enzymes: ?  Recent Labs  ?Lab 05/01/2021 ?1202 04/17/21 ?0352 04/12/2021 ?0028  ?CKTOTAL 1,209* 526* 378*  ? ? ? ?HbA1C: ?Hemoglobin A1C  ?Date/Time Value Ref Range Status  ?02/29/2020 11:26 AM 13.9 (A) 4.0 - 5.6 % Final  ?11/24/2019 10:59 AM 10.3 (A) 4.0 - 5.6 % Final  ? ?HbA1c, POC (controlled diabetic range)  ?Date/Time Value Ref Range Status  ?02/26/2021 04:28 PM 12.2 (A) 0.0 - 7.0 % Final  ?11/18/2018 02:02 PM 7.3 (A) 0.0 - 7.0 % Final  ? ?Hgb A1c MFr Bld  ?Date/Time Value Ref Range Status  ?04/15/2021 12:09 PM >15.5 (H) 4.8 - 5.6 % Final  ?  Comment:  ?  (NOTE) ?**Verified by repeat analysis** ?        Prediabetes: 5.7 - 6.4 ?        Diabetes: >6.4 ?        Glycemic control for adults with diabetes: <7.0 ?  ?05/06/2019 12:22 PM 9.8 (H) 4.8 - 5.6 % Final  ?  Comment:  ?           Prediabetes: 5.7 - 6.4 ?         Diabetes: >6.4 ?         Glycemic control for adults with diabetes: <7.0 ?  ? ? ?CBG: ?Recent Labs  ?Lab 04/20/21 ?0606 04/20/21 ?1143 04/20/21 ?1556 04/20/21 ?2116 04/21/21 ?0617  ?GLUCAP 372* 246* 197* 299* 352*  ? ? ? ?Scheduled Meds: ? amiodarone  200 mg Oral BID  ? aspirin  81 mg Oral Daily  ? atorvastatin  80 mg Oral Daily  ? brimonidine  1 drop Left Eye BID  ? calcitRIOL  0.5 mcg Oral Daily  ? calcium acetate  2,001 mg Oral TID WC  ? gabapentin  100 mg Oral Daily  ? gentamicin cream  1 application. Topical Daily  ? heparin  5,000 Units Subcutaneous Q8H  ? insulin aspart  0-5 Units Subcutaneous QHS  ? insulin aspart  0-9 Units Subcutaneous TID WC  ? insulin glargine-yfgn  18 Units Subcutaneous Daily  ? loratadine  10 mg Oral Daily  ? sodium chloride flush  3 mL Intravenous Q12H  ? ?Continuous Infusions: ? sodium chloride    ? dialysis solution 2.5% low-MG/low-CA    ? ? ? LOS: 5 days  ? ?Cherene Altes,  MD ?[facility] ?Office  [phone] ?Pager - Text Page per Shea Evans ? ?If 7PM-7AM, please contact night-coverage per Amion ?04/21/2021, 8:18 AM ? ? ? ?

## 2021-04-21 NOTE — Progress Notes (Addendum)
?[facility] ?Progress Note  ? ?Subjective:   seen in room. No c/o today. Had PD overnight.  ? ?Objective ?Vitals:  ? 04/20/21 1626 04/20/21 1809 04/20/21 2328 04/21/21 0339  ?BP: (!) 113/40 125/76 137/84 (!) 162/82  ?Pulse: 92 93 88 95  ?Resp: 16 14 17 20   ?Temp: 98.5 ?F (36.9 ?C) 98 ?F (36.7 ?C) 98.1 ?F (36.7 ?C) 98.4 ?F (36.9 ?C)  ?TempSrc: Oral Temporal Oral Oral  ?SpO2: 95% 98% 99% 100%  ?Weight:    67.1 kg  ?Height:      ? ?Physical Exam ?General: alert female in NAD ?Heart: RRR, no murmurs, rubs or gallops ?Lungs: CTA bilaterally without wheezing, rhonchi or rales ?Abdomen: Soft, non-tender, non-distended, +BS ?Extremities: trace- 1+ edema left lower extremity ?Dialysis Access: PD cath in lower abd, clean and dry ? ? ? ?Dialysis Orders: ?Nephrologist: Dr. Joelyn Oms. RN: Melisa ?PD 7 days per week, EDW 62kg, 4 exchanges, Dwell time 1.5 hours, Fill volume 2.2L, no day exchanges ?2 exchanges with 1.5% dextrose and two exchanges with 2.5% dextrose ? ?Assessment/Plan: ? Generalized weakness/ fall/ AMS: due to severe new CHF and probable uremia as contributors. Mental status better now. Still remains weak. ?Severe systolic heart failure: new diagnosis, EF 21%. 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 Her BUN here was considerably high. She remains very opposed to hemodialysis and her old fistula is not functional. We have added a PD exchange here (4 > 5 per night). PD tonight. Also, as of today she is accepting to switching to HD.  Will consult IR for new TDC. Consider [facility] involvement for perm access while here if she does well w/ HD.  ? Hypertension/volume: hydralazine added for BP control, + low dose coreg and imdur per cardiology. Some mild edema on exam. Continue all 2.5 % fluids.  ? Anemia: Hgb elevated, no ESA indicated ? Metabolic bone disease: phos 10 > 7s.   Admits to issues with binder compliance. Continues on calcium acetate and calcitriol. CCa in range.  ? Nutrition:  Albumin quite low with poor intake. Renal/carb modified diet ?NSTEMI: Troponin elevated, per pmd ?T2DM: per pmd.  ?Dispo/ GOC - pt is DNR now. OK to change to HD as above which will make SNF placement possible.  ? ?Kelly Splinter, MD ?04/21/2021, 7:09 AM ? ?Recent Labs  ?Lab 04/19/21 ?9833 04/20/21 ?0103 04/21/21 ?8250  ?HGB 13.6  --  11.8*  ?ALBUMIN <1.5* <1.5* <1.5*  ?CALCIUM 6.5* 6.9* 7.3*  ?PHOS  --  8.7* 7.5*  ?CREATININE 14.55* 13.52* 12.76*  ?K 4.2 3.9 3.8  ? ? ? ? ?Medications: ? sodium chloride    ? dialysis solution 2.5% low-MG/low-CA    ? ? amiodarone  200 mg Oral BID  ? aspirin  81 mg Oral Daily  ? atorvastatin  80 mg Oral Daily  ? brimonidine  1 drop Left Eye BID  ? calcitRIOL  0.5 mcg Oral Daily  ? calcium acetate  2,001 mg Oral TID WC  ? gabapentin  100 mg Oral Daily  ? gentamicin cream  1 application. Topical Daily  ? heparin  5,000 Units Subcutaneous Q8H  ? insulin aspart  0-5 Units Subcutaneous QHS  ? insulin aspart  0-9 Units Subcutaneous TID WC  ? insulin glargine-yfgn  18 Units Subcutaneous Daily  ? loratadine  10 mg Oral Daily  ?  sodium chloride flush  3 mL Intravenous Q12H  ? ? ? ? ?

## 2021-04-21 NOTE — Progress Notes (Signed)
? ?Progress Note ? ?Patient Name: Ann Bryan ?Date of Encounter: 04/21/2021 ? ?[facility] Cardiologist: New ? ? ?Patient Profile  ?   ?  ?  ?KYNNADI DICENSO is a 61 y.o. female with a hx of HTN, diabetes, diabetic nephropathy that lead to ESRD on PD, HLD, anxiety/depression having present hypothermic ? Sepsis  seen 04/22/2021 for the evaluation of NSTEMI ?Echo 4/11 >> EF 21%  Bi Ventricular dysfunction>> non tamponading pericardial effusion  ?Cath  3 V CAD  LADm-75%: OM3-99%: CX-T; RCA-T. L>>R collaterals ?Tachycardia--SVT >> amio  ?Palliative care discussions re goals of care ? ? ?Subjective  ?No chest pain or shortness of breath.  Continues to complain of foot pain ? ?Inpatient Medications  ?  ?Scheduled Meds: ? amiodarone  200 mg Oral BID  ? aspirin  81 mg Oral Daily  ? atorvastatin  80 mg Oral Daily  ? brimonidine  1 drop Left Eye BID  ? calcitRIOL  0.5 mcg Oral Daily  ? calcium acetate  2,001 mg Oral TID WC  ? carvedilol  3.125 mg Oral BID WC  ? gabapentin  100 mg Oral Daily  ? gentamicin cream  1 application. Topical Daily  ? heparin  5,000 Units Subcutaneous Q8H  ? insulin aspart  0-5 Units Subcutaneous QHS  ? insulin aspart  0-9 Units Subcutaneous TID WC  ? insulin aspart  3 Units Subcutaneous TID WC  ? insulin glargine-yfgn  18 Units Subcutaneous Daily  ? loratadine  10 mg Oral Daily  ? ?Continuous Infusions: ? dialysis solution 2.5% low-MG/low-CA    ? ?PRN Meds: ?acetaminophen, calcium acetate, hydrALAZINE, HYDROmorphone (DILAUDID) injection, nitroGLYCERIN, ondansetron (ZOFRAN) IV, prochlorperazine  ? ?Vital Signs  ?  ?Vitals:  ? 04/20/21 1809 04/20/21 2328 04/21/21 0339 04/21/21 0900  ?BP: 125/76 137/84 (!) 162/82 (!) 147/68  ?Pulse: 93 88 95 (!) 104  ?Resp: 14 17 20 16   ?Temp: 98 ?F (36.7 ?C) 98.1 ?F (36.7 ?C) 98.4 ?F (36.9 ?C) 99.1 ?F (37.3 ?C)  ?TempSrc: Temporal Oral Oral Oral  ?SpO2: 98% 99% 100% 99%  ?Weight:   67.1 kg   ?Height:      ? ? ?Intake/Output Summary (Last 24 hours) at 04/21/2021  0954 ?Last data filed at 04/21/2021 0915 ?Gross per 24 hour  ?Intake 443 ml  ?Output --  ?Net 443 ml  ? ? ? ?  04/21/2021  ?  3:39 AM 04/20/2021  ?  6:08 AM 04/19/2021  ?  5:10 PM  ?Last 3 Weights  ?Weight (lbs) 147 lb 14.9 oz 138 lb 14.2 oz 143 lb 4.8 oz  ?Weight (kg) 67.1 kg 63 kg 65 kg  ?   ? ?Telemetry  ?  ?Sinus rhythm.- Personally Reviewed ? ?ECG  ?  ?No new  - Personally Reviewed ? ?Physical Exam  ?Well developed and nourished in no acute distress ?HENT normal ?Neck supple   ?Clear ?Regular rate and rhythm, no murmurs or gallops ?Abd-soft   ?No Clubbing cyanosis edema ?Skin-warm and dry ?A & Oriented  Grossly normal sensory and motor function ? ?   ? ?Labs  ?  ?High Sensitivity Troponin:   ?Recent Labs  ?Lab 04/30/2021 ?1202 05/03/2021 ?2003 04/17/21 ?0352 04/17/21 ?0500 04/20/21 ?0921  ?TROPONINIHS 2,296* 1,704* 2,531* 2,204* 3,811*  ? ?   ?Chemistry ?Recent Labs  ?Lab 04/10/2021 ?1202 05/05/2021 ?1234 04/10/2021 ?0028 04/29/2021 ?1139 04/19/21 ?0932 04/20/21 ?0103 04/21/21 ?3557  ?NA 132*   < > 129*   < > 128* 132* 132*  ?  K 4.7   < > 4.1   < > 4.2 3.9 3.8  ?CL 93*   < > 95*  --  91* 96* 96*  ?CO2 13*   < > 15*  --  18* 20* 21*  ?GLUCOSE 97   < > 305*  --  347* 391* 386*  ?BUN 114*   < > 115*  --  105* 92* 80*  ?CREATININE 16.03*   < > 15.04*  --  14.55* 13.52* 12.76*  ?CALCIUM 6.5*   < > 6.3*  --  6.5* 6.9* 7.3*  ?MG 1.9  --   --   --   --   --   --   ?PROT 5.3*  --  4.0*  --  4.3*  --   --   ?ALBUMIN 2.0*  --  <1.5*  --  <1.5* <1.5* <1.5*  ?AST 27  --  16  --  13*  --   --   ?ALT 15  --  17  --  15  --   --   ?ALKPHOS 171*  --  192*  --  170*  --   --   ?BILITOT 0.8  --  0.9  --  0.6  --   --   ?GFRNONAA 2*   < > 3*  --  3* 3* 3*  ?ANIONGAP 26*   < > 19*  --  19* 16* 15  ? < > = values in this interval not displayed.  ? ?  ?Lipids No results for input(s): CHOL, TRIG, HDL, LABVLDL, LDLCALC, CHOLHDL in the last 168 hours.  ?Hematology ?Recent Labs  ?Lab 04/06/2021 ?0028 04/14/2021 ?1139 04/22/2021 ?1147 04/19/21 ?3846  04/21/21 ?6599  ?WBC 11.4*  --   --  12.5* 11.9*  ?RBC 4.64  --   --  4.81 4.25  ?HGB 12.9   < > 12.2 13.6 11.8*  ?HCT 39.2   < > 36.0 40.3 36.2  ?MCV 84.5  --   --  83.8 85.2  ?MCH 27.8  --   --  28.3 27.8  ?MCHC 32.9  --   --  33.7 32.6  ?RDW 13.5  --   --  13.6 13.7  ?PLT 287  --   --  304 413*  ? < > = values in this interval not displayed.  ? ? ?Thyroid  ?Recent Labs  ?Lab 04/12/2021 ?1202  ?TSH 0.724  ? ?  ?BNPNo results for input(s): BNP, PROBNP in the last 168 hours.  ?DDimer No results for input(s): DDIMER in the last 168 hours.  ? ?Radiology  ?  ?No results found. ? ?Cardiac Studies  ? ? ?CONCLUSIONS: ?Probable mixed ischemic and nonischemic systolic heart failure, compensated with LVEDP normal but with elevated capillary wedge pressure. ?Hemodynamics/cardiac output suggest cardiogenic shock although measurements were made in the setting of intermittent supraventricular tachycardia at which time systolic blood pressures would be less than 80 mmHg. ?Three-vessel coronary artery disease with 75% mid LAD, 99% mid third obtuse marginal, total occlusion of the circumflex beyond the third obtuse marginal, total occlusion of the dominant right coronary proximally.  There are left-to-right collaterals. ?  ?RECOMMENDATIONS: ?  ?Severe calcification of coronary arteries and very low ejection fraction and blood pressure make PCI unappealing.  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 ?Recommend advanced heart failure team consultation. ?Prognosis and treatment strategy are limited given end-stage renal disease. ?Echo 04/20/2021 ? 1. Left ventricular ejection fraction by 3D volume is 21 %.  The left  ?ventricle has severely decreased function. The left ventricle demonstrates  ?global hypokinesis. Left ventricular diastolic parameters are consistent  ?with Grade I diastolic dysfunction  ?(impaired relaxation). Elevated left ventricular end-diastolic pressure.  ? 2. Right ventricular systolic function is  moderately reduced. The right  ?ventricular size is mildly enlarged. There is normal pulmonary artery  ?systolic pressure.  ? 3. Left atrial size was mildly dilated.  ? 4. There is small to moderate circumferential pericardial effusion. There  ?is no evidence of cardiac tamponade.  ? 5. The mitral valve is normal in structure. Mild mitral valve  ?regurgitation. No evidence of mitral stenosis.  ? 6. The aortic valve is normal in structure. Aortic valve regurgitation is  ?not visualized. No aortic stenosis is present.  ? 7. The inferior vena cava is normal in size with greater than 50%  ?respiratory variability, suggesting right atrial pressure of 3 mmHg.  ? ?Comparison(s): No prior Echocardiogram.  ? ? ?Assessment & Plan  ?CAD-3V not amenable to revascularization ? ?NSTEMI ?Elevated troponin but in a pattern inconsistent with non-STEMI ? ?HFrEF ? ?ESRD on PD ? ?Hypoalbuminemia ? ?SVT ?  ?Not sure of the diagnosis of non-STEMI.  Certainly her temporal pattern is inconsistent with MRI.  The literature that I have been able to review suggest 1-troponin is elevated and peritoneal dialysis although perhaps to a lesser degree than HD, 2-TNI as opposed to TNT does not have prognostic value. ? ?I think in this situation, in the absence of being able to clearly implicate an MI not withstanding her three-vessel disease I do not find data to support the use of DAPT.  Hence, would continue her on aspirin and not add clopidogrel.  This can be reviewed with the interventional team in the morning ?  ?With elevated blood pressure we will begin hydralazine 25 every 8 and isosorbide mononitrate 15 mg daily.  This can be further uptitrated as blood pressure allows ? ?Continue carvedilol with her cardiomyopathy ? ?Continue amiodarone for now ? ? ?  ? ?Virl Axe MD  ? ? ? ?For questi2ns or updates, please contact [place] ?Please consult [email or web address] for contact info under  ? ?  ?   ?Signed, ?Virl Axe, MD  ?04/21/2021, 9:54  AM   ? ?

## 2021-04-21 NOTE — Consult Note (Signed)
? ?Chief Complaint: ?Patient was seen in consultation today for tunneled dialysis catheter placement ?Chief Complaint  ?Patient presents with  ? Weakness  ? at the request of Dr Mickel Crow ? ?Supervising Physician: Daryll Brod ? ?Patient Status: [facility] - In-pt ? ?History of Present Illness: ?Ann Bryan is a 61 y.o. female  ? ?New dx CHF ?Uremia; AMS (now improved) ?ESRD was on PD--- may not be reliable on home PD per chart ?Labs are high ?Has been opposing HD-- but now has consented to hemodialysis ?Has older left arm dialysis fistula (01/2019) ?Peritoneal dialysis catheter in place ? ?Request per Nephrology to place tunneled catheter 4/17 in IR ? ?Past Medical History:  ?Diagnosis Date  ? Anemia   ? Cataract   ? right eye  ? Complication of anesthesia   ?  " I woke up itching from anesthesia many years ago."   ? Depression   ? at times  ? Diabetes mellitus 1986  ?  poorly controlled last hemoglobin A1c  10.5 on January 01, 2010, increase since June 2011 when hemoglobin A1c was 9.3 , determined to be most likely secondary to non-compliance.  ? Diabetic nephropathy ([place])   ? Diabetic retinopathy   ? Dyspnea   ? "when I have too much fluid"  ? End stage renal disease on dialysis [facility])   ? T/Th/Sat [address]  ? Glaucoma   ? History of blood transfusion   ? Hyperlipidemia   ? Hypertension   ? Nausea and vomiting   ? Neuropathy   ? Retinopathy   ? Wears glasses   ? ? ?Past Surgical History:  ?Procedure Laterality Date  ? ABDOMINAL HYSTERECTOMY   2004  ? AV FISTULA PLACEMENT Left 01/21/2019  ? Procedure: BRACHIOCEPHALIC ARTERIOVENOUS (AV) FISTULA CREATION LEFT ARM;  Surgeon: Serafina Mitchell, MD;  Location: [facility] OR;  Service: Vascular;  Laterality: Left;  ? EYE SURGERY    ? FISTULA SUPERFICIALIZATION Left 05/20/2019  ? Procedure: FISTULA SUPERFICIALIZATION LEFT PROXIMAL FISTULA;  Surgeon: Serafina Mitchell, MD;  Location: [place];  Service: Vascular;  Laterality: Left;  ? IR FLUORO GUIDE CV LINE RIGHT  11/15/2018  ? IR US  GUIDE VASC ACCESS RIGHT  11/15/2018  ? MYOMECTOMY  1994  ? REFRACTIVE SURGERY Bilateral   ? RIGHT/LEFT HEART CATH AND CORONARY ANGIOGRAPHY N/A 04/17/2021  ? Procedure: RIGHT/LEFT HEART CATH AND CORONARY ANGIOGRAPHY;  Surgeon: Belva Crome, MD;  Location: [place] CV LAB;  Service: Cardiovascular;  Laterality: N/A;  ? ? ?Allergies: ?Doxycycline, Victoza [liraglutide], and Chlorhexidine ? ?Medications: ?Prior to Admission medications   ?Medication Sig Start Date End Date Taking? Authorizing Provider  ?amLODipine (NORVASC) 10 MG tablet Take 1 tablet (10 mg total) by mouth daily. Must have office visit for refills 01/03/20  Yes Newlin, Charlane Ferretti, MD  ?atorvastatin (LIPITOR) 20 MG tablet Take 1 tablet by mouth daily. ?Patient taking differently: Take 20 mg by mouth in the morning, at noon, and at bedtime. 11/30/19  Yes Ladell Pier, MD  ?brimonidine (ALPHAGAN) 0.2 % ophthalmic solution Place 1 drop into the left eye 2 (two) times daily. 01/24/21  Yes [provider]  ?calcitRIOL (ROCALTROL) 0.5 MCG capsule Take 0.5 mcg by mouth daily. 03/11/21  Yes [provider]  ?calcium acetate (PHOSLO) 667 MG capsule Take 667-2,001 mg by mouth See admin instructions. 2,001 mg with meals 3 times daily and 667 mg 2 times daily with snacks   Yes [provider]  ?carvedilol (COREG) 25 MG  tablet Take 1 and 1/2 tablets by mouth twice daily with a meal. Must have office visit for refills ?Patient taking differently: Take 37.5 mg by mouth 2 (two) times daily with a meal. Must have office visit for refills 12/26/19  Yes Ladell Pier, MD  ?cloNIDine (CATAPRES) 0.2 MG tablet Take 0.2 mg by mouth 3 (three) times daily. 06/27/20  Yes [provider]  ?gabapentin (NEURONTIN) 100 MG capsule 1 cap PO every other day ?Patient taking differently: 100 mg every other day. 02/26/21  Yes Ladell Pier, MD  ?hydrALAZINE (APRESOLINE) 100 MG tablet Take 100 mg by mouth in the morning, at noon, and at  bedtime. 01/24/21  Yes [provider]  ?insulin aspart (NOVOLOG FLEXPEN) 100 UNIT/ML FlexPen 7 units subcut with meals ?Patient taking differently: 0-10 Units 3 (three) times daily with meals. Dose per sliding scale 02/26/21  Yes Ladell Pier, MD  ?insulin glargine (LANTUS SOLOSTAR) 100 UNIT/ML Solostar Pen Inject 30 Units into the skin daily. 02/26/21  Yes Ladell Pier, MD  ?losartan (COZAAR) 100 MG tablet Take 100 mg by mouth daily. 01/31/21  Yes [provider]  ?Blood Glucose Monitoring Suppl (TRUE METRIX METER) w/Device KIT 1 each by Does not apply route as needed. 11/18/18   Ladell Pier, MD  ?Continuous Blood Gluc Receiver (DEXCOM G6 RECEIVER) DEVI Use as instructed to check blood sugar daily 03/29/20   Shamleffer, Melanie Crazier, MD  ?Continuous Blood Gluc Sensor (DEXCOM G6 SENSOR) MISC 1 Device by Does not apply route as directed. 02/29/20   Shamleffer, Melanie Crazier, MD  ?Continuous Blood Gluc Transmit (DEXCOM G6 TRANSMITTER) MISC 1 Device by Does not apply route as directed. 02/29/20   Shamleffer, Melanie Crazier, MD  ?glucose blood (ONETOUCH VERIO) test strip Use as instructed to check blood sugar 3 times daily Dx is E11.65 03/23/20   Shamleffer, Melanie Crazier, MD  ?Insulin Pen Needle 32G X 4 MM MISC 1 Device by Does not apply route in the morning, at noon, in the evening, and at bedtime. 08/31/20   Shamleffer, Melanie Crazier, MD  ?Insulin Syringe-Needle U-100 (INSULIN SYRINGE 1CC/31GX5/16") 31G X 5/16" 1 ML MISC Use as directed.  BD 66m x 31G needles 01/22/17   JLadell Pier MD  ?Insulin Syringes, Disposable, U-100 0.5 ML MISC 1 each by Does not apply route 2 (two) times daily. 01/30/14   FBoykin Nearing MD  ?lidocaine-prilocaine (EMLA) cream See admin instructions. APPLY SMALL AMOUNT TO ACCESS SITE (AVF) 1 TO 2 HOURS BEFORE DIALYSIS. COVER WITH OCCLUSIVE DRESSING (SARAN WRAP) 03/19/21   [provider]  ?TRUEplus Lancets 28G MISC 1 each by Does not apply  route 3 (three) times daily. 12/20/20   Shamleffer, IMelanie Crazier MD  ?  ? ?Family History  ?Problem Relation Age of Onset  ? Diabetes Mother   ? Stroke Mother   ? Hypertension Mother   ? Alcohol abuse Mother   ? Depression Father   ?     Committed suicide  ? Alcohol abuse Maternal Uncle   ? ? ?Social History  ? ?Socioeconomic History  ? Marital status: Single  ?  Spouse name: Not on file  ? Number of children: Not on file  ? Years of education: Not on file  ? Highest education level: Not on file  ?Occupational History  ? Not on file  ?Tobacco Use  ? Smoking status: Never  ? Smokeless tobacco: Never  ?Vaping Use  ? Vaping Use: Never used  ?Substance and  Sexual Activity  ? Alcohol use: No  ? Drug use: No  ? Sexual activity: Not Currently  ?Other Topics Concern  ? Not on file  ?Social History Narrative  ? Not on file  ? ?Social Determinants of Health  ? ?Financial Resource Strain: Not on file  ?Food Insecurity: Not on file  ?Transportation Needs: Not on file  ?Physical Activity: Not on file  ?Stress: Not on file  ?Social Connections: Not on file  ? ? ?Review of Systems: A 12 point ROS discussed and pertinent positives are indicated in the HPI above.  All other systems are negative. ? ?Vital Signs: ?BP (!) 159/86 (BP Location: Right Arm)   Pulse (!) 104   Temp 99.3 ?F (37.4 ?C) (Oral)   Resp 20 Comment: inaccurate redaing previous documentation  Ht 5' (1.524 m)   Wt 147 lb 14.9 oz (67.1 kg)   LMP 03/13/2002   SpO2 99%   BMI 28.89 kg/m?  ? ?Physical Exam ?Vitals reviewed.  ?HENT:  ?   Mouth/Throat:  ?   Mouth: Mucous membranes are moist.  ?Cardiovascular:  ?   Rate and Rhythm: Regular rhythm. Tachycardia present.  ?Pulmonary:  ?   Effort: Pulmonary effort is normal.  ?   Breath sounds: Normal breath sounds.  ?Abdominal:  ?   Palpations: Abdomen is soft.  ?Musculoskeletal:     ?   General: Normal range of motion.  ?   Comments: Left upper arm fistula ?No pulse ?No thrill  ?Skin: ?   General: Skin is warm.   ?Neurological:  ?   Mental Status: She is alert and oriented to person, place, and time.  ?Psychiatric:     ?   Behavior: Behavior normal.  ? ? ?Imaging: ?DG Chest 2 View ? ?Result Date: 03/28/2021 ?CLINICAL DATA:  [place]

## 2021-04-21 NOTE — Progress Notes (Signed)
? ?Palliative Medicine Inpatient Follow Up Note ? ? ?HPI: ?61 year old with a history of DM2, HTN, HLD, diabetic neuropathy, and ESRD on PD who presented to the ED with severe generalized weakness for approximately 6 to 7 days and an eventual pre-syncopal spell resulting in a fall without loss of consciousness.  After her fall she was too weak to get up and spent the night on the floor, thusly missing her PD.  A neighbor called EMS who arrived to find the patient on the floor with stable vital signs and a normal CBG.  In the ED the patient was hypothermic with temperature 95.4.  WBC was borderline at 10. ?  ?Palliative care has been asked to get involved to discuss goals of care in the setting of multiple co-morbidities.  ? ?Today's Discussion (04/21/2021): ? ?*Please note that this is a verbal dictation therefore any spelling or grammatical errors are due to the "[place] One" system interpretation. ? ?Chart reviewed inclusive of vital signs, progress notes, laboratory results, and diagnostic images.  ? ?I met with Azalea at bedside this afternoon. She is alert and oriented again though a bit more groggy as she had received dilaudid prior to my arrival due to L foot pain.  ? ?I shared with Parisa that I had learned unfortunately she would not be able to go to a skilled facility on PD - even though it is her own machine and she has been doing the change outs for three years now. We reviewed that in order to move forward there are two options - the first would be to pursue iHD which would be a three times weekly. Discussed that we don't know how her tolerance would be presently. The other option would be to move in a comfort hospice directions. I described hospice as a service for patients who have a life expectancy of 6 months or less. The goal of hospice is the preservation of dignity and quality at the end phases of life. Under hospice care, the focus changes from curative to symptom relief.  ? ?Shawntavia shares that  she has thought more about HD and spoken to her niece Tommie Sams about it. She shares the most troubling symptom on this was itching. We reviewed different strategies to combat that. Camyra expresses not feeling readiness to die yet. WE reviewed that if she should start HD and is unable to tolerate it or just decides she no longer wishes to continue at that time she could transition goals. For now Ellar expresses the desire to try hemodialysis.  ? ?Questions and concerns addressed  ? ?Palliative Support Provided ? ?Objective Assessment: ?Vital Signs ?Vitals:  ? 04/21/21 0900 04/21/21 1135  ?BP: (!) 147/68 (!) 159/86  ?Pulse: (!) 104   ?Resp: 16 20  ?Temp: 99.1 ?F (37.3 ?C) 99.3 ?F (37.4 ?C)  ?SpO2: 99%   ? ? ?Intake/Output Summary (Last 24 hours) at 04/21/2021 1421 ?Last data filed at 04/21/2021 1021 ?Gross per 24 hour  ?Intake 203 ml  ?Output 11029 ml  ?Net -10826 ml  ? ? ?Last Weight  Most recent update: 04/21/2021  6:13 AM  ? ? Weight  ?67.1 kg (147 lb 14.9 oz)  ?      ? ?  ? ?Gen:  Older AA F in NAD ?HEENT: moist mucous membranes ?CV: irregular rate and rhythm ?PULM: On RA, breathing nonlabored ?ABD: soft/nontender ?EXT: No edema  ?Neuro: Disoriented ? ?SUMMARY OF RECOMMENDATIONS   ?DNAR/DNI ? ?Plan to continue current care ? ?Appreciate  nephrology calling patients niece Tommie Sams for an update ? ?Patient plans to go to SNF though does now know where she would go thereafter ? ?Patient has changed her mind and is now open to trying hemodialysis ? ?Ongoing PMT support ? ?MDM - High ?______________________________________________________________________________________ ?Tacey Ruiz ?[facility] Team ?Team Cell Phone: [phone] ?Please utilize secure chat with additional questions, if there is no response within 30 minutes please call the above phone number ? ?Palliative Medicine Team providers are available by phone from 7am to 7pm daily and can be reached through the team cell phone.  ?Should  this patient require assistance outside of these hours, please call the patient's attending physician. ? ? ? ? ?

## 2021-04-22 ENCOUNTER — Inpatient Hospital Stay (HOSPITAL_COMMUNITY): Payer: Medicare Other

## 2021-04-22 ENCOUNTER — Telehealth: Payer: Self-pay

## 2021-04-22 DIAGNOSIS — E785 Hyperlipidemia, unspecified: Secondary | ICD-10-CM

## 2021-04-22 DIAGNOSIS — R778 Other specified abnormalities of plasma proteins: Secondary | ICD-10-CM

## 2021-04-22 DIAGNOSIS — I5022 Chronic systolic (congestive) heart failure: Secondary | ICD-10-CM

## 2021-04-22 DIAGNOSIS — N186 End stage renal disease: Secondary | ICD-10-CM

## 2021-04-22 DIAGNOSIS — E113599 Type 2 diabetes mellitus with proliferative diabetic retinopathy without macular edema, unspecified eye: Secondary | ICD-10-CM

## 2021-04-22 DIAGNOSIS — Z992 Dependence on renal dialysis: Secondary | ICD-10-CM | POA: Diagnosis not present

## 2021-04-22 DIAGNOSIS — I5021 Acute systolic (congestive) heart failure: Secondary | ICD-10-CM | POA: Insufficient documentation

## 2021-04-22 DIAGNOSIS — I132 Hypertensive heart and chronic kidney disease with heart failure and with stage 5 chronic kidney disease, or end stage renal disease: Secondary | ICD-10-CM

## 2021-04-22 DIAGNOSIS — Z794 Long term (current) use of insulin: Secondary | ICD-10-CM

## 2021-04-22 DIAGNOSIS — N185 Chronic kidney disease, stage 5: Secondary | ICD-10-CM

## 2021-04-22 DIAGNOSIS — I214 Non-ST elevation (NSTEMI) myocardial infarction: Secondary | ICD-10-CM | POA: Diagnosis not present

## 2021-04-22 HISTORY — PX: IR FLUORO GUIDE CV LINE RIGHT: IMG2283

## 2021-04-22 HISTORY — PX: IR US GUIDE VASC ACCESS RIGHT: IMG2390

## 2021-04-22 LAB — GLUCOSE, CAPILLARY
Glucose-Capillary: 141 mg/dL — ABNORMAL HIGH (ref 70–99)
Glucose-Capillary: 88 mg/dL (ref 70–99)
Glucose-Capillary: 265 mg/dL — ABNORMAL HIGH (ref 70–99)

## 2021-04-22 LAB — RENAL FUNCTION PANEL
Albumin: <1.5 g/dL — ABNORMAL LOW (ref 3.5–5.0)
Anion gap: 15 (ref 5–15)
BUN: 80 mg/dL — ABNORMAL HIGH (ref 6–20)
CO2: 22 mmol/L (ref 22–32)
Calcium: 8.1 mg/dL — ABNORMAL LOW (ref 8.9–10.3)
Chloride: 98 mmol/L (ref 98–111)
Creatinine, Ser: 13.05 mg/dL — ABNORMAL HIGH (ref 0.44–1.00)
GFR, Estimated: 3 mL/min — ABNORMAL LOW (ref >60)
Glucose, Bld: 154 mg/dL — ABNORMAL HIGH (ref 70–99)
Phosphorus: 6.9 mg/dL — ABNORMAL HIGH (ref 2.5–4.6)
Potassium: 3.6 mmol/L (ref 3.5–5.1)
Sodium: 135 mmol/L (ref 135–145)

## 2021-04-22 LAB — CBC
HCT: 37 % (ref 36.0–46.0)
Hemoglobin: 12 g/dL (ref 12.0–15.0)
MCH: 28.2 pg (ref 26.0–34.0)
MCHC: 32.4 g/dL (ref 30.0–36.0)
MCV: 86.9 fL (ref 80.0–100.0)
Platelets: 421 10*3/uL — ABNORMAL HIGH (ref 150–400)
RBC: 4.26 MIL/uL (ref 3.87–5.11)
RDW: 13.8 % (ref 11.5–15.5)
WBC: 12.9 10*3/uL — ABNORMAL HIGH (ref 4.0–10.5)
nRBC: 0 % (ref 0.0–0.2)

## 2021-04-22 LAB — URIC ACID: Uric Acid, Serum: 8.4 mg/dL — ABNORMAL HIGH (ref 2.5–7.1)

## 2021-04-22 LAB — HEPATITIS B SURFACE ANTIBODY,QUALITATIVE: Hep B S Ab: REACTIVE — AB

## 2021-04-22 LAB — HEPATITIS B SURFACE ANTIGEN: Hepatitis B Surface Ag: NONREACTIVE

## 2021-04-22 MED ORDER — HEPARIN SODIUM (PORCINE) 1000 UNIT/ML IJ SOLN
INTRAMUSCULAR | Status: AC
  Administered 2021-04-22: 3.2 mL
  Filled 2021-04-22: qty 10

## 2021-04-22 MED ORDER — TRAMADOL HCL 50 MG PO TABS
50.0000 mg | ORAL_TABLET | Freq: Two times a day (BID) | ORAL | Status: DC | PRN
Start: 2021-04-22 — End: 2021-04-24

## 2021-04-22 MED ORDER — HYDROMORPHONE HCL 1 MG/ML IJ SOLN: 0.5000 mg | INTRAMUSCULAR | Status: DC | PRN

## 2021-04-22 MED ORDER — HEPARIN SODIUM (PORCINE) 1000 UNIT/ML IJ SOLN
INTRAMUSCULAR | Status: AC
  Filled 2021-04-22: qty 4

## 2021-04-22 MED ORDER — GABAPENTIN 100 MG PO CAPS
100.0000 mg | ORAL_CAPSULE | Freq: Two times a day (BID) | ORAL | Status: DC
  Administered 2021-04-22 – 2021-04-23 (×2): 100 mg via ORAL
  Filled 2021-04-22 (×2): qty 1

## 2021-04-22 MED ORDER — OXYCODONE HCL 5 MG PO TABS
5.0000 mg | ORAL_TABLET | ORAL | Status: DC | PRN
  Administered 2021-04-22: 5 mg via ORAL
  Filled 2021-04-22: qty 1

## 2021-04-22 MED ORDER — CEFAZOLIN SODIUM-DEXTROSE 2-4 GM/100ML-% IV SOLN
INTRAVENOUS | Status: AC
  Filled 2021-04-22: qty 100

## 2021-04-22 MED ORDER — HYDROMORPHONE HCL 1 MG/ML IJ SOLN
0.5000 mg | INTRAMUSCULAR | Status: DC | PRN
  Administered 2021-04-22: 1 mg via INTRAVENOUS
  Filled 2021-04-22: qty 1

## 2021-04-22 MED ORDER — MIDAZOLAM HCL 2 MG/2ML IJ SOLN
INTRAMUSCULAR | Status: AC
  Filled 2021-04-22: qty 2

## 2021-04-22 MED ORDER — HYDROMORPHONE HCL 1 MG/ML IJ SOLN
0.5000 mg | INTRAMUSCULAR | Status: DC | PRN
  Administered 2021-04-22 – 2021-04-23 (×6): 1 mg via INTRAVENOUS
  Administered 2021-04-23: 0.5 mg via INTRAVENOUS
  Administered 2021-04-24 (×4): 1 mg via INTRAVENOUS
  Filled 2021-04-22 (×3): qty 1
  Filled 2021-04-22: qty 0.5
  Filled 2021-04-22 (×9): qty 1

## 2021-04-22 MED ORDER — LIDOCAINE-EPINEPHRINE 1 %-1:100000 IJ SOLN
INTRAMUSCULAR | Status: AC
  Administered 2021-04-22: 20 mL
  Filled 2021-04-22: qty 1

## 2021-04-22 MED ORDER — NYSTATIN 100000 UNIT/GM EX POWD
Freq: Three times a day (TID) | CUTANEOUS | Status: DC
  Administered 2021-04-23: 1 via TOPICAL
  Filled 2021-04-22 (×2): qty 15

## 2021-04-22 MED ORDER — DELFLEX-LC/1.5% DEXTROSE 344 MOSM/L IP SOLN: INTRAPERITONEAL | Status: DC

## 2021-04-22 MED ORDER — COLCHICINE 0.3 MG HALF TABLET
0.3000 mg | ORAL_TABLET | Freq: Every day | ORAL | Status: DC
  Administered 2021-04-22 – 2021-04-24 (×3): 0.3 mg via ORAL
  Filled 2021-04-22 (×4): qty 1

## 2021-04-22 NOTE — Progress Notes (Addendum)
This chaplain responded to PMT referral for notarizing the Pt. HCPOA. The chaplain understands the HCPOA is filled out and in the Pt. chart. ? ?The Pt. is out of the room at the time of the chaplain's visit. The chaplain understands the Pt. is in IR. The chaplain is appreciative of RN-Barbara page when the Pt. returns. ? ?**1202 The chaplain is appreciative of the unit communication. The chaplain understands the Pt. will be away from the room for at least 5 hours, because the Pt. will go to HD after IR. This chaplain will F/U on Tuesday. ? ?Chaplain Sallyanne Kuster ?[phone] ?

## 2021-04-22 NOTE — Progress Notes (Addendum)
RN took over patient care  ? ?

## 2021-04-22 NOTE — Procedures (Signed)
Interventional Radiology Procedure Note ? ?Procedure: Image guided tunneled hemodialysis catheter placement ? ?Findings: Please refer to procedural dictation for full description. 19 cm right IJ tunneled HD, tip in right atrium. ? ?Complications: None immediate ? ?Estimated Blood Loss: < 5 mL ? ?Recommendations: ?Catheter ready for immediate use. ? ? ?Ruthann Cancer, MD ? ? ?

## 2021-04-22 NOTE — Telephone Encounter (Signed)
Veneta Penton, PA with [facility] called and left a message wanting our Drs to see this patient while in-patient.  Says pt was on PD but is failing and will need to switch her to hemodialysis.  Will use Catheter instead.  Wants patient to have a new fistula placed while there.  Pt has an old failed fistula.  Dr. Donzetta Matters and [facility] Lanius, [place] were paged. ?

## 2021-04-22 NOTE — Plan of Care (Signed)
?  Problem: Clinical Measurements: ?Goal: Will remain free from infection ?Outcome: Progressing ?Goal: Respiratory complications will improve ?Outcome: Progressing ?  ?Problem: Education: ?Goal: Knowledge of General Education information will improve ?Description: Including pain rating scale, medication(s)/side effects and non-pharmacologic comfort measures ?Outcome: Not Progressing ?  ?Problem: Health Behavior/Discharge Planning: ?Goal: Ability to manage health-related needs will improve ?Outcome: Not Progressing ?  ?Problem: Clinical Measurements: ?Goal: Ability to maintain clinical measurements within normal limits will improve ?Outcome: Not Progressing ?Goal: Diagnostic test results will improve ?Outcome: Not Progressing ?  ?

## 2021-04-22 NOTE — Progress Notes (Signed)
Patient via bed to IR for tunneled catheter ?

## 2021-04-22 NOTE — TOC CM/SW Note (Signed)
HF TOC CM/CSW signed off and handoff given to Unit CM/CSW to continue to follow up. Jonnie Finner RN3 CCM, Heart Failure TOC CM [phone]  ?

## 2021-04-22 NOTE — Progress Notes (Signed)
Patient directly to HD after HD catheter placement, writer to HD to get consent signed by patient ?

## 2021-04-22 NOTE — Plan of Care (Signed)

## 2021-04-22 NOTE — Progress Notes (Signed)
PT Cancellation Note ? ?Patient Details ?Name: Ann Bryan ?MRN: [id number] ?DOB: [birth year] ? ? ?Cancelled Treatment:    Reason Eval/Treat Not Completed: Patient at procedure or test/unavailable Pt off the floor at HD. Will follow. ? ? ?[place] ?04/22/2021, 2:28 PM ?Marisa Severin, PT, DPT ?Acute Rehabilitation Services ?Secure chat preferred ?Office [phone] ? ? ? ?

## 2021-04-22 NOTE — Progress Notes (Signed)
? Ann Bryan  KDT:[id number] DOB: [birth year] DOA: 04/27/2021 ?PCP: Ladell Pier, MD   ? ?Brief Narrative:  ?82yowith a history of DM2, HTN, HLD, diabetic neuropathy, and ESRD on PD who presented to the ED with severe generalized weakness for approximately 6-7 days and an eventual pre-syncopal spell resulting in a fall without loss of consciousness.  After her fall she was too weak to get up and spent the night on the floor, thusly missing her PD.  A neighbor called EMS who arrived to find the patient on the floor with stable vital signs and a normal CBG.  In the ED the patient was hypothermic with temperature 95.4.  WBC was borderline at 10. ? ?Consultants:  ?Cardiology - CHF Team  ?Nephrology ?Palliative Care  ? ?Code Status: NO CODE BLUE ? ?DVT prophylaxis: ?Subcutaneous heparin ? ?Interim Hx: ?Afebrile.  Vital signs stable.  Complains of ongoing pain in her feet, and appears not to remember our discussion about it yesterday.  CBG reasonably controlled.  Patient is complaining of uncontrolled bilateral foot pain which she states "no one has done anything about the whole time she has been here."  I reminded her that she has been treated with IV pain medication, and that 48 hours ago she actually explained to me that her pain was essentially resolved thanks to that pain medication.  I also reminded her that we spoke at length yesterday when her pain recurred and stated that I would check a uric acid and look into it further.  We also discussed at that time that there was no specific imaging that would likely prove beneficial in explaining her pain in the absence of evidence of vascular compromise/ischemia on physical exam.  At the time of my visit today she is alert but mildly confused.  She clearly does not remember conversations we have had over the last 2 to 3 days. ? ?Assessment & Plan: ? ?ESRD on PD > HD ?It appears patient was not likely consistently compliant with PD at home -nonetheless, in that she  is no longer safe to live independently, she has been transitioned to HD -she was initially resistant to this but has now agreed -HD cath placed in IR in right chest today with first treatment and HD 4/17 tolerated well ? ?Newly diagnosed severe systolic CHF ?TTE noted EF 21% w/ severely decreased LV systolic fxn -cardiac cath 4/13 suggests combination of ischemic and nonischemic etiologies - volume control per HD - advanced heart failure team reports no real options for intervention beyond titration of medical care ? ?3 vessel calcific CAD ?Cardiology has evaluated- initial troponin at presentation quite elevated at 2296 - cardiac cath has revealed signif 3 vessel disease that is not amenable to intervention -continue low-dose BB ? ?Near syncope / fall / generalized weakness - severe failure to thrive ?due to severe systolic CHF and persistent uremia  ? ?B foot pain  ?Likely neuropathic in origin -physical exam without any evidence of ischemia/diminished blood flow -began on the right but now patient reports is present bilaterally since 4/16 -uric acid mildly elevated but exam nonfocal and not suggestive of an acute gout flare -adjust pain medication and follow -increase Neurontin though must be careful with this medication in the setting of ESRD ? ?Bursts of SVT ?Documented while patient in cardiac Cath Lab -appear to last less than 1 minute but are associated with blood pressure drops - amiodarone added by cardiology ? ?Mild rhabdomyolysis - resolved  ?CK 1209 at  presentation after laying in floor all night  ? ?DM2 -uncontrolled with hyperglycemia ?A1c very high at >15.5 -insulin being titrated -CBG much better controlled with titration of medications over the last 48 hours ? ?Peripheral edema - resolved  ?no DVT on venous duplex - a consequence of ESRD as well as severe systolic CHF -resolved with adequate PD and now HD ? ?Possible sepsis - ruled out clinically  ?This diagnosis was based on hypothermia at  presentation as well as elevated lactate -these findings are explained by the fact that she slept on the floor all night prior to her presentation  ? ?Goals of care ?Unfortunately this patient finds herself in a very difficult situation due to multiple severe medical issues that do not have solutions -furthermore she lives alone and will not likely be able to care for herself going forward - Palliative Care assisting with complex goals of care - has now transition to HD which will make SNF placement possible ? ?Family Communication: No family present at time of exam ?Disposition: From home -pursue SNF placement now that HD has been initiated ? ?Objective: ?Blood pressure 122/81, pulse 90, temperature 98.6 ?F (37 ?C), resp. rate 14, height 5' (1.524 m), weight 67.1 kg, last menstrual period 03/13/2002, SpO2 94 %. ? ?Intake/Output Summary (Last 24 hours) at 04/22/2021 0853 ?Last data filed at 04/21/2021 1021 ?Gross per 24 hour  ?Intake 200 ml  ?Output 11029 ml  ?Net -10829 ml  ? ? ?Filed Weights  ? 04/19/21 1710 04/20/21 0608 04/21/21 0339  ?Weight: 65 kg 63 kg 67.1 kg  ? ? ?Examination: ?General: No acute respiratory distress -alert but confused ?Lungs: Clear to auscultation bilaterally without wheezing ?Cardiovascular: Regular rate and rhythm  ?Abdomen: Nontender, nondistended, soft, bowel sounds positive, no rebound ?Extremities: No significant edema bilateral lower extremities - no erythema or point tenderness of either foot -no ischemic change - no significant pedal edema - no focal joint inflammation of either foot -no calor ? ?CBC: ?Recent Labs  ?Lab 04/21/2021 ?1209 04/06/2021 ?1234 04/19/21 ?0981 04/21/21 ?1914 04/22/21 ?0118  ?WBC 10.0   < > 12.5* 11.9* 12.9*  ?NEUTROABS 8.2*  --   --   --   --   ?HGB 15.8*   < > 13.6 11.8* 12.0  ?HCT 46.5*   < > 40.3 36.2 37.0  ?MCV 85.2   < > 83.8 85.2 86.9  ?PLT 276   < > 304 413* 421*  ? < > = values in this interval not displayed.  ? ? ?Basic Metabolic Panel: ?Recent Labs   ?Lab 04/10/2021 ?1202 04/25/2021 ?1234 04/20/21 ?0103 04/21/21 ?7829 04/22/21 ?0118  ?NA 132*   < > 132* 132* 135  ?K 4.7   < > 3.9 3.8 3.6  ?CL 93*   < > 96* 96* 98  ?CO2 13*   < > 20* 21* 22  ?GLUCOSE 97   < > 391* 386* 154*  ?BUN 114*   < > 92* 80* 80*  ?CREATININE 16.03*   < > 13.52* 12.76* 13.05*  ?CALCIUM 6.5*   < > 6.9* 7.3* 8.1*  ?MG 1.9  --   --   --   --   ?PHOS 10.8*   < > 8.7* 7.5* 6.9*  ? < > = values in this interval not displayed.  ? ? ?GFR: ?Estimated Creatinine Clearance: 4 mL/min (A) (by C-G formula based on SCr of 13.05 mg/dL (H)). ? ?Liver Function Tests: ?Recent Labs  ?Lab 04/30/2021 ?1202 04/14/2021 ?0028 04/19/21 ?  0626 04/20/21 ?0103 04/21/21 ?9485 04/22/21 ?0118  ?AST 27 16 13*  --   --   --   ?ALT 15 17 15   --   --   --   ?ALKPHOS 171* 192* 170*  --   --   --   ?BILITOT 0.8 0.9 0.6  --   --   --   ?PROT 5.3* 4.0* 4.3*  --   --   --   ?ALBUMIN 2.0* <1.5* <1.5* <1.5* <1.5* <1.5*  ? ? ? ?Cardiac Enzymes: ?Recent Labs  ?Lab 04/13/2021 ?1202 04/17/21 ?0352 04/27/2021 ?0028  ?CKTOTAL 1,209* 526* 378*  ? ? ? ?HbA1C: ?Hemoglobin A1C  ?Date/Time Value Ref Range Status  ?02/29/2020 11:26 AM 13.9 (A) 4.0 - 5.6 % Final  ?11/24/2019 10:59 AM 10.3 (A) 4.0 - 5.6 % Final  ? ?HbA1c, POC (controlled diabetic range)  ?Date/Time Value Ref Range Status  ?02/26/2021 04:28 PM 12.2 (A) 0.0 - 7.0 % Final  ?11/18/2018 02:02 PM 7.3 (A) 0.0 - 7.0 % Final  ? ?Hgb A1c MFr Bld  ?Date/Time Value Ref Range Status  ?04/22/2021 12:09 PM >15.5 (H) 4.8 - 5.6 % Final  ?  Comment:  ?  (NOTE) ?**Verified by repeat analysis** ?        Prediabetes: 5.7 - 6.4 ?        Diabetes: >6.4 ?        Glycemic control for adults with diabetes: <7.0 ?  ?05/06/2019 12:22 PM 9.8 (H) 4.8 - 5.6 % Final  ?  Comment:  ?           Prediabetes: 5.7 - 6.4 ?         Diabetes: >6.4 ?         Glycemic control for adults with diabetes: <7.0 ?  ? ? ?CBG: ?Recent Labs  ?Lab 04/21/21 ?0617 04/21/21 ?1117 04/21/21 ?1609 04/21/21 ?2123 04/22/21 ?4627  ?GLUCAP 352* 192*  160* 121* 141*  ? ? ? ?Scheduled Meds: ? amiodarone  200 mg Oral BID  ? aspirin  81 mg Oral Daily  ? atorvastatin  80 mg Oral Daily  ? brimonidine  1 drop Left Eye BID  ? calcitRIOL  0.5 mcg Oral Daily  ? calcium

## 2021-04-22 NOTE — Progress Notes (Addendum)
? ?Progress Note ? ?Patient Name: Ann Bryan ?Date of Encounter: 04/22/2021 ? ?[facility] Cardiologist: Dorris Carnes, MD  ? ?Subjective  ? ?resting ? ?Inpatient Medications  ?  ?Scheduled Meds: ? amiodarone  200 mg Oral BID  ? aspirin  81 mg Oral Daily  ? atorvastatin  80 mg Oral Daily  ? brimonidine  1 drop Left Eye BID  ? calcitRIOL  0.5 mcg Oral Daily  ? calcium acetate  2,001 mg Oral TID WC  ? carvedilol  3.125 mg Oral BID WC  ? gabapentin  100 mg Oral Daily  ? heparin  5,000 Units Subcutaneous Q8H  ? hydrALAZINE  25 mg Oral Q8H  ? insulin aspart  0-5 Units Subcutaneous QHS  ? insulin aspart  0-9 Units Subcutaneous TID WC  ? insulin aspart  3 Units Subcutaneous TID WC  ? insulin glargine-yfgn  18 Units Subcutaneous Daily  ? isosorbide mononitrate  15 mg Oral Daily  ? loratadine  10 mg Oral Daily  ? ?Continuous Infusions: ? ceFAZolin    ? dialysis solution 1.5% low-MG/low-CA    ? ?PRN Meds: ?acetaminophen, calcium acetate, HYDROmorphone (DILAUDID) injection, nitroGLYCERIN, ondansetron (ZOFRAN) IV, phenol, povidone-iodine, prochlorperazine  ? ?Vital Signs  ?  ?Vitals:  ? 04/22/21 1130 04/22/21 1140 04/22/21 1145 04/22/21 1150  ?BP: 135/82 139/84 (!) 143/93 (!) 151/88  ?Pulse: 85 79 78 77  ?Resp: 10 11 16 15   ?Temp:      ?TempSrc:      ?SpO2: 99% 100% 100% 96%  ?Weight:      ?Height:      ? ? ?Intake/Output Summary (Last 24 hours) at 04/22/2021 1203 ?Last data filed at 04/22/2021 0748 ?Gross per 24 hour  ?Intake --  ?Output 11029 ml  ?Net -11029 ml  ? ? ?  04/21/2021  ?  3:39 AM 04/20/2021  ?  6:08 AM 04/19/2021  ?  5:10 PM  ?Last 3 Weights  ?Weight (lbs) 147 lb 14.9 oz 138 lb 14.2 oz 143 lb 4.8 oz  ?Weight (kg) 67.1 kg 63 kg 65 kg  ?   ? ?Telemetry  ?  ?SR - Personally Reviewed ? ?ECG  ?  ?No new - Personally Reviewed ? ?Physical Exam  ?EXAM per Dr. Sallyanne Kuster ?GEN: No acute distress.   ?Neck: No JVD.  Right internal jugular tunneled hemodialysis catheter in place. ?Cardiac: RRR, 1/6 early peaking ejection murmur at  the right upper sternal border,  no systolic murmurs, rubs, or gallops.  ?Respiratory: Clear to auscultation bilaterally. ?GI: Soft, nontender, non-distended  ?MS: No edema; No deformity.  Nonfunctional old fistula in the left antecubital area ?Neuro:  Nonfocal  ?Psych: Normal affect  ? ?Labs  ?  ?High Sensitivity Troponin:   ?Recent Labs  ?Lab 04/19/2021 ?1202 04/15/2021 ?2003 04/17/21 ?0352 04/17/21 ?0500 04/20/21 ?0921  ?TROPONINIHS 2,296* 1,704* 2,531* 2,204* 3,811*  ?   ?Chemistry ?Recent Labs  ?Lab 04/22/2021 ?1202 04/09/2021 ?1234 05/04/2021 ?0028 04/20/2021 ?1139 04/19/21 ?8185 04/20/21 ?0103 04/21/21 ?6314 04/22/21 ?0118  ?NA 132*   < > 129*   < > 128* 132* 132* 135  ?K 4.7   < > 4.1   < > 4.2 3.9 3.8 3.6  ?CL 93*   < > 95*  --  91* 96* 96* 98  ?CO2 13*   < > 15*  --  18* 20* 21* 22  ?GLUCOSE 97   < > 305*  --  347* 391* 386* 154*  ?BUN 114*   < > 115*  --  105* 92* 80* 80*  ?CREATININE 16.03*   < > 15.04*  --  14.55* 13.52* 12.76* 13.05*  ?CALCIUM 6.5*   < > 6.3*  --  6.5* 6.9* 7.3* 8.1*  ?MG 1.9  --   --   --   --   --   --   --   ?PROT 5.3*  --  4.0*  --  4.3*  --   --   --   ?ALBUMIN 2.0*  --  <1.5*  --  <1.5* <1.5* <1.5* <1.5*  ?AST 27  --  16  --  13*  --   --   --   ?ALT 15  --  17  --  15  --   --   --   ?ALKPHOS 171*  --  192*  --  170*  --   --   --   ?BILITOT 0.8  --  0.9  --  0.6  --   --   --   ?GFRNONAA 2*   < > 3*  --  3* 3* 3* 3*  ?ANIONGAP 26*   < > 19*  --  19* 16* 15 15  ? < > = values in this interval not displayed.  ?  ?Lipids No results for input(s): CHOL, TRIG, HDL, LABVLDL, LDLCALC, CHOLHDL in the last 168 hours.  ?Hematology ?Recent Labs  ?Lab 04/19/21 ?1607 04/21/21 ?3710 04/22/21 ?0118  ?WBC 12.5* 11.9* 12.9*  ?RBC 4.81 4.25 4.26  ?HGB 13.6 11.8* 12.0  ?HCT 40.3 36.2 37.0  ?MCV 83.8 85.2 86.9  ?MCH 28.3 27.8 28.2  ?MCHC 33.7 32.6 32.4  ?RDW 13.6 13.7 13.8  ?PLT 304 413* 421*  ? ?Thyroid  ?Recent Labs  ?Lab 04/29/2021 ?1202  ?TSH 0.724  ?  ?BNPNo results for input(s): BNP, PROBNP in the last 168  hours.  ?DDimer No results for input(s): DDIMER in the last 168 hours.  ? ?Radiology  ?  ?No results found. ? ?Cardiac Studies  ?Rt and Lt cardiac cath 18-May-2021 ?CONCLUSIONS: ?Probable mixed ischemic and nonischemic systolic heart failure, compensated with LVEDP normal but with elevated capillary wedge pressure. ?Hemodynamics/cardiac output suggest cardiogenic shock although measurements were made in the setting of intermittent supraventricular tachycardia at which time systolic blood pressures would be less than 80 mmHg. ?Three-vessel coronary artery disease with 75% mid LAD, 99% mid third obtuse marginal, total occlusion of the circumflex beyond the third obtuse marginal, total occlusion of the dominant right coronary proximally.  There are left-to-right collaterals. ?  ?RECOMMENDATIONS: ?  ?Severe calcification of coronary arteries and very low ejection fraction and blood pressure make PCI unappealing.  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 ?Recommend advanced heart failure team consultation. ?Prognosis and treatment strategy are limited given end-stage renal disease. ? ?Diagnostic ?Dominance: Right ? ?Echo 04/27/2021 ? 1. Left ventricular ejection fraction by 3D volume is 21 %. The left  ?ventricle has severely decreased function. The left ventricle demonstrates  ?global hypokinesis. Left ventricular diastolic parameters are consistent  ?with Grade I diastolic dysfunction  ?(impaired relaxation). Elevated left ventricular end-diastolic pressure.  ? 2. Right ventricular systolic function is moderately reduced. The right  ?ventricular size is mildly enlarged. There is normal pulmonary artery  ?systolic pressure.  ? 3. Left atrial size was mildly dilated.  ? 4. There is small to moderate circumferential pericardial effusion. There  ?is no evidence of cardiac tamponade.  ? 5. The mitral valve is normal in structure. Mild mitral valve  ?regurgitation.  No evidence of mitral stenosis.  ? 6. The  aortic valve is normal in structure. Aortic valve regurgitation is  ?not visualized. No aortic stenosis is present.  ? 7. The inferior vena cava is normal in size with greater than 50%  ?respiratory variability, suggesting right atrial pressure of 3 mmHg.  ? ?Comparison(s): No prior Echocardiogram.  ? ?Patient Profile  ?   ?61 y.o. female hx of HTN, diabetes, diabetic nephropathy that lead to ESRD on PD, HLD, anxiety/depression admitted  04/22/2021  after being in floor due to weakness overnight thought to be due to hypoglycemia and troponins 2,296 for NSTEMI.   ? ?Assessment & Plan  ?  ?NSTEMI/severe CAD with cardiac cath 04/09/2021 with severe multivessel dz not amenable to revascularization with severely calcified vessels and low BP and mechanical support would be difficult with femoral artery calcifications..  Therapy limited by BP   ? ? ?NewHFrEF/cardiogenic shock   Severe biventricular dysfunction    ?--AHF has seen  ?--Options limited with ESRD and hypotension. Not a candidate for advanced therapies ? ?SVT ?--brief episode on tele <1 min --amio added to supress ? ?Hx of HTN ? ?ESRD on peritoneal dialysis per renal ? ?Uncontrolled IDDM with A1c of 15.5 per primary team ? ?Pt is DNR ? ? ?   ? ?For questions or updates, please contact [place] ?Please consult [email or web address] for contact info under  ? ?  ?   ?Signed, ?Cecilie Kicks, NP  ?04/22/2021, 12:03 PM   ? ?I have seen and examined the patient along with Cecilie Kicks, NP.  I have reviewed the chart, notes and new data.  I agree with PA/NP's note. ? ?Key new complaints: She had a terrible time of hemodialysis due to severe bilateral foot pain, with a pattern suggesting neuropathy.  Opiates did not help.  Her dose of gabapentin has been increased.  She denies any problems or shortness of breath or angina during hemodialysis.  He is a little agitated and has difficulty focusing on any other topics. ?Key examination changes: See above.  No overt findings to suggest  hypervolemia. ?Key new findings / data: She has not had any problems with SVT today.  Very poor glycemic control with hemoglobin A1c greater than 15.5%.  Severely elevated BUN and creatinine.  Albumin

## 2021-04-22 NOTE — Progress Notes (Signed)
?  [facility] ?Progress Note  ? ?Subjective:   seen in room. BUN 80, creat 13.  Had PD last night. NPO today. No c/o's.  ? ?Objective ?Vitals:  ? 04/21/21 2316 04/22/21 0327 04/22/21 0640 04/22/21 0751  ?BP: 113/62 137/84 134/75 122/81  ?Pulse: 78 90    ?Resp: 16 12 14    ?Temp: 98.6 ?F (37 ?C) 98.4 ?F (36.9 ?C) 98 ?F (36.7 ?C) 98.6 ?F (37 ?C)  ?TempSrc: Oral Oral Temporal   ?SpO2: 92% 94% 94%   ?Weight:      ?Height:      ? ?Physical Exam ?General: alert female in NAD ?Heart: RRR, no murmurs, rubs or gallops ?Lungs: CTA bilaterally without wheezing, rhonchi or rales ?Abdomen: Soft, non-tender, non-distended, +BS ?Extremities: trace- 1+ edema left lower extremity ?Dialysis Access: PD cath in lower abd, clean and dry ? ? ? ?Dialysis Orders: ?Nephrologist: Dr. Joelyn Oms. RN: Melisa ?PD 7 days per week, EDW 62kg, 4 exchanges, Dwell time 1.5 hours, Fill volume 2.2L, no day exchanges ?2 exchanges with 1.5% dextrose and two exchanges with 2.5% dextrose ? ?Assessment/Plan: ? Generalized weakness/ fall/ AMS: due to severe new CHF/ CAD and probable uremia as contributors. Mental status better now. Still remains weak. ?Severe systolic heart failure: new diagnosis, EF 21%. On coreg low dose ?Severe 3V CAD: new diagnosis, not amenable to intervention ?ESRD - was on PD, now is switching over to HD. She agreed to transition to HD due to PD failure and progressive debility. Have consulted IR for new TDC.  Will ask [facility] to see for perm access. Plan HD today or tomorrow.  ? Hypertension/volume: hydralazine added for BP control, + low dose coreg and imdur per cardiology. Some mild edema on exam. Continue all 2.5 % fluids.  ? Anemia: Hgb elevated, no ESA indicated ? Metabolic bone disease: phos 10 > 7s.  Continues binder and vdra. CCa in range, phos is elevated 7-8.  ? Nutrition:  Albumin quite low with poor intake. Will cancel renal diet given her poor nutritional status.  ?NSTEMI: Troponin elevated, per pmd ?T2DM: per  pmd.  ?Dispo/ GOC - pt is DNR now. Likely will need SNF placement.  ? ?Kelly Splinter, MD ?04/22/2021, 10:58 AM ? ?Recent Labs  ?Lab 04/21/21 ?0332 04/22/21 ?0118  ?HGB 11.8* 12.0  ?ALBUMIN <1.5* <1.5*  ?CALCIUM 7.3* 8.1*  ?PHOS 7.5* 6.9*  ?CREATININE 12.76* 13.05*  ?K 3.8 3.6  ? ? ? ? ?Medications: ?  ceFAZolin (ANCEF) IV    ? dialysis solution 2.5% low-MG/low-CA    ? ? amiodarone  200 mg Oral BID  ? aspirin  81 mg Oral Daily  ? atorvastatin  80 mg Oral Daily  ? brimonidine  1 drop Left Eye BID  ? calcitRIOL  0.5 mcg Oral Daily  ? calcium acetate  2,001 mg Oral TID WC  ? carvedilol  3.125 mg Oral BID WC  ? gabapentin  100 mg Oral Daily  ? gentamicin cream  1 application. Topical Daily  ? heparin  5,000 Units Subcutaneous Q8H  ? hydrALAZINE  25 mg Oral Q8H  ? insulin aspart  0-5 Units Subcutaneous QHS  ? insulin aspart  0-9 Units Subcutaneous TID WC  ? insulin aspart  3 Units Subcutaneous TID WC  ? insulin glargine-yfgn  18 Units Subcutaneous Daily  ? isosorbide mononitrate  15 mg Oral Daily  ? loratadine  10 mg Oral Daily  ? ? ? ? ?

## 2021-04-22 NOTE — Consult Note (Addendum)
Hospital Consult    Reason for Consult:  Permanent HD access Requesting Physician:  Dr. Arlean Hopping MRN #:  [id number]  History of Present Illness: This is a 61 y.o. female with pertinent past medical history including CHF (EF 21%), CAD, HTN, Type II Diabetes mellitus, and ESRD on PD who presented via EMS after being found down after a fall in her home. She was initiated on her usual PD, but Nephrology is concerned this is failing as well as some concerns about progressive debility that will not allow her to continue PD at home so she is in process of converting to hemodialysis. She had Right IJ TDC placed by IR earlier today. History of prior failed left brachiocephalic AV fistula 01/21/19 by Dr. Myra Gianotti. She is right hand dominant. Does not report any other central lines, PICC lines, or pacemaker. No upper extremity limitations.   Past Medical History:  Diagnosis Date   Anemia    Cataract    right eye   Complication of anesthesia     " I woke up itching from anesthesia many years ago."    Depression    at times   Diabetes mellitus 1986    poorly controlled last hemoglobin A1c  10.5 on January 01, 2010, increase since June 2011 when hemoglobin A1c was 9.3 , determined to be most likely secondary to non-compliance.   Diabetic nephropathy (HCC)    Diabetic retinopathy    Dyspnea    "when I have too much fluid"   End stage renal disease on dialysis (HCC)    T/Th/Sat [address]   Glaucoma    History of blood transfusion    Hyperlipidemia    Hypertension    Nausea and vomiting    Neuropathy    Retinopathy    Wears glasses     Past Surgical History:  Procedure Laterality Date   ABDOMINAL HYSTERECTOMY   2004   AV FISTULA PLACEMENT Left 01/21/2019   Procedure: BRACHIOCEPHALIC ARTERIOVENOUS (AV) FISTULA CREATION LEFT ARM;  Surgeon: Nada Libman, MD;  Location: [facility] OR;  Service: Vascular;  Laterality: Left;   EYE SURGERY     FISTULA SUPERFICIALIZATION Left 05/20/2019   Procedure:  FISTULA SUPERFICIALIZATION LEFT PROXIMAL FISTULA;  Surgeon: Nada Libman, MD;  Location: [facility] OR;  Service: Vascular;  Laterality: Left;   IR FLUORO GUIDE CV LINE RIGHT  11/15/2018   IR FLUORO GUIDE CV LINE RIGHT  04/22/2021   IR US GUIDE VASC ACCESS RIGHT  11/15/2018   IR US GUIDE VASC ACCESS RIGHT  04/22/2021   MYOMECTOMY  1994   REFRACTIVE SURGERY Bilateral    RIGHT/LEFT HEART CATH AND CORONARY ANGIOGRAPHY N/A 04/18/2021   Procedure: RIGHT/LEFT HEART CATH AND CORONARY ANGIOGRAPHY;  Surgeon: Lyn Records, MD;  Location: [facility] INVASIVE CV LAB;  Service: Cardiovascular;  Laterality: N/A;    Allergies  Allergen Reactions   Doxycycline Nausea And Vomiting   Victoza [Liraglutide] Nausea And Vomiting   Chlorhexidine Rash    States rash with use of CHG bath    Prior to Admission medications   Medication Sig Start Date End Date Taking? Authorizing Provider  amLODipine (NORVASC) 10 MG tablet Take 1 tablet (10 mg total) by mouth daily. Must have office visit for refills 01/03/20  Yes Newlin, Enobong, MD  atorvastatin (LIPITOR) 20 MG tablet Take 1 tablet by mouth daily. Patient taking differently: Take 20 mg by mouth in the morning, at noon, and at bedtime. 11/30/19  Yes Marcine Matar, MD  brimonidine (  ALPHAGAN) 0.2 % ophthalmic solution Place 1 drop into the left eye 2 (two) times daily. 01/24/21  Yes [provider]  calcitRIOL (ROCALTROL) 0.5 MCG capsule Take 0.5 mcg by mouth daily. 03/11/21  Yes [provider]  calcium acetate (PHOSLO) 667 MG capsule Take 667-2,001 mg by mouth See admin instructions. 2,001 mg with meals 3 times daily and 667 mg 2 times daily with snacks   Yes [provider]  carvedilol (COREG) 25 MG tablet Take 1 and 1/2 tablets by mouth twice daily with a meal. Must have office visit for refills Patient taking differently: Take 37.5 mg by mouth 2 (two) times daily with a meal. Must have office visit for refills 12/26/19  Yes Marcine Matar, MD   cloNIDine (CATAPRES) 0.2 MG tablet Take 0.2 mg by mouth 3 (three) times daily. 06/27/20  Yes [provider]  gabapentin (NEURONTIN) 100 MG capsule 1 cap PO every other day Patient taking differently: 100 mg every other day. 02/26/21  Yes Marcine Matar, MD  hydrALAZINE (APRESOLINE) 100 MG tablet Take 100 mg by mouth in the morning, at noon, and at bedtime. 01/24/21  Yes [provider]  insulin aspart (NOVOLOG FLEXPEN) 100 UNIT/ML FlexPen 7 units subcut with meals Patient taking differently: 0-10 Units 3 (three) times daily with meals. Dose per sliding scale 02/26/21  Yes Marcine Matar, MD  insulin glargine (LANTUS SOLOSTAR) 100 UNIT/ML Solostar Pen Inject 30 Units into the skin daily. 02/26/21  Yes Marcine Matar, MD  losartan (COZAAR) 100 MG tablet Take 100 mg by mouth daily. 01/31/21  Yes [provider]  Blood Glucose Monitoring Suppl (TRUE METRIX METER) w/Device KIT 1 each by Does not apply route as needed. 11/18/18   Marcine Matar, MD  Continuous Blood Gluc Receiver (DEXCOM G6 RECEIVER) DEVI Use as instructed to check blood sugar daily 03/29/20   Shamleffer, Konrad Dolores, MD  Continuous Blood Gluc Sensor (DEXCOM G6 SENSOR) MISC 1 Device by Does not apply route as directed. 02/29/20   Shamleffer, Konrad Dolores, MD  Continuous Blood Gluc Transmit (DEXCOM G6 TRANSMITTER) MISC 1 Device by Does not apply route as directed. 02/29/20   Shamleffer, Konrad Dolores, MD  glucose blood (ONETOUCH VERIO) test strip Use as instructed to check blood sugar 3 times daily Dx is E11.65 03/23/20   Shamleffer, Konrad Dolores, MD  Insulin Pen Needle 32G X 4 MM MISC 1 Device by Does not apply route in the morning, at noon, in the evening, and at bedtime. 08/31/20   Shamleffer, Konrad Dolores, MD  Insulin Syringe-Needle U-100 (INSULIN SYRINGE 1CC/31GX5/16") 31G X 5/16" 1 ML MISC Use as directed.  BD 6mm x 31G needles 01/22/17   Marcine Matar, MD  Insulin Syringes,  Disposable, U-100 0.5 ML MISC 1 each by Does not apply route 2 (two) times daily. 01/30/14   Dessa Phi, MD  lidocaine-prilocaine (EMLA) cream See admin instructions. APPLY SMALL AMOUNT TO ACCESS SITE (AVF) 1 TO 2 HOURS BEFORE DIALYSIS. COVER WITH OCCLUSIVE DRESSING (SARAN WRAP) 03/19/21   [provider]  TRUEplus Lancets 28G MISC 1 each by Does not apply route 3 (three) times daily. 12/20/20   Shamleffer, Konrad Dolores, MD    Social History   Socioeconomic History   Marital status: Single    Spouse name: Not on file   Number of children: Not on file   Years of education: Not on file   Highest education level: Not on file  Occupational History  Not on file  Tobacco Use   Smoking status: Never   Smokeless tobacco: Never  Vaping Use   Vaping Use: Never used  Substance and Sexual Activity   Alcohol use: No   Drug use: No   Sexual activity: Not Currently  Other Topics Concern   Not on file  Social History Narrative   Not on file   Social Determinants of Health   Financial Resource Strain: Not on file  Food Insecurity: Not on file  Transportation Needs: Not on file  Physical Activity: Not on file  Stress: Not on file  Social Connections: Not on file  Intimate Partner Violence: Not on file     Family History  Problem Relation Age of Onset   Diabetes Mother    Stroke Mother    Hypertension Mother    Alcohol abuse Mother    Depression Father        Committed suicide   Alcohol abuse Maternal Uncle     ROS: Otherwise negative unless mentioned in HPI  Physical Examination  Vitals:   04/22/21 1330 04/22/21 1400  BP: 119/81 110/67  Pulse: 81 81  Resp:    Temp:    SpO2:     Body mass index is 27.81 kg/m.  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  CBC    Component Value Date/Time   WBC 12.9 (H) 04/22/2021 0118   RBC 4.26 04/22/2021 0118   HGB 12.0 04/22/2021 0118   HGB 12.1 01/16/2021 1018   HCT 37.0 04/22/2021 0118   HCT 38.1 01/16/2021 1018   PLT 421 (H) 04/22/2021 0118   PLT 303 01/16/2021 1018   MCV 86.9 04/22/2021 0118   MCV 86 01/16/2021 1018   MCH 28.2 04/22/2021 0118   MCHC 32.4 04/22/2021 0118   RDW 13.8 04/22/2021 0118   RDW 12.9 01/16/2021 1018   LYMPHSABS 1.2 04/16/2021 1209   LYMPHSABS 1.9 01/16/2021 1018   MONOABS 0.5 04/16/2021 1209   EOSABS 0.1 04/16/2021 1209   EOSABS 0.3 01/16/2021 1018   BASOSABS 0.0 04/16/2021 1209   BASOSABS 0.0 01/16/2021 1018    BMET    Component Value Date/Time   NA 135 04/22/2021 0118   NA 143 11/02/2017 1651   K 3.6 04/22/2021 0118   CL 98 04/22/2021 0118   CO2 22 04/22/2021 0118   GLUCOSE 154 (H) 04/22/2021 0118   BUN 80 (H) 04/22/2021 0118   BUN 64 (H) 11/02/2017 1651   CREATININE 13.05 (H) 04/22/2021 0118   CREATININE 1.69 (H) 12/10/2015 1145   CALCIUM 8.1 (L) 04/22/2021 0118   GFRNONAA 3 (L) 04/22/2021 0118   GFRNONAA 34 (L) 12/10/2015 1145   GFRAA 5 (L) 05/20/2019 1816   GFRAA 39 (L) 12/10/2015 1145    COAGS: Lab Results  Component Value Date   INR 1.1 11/15/2018     Non-Invasive Vascular Imaging:   Pending bilateral upper extremity vein mapping  Statin:  Yes.   Beta Blocker: Yes Aspirin:  Yes.   ACEI:  No. ARB:  No. CCB use:  No Other antiplatelets/anticoagulants:  No.  ASSESSMENT/PLAN: This is a 61 y.o. female with ESRD on PD. PD is felt to be failing and also not felt that patient is compliant and able to continue PD at home. She initially was  not agreeable to HD but now she is agreeable to being converted to HD. She is currently dialyzing via Right IJ TDC. I have seen her in the HD unit. I discussed with her need for evaluating her for new access either AVF vs AVG. She does not clearly express full understanding. She has not had a recent upper extremity vein mapping, I have placed an order for vein mapping. Will need to further discussed with her AVF vs AVG. The on call vascular surgeon, Dr. Randie Heinz will follow up after vein mapping to discuss further surgical planning.     Graceann Congress PA-C [facility] [phone] 04/22/2021  3:00 PM  I have independently interviewed and examined patient and agree with PA assessment and plan above.  Left arm AV fistula with very strong pulsatility and I cannot reasonably feel this up into the cephalic arch which is concerning for cephalic arch occlusion and possibly the fistula is open by collaterals.  We will start with fistulogram of the left upper extremity tomorrow in the Cath Lab.  She will be n.p.o. past midnight.  I discussed the options which will include endovascular treatment tomorrow versus possible need for new access in the left upper extremity or revision to include possible turndown procedure.  She seems to reasonably understand the need for the procedure and the details of the procedure I discussed with her today.  Wh[place]any Parish C. Randie Heinz, MD [facility] [place] Office: [phone] Pager: [phone]

## 2021-04-23 ENCOUNTER — Encounter (HOSPITAL_COMMUNITY): Payer: Self-pay | Admitting: Surgery

## 2021-04-23 ENCOUNTER — Encounter (HOSPITAL_COMMUNITY): Admission: EM | Disposition: E | Payer: Self-pay | Source: Other Acute Inpatient Hospital | Attending: Internal Medicine

## 2021-04-23 DIAGNOSIS — M79671 Pain in right foot: Secondary | ICD-10-CM

## 2021-04-23 DIAGNOSIS — R296 Repeated falls: Secondary | ICD-10-CM

## 2021-04-23 DIAGNOSIS — R627 Adult failure to thrive: Secondary | ICD-10-CM

## 2021-04-23 DIAGNOSIS — Z794 Long term (current) use of insulin: Secondary | ICD-10-CM

## 2021-04-23 DIAGNOSIS — I214 Non-ST elevation (NSTEMI) myocardial infarction: Secondary | ICD-10-CM | POA: Diagnosis not present

## 2021-04-23 DIAGNOSIS — I2584 Coronary atherosclerosis due to calcified coronary lesion: Secondary | ICD-10-CM

## 2021-04-23 DIAGNOSIS — Z992 Dependence on renal dialysis: Secondary | ICD-10-CM | POA: Diagnosis not present

## 2021-04-23 DIAGNOSIS — I471 Supraventricular tachycardia: Secondary | ICD-10-CM

## 2021-04-23 DIAGNOSIS — N186 End stage renal disease: Secondary | ICD-10-CM | POA: Diagnosis not present

## 2021-04-23 DIAGNOSIS — I5022 Chronic systolic (congestive) heart failure: Secondary | ICD-10-CM | POA: Diagnosis not present

## 2021-04-23 DIAGNOSIS — E113599 Type 2 diabetes mellitus with proliferative diabetic retinopathy without macular edema, unspecified eye: Secondary | ICD-10-CM | POA: Diagnosis not present

## 2021-04-23 DIAGNOSIS — N185 Chronic kidney disease, stage 5: Secondary | ICD-10-CM | POA: Diagnosis not present

## 2021-04-23 DIAGNOSIS — E1165 Type 2 diabetes mellitus with hyperglycemia: Secondary | ICD-10-CM

## 2021-04-23 DIAGNOSIS — M6281 Muscle weakness (generalized): Secondary | ICD-10-CM

## 2021-04-23 DIAGNOSIS — E785 Hyperlipidemia, unspecified: Secondary | ICD-10-CM | POA: Diagnosis not present

## 2021-04-23 DIAGNOSIS — M79672 Pain in left foot: Secondary | ICD-10-CM

## 2021-04-23 DIAGNOSIS — E1122 Type 2 diabetes mellitus with diabetic chronic kidney disease: Secondary | ICD-10-CM

## 2021-04-23 HISTORY — PX: A/V FISTULAGRAM: CATH118298

## 2021-04-23 LAB — GLUCOSE, CAPILLARY
Glucose-Capillary: 126 mg/dL — ABNORMAL HIGH (ref 70–99)
Glucose-Capillary: 100 mg/dL — ABNORMAL HIGH (ref 70–99)
Glucose-Capillary: 116 mg/dL — ABNORMAL HIGH (ref 70–99)
Glucose-Capillary: 132 mg/dL — ABNORMAL HIGH (ref 70–99)
Glucose-Capillary: 105 mg/dL — ABNORMAL HIGH (ref 70–99)
Glucose-Capillary: 65 mg/dL — ABNORMAL LOW (ref 70–99)

## 2021-04-23 LAB — RENAL FUNCTION PANEL
Albumin: <1.5 g/dL — ABNORMAL LOW (ref 3.5–5.0)
Anion gap: 13 (ref 5–15)
BUN: 44 mg/dL — ABNORMAL HIGH (ref 6–20)
CO2: 25 mmol/L (ref 22–32)
Calcium: 8 mg/dL — ABNORMAL LOW (ref 8.9–10.3)
Chloride: 98 mmol/L (ref 98–111)
Creatinine, Ser: 8.98 mg/dL — ABNORMAL HIGH (ref 0.44–1.00)
GFR, Estimated: 5 mL/min — ABNORMAL LOW (ref >60)
Glucose, Bld: 146 mg/dL — ABNORMAL HIGH (ref 70–99)
Phosphorus: 5.3 mg/dL — ABNORMAL HIGH (ref 2.5–4.6)
Potassium: 4.5 mmol/L (ref 3.5–5.1)
Sodium: 136 mmol/L (ref 135–145)

## 2021-04-23 LAB — CBC
HCT: 39 % (ref 36.0–46.0)
Hemoglobin: 12.4 g/dL (ref 12.0–15.0)
MCH: 28 pg (ref 26.0–34.0)
MCHC: 31.8 g/dL (ref 30.0–36.0)
MCV: 88 fL (ref 80.0–100.0)
Platelets: 413 10*3/uL — ABNORMAL HIGH (ref 150–400)
RBC: 4.43 MIL/uL (ref 3.87–5.11)
RDW: 14.1 % (ref 11.5–15.5)
WBC: 14.9 10*3/uL — ABNORMAL HIGH (ref 4.0–10.5)
nRBC: 0 % (ref 0.0–0.2)

## 2021-04-23 LAB — IRON AND TIBC
Iron: 30 ug/dL (ref 28–170)
Saturation Ratios: 22 % (ref 10.4–31.8)
TIBC: 139 ug/dL — ABNORMAL LOW (ref 250–450)
UIBC: 109 ug/dL

## 2021-04-23 LAB — HEPATITIS B SURFACE ANTIBODY, QUANTITATIVE: Hep B S AB Quant (Post): 26.3 m[IU]/mL (ref >9.9)

## 2021-04-23 LAB — MAGNESIUM: Magnesium: 1.7 mg/dL (ref 1.7–2.4)

## 2021-04-23 LAB — FOLATE: Folate: 8.5 ng/mL (ref >5.9)

## 2021-04-23 LAB — RETICULOCYTES
Immature Retic Fract: 26.7 % — ABNORMAL HIGH (ref 2.3–15.9)
RBC.: 4.48 MIL/uL (ref 3.87–5.11)
Retic Count, Absolute: 145.6 10*3/uL (ref 19.0–186.0)
Retic Ct Pct: 3.3 % — ABNORMAL HIGH (ref 0.4–3.1)

## 2021-04-23 LAB — VITAMIN B12: Vitamin B-12: 722 pg/mL (ref 180–914)

## 2021-04-23 LAB — FERRITIN: Ferritin: 2086 ng/mL — ABNORMAL HIGH (ref 11–307)

## 2021-04-23 SURGERY — A/V FISTULAGRAM
Anesthesia: LOCAL | Laterality: Left

## 2021-04-23 MED ORDER — HEPARIN (PORCINE) IN NACL 1000-0.9 UT/500ML-% IV SOLN
INTRAVENOUS | Status: DC | PRN
  Administered 2021-04-23: 500 mL

## 2021-04-23 MED ORDER — SEVELAMER CARBONATE 800 MG PO TABS: 800.0000 mg | ORAL_TABLET | Freq: Two times a day (BID) | ORAL | Status: DC | PRN

## 2021-04-23 MED ORDER — MIDAZOLAM HCL 2 MG/2ML IJ SOLN
INTRAMUSCULAR | Status: AC
  Filled 2021-04-23: qty 2

## 2021-04-23 MED ORDER — CARVEDILOL 6.25 MG PO TABS
6.2500 mg | ORAL_TABLET | Freq: Two times a day (BID) | ORAL | Status: DC
  Administered 2021-04-23 – 2021-04-24 (×2): 6.25 mg via ORAL
  Filled 2021-04-23 (×3): qty 1

## 2021-04-23 MED ORDER — SEVELAMER CARBONATE 800 MG PO TABS
2400.0000 mg | ORAL_TABLET | Freq: Three times a day (TID) | ORAL | Status: DC
  Administered 2021-04-23: 2400 mg via ORAL
  Filled 2021-04-23: qty 3

## 2021-04-23 MED ORDER — HEPARIN (PORCINE) IN NACL 1000-0.9 UT/500ML-% IV SOLN
INTRAVENOUS | Status: AC
Start: 2021-04-23 — End: ?
  Filled 2021-04-23: qty 500

## 2021-04-23 MED ORDER — LIDOCAINE HCL (PF) 1 % IJ SOLN
INTRAMUSCULAR | Status: DC | PRN
  Administered 2021-04-23: 2 mL

## 2021-04-23 MED ORDER — MIDAZOLAM HCL 2 MG/2ML IJ SOLN
INTRAMUSCULAR | Status: DC | PRN
  Administered 2021-04-23: 1 mg via INTRAVENOUS

## 2021-04-23 MED ORDER — CALCITRIOL 0.25 MCG PO CAPS
1.0000 ug | ORAL_CAPSULE | ORAL | Status: DC
  Administered 2021-04-25: 1 ug via ORAL
  Filled 2021-04-23 (×2): qty 4

## 2021-04-23 MED ORDER — HEPARIN SODIUM (PORCINE) 1000 UNIT/ML IJ SOLN
INTRAMUSCULAR | Status: AC
  Administered 2021-04-23: 1000 [IU]
  Filled 2021-04-23: qty 4

## 2021-04-23 MED ORDER — FENTANYL CITRATE (PF) 100 MCG/2ML IJ SOLN
INTRAMUSCULAR | Status: AC
  Filled 2021-04-23: qty 2

## 2021-04-23 MED ORDER — FENTANYL 12 MCG/HR TD PT72: 1.0000 | MEDICATED_PATCH | TRANSDERMAL | Status: DC

## 2021-04-23 MED ORDER — LIDOCAINE HCL (PF) 1 % IJ SOLN
INTRAMUSCULAR | Status: AC
  Filled 2021-04-23: qty 30

## 2021-04-23 MED ORDER — GABAPENTIN 100 MG PO CAPS
200.0000 mg | ORAL_CAPSULE | Freq: Two times a day (BID) | ORAL | Status: DC
Start: 2021-04-23 — End: 2021-04-24
  Administered 2021-04-23 – 2021-04-24 (×2): 200 mg via ORAL
  Filled 2021-04-23 (×2): qty 2

## 2021-04-23 MED ORDER — FENTANYL CITRATE (PF) 100 MCG/2ML IJ SOLN
INTRAMUSCULAR | Status: DC | PRN
  Administered 2021-04-23: 25 ug via INTRAVENOUS

## 2021-04-23 MED ORDER — NEPRO/CARBSTEADY PO LIQD
237.0000 mL | Freq: Two times a day (BID) | ORAL | Status: DC
Start: 2021-04-23 — End: 2021-04-25

## 2021-04-23 MED ORDER — PROSOURCE PLUS PO LIQD
30.0000 mL | Freq: Two times a day (BID) | ORAL | Status: DC
  Administered 2021-04-23 – 2021-04-25 (×3): 30 mL via ORAL
  Filled 2021-04-23 (×3): qty 30

## 2021-04-23 MED ORDER — IODIXANOL 320 MG/ML IV SOLN
INTRAVENOUS | Status: DC | PRN
  Administered 2021-04-23: 35 mL

## 2021-04-23 SURGICAL SUPPLY — 9 items
BAG SNAP BAND KOVER 36X36 (MISCELLANEOUS) ×4 IMPLANT
COVER DOME SNAP 22 D (MISCELLANEOUS) ×3 IMPLANT
KIT MICROPUNCTURE NIT STIFF (SHEATH) ×1 IMPLANT
PROTECTION STATION PRESSURIZED (MISCELLANEOUS) ×2
SHEATH PROBE COVER 6X72 (BAG) ×3 IMPLANT
STATION PROTECTION PRESSURIZED (MISCELLANEOUS) ×2 IMPLANT
STOPCOCK MORSE 400PSI 3WAY (MISCELLANEOUS) ×3 IMPLANT
TRAY PV CATH (CUSTOM PROCEDURE TRAY) ×3 IMPLANT
TUBING CIL FLEX 10 FLL-RA (TUBING) ×3 IMPLANT

## 2021-04-23 NOTE — Progress Notes (Signed)
?[facility] ?Progress Note  ? ?Subjective:  Seen on HD today - #2, 2L UFG. BP stable. She is drowsy, but answering basic questions. Got fentanyl/versed for arteriogram this AM, as well as several doses dilaudid so likely contributing.  ? ?Objective ?Vitals:  ? 04/10/2021 1000 04/19/2021 1030 04/15/2021 1100 04/19/2021 1115  ?BP: 105/63 (P) 102/64 (!) 89/66 129/79  ?Pulse: 79 (P) 84 84 83  ?Resp:      ?Temp:      ?TempSrc:      ?SpO2:      ?Weight:      ?Height:      ? 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 ?Additional Objective ?Labs: ?Basic Metabolic Panel: ?Recent Labs  ?Lab 04/21/21 ?0332 04/22/21 ?0118 04/15/2021 ?0142  ?NA 132* 135 136  ?K 3.8 3.6 4.5  ?CL 96* 98 98  ?CO2 21* 22 25  ?GLUCOSE 386* 154* 146*  ?BUN 80* 80* 44*  ?CREATININE 12.76* 13.05* 8.98*  ?CALCIUM 7.3* 8.1* 8.0*  ?PHOS 7.5* 6.9* 5.3*  ? ?Liver Function Tests: ?Recent Labs  ?Lab 04/09/2021 ?1202 04/29/2021 ?0028 04/19/21 ?0272 04/20/21 ?0103 04/21/21 ?5366 04/22/21 ?0118 04/22/2021 ?0142  ?AST 27 16 13*  --   --   --   --   ?ALT '15 17 15  ' --   --   --   --   ?ALKPHOS 171* 192* 170*  --   --   --   --   ?BILITOT 0.8 0.9 0.6  --   --   --   --   ?PROT 5.3* 4.0* 4.3*  --   --   --   --   ?ALBUMIN 2.0* <1.5* <1.5*   < > <1.5* <1.5* <1.5*  ? < > = values in this interval not displayed.  ? ?CBC: ?Recent Labs  ?Lab 04/25/2021 ?1209 04/10/2021 ?1234 04/29/2021 ?0028 04/11/2021 ?1139 04/19/21 ?4403 04/21/21 ?4742 04/22/21 ?0118 04/08/2021 ?0142  ?WBC 10.0   < > 11.4*  --  12.5* 11.9* 12.9* 14.9*  ?NEUTROABS 8.2*  --   --   --   --   --   --   --   ?HGB 15.8*   < > 12.9   < > 13.6 11.8* 12.0 12.4  ?HCT 46.5*   < > 39.2   < > 40.3 36.2 37.0 39.0  ?MCV 85.2   < > 84.5  --  83.8 85.2 86.9 88.0  ?PLT 276   < > 287  --  304 413* 421* 413*  ? < > = values in this interval not  displayed.  ? ?Blood Culture ?   ?Component Value Date/Time  ? SDES BLOOD SITE NOT SPECIFIED 04/20/2021 1202  ? SPECREQUEST  05/03/2021 1202  ?  BOTTLES DRAWN AEROBIC AND ANAEROBIC Blood Culture results may not be optimal due to an inadequate volume of blood received in culture bottles  ? CULT  04/14/2021 1202  ?  NO GROWTH 5 DAYS ?Performed at [facility], [place] [address] [place], [place] [address] ?  ? REPTSTATUS 04/21/2021 FINAL 04/08/2021 1202  ? ? ?Cardiac Enzymes: ?Recent Labs  ?Lab 04/14/2021 ?1202 04/17/21 ?0352 04/25/2021 ?0028  ?CKTOTAL 1,209* 526* 378*  ? ?CBG: ?Recent Labs  ?Lab 04/22/21 ?0611 04/22/21 ?1632 04/22/21 ?2132 04/11/2021 ?0630 04/15/2021 ?0849  ?GLUCAP 141* 88 265* 126* 100*  ? ?Iron Studies:  ?  Recent Labs  ?  04/18/2021 ?0142  ?IRON 30  ?TIBC 139*  ?FERRITIN 2,086*  ? ?Studies/Results: ?PERIPHERAL VASCULAR CATHETERIZATION ? ?Result Date: 04/17/2021 ?Images from the original result were not included. Patient name: Ann Bryan MRN: [id number] DOB: 04-15-1960 Sex: female 04/24/2021 Pre-operative Diagnosis: ESRD Post-operative diagnosis:  Same Surgeon:  [name] Procedure Performed:  1.  Ultrasound-guided access, left cephalic vein  2.  Shuntogram  3.  Ultrasound-guided access, left brachial vein  4.  Central venogram  5.  Conscious sedation, 16 minutes  Indications: This is a 61 year old female with end-stage renal disease.  She is transitioning from peritoneal dialysis to hemodialysis.  She has a left brachiocephalic fistula which is pulsatile.  She comes in today for further evaluation. Procedure:  The patient was identified in the holding area and taken to room 8.  The patient was then placed supine on the table and prepped and draped in the usual sterile fashion.  A time out was called.  Conscious sedation was administered with the use of IV fentanyl and Versed under continuous physician and nurse monitoring.  Heart rate, blood pressure, and oxygen saturations were continuously monitored.   Total sedation time was 16 minutes ultrasound was used to evaluate the fistula.  The vein was patent and compressible.  A digital ultrasound image was acquired.  The fistula was then accessed under ultrasound guidance using a micropuncture needle.  An 018 wire was then asvanced without resistance and a micropuncture sheath was placed.  Contrast injections were then performed through the sheath. Findings: The cephalic vein fistula appears to be patent without stenosis in the upper arm.  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  I did not see any reconstitution of the central veins once the fistula occluded.  Next, I elected to cannulate the brachial vein.  This was done under ultrasound guidance with a micropuncture needle.  A micropuncture sheath was placed over a [place] wire and a left arm venogram was performed which showed a patent central venous system with small appearing brachial veins.  Intervention:  none Impression:  #1  Left cephalic vein fistula is occluded within the chest.  I did not see any central vein reconstitution from the fistulogram and so I cannulated the brachial vein and performed a venogram which did show a patent subclavian and innominate vein draining back into the heart.  The brachial veins did appear small in the arm.  I think the next step is to proceed with a cephalic vein turndown. Theotis Burrow, M.D., [facility] [place] Office: [phone] Pager:  [phone]  ? ?IR Fluoro Guide CV Line Right ? ?Result Date: 04/22/2021 ?INDICATION: 61 year old female history of end-stage renal disease requiring central venous access for hemodialysis. EXAM: TUNNELED CENTRAL VENOUS HEMODIALYSIS CATHETER PLACEMENT WITH ULTRASOUND AND FLUOROSCOPIC GUIDANCE MEDICATIONS: Ancef 2 gm IV . The antibiotic was given in an appropriate time interval prior to skin puncture. ANESTHESIA/SEDATION: Moderate (conscious) sedation was employed during this procedure. A total of Versed 0 mg and  Fentanyl 0 mcg was administered intravenously. Moderate Sedation Time: 0 minutes. The patient's level of consciousness and vital signs were monitored continuously by radiology nursing throughout the procedure under my direct supervision. FLUOROSCOPY TIME:  0 minutes 6 seconds (1 mGy). COMPLICATIONS: None immediate. PROCEDURE: Informed written consent was obtained from the patient after a discussion of the risks, benefits, and alternatives to treatment. Questions regarding the procedure were encouraged and answered. The right neck and chest were prepped with chlorhexidine  in a sterile fashion, and a sterile drape was applied covering the operative field. Maximum barrier sterile technique with sterile gowns and gloves were used for the procedure. A timeout was performed prior to the initiation of the procedure. After creating a small venotomy incision, a 21 gauge micropuncture kit was utilized to access the internal jugular vein. Real-time ultrasound guidance was utilized for vascular access including the acquisition of a permanent ultrasound image documenting patency of the accessed vessel. A Rosen wire was advanced to the level of the IVC and the micropuncture sheath was exchanged for an 8 Fr dilator. A 14.5 French tunneled hemodialysis catheter measuring 19 cm from tip to cuff was tunneled in a retrograde fashion from the anterior chest wall to the venotomy incision. Serial dilation was then performed an a peel-away sheath was placed. The catheter was then placed through the peel-away sheath with the catheter tip ultimately positioned within the right atrium. Final catheter positioning was confirmed and documented with a spot radiographic image. The catheter aspirates and flushes normally. The catheter was flushed with appropriate volume heparin dwells. The catheter exit site was secured with a 0-Silk retention suture. The venotomy incision was closed with Dermabond. Sterile dressings were applied. The patient  tolerated the procedure well without immediate post procedural complication. IMPRESSION: Successful placement of 19 cm tip to cuff tunneled hemodialysis catheter via the right internal jugular vein with c

## 2021-04-23 NOTE — Progress Notes (Signed)
? ?Progress Note ? ?Patient Name: Ann Bryan ?Date of Encounter: 04/20/2021 ? ?[facility] Cardiologist: Dorris Carnes, MD  ? ?Subjective  ? ?Doing better today.  No angina.  Continues to complain of bilateral leg pain which is by far her biggest concern.  No discomfort at the dialysis catheter site and no bleeding. ? ?Inpatient Medications  ?  ?Scheduled Meds: ? amiodarone  200 mg Oral BID  ? aspirin  81 mg Oral Daily  ? atorvastatin  80 mg Oral Daily  ? brimonidine  1 drop Left Eye BID  ? calcitRIOL  0.5 mcg Oral Daily  ? calcium acetate  2,001 mg Oral TID WC  ? carvedilol  3.125 mg Oral BID WC  ? colchicine  0.3 mg Oral Daily  ? gabapentin  100 mg Oral BID  ? heparin  5,000 Units Subcutaneous Q8H  ? heparin sodium (porcine)      ? hydrALAZINE  25 mg Oral Q8H  ? insulin aspart  0-5 Units Subcutaneous QHS  ? insulin aspart  0-9 Units Subcutaneous TID WC  ? insulin aspart  3 Units Subcutaneous TID WC  ? insulin glargine-yfgn  18 Units Subcutaneous Daily  ? isosorbide mononitrate  15 mg Oral Daily  ? loratadine  10 mg Oral Daily  ? nystatin   Topical TID  ? ?Continuous Infusions: ? ?PRN Meds: ?acetaminophen, calcium acetate, HYDROmorphone (DILAUDID) injection, nitroGLYCERIN, ondansetron (ZOFRAN) IV, oxyCODONE, phenol, povidone-iodine, prochlorperazine, traMADol  ? ?Vital Signs  ?  ?Vitals:  ? 04/12/2021 0757 04/29/2021 0802 04/20/2021 4580 04/20/2021 9983  ?BP: (!) 150/78 (!) 157/80 (!) 152/79   ?Pulse: 84 81 80 79  ?Resp: 16 17 16 16   ?Temp:      ?TempSrc:      ?SpO2: 100% 100% 100%   ?Weight:      ?Height:      ? ? ?Intake/Output Summary (Last 24 hours) at 04/13/2021 0926 ?Last data filed at 04/22/2021 1534 ?Gross per 24 hour  ?Intake --  ?Output 1600 ml  ?Net -1600 ml  ? ? ?  04/22/2021  ?  3:34 PM 04/22/2021  ? 12:19 PM 04/21/2021  ?  3:39 AM  ?Last 3 Weights  ?Weight (lbs) 138 lb 14.2 oz 142 lb 6.7 oz 147 lb 14.9 oz  ?Weight (kg) 63 kg 64.6 kg 67.1 kg  ?   ? ?Telemetry  ?  ?Normal sinus rhythm- Personally Reviewed ? ?ECG   ?  ?No new tracing- Personally Reviewed ? ?Physical Exam  ?Appears comfortable lying fully flat in bed ?GEN: No acute distress.   ?Neck: No JVD ?Cardiac: RRR, 1-2/6 early peaking aortic ejection murmur, no diastolic murmurs, rubs, or gallops.  ?Respiratory: Clear to auscultation bilaterally. ?GI: Soft, nontender, non-distended  ?MS: No edema; No deformity.  Nonfunctioning old AV graft left antecubital area ?Neuro:  Nonfocal  ?Psych: Normal affect  ? ?Labs  ?  ?High Sensitivity Troponin:   ?Recent Labs  ?Lab 04/15/2021 ?1202 04/11/2021 ?2003 04/17/21 ?0352 04/17/21 ?0500 04/20/21 ?0921  ?TROPONINIHS 2,296* 1,704* 2,531* 2,204* 3,811*  ?   ?Chemistry ?Recent Labs  ?Lab 04/14/2021 ?1202 04/08/2021 ?1234 04/10/2021 ?0028 04/13/2021 ?1139 04/19/21 ?3825 04/20/21 ?0103 04/21/21 ?0539 04/22/21 ?0118 04/27/2021 ?0142  ?NA 132*   < > 129*   < > 128*   < > 132* 135 136  ?K 4.7   < > 4.1   < > 4.2   < > 3.8 3.6 4.5  ?CL 93*   < > 95*  --  91*   < > 96* 98 98  ?CO2 13*   < > 15*  --  18*   < > 21* 22 25  ?GLUCOSE 97   < > 305*  --  347*   < > 386* 154* 146*  ?BUN 114*   < > 115*  --  105*   < > 80* 80* 44*  ?CREATININE 16.03*   < > 15.04*  --  14.55*   < > 12.76* 13.05* 8.98*  ?CALCIUM 6.5*   < > 6.3*  --  6.5*   < > 7.3* 8.1* 8.0*  ?MG 1.9  --   --   --   --   --   --   --  1.7  ?PROT 5.3*  --  4.0*  --  4.3*  --   --   --   --   ?ALBUMIN 2.0*  --  <1.5*  --  <1.5*   < > <1.5* <1.5* <1.5*  ?AST 27  --  16  --  13*  --   --   --   --   ?ALT 15  --  17  --  15  --   --   --   --   ?ALKPHOS 171*  --  192*  --  170*  --   --   --   --   ?BILITOT 0.8  --  0.9  --  0.6  --   --   --   --   ?GFRNONAA 2*   < > 3*  --  3*   < > 3* 3* 5*  ?ANIONGAP 26*   < > 19*  --  19*   < > 15 15 13   ? < > = values in this interval not displayed.  ?  ?Lipids No results for input(s): CHOL, TRIG, HDL, LABVLDL, LDLCALC, CHOLHDL in the last 168 hours.  ?Hematology ?Recent Labs  ?Lab 04/21/21 ?0332 04/22/21 ?0118 04/24/2021 ?0142  ?WBC 11.9* 12.9* 14.9*  ?RBC 4.25 4.26  4.43  4.48  ?HGB 11.8* 12.0 12.4  ?HCT 36.2 37.0 39.0  ?MCV 85.2 86.9 88.0  ?MCH 27.8 28.2 28.0  ?MCHC 32.6 32.4 31.8  ?RDW 13.7 13.8 14.1  ?PLT 413* 421* 413*  ? ?Thyroid  ?Recent Labs  ?Lab 04/07/2021 ?1202  ?TSH 0.724  ?  ?BNPNo results for input(s): BNP, PROBNP in the last 168 hours.  ?DDimer No results for input(s): DDIMER in the last 168 hours.  ? ?Radiology  ?  ?PERIPHERAL VASCULAR CATHETERIZATION ? ?Result Date: 04/08/2021 ?Images from the original result were not included. Patient name: Ann Bryan MRN: [id number] DOB: March 03, 1960 Sex: female 05/04/2021 Pre-operative Diagnosis: ESRD Post-operative diagnosis:  Same Surgeon:  [name] Procedure Performed:  1.  Ultrasound-guided access, left cephalic vein  2.  Shuntogram  3.  Ultrasound-guided access, left brachial vein  4.  Central venogram  5.  Conscious sedation, 16 minutes  Indications: This is a 61 year old female with end-stage renal disease.  She is transitioning from peritoneal dialysis to hemodialysis.  She has a left brachiocephalic fistula which is pulsatile.  She comes in today for further evaluation. Procedure:  The patient was identified in the holding area and taken to room 8.  The patient was then placed supine on the table and prepped and draped in the usual sterile fashion.  A time out was called.  Conscious sedation was administered with the use of IV fentanyl and Versed under continuous physician  and nurse monitoring.  Heart rate, blood pressure, and oxygen saturations were continuously monitored.  Total sedation time was 16 minutes ultrasound was used to evaluate the fistula.  The vein was patent and compressible.  A digital ultrasound image was acquired.  The fistula was then accessed under ultrasound guidance using a micropuncture needle.  An 018 wire was then asvanced without resistance and a micropuncture sheath was placed.  Contrast injections were then performed through the sheath. Findings: The cephalic vein fistula appears to be  patent without stenosis in the upper arm.  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  I did not see any reconstitution of the central veins once the fistula occluded.  Next, I elected to cannulate the brachial vein.  This was done under ultrasound guidance with a micropuncture needle.  A micropuncture sheath was placed over a [place] wire and a left arm venogram was performed which showed a patent central venous system with small appearing brachial veins.  Intervention:  none Impression:  #1  Left cephalic vein fistula is occluded within the chest.  I did not see any central vein reconstitution from the fistulogram and so I cannulated the brachial vein and performed a venogram which did show a patent subclavian and innominate vein draining back into the heart.  The brachial veins did appear small in the arm.  I think the next step is to proceed with a cephalic vein turndown. Theotis Burrow, M.D., [facility] [place] Office: [phone] Pager:  [phone]  ? ?IR Fluoro Guide CV Line Right ? ?Result Date: 04/22/2021 ?INDICATION: 61 year old female history of end-stage renal disease requiring central venous access for hemodialysis. EXAM: TUNNELED CENTRAL VENOUS HEMODIALYSIS CATHETER PLACEMENT WITH ULTRASOUND AND FLUOROSCOPIC GUIDANCE MEDICATIONS: Ancef 2 gm IV . The antibiotic was given in an appropriate time interval prior to skin puncture. ANESTHESIA/SEDATION: Moderate (conscious) sedation was employed during this procedure. A total of Versed 0 mg and Fentanyl 0 mcg was administered intravenously. Moderate Sedation Time: 0 minutes. The patient's level of consciousness and vital signs were monitored continuously by radiology nursing throughout the procedure under my direct supervision. FLUOROSCOPY TIME:  0 minutes 6 seconds (1 mGy). COMPLICATIONS: None immediate. PROCEDURE: Informed written consent was obtained from the patient after a discussion of the risks, benefits, and alternatives to  treatment. Questions regarding the procedure were encouraged and answered. The right neck and chest were prepped with chlorhexidine in a sterile fashion, and a sterile drape was applied covering the operati

## 2021-04-23 NOTE — Progress Notes (Signed)
? Ann Bryan  EHO:[id number] DOB: [birth year] DOA: 04/22/2021 ?PCP: Ladell Pier, MD   ? ?Brief Narrative:  ?57yowith a history of DM2, HTN, HLD, diabetic neuropathy, and ESRD on PD who presented to the ED with severe generalized weakness for approximately 6-7 days and an eventual pre-syncopal spell resulting in a fall without loss of consciousness.  After her fall she was too weak to get up and spent the night on the floor, thusly missing her PD.  A neighbor called EMS who arrived to find the patient on the floor with stable vital signs and a normal CBG.  In the ED the patient was hypothermic with temperature 95.4.  WBC was borderline at 10. ? ?Consultants:  ?Cardiology - CHF Team  ?Nephrology ?Vascular Surgery ?Palliative Care  ? ?Code Status: NO CODE BLUE ? ?DVT prophylaxis: ?Subcutaneous heparin ? ?Interim Hx: ?The patient tells me she "feels terrible everywhere."  When I asked her to provide more detailed I am able to elucidate that she is having primarily ongoing bilateral foot pain.  She tells me the gabapentin seems to have a significant impact, as does the Dilaudid, but reports that the Dilaudid wears off too soon.  She denies chest pain or shortness of breath.  ? ?Assessment & Plan: ? ?ESRD on PD > HD ?It appears patient was not consistently compliant with PD at home -nonetheless, in that she is no longer safe to live independently, she has been transitioned to HD -she was initially resistant to this but has now agreed -HD cath placed in IR in right chest 4/17 with first HD 4/17 tolerated well ? ?HD access ?Right chest dialysis catheter placed in IR 4/17 -Vascular Surgery reevaluating left brachiocephalic AV fistula which was originally created 01/21/2019 ? ?Newly diagnosed severe systolic CHF ?TTE noted EF 21% w/ severely decreased LV systolic fxn - cardiac cath 4/13 suggests combination of ischemic and nonischemic etiologies - volume control per HD - advanced heart failure team reports no real  options for intervention beyond titration of medical care ? ?3 vessel calcific CAD ?Cardiology has evaluated- initial troponin at presentation quite elevated at 2296 - cardiac cath has revealed signif 3 vessel disease that is not amenable to intervention -continue low-dose BB ? ?Near syncope / fall / generalized weakness - severe failure to thrive ?due to severe systolic CHF and persistent uremia - will need SNF at time of d/c  ? ?B foot pain  ?Likely neuropathic in origin -physical exam without any evidence of ischemia/diminished blood flow -began on the right but now patient reports is present bilaterally since 4/16 -uric acid mildly elevated but exam nonfocal and not suggestive of an acute gout flare - titrating pain meds as able - I am doubtful we will be able to fully resolve her pain w/o causing severe sedation  ? ?Bursts of SVT ?Documented while patient in cardiac Cath Lab -appear to last less than 1 minute but are associated with blood pressure drops - amiodarone added by cardiology who hope to d/c this med in short course  ? ?Mild rhabdomyolysis - resolved  ?CK 1209 at presentation after laying in floor all night  ? ?DM2 -uncontrolled with hyperglycemia ?A1c very high at >15.5 -insulin being titrated -CBG much better controlled with titration of medications  ? ?Peripheral edema - resolved  ?no DVT on venous duplex - a consequence of ESRD as well as severe systolic CHF -resolved with adequate PD and now HD ? ?Possible sepsis - ruled out clinically  ?  This diagnosis was based on hypothermia at presentation as well as elevated lactate -these findings are explained by the fact that she slept on the floor all night prior to her presentation  ? ?Goals of care ?Unfortunately this patient finds herself in a very difficult situation due to multiple severe medical issues that do not have solutions -furthermore she lives alone and will not likely be able to care for herself going forward - Palliative Care assisting  with complex goals of care - has now transition to HD which will make SNF placement possible ? ?Family Communication: No family present at time of exam ?Disposition: From home -pursue SNF placement now that HD has been initiated ? ?Objective: ?Blood pressure (!) 152/79, pulse 79, temperature 98.2 ?F (36.8 ?C), temperature source Oral, resp. rate 16, height 5' (1.524 m), weight 63 kg, last menstrual period 03/13/2002, SpO2 100 %. ? ?Intake/Output Summary (Last 24 hours) at 04/09/2021 0840 ?Last data filed at 04/22/2021 1534 ?Gross per 24 hour  ?Intake --  ?Output 1600 ml  ?Net -1600 ml  ? ? ?Filed Weights  ? 04/21/21 0339 04/22/21 1219 04/22/21 1534  ?Weight: 67.1 kg 64.6 kg 63 kg  ? ? ?Examination: ?General: No acute respiratory distress -alert and appears calm, but c/o severe pain ?Lungs: Clear to auscultation bilaterally -no wheezing ?Cardiovascular: Regular rate and rhythm without rub ?Abdomen: Nontender, nondistended, soft, bowel sounds positive, no rebound ?Extremities: No significant edema bilateral lower extremities - no erythema or point tenderness of either foot - no ischemic change to exam- no significant pedal edema - no focal joint inflammation of either foot - no calor ? ?CBC: ?Recent Labs  ?Lab 04/21/2021 ?1209 04/19/2021 ?1234 04/21/21 ?3267 04/22/21 ?0118 04/17/2021 ?0142  ?WBC 10.0   < > 11.9* 12.9* 14.9*  ?NEUTROABS 8.2*  --   --   --   --   ?HGB 15.8*   < > 11.8* 12.0 12.4  ?HCT 46.5*   < > 36.2 37.0 39.0  ?MCV 85.2   < > 85.2 86.9 88.0  ?PLT 276   < > 413* 421* 413*  ? < > = values in this interval not displayed.  ? ? ?Basic Metabolic Panel: ?Recent Labs  ?Lab 04/29/2021 ?1202 04/11/2021 ?1234 04/21/21 ?1245 04/22/21 ?0118 05/03/2021 ?0142  ?NA 132*   < > 132* 135 136  ?K 4.7   < > 3.8 3.6 4.5  ?CL 93*   < > 96* 98 98  ?CO2 13*   < > 21* 22 25  ?GLUCOSE 97   < > 386* 154* 146*  ?BUN 114*   < > 80* 80* 44*  ?CREATININE 16.03*   < > 12.76* 13.05* 8.98*  ?CALCIUM 6.5*   < > 7.3* 8.1* 8.0*  ?MG 1.9  --   --   --   1.7  ?PHOS 10.8*   < > 7.5* 6.9* 5.3*  ? < > = values in this interval not displayed.  ? ? ?GFR: ?Estimated Creatinine Clearance: 5.6 mL/min (A) (by C-G formula based on SCr of 8.98 mg/dL (H)). ? ?Liver Function Tests: ?Recent Labs  ?Lab 04/15/2021 ?1202 04/20/2021 ?0028 04/19/21 ?8099 04/20/21 ?0103 04/21/21 ?8338 04/22/21 ?0118 04/22/2021 ?0142  ?AST 27 16 13*  --   --   --   --   ?ALT 15 17 15   --   --   --   --   ?ALKPHOS 171* 192* 170*  --   --   --   --   ?  BILITOT 0.8 0.9 0.6  --   --   --   --   ?PROT 5.3* 4.0* 4.3*  --   --   --   --   ?ALBUMIN 2.0* <1.5* <1.5* <1.5* <1.5* <1.5* <1.5*  ? ? ? ?Cardiac Enzymes: ?Recent Labs  ?Lab 04/14/2021 ?1202 04/17/21 ?0352 04/22/2021 ?0028  ?CKTOTAL 1,209* 526* 378*  ? ? ? ?HbA1C: ?Hemoglobin A1C  ?Date/Time Value Ref Range Status  ?02/29/2020 11:26 AM 13.9 (A) 4.0 - 5.6 % Final  ?11/24/2019 10:59 AM 10.3 (A) 4.0 - 5.6 % Final  ? ?HbA1c, POC (controlled diabetic range)  ?Date/Time Value Ref Range Status  ?02/26/2021 04:28 PM 12.2 (A) 0.0 - 7.0 % Final  ?11/18/2018 02:02 PM 7.3 (A) 0.0 - 7.0 % Final  ? ?Hgb A1c MFr Bld  ?Date/Time Value Ref Range Status  ?04/11/2021 12:09 PM >15.5 (H) 4.8 - 5.6 % Final  ?  Comment:  ?  (NOTE) ?**Verified by repeat analysis** ?        Prediabetes: 5.7 - 6.4 ?        Diabetes: >6.4 ?        Glycemic control for adults with diabetes: <7.0 ?  ?05/06/2019 12:22 PM 9.8 (H) 4.8 - 5.6 % Final  ?  Comment:  ?           Prediabetes: 5.7 - 6.4 ?         Diabetes: >6.4 ?         Glycemic control for adults with diabetes: <7.0 ?  ? ? ?CBG: ?Recent Labs  ?Lab 04/21/21 ?2123 04/22/21 ?1829 04/22/21 ?1632 04/22/21 ?2132 04/25/2021 ?0630  ?GLUCAP 121* 141* 88 265* 126*  ? ? ? ?Scheduled Meds: ? amiodarone  200 mg Oral BID  ? aspirin  81 mg Oral Daily  ? atorvastatin  80 mg Oral Daily  ? brimonidine  1 drop Left Eye BID  ? calcitRIOL  0.5 mcg Oral Daily  ? calcium acetate  2,001 mg Oral TID WC  ? carvedilol  3.125 mg Oral BID WC  ? colchicine  0.3 mg Oral Daily  ?  gabapentin  100 mg Oral BID  ? heparin  5,000 Units Subcutaneous Q8H  ? hydrALAZINE  25 mg Oral Q8H  ? insulin aspart  0-5 Units Subcutaneous QHS  ? insulin aspart  0-9 Units Subcutaneous TID WC  ? insulin aspar

## 2021-04-23 NOTE — TOC Progression Note (Signed)
Transition of Care (TOC) - Progression Note  ? ? ?Patient Details  ?Name: Ann Bryan ?MRN: [id number] ?Date of Birth: 12/25/1960 ? ?Transition of Care (TOC) CM/SW Contact  ?Kaidan Harpster Renold Don, LCSWA ?Phone Number: ?04/15/2021, 4:17 PM ? ?Clinical Narrative:    ?CSW spoke with pt about a SNF choice pt is not sure. CSW will continue to follow for discharge planning needs.  ? ? ?Expected Discharge Plan: [place] ?Barriers to Discharge: Continued Medical Work up ? ?Expected Discharge Plan and Services ?Expected Discharge Plan: [place] ?In-house Referral: Clinical Social Work ?Discharge Planning Services: CM Consult ?Post Acute Care Choice: [place] ?Living arrangements for the past 2 months: Apartment ?                ?  ?  ?  ?  ?  ?  ?  ?  ?  ?  ? ? ?Social Determinants of Health (SDOH) Interventions ?  ? ?Readmission Risk Interventions ?   ? View : No data to display.  ?  ?  ?  ? ? ?

## 2021-04-23 NOTE — Progress Notes (Signed)
OT Cancellation Note ? ?Patient Details ?Name: Ann Bryan ?MRN: [id number] ?DOB: Dec 12, 1960 ? ? ?Cancelled Treatment:    Reason Eval/Treat Not Completed: Patient at procedure or test/ unavailable at cath lab ? ?Layla Maw ?04/22/2021, 7:57 AM ?

## 2021-04-23 NOTE — Progress Notes (Signed)
Advised that pt will require in-center HD at d/c. Upon chart review, it appears plan is for snf placement at d/c. Contacted CSW to inquire if snf has been determined so pt can be clipped to clinic that snf will provide transportation to. Will follow and assist with out-pt HD arrangements.  ? ?Melven Sartorius ?Renal Navigator ?[phone] ?

## 2021-04-23 NOTE — Op Note (Signed)
? ? ?  Patient name: Ann Bryan MRN: [id number] DOB: [birth year] Sex: female ? ?05/03/2021 ?Pre-operative Diagnosis: ESRD ?Post-operative diagnosis:  Same ?Surgeon:  [name] ?Procedure Performed: ? 1.  Ultrasound-guided access, left cephalic vein ? 2.  Shuntogram ? 3.  Ultrasound-guided access, left brachial vein ? 4.  Central venogram ? 5.  Conscious sedation, 16 minutes ?  ? ?Indications: This is a 61 year old female with end-stage renal disease.  She is transitioning from peritoneal dialysis to hemodialysis.  She has a left brachiocephalic fistula which is pulsatile.  She comes in today for further evaluation. ? ?Procedure:  The patient was identified in the holding area and taken to room 8.  The patient was then placed supine on the table and prepped and draped in the usual sterile fashion.  A time out was called.  Conscious sedation was administered with the use of IV fentanyl and Versed under continuous physician and nurse monitoring.  Heart rate, blood pressure, and oxygen saturations were continuously monitored.  Total sedation time was 16 minutes ultrasound was used to evaluate the fistula.  The vein was patent and compressible.  A digital ultrasound image was acquired.  The fistula was then accessed under ultrasound guidance using a micropuncture needle.  An 018 wire was then asvanced without resistance and a micropuncture sheath was placed.  Contrast injections were then performed through the sheath. ? ?Findings: The cephalic vein fistula appears to be patent without stenosis in the upper arm.  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  I did not see any reconstitution of the central veins once the fistula occluded.  Next, I elected to cannulate the brachial vein.  This was done under ultrasound guidance with a micropuncture needle.  A micropuncture sheath was placed over a [place] wire and a left arm venogram was performed which showed a patent central venous system with small appearing brachial  veins. ?  ?Intervention:  none ? ?Impression: ? #1  Left cephalic vein fistula is occluded within the chest.  I did not see any central vein reconstitution from the fistulogram and so I cannulated the brachial vein and performed a venogram which did show a patent subclavian and innominate vein draining back into the heart.  The brachial veins did appear small in the arm.  I think the next step is to proceed with a cephalic vein turndown. ? ? ?V. [name], M.D., FACS ?[facility] ?Office: [phone] ?Pager:  [phone]  ?

## 2021-04-23 NOTE — Progress Notes (Signed)
This chaplain is present with the Pt. to follow up with the notarizing of the Pt. HCPOA. The documentation of the Pt. HCPOA choice  is not in the Pt. chart. ? ?The chaplain understands the Pt. is declining a chaplain visit today. The Pt. shares she is very uncomfortable while she is attempting to eat her lunch. The Pt. is updating the MD on her pain as the chaplain plans a revisit on Wednesday.  ? ?Chaplain Sallyanne Kuster ?[phone] ?

## 2021-04-23 NOTE — Progress Notes (Signed)
PT Cancellation Note ? ?Patient Details ?Name: SIMONE RODENBECK ?MRN: [id number] ?DOB: 1960/04/12 ? ? ?Cancelled Treatment:    Reason Eval/Treat Not Completed: Patient at procedure or test/unavailable ? ?Wyona Almas, PT, DPT ?[facility] Services ?Pager [phone] ?Office [phone] ? ? ? ?Carloine Margo Aye ?04/29/2021, 7:33 AM ? ? ?

## 2021-04-24 ENCOUNTER — Other Ambulatory Visit: Payer: Self-pay

## 2021-04-24 ENCOUNTER — Encounter (HOSPITAL_COMMUNITY): Payer: Self-pay | Admitting: Internal Medicine

## 2021-04-24 ENCOUNTER — Inpatient Hospital Stay (HOSPITAL_COMMUNITY): Payer: Medicare Other | Admitting: Anesthesiology

## 2021-04-24 ENCOUNTER — Encounter (HOSPITAL_COMMUNITY): Admission: EM | Disposition: E | Payer: Self-pay | Source: Other Acute Inpatient Hospital | Attending: Internal Medicine

## 2021-04-24 ENCOUNTER — Inpatient Hospital Stay (HOSPITAL_COMMUNITY): Payer: Medicare Other

## 2021-04-24 ENCOUNTER — Encounter (HOSPITAL_COMMUNITY): Payer: Medicare Other

## 2021-04-24 DIAGNOSIS — N189 Chronic kidney disease, unspecified: Secondary | ICD-10-CM | POA: Diagnosis not present

## 2021-04-24 DIAGNOSIS — Z992 Dependence on renal dialysis: Secondary | ICD-10-CM

## 2021-04-24 DIAGNOSIS — M6282 Rhabdomyolysis: Secondary | ICD-10-CM

## 2021-04-24 DIAGNOSIS — Z7189 Other specified counseling: Secondary | ICD-10-CM

## 2021-04-24 DIAGNOSIS — R001 Bradycardia, unspecified: Secondary | ICD-10-CM

## 2021-04-24 DIAGNOSIS — I471 Supraventricular tachycardia: Secondary | ICD-10-CM

## 2021-04-24 DIAGNOSIS — I2510 Atherosclerotic heart disease of native coronary artery without angina pectoris: Secondary | ICD-10-CM

## 2021-04-24 DIAGNOSIS — J9601 Acute respiratory failure with hypoxia: Secondary | ICD-10-CM | POA: Diagnosis not present

## 2021-04-24 DIAGNOSIS — T82898A Other specified complication of vascular prosthetic devices, implants and grafts, initial encounter: Secondary | ICD-10-CM

## 2021-04-24 DIAGNOSIS — I5021 Acute systolic (congestive) heart failure: Secondary | ICD-10-CM

## 2021-04-24 DIAGNOSIS — Z4889 Encounter for other specified surgical aftercare: Secondary | ICD-10-CM

## 2021-04-24 DIAGNOSIS — E785 Hyperlipidemia, unspecified: Secondary | ICD-10-CM

## 2021-04-24 DIAGNOSIS — J9600 Acute respiratory failure, unspecified whether with hypoxia or hypercapnia: Secondary | ICD-10-CM

## 2021-04-24 DIAGNOSIS — D631 Anemia in chronic kidney disease: Secondary | ICD-10-CM

## 2021-04-24 DIAGNOSIS — T68XXXA Hypothermia, initial encounter: Secondary | ICD-10-CM

## 2021-04-24 DIAGNOSIS — I10 Essential (primary) hypertension: Secondary | ICD-10-CM

## 2021-04-24 DIAGNOSIS — I5022 Chronic systolic (congestive) heart failure: Secondary | ICD-10-CM

## 2021-04-24 DIAGNOSIS — R55 Syncope and collapse: Secondary | ICD-10-CM

## 2021-04-24 DIAGNOSIS — N186 End stage renal disease: Secondary | ICD-10-CM | POA: Diagnosis not present

## 2021-04-24 DIAGNOSIS — N185 Chronic kidney disease, stage 5: Secondary | ICD-10-CM | POA: Diagnosis not present

## 2021-04-24 DIAGNOSIS — R627 Adult failure to thrive: Secondary | ICD-10-CM

## 2021-04-24 DIAGNOSIS — I25119 Atherosclerotic heart disease of native coronary artery with unspecified angina pectoris: Secondary | ICD-10-CM

## 2021-04-24 DIAGNOSIS — R579 Shock, unspecified: Secondary | ICD-10-CM

## 2021-04-24 DIAGNOSIS — G5790 Unspecified mononeuropathy of unspecified lower limb: Secondary | ICD-10-CM

## 2021-04-24 DIAGNOSIS — I132 Hypertensive heart and chronic kidney disease with heart failure and with stage 5 chronic kidney disease, or end stage renal disease: Secondary | ICD-10-CM

## 2021-04-24 HISTORY — PX: BASCILIC VEIN TRANSPOSITION: SHX5742

## 2021-04-24 LAB — BASIC METABOLIC PANEL
Anion gap: 18 — ABNORMAL HIGH (ref 5–15)
BUN: 36 mg/dL — ABNORMAL HIGH (ref 6–20)
CO2: 15 mmol/L — ABNORMAL LOW (ref 22–32)
Calcium: 8.3 mg/dL — ABNORMAL LOW (ref 8.9–10.3)
Chloride: 101 mmol/L (ref 98–111)
Creatinine, Ser: 7.9 mg/dL — ABNORMAL HIGH (ref 0.44–1.00)
GFR, Estimated: 5 mL/min — ABNORMAL LOW (ref >60)
Glucose, Bld: 163 mg/dL — ABNORMAL HIGH (ref 70–99)
Potassium: 5 mmol/L (ref 3.5–5.1)
Sodium: 134 mmol/L — ABNORMAL LOW (ref 135–145)

## 2021-04-24 LAB — GLUCOSE, CAPILLARY
Glucose-Capillary: 171 mg/dL — ABNORMAL HIGH (ref 70–99)
Glucose-Capillary: 151 mg/dL — ABNORMAL HIGH (ref 70–99)
Glucose-Capillary: 140 mg/dL — ABNORMAL HIGH (ref 70–99)
Glucose-Capillary: 171 mg/dL — ABNORMAL HIGH (ref 70–99)
Glucose-Capillary: 178 mg/dL — ABNORMAL HIGH (ref 70–99)

## 2021-04-24 LAB — RENAL FUNCTION PANEL
Albumin: <1.5 g/dL — ABNORMAL LOW (ref 3.5–5.0)
Anion gap: 11 (ref 5–15)
BUN: 31 mg/dL — ABNORMAL HIGH (ref 6–20)
CO2: 24 mmol/L (ref 22–32)
Calcium: 8.1 mg/dL — ABNORMAL LOW (ref 8.9–10.3)
Chloride: 97 mmol/L — ABNORMAL LOW (ref 98–111)
Creatinine, Ser: 7.06 mg/dL — ABNORMAL HIGH (ref 0.44–1.00)
GFR, Estimated: 6 mL/min — ABNORMAL LOW (ref >60)
Glucose, Bld: 175 mg/dL — ABNORMAL HIGH (ref 70–99)
Phosphorus: 5.8 mg/dL — ABNORMAL HIGH (ref 2.5–4.6)
Potassium: 4.7 mmol/L (ref 3.5–5.1)
Sodium: 132 mmol/L — ABNORMAL LOW (ref 135–145)

## 2021-04-24 LAB — POCT I-STAT 7, (LYTES, BLD GAS, ICA,H+H)
Acid-Base Excess: 0 mmol/L (ref 0.0–2.0)
Bicarbonate: 25.8 mmol/L (ref 20.0–28.0)
Calcium, Ion: 1.12 mmol/L — ABNORMAL LOW (ref 1.15–1.40)
HCT: 36 % (ref 36.0–46.0)
Hemoglobin: 12.2 g/dL (ref 12.0–15.0)
O2 Saturation: 100 %
Patient temperature: 98.5
Potassium: 4.9 mmol/L (ref 3.5–5.1)
Sodium: 130 mmol/L — ABNORMAL LOW (ref 135–145)
TCO2: 27 mmol/L (ref 22–32)
pCO2 arterial: 43.4 mmHg (ref 32–48)
pH, Arterial: 7.382 (ref 7.35–7.45)
pO2, Arterial: 434 mmHg — ABNORMAL HIGH (ref 83–108)

## 2021-04-24 LAB — CBC
HCT: 36.5 % (ref 36.0–46.0)
Hemoglobin: 11.4 g/dL — ABNORMAL LOW (ref 12.0–15.0)
MCH: 27.5 pg (ref 26.0–34.0)
MCHC: 31.2 g/dL (ref 30.0–36.0)
MCV: 88.2 fL (ref 80.0–100.0)
Platelets: 401 10*3/uL — ABNORMAL HIGH (ref 150–400)
RBC: 4.14 MIL/uL (ref 3.87–5.11)
RDW: 13.8 % (ref 11.5–15.5)
WBC: 17 10*3/uL — ABNORMAL HIGH (ref 4.0–10.5)
nRBC: 0 % (ref 0.0–0.2)

## 2021-04-24 LAB — MAGNESIUM: Magnesium: 2 mg/dL (ref 1.7–2.4)

## 2021-04-24 LAB — LACTIC ACID, PLASMA
Lactic Acid, Venous: 4.6 mmol/L (ref 0.5–1.9)
Lactic Acid, Venous: 1.8 mmol/L (ref 0.5–1.9)

## 2021-04-24 LAB — TROPONIN I (HIGH SENSITIVITY)
Troponin I (High Sensitivity): 1766 ng/L (ref <18)
Troponin I (High Sensitivity): 2284 ng/L (ref <18)

## 2021-04-24 SURGERY — TRANSPOSITION, VEIN, BASILIC
Anesthesia: Monitor Anesthesia Care | Site: Arm Upper | Laterality: Left

## 2021-04-24 MED ORDER — LIDOCAINE-EPINEPHRINE (PF) 1 %-1:200000 IJ SOLN: INTRAMUSCULAR | Status: DC | PRN

## 2021-04-24 MED ORDER — ROPIVACAINE HCL 5 MG/ML IJ SOLN
INTRAMUSCULAR | Status: DC | PRN
  Administered 2021-04-24: 25 mL via PERINEURAL

## 2021-04-24 MED ORDER — CHLORHEXIDINE GLUCONATE 0.12 % MT SOLN
OROMUCOSAL | Status: AC
  Filled 2021-04-24: qty 15

## 2021-04-24 MED ORDER — ACETAMINOPHEN 500 MG PO TABS
ORAL_TABLET | ORAL | Status: AC
  Administered 2021-04-24: 1000 mg via ORAL
  Filled 2021-04-24: qty 2

## 2021-04-24 MED ORDER — NOREPINEPHRINE 4 MG/250ML-% IV SOLN
INTRAVENOUS | Status: DC | PRN
  Administered 2021-04-24: 4 ug/min via INTRAVENOUS

## 2021-04-24 MED ORDER — VASOPRESSIN 20 UNIT/ML IV SOLN
INTRAVENOUS | Status: DC | PRN
  Administered 2021-04-24: 1 [IU] via INTRAVENOUS
  Administered 2021-04-24 (×2): 2 [IU] via INTRAVENOUS
  Administered 2021-04-24: .5 [IU] via INTRAVENOUS
  Administered 2021-04-24 (×3): 1 [IU] via INTRAVENOUS
  Administered 2021-04-24: .5 [IU] via INTRAVENOUS
  Administered 2021-04-24: 1 [IU] via INTRAVENOUS

## 2021-04-24 MED ORDER — FENTANYL CITRATE (PF) 250 MCG/5ML IJ SOLN
INTRAMUSCULAR | Status: AC
  Filled 2021-04-24: qty 5

## 2021-04-24 MED ORDER — EPHEDRINE 5 MG/ML INJ
INTRAVENOUS | Status: AC
  Filled 2021-04-24: qty 10

## 2021-04-24 MED ORDER — FENTANYL CITRATE (PF) 250 MCG/5ML IJ SOLN
INTRAMUSCULAR | Status: DC | PRN
Start: 2021-04-24 — End: 2021-04-24
  Administered 2021-04-24: 25 ug via INTRAVENOUS

## 2021-04-24 MED ORDER — CALCIUM CHLORIDE 10 % IV SOLN
INTRAVENOUS | Status: DC | PRN
  Administered 2021-04-24: 400 mg via INTRAVENOUS

## 2021-04-24 MED ORDER — 0.9 % SODIUM CHLORIDE (POUR BTL) OPTIME
TOPICAL | Status: DC | PRN
  Administered 2021-04-24: 1000 mL

## 2021-04-24 MED ORDER — CALCIUM GLUCONATE-NACL 1-0.675 GM/50ML-% IV SOLN
1.0000 g | Freq: Once | INTRAVENOUS | Status: AC
  Administered 2021-04-24: 1000 mg via INTRAVENOUS
  Filled 2021-04-24: qty 50

## 2021-04-24 MED ORDER — PHENYLEPHRINE HCL-NACL 20-0.9 MG/250ML-% IV SOLN
INTRAVENOUS | Status: DC | PRN
  Administered 2021-04-24: 60 ug/min via INTRAVENOUS

## 2021-04-24 MED ORDER — CHLORHEXIDINE GLUCONATE 0.12 % MT SOLN: 15.0000 mL | Freq: Once | OROMUCOSAL | Status: AC

## 2021-04-24 MED ORDER — ATROPINE SULFATE 0.4 MG/ML IV SOLN
INTRAVENOUS | Status: DC | PRN
  Administered 2021-04-24: .4 mg via INTRAVENOUS

## 2021-04-24 MED ORDER — INSULIN ASPART 100 UNIT/ML IJ SOLN
0.0000 [IU] | INTRAMUSCULAR | Status: DC
  Administered 2021-04-24 – 2021-04-25 (×2): 1 [IU] via SUBCUTANEOUS
  Administered 2021-04-25: 2 [IU] via SUBCUTANEOUS
  Administered 2021-04-26: 1 [IU] via SUBCUTANEOUS
  Administered 2021-04-27: 2 [IU] via SUBCUTANEOUS
  Administered 2021-04-27: 1 [IU] via SUBCUTANEOUS
  Administered 2021-04-27 (×2): 3 [IU] via SUBCUTANEOUS
  Administered 2021-04-27: 1 [IU] via SUBCUTANEOUS
  Administered 2021-04-27: 3 [IU] via SUBCUTANEOUS
  Administered 2021-04-28: 5 [IU] via SUBCUTANEOUS
  Administered 2021-04-28: 6 [IU] via SUBCUTANEOUS

## 2021-04-24 MED ORDER — GLYCOPYRROLATE PF 0.2 MG/ML IJ SOSY
PREFILLED_SYRINGE | INTRAMUSCULAR | Status: DC | PRN
  Administered 2021-04-24 (×2): .2 mg via INTRAVENOUS

## 2021-04-24 MED ORDER — SODIUM CHLORIDE 0.9 % IV SOLN
250.0000 mL | INTRAVENOUS | Status: DC
  Administered 2021-04-28: 250 mL via INTRAVENOUS

## 2021-04-24 MED ORDER — HEPARIN 6000 UNIT IRRIGATION SOLUTION
Status: AC
  Filled 2021-04-24: qty 500

## 2021-04-24 MED ORDER — FENTANYL CITRATE PF 50 MCG/ML IJ SOSY
50.0000 ug | PREFILLED_SYRINGE | INTRAMUSCULAR | Status: DC | PRN
  Administered 2021-04-26 – 2021-04-27 (×2): 50 ug via INTRAVENOUS
  Administered 2021-04-28: 100 ug via INTRAVENOUS
  Administered 2021-04-28 (×3): 50 ug via INTRAVENOUS
  Filled 2021-04-24 (×2): qty 1
  Filled 2021-04-24: qty 2
  Filled 2021-04-24: qty 1
  Filled 2021-04-24 (×2): qty 2

## 2021-04-24 MED ORDER — SODIUM CHLORIDE 0.9 % IV SOLN
INTRAVENOUS | Status: DC
Start: 2021-04-24 — End: 2021-04-24

## 2021-04-24 MED ORDER — LIDOCAINE-EPINEPHRINE (PF) 1 %-1:200000 IJ SOLN
INTRAMUSCULAR | Status: AC
  Filled 2021-04-24: qty 30

## 2021-04-24 MED ORDER — MIDAZOLAM HCL 2 MG/2ML IJ SOLN
INTRAMUSCULAR | Status: DC | PRN
  Administered 2021-04-24: .5 mg via INTRAVENOUS

## 2021-04-24 MED ORDER — LIDOCAINE 2% (20 MG/ML) 5 ML SYRINGE
INTRAMUSCULAR | Status: AC
  Filled 2021-04-24: qty 5

## 2021-04-24 MED ORDER — PANTOPRAZOLE 2 MG/ML SUSPENSION
40.0000 mg | Freq: Every day | ORAL | Status: DC
  Administered 2021-04-25 – 2021-04-28 (×4): 40 mg
  Filled 2021-04-24 (×4): qty 20

## 2021-04-24 MED ORDER — GLYCOPYRROLATE PF 0.2 MG/ML IJ SOSY
PREFILLED_SYRINGE | INTRAMUSCULAR | Status: AC
  Filled 2021-04-24: qty 1

## 2021-04-24 MED ORDER — PHENYLEPHRINE 80 MCG/ML (10ML) SYRINGE FOR IV PUSH (FOR BLOOD PRESSURE SUPPORT)
PREFILLED_SYRINGE | INTRAVENOUS | Status: AC
  Filled 2021-04-24: qty 10

## 2021-04-24 MED ORDER — FENTANYL CITRATE (PF) 100 MCG/2ML IJ SOLN
INTRAMUSCULAR | Status: AC
  Filled 2021-04-24: qty 2

## 2021-04-24 MED ORDER — POLYETHYLENE GLYCOL 3350 17 G PO PACK
17.0000 g | PACK | Freq: Every day | ORAL | Status: DC
  Administered 2021-04-24 – 2021-04-28 (×5): 17 g
  Filled 2021-04-24 (×5): qty 1

## 2021-04-24 MED ORDER — ACETAMINOPHEN 160 MG/5ML PO SOLN
325.0000 mg | Freq: Four times a day (QID) | ORAL | Status: DC
  Administered 2021-04-24 – 2021-04-28 (×12): 325 mg
  Filled 2021-04-24 (×13): qty 20.3

## 2021-04-24 MED ORDER — ROCURONIUM BROMIDE 10 MG/ML (PF) SYRINGE
PREFILLED_SYRINGE | INTRAVENOUS | Status: DC | PRN
  Administered 2021-04-24: 60 mg via INTRAVENOUS

## 2021-04-24 MED ORDER — ACETAMINOPHEN 500 MG PO TABS: 1000.0000 mg | ORAL_TABLET | Freq: Once | ORAL | Status: AC

## 2021-04-24 MED ORDER — DOCUSATE SODIUM 50 MG/5ML PO LIQD
100.0000 mg | Freq: Two times a day (BID) | ORAL | Status: DC
  Administered 2021-04-24 – 2021-04-28 (×8): 100 mg
  Filled 2021-04-24 (×8): qty 10

## 2021-04-24 MED ORDER — ORAL CARE MOUTH RINSE
15.0000 mL | Freq: Once | OROMUCOSAL | Status: AC
  Administered 2021-04-24: 15 mL via OROMUCOSAL

## 2021-04-24 MED ORDER — MIDAZOLAM HCL 2 MG/2ML IJ SOLN
INTRAMUSCULAR | Status: AC
  Filled 2021-04-24: qty 2

## 2021-04-24 MED ORDER — NOREPINEPHRINE 4 MG/250ML-% IV SOLN
2.0000 ug/min | INTRAVENOUS | Status: DC
  Administered 2021-04-24: 5 ug/min via INTRAVENOUS
  Filled 2021-04-24: qty 250

## 2021-04-24 MED ORDER — EPHEDRINE SULFATE-NACL 50-0.9 MG/10ML-% IV SOSY
PREFILLED_SYRINGE | INTRAVENOUS | Status: DC | PRN
Start: 2021-04-24 — End: 2021-04-24
  Administered 2021-04-24: 10 mg via INTRAVENOUS
  Administered 2021-04-24: 5 mg via INTRAVENOUS
  Administered 2021-04-24: 15 mg via INTRAVENOUS
  Administered 2021-04-24: 10 mg via INTRAVENOUS

## 2021-04-24 MED ORDER — PROPOFOL 1000 MG/100ML IV EMUL
0.0000 ug/kg/min | INTRAVENOUS | Status: DC
  Administered 2021-04-25: 10 ug/kg/min via INTRAVENOUS
  Filled 2021-04-24: qty 100

## 2021-04-24 MED ORDER — EPINEPHRINE 1 MG/10ML IJ SOSY
PREFILLED_SYRINGE | INTRAMUSCULAR | Status: DC | PRN
  Administered 2021-04-24 (×2): 20 ug via INTRAVENOUS
  Administered 2021-04-24: 50 ug via INTRAVENOUS
  Administered 2021-04-24: 20 ug via INTRAVENOUS
  Administered 2021-04-24: 10 ug via INTRAVENOUS
  Administered 2021-04-24: 50 ug via INTRAVENOUS
  Administered 2021-04-24: 20 ug via INTRAVENOUS
  Administered 2021-04-24: 10 ug via INTRAVENOUS

## 2021-04-24 MED ORDER — FENTANYL CITRATE PF 50 MCG/ML IJ SOSY
50.0000 ug | PREFILLED_SYRINGE | INTRAMUSCULAR | Status: AC | PRN
  Administered 2021-04-24 – 2021-04-28 (×3): 50 ug via INTRAVENOUS
  Filled 2021-04-24 (×6): qty 1

## 2021-04-24 MED ORDER — CALCIUM CHLORIDE 10 % IV SOLN
INTRAVENOUS | Status: AC
  Filled 2021-04-24: qty 10

## 2021-04-24 MED ORDER — HEPARIN 6000 UNIT IRRIGATION SOLUTION
Status: DC | PRN
  Administered 2021-04-24: 1

## 2021-04-24 MED ORDER — CEFAZOLIN SODIUM-DEXTROSE 2-4 GM/100ML-% IV SOLN
2.0000 g | Freq: Once | INTRAVENOUS | Status: AC
  Administered 2021-04-24: 2 g via INTRAVENOUS
  Filled 2021-04-24: qty 100

## 2021-04-24 MED ORDER — ORAL CARE MOUTH RINSE
15.0000 mL | OROMUCOSAL | Status: DC
  Administered 2021-04-24 – 2021-04-28 (×33): 15 mL via OROMUCOSAL

## 2021-04-24 MED ORDER — AMIODARONE HCL 200 MG PO TABS
200.0000 mg | ORAL_TABLET | Freq: Two times a day (BID) | ORAL | Status: DC
Start: 2021-04-25 — End: 2021-04-28
  Administered 2021-04-25 – 2021-04-28 (×7): 200 mg
  Filled 2021-04-24 (×7): qty 1

## 2021-04-24 MED ORDER — PROPOFOL 500 MG/50ML IV EMUL
INTRAVENOUS | Status: DC | PRN
  Administered 2021-04-24: 85 ug/kg/min via INTRAVENOUS

## 2021-04-24 MED ORDER — VASOPRESSIN 20 UNIT/ML IV SOLN
INTRAVENOUS | Status: AC
  Filled 2021-04-24: qty 1

## 2021-04-24 SURGICAL SUPPLY — 47 items
ADH SKN CLS APL DERMABOND .7 (GAUZE/BANDAGES/DRESSINGS) ×1
ADH SKN CLS LQ APL DERMABOND (GAUZE/BANDAGES/DRESSINGS) ×1
ARMBAND PINK RESTRICT EXTREMIT (MISCELLANEOUS) ×3 IMPLANT
BAG COUNTER SPONGE SURGICOUNT (BAG) ×3 IMPLANT
BAG SPNG CNTER NS LX DISP (BAG) ×1
CANISTER SUCT 3000ML PPV (MISCELLANEOUS) ×3 IMPLANT
CATH EMB 3FR 40CM (CATHETERS) ×1 IMPLANT
CLIP VESOCCLUDE MED 24/CT (CLIP) IMPLANT
CLIP VESOCCLUDE MED 6/CT (CLIP) IMPLANT
CLIP VESOCCLUDE SM WIDE 24/CT (CLIP) ×1 IMPLANT
CLIP VESOCCLUDE SM WIDE 6/CT (CLIP) ×1 IMPLANT
COVER PROBE W GEL 5X96 (DRAPES) ×3 IMPLANT
DERMABOND ADHESIVE PROPEN (GAUZE/BANDAGES/DRESSINGS) ×1
DERMABOND ADVANCED (GAUZE/BANDAGES/DRESSINGS) ×1
DERMABOND ADVANCED .7 DNX12 (GAUZE/BANDAGES/DRESSINGS) ×2 IMPLANT
DERMABOND ADVANCED .7 DNX6 (GAUZE/BANDAGES/DRESSINGS) IMPLANT
ELECT REM PT RETURN 9FT ADLT (ELECTROSURGICAL) ×2
ELECTRODE REM PT RTRN 9FT ADLT (ELECTROSURGICAL) ×2 IMPLANT
GLOVE SURG SS PI 7.5 STRL IVOR (GLOVE) ×9 IMPLANT
GOWN STRL REUS W/ TWL LRG LVL3 (GOWN DISPOSABLE) ×4 IMPLANT
GOWN STRL REUS W/ TWL XL LVL3 (GOWN DISPOSABLE) ×2 IMPLANT
GOWN STRL REUS W/TWL LRG LVL3 (GOWN DISPOSABLE) ×4
GOWN STRL REUS W/TWL XL LVL3 (GOWN DISPOSABLE) ×2
HEMOSTAT SNOW SURGICEL 2X4 (HEMOSTASIS) IMPLANT
KIT BASIN OR (CUSTOM PROCEDURE TRAY) ×3 IMPLANT
KIT TURNOVER KIT B (KITS) ×3 IMPLANT
NDL 18GX1X1/2 (RX/OR ONLY) (NEEDLE) ×2 IMPLANT
NEEDLE 18GX1X1/2 (RX/OR ONLY) (NEEDLE) ×2 IMPLANT
NS IRRIG 1000ML POUR BTL (IV SOLUTION) ×3 IMPLANT
PACK CV ACCESS (CUSTOM PROCEDURE TRAY) ×3 IMPLANT
PAD ARMBOARD 7.5X6 YLW CONV (MISCELLANEOUS) ×6 IMPLANT
SLING ARM FOAM STRAP LRG (SOFTGOODS) IMPLANT
SLING ARM FOAM STRAP MED (SOFTGOODS) IMPLANT
STOPCOCK 4 WAY LG BORE MALE ST (IV SETS) ×1 IMPLANT
SUT PROLENE 5 0 C 1 24 (SUTURE) ×2 IMPLANT
SUT PROLENE 6 0 BV (SUTURE) ×2 IMPLANT
SUT PROLENE 6 0 CC (SUTURE) ×3 IMPLANT
SUT SILK 2 0 SH (SUTURE) IMPLANT
SUT VIC AB 2-0 CT1 27 (SUTURE) ×2
SUT VIC AB 2-0 CT1 TAPERPNT 27 (SUTURE) IMPLANT
SUT VIC AB 3-0 SH 27 (SUTURE) ×4
SUT VIC AB 3-0 SH 27X BRD (SUTURE) ×2 IMPLANT
SUT VICRYL 4-0 PS2 18IN ABS (SUTURE) ×3 IMPLANT
SYR 30ML LL (SYRINGE) ×3 IMPLANT
TOWEL GREEN STERILE (TOWEL DISPOSABLE) ×3 IMPLANT
UNDERPAD 30X36 HEAVY ABSORB (UNDERPADS AND DIAPERS) ×3 IMPLANT
WATER STERILE IRR 1000ML POUR (IV SOLUTION) ×3 IMPLANT

## 2021-04-24 NOTE — Anesthesia Procedure Notes (Signed)
Anesthesia Regional Block: Supraclavicular block  ? ?Pre-Anesthetic Checklist: , timeout performed,  Correct Patient, Correct Site, Correct Laterality,  Correct Procedure, Correct Position, site marked,  Risks and benefits discussed,  Surgical consent,  Pre-op evaluation,  At surgeon's request and post-op pain management ? ?Laterality: Left ? ?Prep: chloraprep     ?  ?Needles:  ?Injection technique: Single-shot ? ?Needle Type: Echogenic Stimulator Needle   ? ? ?Needle Length: 5cm  ?Needle Gauge: 22  ? ? ? ?Additional Needles: ? ? ?Procedures:, nerve stimulator,,,,,    ? ?Nerve Stimulator or Paresthesia:  ?Response: biceps flexion, 0.45 mA ? ?Additional Responses:  ? ?Narrative:  ?Start time: 04/27/2021 3:35 PM ?End time: 04/10/2021 3:45 PM ?Injection made incrementally with aspirations every 5 mL. ? ?Performed by: Personally  ?Anesthesiologist: Albertha Ghee, MD ? ?Additional Notes: ?Functioning IV was confirmed and monitors were applied.  A 31mm 22ga Arrow echogenic stimulator needle was used. Sterile prep and drape,hand hygiene and sterile gloves were used.  Negative aspiration and negative test dose prior to incremental administration of local anesthetic. The patient tolerated the procedure well. ? ?Ultrasound guidance: relevent anatomy identified, needle position confirmed, local anesthetic spread visualized around nerve(s), vascular puncture avoided.  Image printed for medical record.  ? ? ? ? ?

## 2021-04-24 NOTE — Anesthesia Procedure Notes (Signed)
Procedure Name: Intubation ?Date/Time: 04/19/2021 4:51 PM ?Performed by: Cathren Harsh, CRNA ?Pre-anesthesia Checklist: Patient identified, Emergency Drugs available, Suction available and Patient being monitored ?Patient Re-evaluated:Patient Re-evaluated prior to induction ?Oxygen Delivery Method: Circle System Utilized ?Preoxygenation: Pre-oxygenation with 100% oxygen ?Induction Type: IV induction ?Ventilation: Mask ventilation without difficulty ?Laryngoscope Size: Mac and 3 ?Grade View: Grade I ?Tube type: Oral ?Tube size: 7.0 mm ?Number of attempts: 1 ?Airway Equipment and Method: Stylet and Oral airway ?Placement Confirmation: ETT inserted through vocal cords under direct vision, positive ETCO2 and breath sounds checked- equal and bilateral ?Secured at: 21 cm ?Tube secured with: Tape ?Dental Injury: Teeth and Oropharynx as per pre-operative assessment  ?Comments: Ann Bridegroom, crna performing intubation. ? ? ? ? ?

## 2021-04-24 NOTE — Progress Notes (Addendum)
?[facility] ?Progress Note  ? ?Subjective:  Seen in room. No overnight complaints, no CP/dyspnea. For AVF surgery later today, per notes. ? ?Objective ?Vitals:  ? 05/04/2021 2157 04/08/2021 0504 04/25/2021 5885 04/06/2021 0806  ?BP: 127/66 (!) 145/75 (!) 146/75 117/66  ?Pulse:  72  74  ?Resp:  18  18  ?Temp:  98.1 ?F (36.7 ?C)  98.3 ?F (36.8 ?C)  ?TempSrc:  Oral  Axillary  ?SpO2:  96%  95%  ?Weight:  60.7 kg    ?Height:      ? 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 in R chest ? ?Additional Objective ?Labs: ?Basic Metabolic Panel: ?Recent Labs  ?Lab 04/22/21 ?0118 04/30/2021 ?0142 04/07/2021 ?0277  ?NA 135 136 132*  ?K 3.6 4.5 4.7  ?CL 98 98 97*  ?CO2 _0 ?GLUCOSE 154* 146* 175*  ?BUN 80* 44* 31*  ?CREATININE 13.05* 8.98* 7.06*  ?CALCIUM 8.1* 8.0* 8.1*  ?PHOS 6.9* 5.3* 5.8*  ? ?Liver Function Tests: ?Recent Labs  ?Lab 05/03/2021 ?0028 04/19/21 ?4128 04/20/21 ?0103 04/22/21 ?0118 05/02/2021 ?0142 04/08/2021 ?7867  ?AST 16 13*  --   --   --   --   ?ALT 17 15  --   --   --   --   ?ALKPHOS 192* 170*  --   --   --   --   ?BILITOT 0.9 0.6  --   --   --   --   ?PROT 4.0* 4.3*  --   --   --   --   ?ALBUMIN <1.5* <1.5*   < > <1.5* <1.5* <1.5*  ? < > = values in this interval not displayed.  ? ?CBC: ?Recent Labs  ?Lab 04/19/21 ?6720 04/21/21 ?9470 04/22/21 ?0118 04/06/2021 ?0142 04/21/2021 ?0128  ?WBC 12.5* 11.9* 12.9* 14.9* 17.0*  ?HGB 13.6 11.8* 12.0 12.4 11.4*  ?HCT 40.3 36.2 37.0 39.0 36.5  ?MCV 83.8 85.2 86.9 88.0 88.2  ?PLT 304 413* 421* 413* 401*  ? ?Cardiac Enzymes: ?Recent Labs  ?Lab 04/20/2021 ?0028  ?CKTOTAL 378*  ? ?CBG: ?Recent Labs  ?Lab 04/27/2021 ?1255 05/05/2021 ?1650 04/22/2021 ?2131 04/14/2021 ?2151 04/10/2021 ?9628  ?GLUCAP 116* 132* 65* 105* 171*  ? ?Iron Studies:  ?Recent Labs  ?  04/22/2021 ?0142  ?IRON 30  ?TIBC  139*  ?FERRITIN 2,086*  ? ?Studies/Results: ?PERIPHERAL VASCULAR CATHETERIZATION ? ?Result Date: 04/29/2021 ?Images from the original result were not included. Patient name: Ann Bryan MRN: [id number] DOB: 10/17/1960 Sex: female 04/25/2021 Pre-operative Diagnosis: ESRD Post-operative diagnosis:  Same Surgeon:  [name] Procedure Performed:  1.  Ultrasound-guided access, left cephalic vein  2.  Shuntogram  3.  Ultrasound-guided access, left brachial vein  4.  Central venogram  5.  Conscious sedation, 16 minutes  Indications: This is a 61 year old female with end-stage renal disease.  She is transitioning from peritoneal dialysis to hemodialysis.  She has a left brachiocephalic fistula which is pulsatile.  She comes in today for further evaluation. Procedure:  The patient was identified in the holding area and taken to room 8.  The patient was then placed supine on the table and prepped and draped in the usual sterile fashion.  A time out was called.  Conscious sedation was administered with the use of IV fentanyl and Versed under continuous physician and nurse monitoring.  Heart rate, blood pressure, and oxygen saturations were continuously monitored.  Total sedation time was 16 minutes ultrasound was used to evaluate the fistula.  The vein was patent and compressible.  A digital ultrasound image was acquired.  The fistula was then accessed under ultrasound guidance using a micropuncture needle.  An 018 wire was then asvanced without resistance and a micropuncture sheath was placed.  Contrast injections were then performed through the sheath. Findings: The cephalic vein fistula appears to be patent without stenosis in the upper arm.  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  I did not see any reconstitution of the central veins once the fistula occluded.  Next, I elected to cannulate the brachial vein.  This was done under ultrasound guidance with a micropuncture needle.  A micropuncture sheath was placed over a [place]  wire and a left arm venogram was performed which showed a patent central venous system with small appearing brachial veins.  Intervention:  none Impression:  #1  Left cephalic vein fistula is occluded within the chest.  I did not see any central vein reconstitution from the fistulogram and so I cannulated the brachial vein and performed a venogram which did show a patent subclavian and innominate vein draining back into the heart.  The brachial veins did appear small in the arm.  I think the next step is to proceed with a cephalic vein turndown. Theotis Burrow, M.D., [facility] Office: [phone] Pager:  [phone]  ? ?IR Fluoro Guide CV Line Right ? ?Result Date: 04/22/2021 ?INDICATION: 61 year old female history of end-stage renal disease requiring central venous access for hemodialysis. EXAM: TUNNELED CENTRAL VENOUS HEMODIALYSIS CATHETER PLACEMENT WITH ULTRASOUND AND FLUOROSCOPIC GUIDANCE MEDICATIONS: Ancef 2 gm IV . The antibiotic was given in an appropriate time interval prior to skin puncture. ANESTHESIA/SEDATION: Moderate (conscious) sedation was employed during this procedure. A total of Versed 0 mg and Fentanyl 0 mcg was administered intravenously. Moderate Sedation Time: 0 minutes. The patient's level of consciousness and vital signs were monitored continuously by radiology nursing throughout the procedure under my direct supervision. FLUOROSCOPY TIME:  0 minutes 6 seconds (1 mGy). COMPLICATIONS: None immediate. PROCEDURE: Informed written consent was obtained from the patient after a discussion of the risks, benefits, and alternatives to treatment. Questions regarding the procedure were encouraged and answered. The right neck and chest were prepped with chlorhexidine in a sterile fashion, and a sterile drape was applied covering the operative field. Maximum barrier sterile technique with sterile gowns and gloves were used for the procedure. A timeout was  performed prior to the initiation of the procedure. After creating a small venotomy incision, a 21 gauge micropuncture kit was utilized to access the internal jugular vein. Real-time ultrasound guidance was utilized for vascular access including the acquisition of a permanent ultrasound image documenting patency of the accessed vessel. A Rosen wire was advanced to the level of the IVC and the micropuncture sheath was exchanged for an 8 Fr dilator. A 14.5 French tunneled hemodialysis catheter measuring 19 cm from tip to cuff was tunneled in a retrograde fashion from the anterior chest wall to the venotomy incision. Serial dilation was then performed an a peel-away sheath was placed. The catheter was then placed through the peel-away sheath with the catheter tip ultimately positioned within the right atrium. Final catheter positioning was confirmed and documented with a spot radiographic image. The catheter aspirates and flushes normally. The catheter was flushed with appropriate volume heparin dwells. The catheter  exit site was secured with a 0-Silk retention suture. The venotomy incision was closed with Dermabond. Sterile dressings were applied. The patient tolerated the procedure well without immediate post procedural complication. IMPRESSION: Successful placement of 19 cm tip to cuff tunneled hemodialysis catheter via the right internal jugular vein with catheter tip terminating within the right atrium. The catheter is ready for immediate use. Ruthann Cancer, MD [facility] Electronically Signed   By: Ruthann Cancer M.D.   On: 04/22/2021 13:50  ? ?IR US Guide Vasc Access Right ? ?Result Date: 04/22/2021 ?INDICATION: 61 year old female history of end-stage renal disease requiring central venous access for hemodialysis. EXAM: TUNNELED CENTRAL VENOUS HEMODIALYSIS CATHETER PLACEMENT WITH ULTRASOUND AND FLUOROSCOPIC GUIDANCE MEDICATIONS: Ancef 2 gm IV . The  antibiotic was given in an appropriate time interval prior to skin puncture. ANESTHESIA/SEDATION: Moderate (conscious) sedation was employed during this procedure. A total of Versed 0 mg and Fentanyl 0 mcg was adminis

## 2021-04-24 NOTE — Assessment & Plan Note (Signed)
-   Volume control by dialysis ?

## 2021-04-24 NOTE — Progress Notes (Incomplete)
HPI ? ?This is a 61 year old female with past medical history of DM, HTN, HLD, neuropathy, ESRD on PD.  She presented to [facility] ED 04/22/2021 with generalized weakness and syncope. ? ?In ED, she was found to have NSTEMI.  Her syncopal episode was felt to be due to hypoglycemia after her insulin regimen was adjusted recently before this episode. ? ?She was admitted by [facility].  She was seen in consultation by cardiology after she was found to have new systolic CHF and possible cardiogenic shock in the setting of NSTEMI.  Right left heart cath showed severe three-vessel CAD for which PCI was not felt to be a great option nor was mechanical support.  It was recommended to manage NSTEMI medically though very little room for GDMT. ? ?Palliative care was consulted 4/14 due to her multiple comorbidities and limited options.  After discussions, patient requested to continue with full code status. ? ?IR consulted 4/16 for tunneled HD cath placement as it was felt that she was not tolerating PD well at home.  4/17, vascular surgery consulted for evaluation of new access either AVF versus AVG for long-term HD access. ? ?4/19, she was taken to the operating room for new AVF placement.  Either peri or postop, she developed bradycardia and hypotension.  She was subsequently intubated due to concerns for near arrest.  PCCM was called in consultation and for assistance in the ICU. ?

## 2021-04-24 NOTE — Assessment & Plan Note (Signed)
BP controlled ?-Continue Coreg, hydralazine, Imdur ?-Hold amlodipine, Losartan, clondiine ?

## 2021-04-24 NOTE — Progress Notes (Signed)
?Progress Note ? ? ?Patient: Ann Bryan YIF:[id number] DOB: 09-07-1960 DOA: 04/19/2021     8 ?DOS: the patient was seen and examined on 05/04/2021 at 1:15PM ?  ? ? ? ?Brief hospital course: ?Mrs. Hattery is a 61 y.o. F with ESRD on PD prior to admission, DM with neuropathy, HTN, and HLD who presented with several days severe generalized weakness then syncopal episode, passing out on the floor.  After passing out, she was too weak to get up and spent the night on the floor.  A neighbor called EMS who arrived to find the patient on the floor.  ? ?In the ER she was hypothermic. ? ? ? ? ?Assessment and Plan: ?* ESRD (end stage renal disease) on dialysis [facility]) ?Right chest dialysis catheter placed in IR 4/17.   ?- HD per nephrology ?- Consult Vascular surgery for access, plan for cephalic vein turn down, unclear timing ?- Remove PD catheter as outpatient ? ?NSTEMI (non-ST elevated myocardial infarction) ([place]) ?-Continue aspirin, atorvastatin, Coreg ?-Hold home losartan for now ? ?Hypothermia ?Due to CHF and renal failure.  Sepsis ruled out. ? ?Neuropathy of foot ?-Continue gabapentin, low dose due to HD ? ?Supraventricular tachycardia ([place]) ?Brief episodes of SVT with associated hypotension int he cath lab noted.  Started on amiodarone at that time, hopefully this will be for short term use, stopped at office follow up. ?-Continue Coreg, amiodarone ? ?Failure to thrive in adult ?- Consult Palliative Care ? ?Coronary artery disease involving native coronary artery of native heart with angina pectoris ([place]) ?See above ? ?Acute systolic CHF (congestive heart failure) ([place]) ?- Volume control by dialysis ? ?Dyslipidemia ?-Continue statin ? 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 ?Anemia in chronic kidney disease (CKD) ?Folate, iron and B12 replete. ? ?Essential hypertension ?BP controlled ?-Continue  Coreg, hydralazine, Imdur ?-Hold amlodipine, Losartan, clondiine ? ? ? ? ? ? ? ? ? ?Subjective: Patient has something uncomfortable underneath her.  She has bilateral foot pain.  She feels weak, and sleepy. ? ? ? ? ?Physical Exam: ?Vitals:  ? 04/11/2021 0504 04/15/2021 0623 04/21/2021 0806 04/06/2021 1224  ?BP: (!) 145/75 (!) 146/75 117/66 127/69  ?Pulse: 72  74 74  ?Resp: 18  18 17   ?Temp: 98.1 ?F (36.7 ?C)  98.3 ?F (36.8 ?C) 98 ?F (36.7 ?C)  ?TempSrc: Oral  Axillary Oral  ?SpO2: 96%  95% 92%  ?Weight: 60.7 kg     ?Height:      ? ?Thin adult female, lying in bed, appears debilitated, very weak, trouble sitting up by herself.  Oriented to person, place, time, and situation.  RRR, no murmurs, no peripheral edema, respiratory rate normal, lungs clear without rales or wheezes.  Attention diminished, affect blunted, seems sleepy, slightly sedated from medications.   ? ?Data Reviewed: ?Discussed with cardiology.  Nephrology and vascular surgery notes reviewed.  Op notes reviewed.  Nursing notes and vital signs reviewed. ?Labs are notable for well-controlled glucose ?White blood cell count up to 17 K, folate, iron and B12 levels normal ?Sodium 132, BUN 31, creatinine 7, at baseline ?Phosphate elevated ?Albumin less than 1.5 ? ?Family Communication:  ? ? ? ?Disposition: ?Status is: Inpatient ?Remains inpatient appropriate because:  ?She was admitted with weakness, found to be from severe heart failure, uremia, and medication polypharmacy. ? ?This issues been stabilized, and once safe placement can be found, the patient is medically ready for  discharge to SNF. ? ? ? ? ? ? ? ? ? ? ?Author: ?Edwin Dada, MD ?04/07/2021 2:30 PM ? ?For on call review [email or web address].  ? ? ?

## 2021-04-24 NOTE — Assessment & Plan Note (Addendum)
Right chest dialysis catheter placed in IR 4/17.   ?- HD per nephrology ?- Consult Vascular surgery for access, plan for cephalic vein turn down, unclear timing ?- Remove PD catheter as outpatient ?

## 2021-04-24 NOTE — Progress Notes (Signed)
Received protocol consult for US guided IV due to vasopressor order. Pt currently has US guided IV. Consult cleared. ?

## 2021-04-24 NOTE — Assessment & Plan Note (Addendum)
-  Continue gabapentin, low dose due to HD ?

## 2021-04-24 NOTE — Progress Notes (Signed)
? ? ?  Subjective  -  ? ?No complaints ? ? ?Physical Exam: ? ?Palpable pulse within fistula ? ? ? ? ? ? ?Assessment/Plan:   ? ?Discussed cephalic vein turndown vs interposition graft. ? ?Ann Bryan ?04/25/2021 ?3:04 PM ?-- ? ?Vitals:  ? 04/21/2021 0806 05/03/2021 1224  ?BP: 117/66 127/69  ?Pulse: 74 74  ?Resp: 18 17  ?Temp: 98.3 ?F (36.8 ?C) 98 ?F (36.7 ?C)  ?SpO2: 95% 92%  ? ? ?Intake/Output Summary (Last 24 hours) at 04/06/2021 1504 ?Last data filed at 05/01/2021 0507 ?Gross per 24 hour  ?Intake 240 ml  ?Output 0 ml  ?Net 240 ml  ? ? ? ?Laboratory ?CBC ?   ?Component Value Date/Time  ? WBC 17.0 (H) 04/11/2021 0128  ? HGB 11.4 (L) 05/01/2021 0128  ? HGB 12.1 01/16/2021 1018  ? HCT 36.5 04/25/2021 0128  ? HCT 38.1 01/16/2021 1018  ? PLT 401 (H) 04/27/2021 0128  ? PLT 303 01/16/2021 1018  ? ? ?BMET ?   ?Component Value Date/Time  ? NA 132 (L) 04/08/2021 0454  ? NA 143 11/02/2017 1651  ? K 4.7 04/09/2021 0454  ? CL 97 (L) 04/08/2021 0454  ? CO2 24 04/07/2021 0454  ? GLUCOSE 175 (H) 04/14/2021 0454  ? BUN 31 (H) 04/17/2021 0454  ? BUN 64 (H) 11/02/2017 1651  ? CREATININE 7.06 (H) 04/27/2021 0454  ? CREATININE 1.69 (H) 12/10/2015 1145  ? CALCIUM 8.1 (L) 04/27/2021 0454  ? GFRNONAA 6 (L) 04/09/2021 0454  ? GFRNONAA 34 (L) 12/10/2015 1145  ? GFRAA 5 (L) 05/20/2019 1816  ? GFRAA 39 (L) 12/10/2015 1145  ? ? ?COAG ?Lab Results  ?Component Value Date  ? INR 1.1 11/15/2018  ? ?No results found for: PTT ? ?Antibiotics ?Anti-infectives (From admission, onward)  ? ? Start     Dose/Rate Route Frequency Ordered Stop  ? 04/22/21 1125  ceFAZolin (ANCEF) 2-4 GM/100ML-% IVPB       ?Note to Pharmacy: Kandy Garrison J: cabinet override  ?    04/22/21 1125 04/22/21 2329  ? 04/22/21 0000  ceFAZolin (ANCEF) IVPB 2g/100 mL premix       ? 2 g ?200 mL/hr over 30 Minutes Intravenous To Radiology 04/21/21 1409 04/22/21 1156  ? 04/17/21 1400  ceFEPIme (MAXIPIME) 1 g in sodium chloride 0.9 % 100 mL IVPB  Status:  Discontinued       ? 1 g ?200 mL/hr  over 30 Minutes Intravenous Every 24 hours 04/30/2021 1404 04/17/21 0843  ? 05/01/2021 1400  ceFEPIme (MAXIPIME) 2 g in sodium chloride 0.9 % 100 mL IVPB       ? 2 g ?200 mL/hr over 30 Minutes Intravenous  Once 04/17/2021 1352 05/01/2021 1530  ? 04/22/2021 1400  metroNIDAZOLE (FLAGYL) IVPB 500 mg       ? 500 mg ?100 mL/hr over 60 Minutes Intravenous  Once 04/08/2021 1352 04/12/2021 1533  ? 04/17/2021 1400  vancomycin (VANCOCIN) IVPB 1000 mg/200 mL premix       ? 1,000 mg ?200 mL/hr over 60 Minutes Intravenous  Once 04/22/2021 1352 04/27/2021 1947  ? ?  ? ? ? ?V. Leia Alf, M.D., FACS ?[facility] ?Office: [phone] ?Pager:  [phone]  ?

## 2021-04-24 NOTE — Assessment & Plan Note (Signed)
Folate, iron and B12 replete. ?

## 2021-04-24 NOTE — Assessment & Plan Note (Signed)
Due to CHF and renal failure.  Sepsis ruled out. ?

## 2021-04-24 NOTE — Assessment & Plan Note (Signed)
See above

## 2021-04-24 NOTE — H&P (View-Only) (Signed)
? ? ?  Subjective  -  ? ?No complaints ? ? ?Physical Exam: ? ?Palpable pulse within fistula ? ? ? ? ? ? ?Assessment/Plan:   ? ?Discussed cephalic vein turndown vs interposition graft. ? ?Ann Bryan ?04/25/2021 ?3:04 PM ?-- ? ?Vitals:  ? 04/11/2021 0806 04/15/2021 1224  ?BP: 117/66 127/69  ?Pulse: 74 74  ?Resp: 18 17  ?Temp: 98.3 ?F (36.8 ?C) 98 ?F (36.7 ?C)  ?SpO2: 95% 92%  ? ? ?Intake/Output Summary (Last 24 hours) at 04/19/2021 1504 ?Last data filed at 04/21/2021 0507 ?Gross per 24 hour  ?Intake 240 ml  ?Output 0 ml  ?Net 240 ml  ? ? ? ?Laboratory ?CBC ?   ?Component Value Date/Time  ? WBC 17.0 (H) 05/04/2021 0128  ? HGB 11.4 (L) 04/06/2021 0128  ? HGB 12.1 01/16/2021 1018  ? HCT 36.5 04/08/2021 0128  ? HCT 38.1 01/16/2021 1018  ? PLT 401 (H) 04/25/2021 0128  ? PLT 303 01/16/2021 1018  ? ? ?BMET ?   ?Component Value Date/Time  ? NA 132 (L) 05/01/2021 0454  ? NA 143 11/02/2017 1651  ? K 4.7 04/29/2021 0454  ? CL 97 (L) 04/15/2021 0454  ? CO2 24 05/03/2021 0454  ? GLUCOSE 175 (H) 05/04/2021 0454  ? BUN 31 (H) 04/29/2021 0454  ? BUN 64 (H) 11/02/2017 1651  ? CREATININE 7.06 (H) 05/05/2021 0454  ? CREATININE 1.69 (H) 12/10/2015 1145  ? CALCIUM 8.1 (L) 05/01/2021 0454  ? GFRNONAA 6 (L) 04/13/2021 0454  ? GFRNONAA 34 (L) 12/10/2015 1145  ? GFRAA 5 (L) 05/20/2019 1816  ? GFRAA 39 (L) 12/10/2015 1145  ? ? ?COAG ?Lab Results  ?Component Value Date  ? INR 1.1 11/15/2018  ? ?No results found for: PTT ? ?Antibiotics ?Anti-infectives (From admission, onward)  ? ? Start     Dose/Rate Route Frequency Ordered Stop  ? 04/22/21 1125  ceFAZolin (ANCEF) 2-4 GM/100ML-% IVPB       ?Note to Pharmacy: Kandy Garrison J: cabinet override  ?    04/22/21 1125 04/22/21 2329  ? 04/22/21 0000  ceFAZolin (ANCEF) IVPB 2g/100 mL premix       ? 2 g ?200 mL/hr over 30 Minutes Intravenous To Radiology 04/21/21 1409 04/22/21 1156  ? 04/17/21 1400  ceFEPIme (MAXIPIME) 1 g in sodium chloride 0.9 % 100 mL IVPB  Status:  Discontinued       ? 1 g ?200 mL/hr  over 30 Minutes Intravenous Every 24 hours 04/09/2021 1404 04/17/21 0843  ? 04/22/2021 1400  ceFEPIme (MAXIPIME) 2 g in sodium chloride 0.9 % 100 mL IVPB       ? 2 g ?200 mL/hr over 30 Minutes Intravenous  Once 05/01/2021 1352 05/03/2021 1530  ? 04/12/2021 1400  metroNIDAZOLE (FLAGYL) IVPB 500 mg       ? 500 mg ?100 mL/hr over 60 Minutes Intravenous  Once 04/25/2021 1352 04/10/2021 1533  ? 04/24/2021 1400  vancomycin (VANCOCIN) IVPB 1000 mg/200 mL premix       ? 1,000 mg ?200 mL/hr over 60 Minutes Intravenous  Once 04/08/2021 1352 05/04/2021 1947  ? ?  ? ? ? ?V. Leia Alf, M.D., FACS ?[facility]  ?Office: [phone] ?Pager:  [phone]  ?

## 2021-04-24 NOTE — Assessment & Plan Note (Signed)
-  Continue aspirin, atorvastatin, Coreg ?-Hold home losartan for now ?

## 2021-04-24 NOTE — Transfer of Care (Signed)
Immediate Anesthesia Transfer of Care Note ? ?Patient: Ann Bryan ? ?Procedure(s) Performed: LEFT ARM CEPHALIC  VEIN  TURNDOWN (Left: Arm Upper) ? ?Patient Location: ICU ? ?Anesthesia Type:MAC, General and Regional ? ?Level of Consciousness: unresponsive and Patient remains intubated per anesthesia plan ? ?Airway & Oxygen Therapy: Patient remains intubated per anesthesia plan ? ?Post-op Assessment: Report given to RN and Post -op Vital signs reviewed and unstable, Anesthesiologist notified; anesthesiologist aware ? ?Post vital signs: Reviewed ? ?Last Vitals:  ?Vitals Value Taken Time  ?BP    ?Temp    ?Pulse 105 04/06/2021 1725  ?Resp 14 04/09/2021 1725  ?SpO2 99 % 04/22/2021 1725  ?Vitals shown include unvalidated device data. ? ?Last Pain:  ?Vitals:  ? 05/03/2021 1518  ?TempSrc:   ?PainSc: 0-No pain  ?   ? ?Patients Stated Pain Goal: 0 (04/09/2021 0045) ? ?Complications: No notable events documented. ?

## 2021-04-24 NOTE — Assessment & Plan Note (Signed)
-   Consult Palliative Care 

## 2021-04-24 NOTE — Progress Notes (Signed)
? ?Progress Note ? ?Patient Name: Ann Bryan ?Date of Encounter: 04/29/2021 ? ?Primary Cardiologist:   Dorris Carnes, MD ? ? ?Subjective  ? ?Asked to see this patient .  She has coronary disease as described.  She went for tunnel catheter today and had bradycardic arrest.  She is now on pressors and intubated.  She had junctional rhythm on arrival to the ED.  She was apparently pulseless for a period of time.   ? ?Inpatient Medications  ?  ?Scheduled Meds: ? (feeding supplement) PROSource Plus  30 mL Oral BID BM  ? amiodarone  200 mg Oral BID  ? aspirin  81 mg Oral Daily  ? atorvastatin  80 mg Oral Daily  ? brimonidine  1 drop Left Eye BID  ? [START ON 04/25/2021] calcitRIOL  1 mcg Oral Once per day on Tue Thu Sat  ? colchicine  0.3 mg Oral Daily  ? feeding supplement (NEPRO CARB STEADY)  237 mL Oral BID BM  ? fentaNYL      ? heparin  5,000 Units Subcutaneous Q8H  ? insulin aspart  0-6 Units Subcutaneous Q4H  ? insulin glargine-yfgn  18 Units Subcutaneous Daily  ? loratadine  10 mg Oral Daily  ? nystatin   Topical TID  ? pantoprazole sodium  40 mg Per Tube Daily  ? sevelamer carbonate  2,400 mg Oral TID WC  ? ?Continuous Infusions: ? sodium chloride    ? norepinephrine (LEVOPHED) Adult infusion    ? ?PRN Meds: ?acetaminophen, ondansetron (ZOFRAN) IV, phenol, povidone-iodine, prochlorperazine, sevelamer carbonate  ? ?Vital Signs  ?  ?Vitals:  ? 04/10/2021 1730 04/15/2021 1745 04/21/2021 1800 04/26/2021 1815  ?BP: (!) 175/102 (!) 173/96 (!) 160/88 (!) 148/80  ?Pulse: (!) 109 90 90 94  ?Resp: (!) 21 (!) 0 18 18  ?Temp:   98.5 ?F (36.9 ?C)   ?TempSrc:      ?SpO2: 100% 100% 100% 100%  ?Weight:      ?Height:      ? ? ?Intake/Output Summary (Last 24 hours) at 04/19/2021 1849 ?Last data filed at 04/25/2021 1727 ?Gross per 24 hour  ?Intake 590 ml  ?Output 5 ml  ?Net 585 ml  ? ?Filed Weights  ? 04/25/2021 0855 04/15/2021 1200 04/29/2021 0504  ?Weight: 61.7 kg 60 kg 60.7 kg  ? ? ?Telemetry  ?  ?NSR - Personally Reviewed ? ?ECG  ?  ?NSR, rate  91, axis WNL, no acute ST T wave changes.   - Personally Reviewed ? ?Physical Exam  ? ?GEN: No acute distress.   ?Cardiac: RRR, no murmurs, rubs, or gallops.  ?Respiratory: Clear intubated ?GI: Soft, nontender, non-distended  ?MS: No  edema; No deformity. ? ? ?Labs  ?  ?Chemistry ?Recent Labs  ?Lab 05/03/2021 ?0028 04/17/2021 ?1139 04/19/21 ?1017 04/20/21 ?0103 04/22/21 ?0118 04/17/2021 ?0142 05/01/2021 ?5102  ?NA 129*   < > 128*   < > 135 136 132*  ?K 4.1   < > 4.2   < > 3.6 4.5 4.7  ?CL 95*  --  91*   < > 98 98 97*  ?CO2 15*  --  18*   < > 22 25 24   ?GLUCOSE 305*  --  347*   < > 154* 146* 175*  ?BUN 115*  --  105*   < > 80* 44* 31*  ?CREATININE 15.04*  --  14.55*   < > 13.05* 8.98* 7.06*  ?CALCIUM 6.3*  --  6.5*   < > 8.1*  8.0* 8.1*  ?PROT 4.0*  --  4.3*  --   --   --   --   ?ALBUMIN <1.5*  --  <1.5*   < > <1.5* <1.5* <1.5*  ?AST 16  --  13*  --   --   --   --   ?ALT 17  --  15  --   --   --   --   ?ALKPHOS 192*  --  170*  --   --   --   --   ?BILITOT 0.9  --  0.6  --   --   --   --   ?GFRNONAA 3*  --  3*   < > 3* 5* 6*  ?ANIONGAP 19*  --  19*   < > 15 13 11   ? < > = values in this interval not displayed.  ?  ? ?Hematology ?Recent Labs  ?Lab 04/22/21 ?0118 04/29/2021 ?0142 05/05/2021 ?0128  ?WBC 12.9* 14.9* 17.0*  ?RBC 4.26 4.43  4.48 4.14  ?HGB 12.0 12.4 11.4*  ?HCT 37.0 39.0 36.5  ?MCV 86.9 88.0 88.2  ?MCH 28.2 28.0 27.5  ?MCHC 32.4 31.8 31.2  ?RDW 13.8 14.1 13.8  ?PLT 421* 413* 401*  ? ? ?Cardiac EnzymesNo results for input(s): TROPONINI in the last 168 hours. No results for input(s): TROPIPOC in the last 168 hours.  ? ?BNPNo results for input(s): BNP, PROBNP in the last 168 hours.  ? ?DDimer No results for input(s): DDIMER in the last 168 hours.  ? ?Radiology  ?  ?PERIPHERAL VASCULAR CATHETERIZATION ? ?Result Date: 04/07/2021 ?Images from the original result were not included. Patient name: KARON COTTERILL MRN: [id number] DOB: 1960-10-23 Sex: female 05/04/2021 Pre-operative Diagnosis: ESRD Post-operative diagnosis:  Same  Surgeon:  [name] Procedure Performed:  1.  Ultrasound-guided access, left cephalic vein  2.  Shuntogram  3.  Ultrasound-guided access, left brachial vein  4.  Central venogram  5.  Conscious sedation, 16 minutes  Indications: This is a 61 year old female with end-stage renal disease.  She is transitioning from peritoneal dialysis to hemodialysis.  She has a left brachiocephalic fistula which is pulsatile.  She comes in today for further evaluation. Procedure:  The patient was identified in the holding area and taken to room 8.  The patient was then placed supine on the table and prepped and draped in the usual sterile fashion.  A time out was called.  Conscious sedation was administered with the use of IV fentanyl and Versed under continuous physician and nurse monitoring.  Heart rate, blood pressure, and oxygen saturations were continuously monitored.  Total sedation time was 16 minutes ultrasound was used to evaluate the fistula.  The vein was patent and compressible.  A digital ultrasound image was acquired.  The fistula was then accessed under ultrasound guidance using a micropuncture needle.  An 018 wire was then asvanced without resistance and a micropuncture sheath was placed.  Contrast injections were then performed through the sheath. Findings: The cephalic vein fistula appears to be patent without stenosis in the upper arm.  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  I did not see any reconstitution of the central veins once the fistula occluded.  Next, I elected to cannulate the brachial vein.  This was done under ultrasound guidance with a micropuncture needle.  A micropuncture sheath was placed over a [place] wire and a left arm venogram was performed which showed a patent central venous system with small appearing brachial veins.  Intervention:  none Impression:  #1  Left cephalic vein fistula is occluded within the chest.  I did not see any central vein reconstitution from the fistulogram and so I  cannulated the brachial vein and performed a venogram which did show a patent subclavian and innominate vein draining back into the heart.  The brachial veins did appear small in the arm.  I think the next step is to proceed with a cephalic vein turndown. Theotis Burrow, M.D., [facility] Office: [phone] Pager:  [phone]  ? ?DG Chest Port 1 View ? ?Result Date: 04/17/2021 ?CLINICAL DATA:  Endotracheal tube inserted, check position EXAM: PORTABLE CHEST 1 VIEW COMPARISON:  04/15/2021 FINDINGS: Interval placement of an endotracheal tube, with tip approximately 2.5 cm of the carina. Interval placement of a right CVC, with tip approximating the right atrium. Unchanged cardiac and mediastinal contours. Aortic atherosclerosis. Improved lung aeration. No new focal pulmonary opacity. No pleural effusion or pneumothorax. No acute osseous abnormality. IMPRESSION: 1. Support apparatus in appropriate position. 2. Improved lung aeration. Electronically Signed   By: Merilyn Baba M.D.   On: 04/18/2021 18:04   ? ?Cardiac Studies  ? ?Diagnostic ?Dominance: Right ? ? ?1. Left ventricular ejection fraction by 3D volume is 21 %. The left  ?ventricle has severely decreased function. The left ventricle demonstrates  ?global hypokinesis. Left ventricular diastolic parameters are consistent  ?with Grade I diastolic dysfunction  ?(impaired relaxation). Elevated left ventricular end-diastolic pressure.  ? 2. Right ventricular systolic function is moderately reduced. The right  ?ventricular size is mildly enlarged. There is normal pulmonary artery  ?systolic pressure.  ? 3. Left atrial size was mildly dilated.  ? 4. There is small to moderate circumferential pericardial effusion. There  ?is no evidence of cardiac tamponade.  ? 5. The mitral valve is normal in structure. Mild mitral valve  ?regurgitation. No evidence of mitral stenosis.  ? 6. The aortic valve is normal in structure. Aortic valve  regurgitation is  ?not visualized. No aortic stenosis is present.  ? 7. The inferior vena cava is normal in size with greater than 50%  ?respiratory variability, suggesting right atrial pressure of 3 mmHg.  ? ?Fraser Din

## 2021-04-24 NOTE — Progress Notes (Signed)
PT Cancellation Note ? ?Patient Details ?Name: Ann Bryan ?MRN: [id number] ?DOB: 1960/11/22 ? ? ?Cancelled Treatment:    Reason Eval/Treat Not Completed: Patient at procedure or test/unavailable. Will plan to follow-up later as time permits. ? ? ?Moishe Spice, PT, DPT ?[facility]  ?Pager: [phone] ?Office: [phone] ? ? ? ?[name] ?04/14/2021, 3:03 PM ? ? ?

## 2021-04-24 NOTE — Progress Notes (Signed)
OT Cancellation Note ? ?Patient Details ?Name: Ann Bryan ?MRN: [id number] ?DOB: 01-Nov-1960 ? ? ?Cancelled Treatment:    Reason Eval/Treat Not Completed: Patient at procedure or test/ unavailable;Other (comment) (pt in OR for fistula revision) will check back as time allows for OT session.  ? ?Corinne Ports K., COTA/L ?[facility] ?[phone] ? ? ?Precious Haws ?04/10/2021, 3:04 PM ?

## 2021-04-24 NOTE — Assessment & Plan Note (Addendum)
Brief episodes of SVT with associated hypotension int he cath lab noted.  Started on amiodarone at that time, hopefully this will be for short term use, stopped at office follow up. ?-Continue Coreg, amiodarone ?

## 2021-04-24 NOTE — H&P (Addendum)
? ?NAME:  Ann Bryan, MRN:  [id number], DOB:  08-Nov-1960, LOS: 8 ?ADMISSION DATE:  04/15/2021, CONSULTATION DATE:  4/19 ?REFERRING MD:  Dr. Trula Slade, CHIEF COMPLAINT:  bradycardia; intubated for   ? ?History of Present Illness:  ?This is a 61 year old female with past medical history of DM, HTN, HLD, neuropathy, ESRD on PD.  She presented to [facility] ED 04/09/2021 with generalized weakness and syncope. ?  ?In ED, she was found to have NSTEMI.  Her syncopal episode was felt to be due to hypoglycemia after her insulin regimen was adjusted recently before this episode. ?  ?She was admitted by [facility].  She was seen in consultation by cardiology after she was found to have new systolic CHF and possible cardiogenic shock in the setting of NSTEMI.  Right left heart cath showed severe three-vessel CAD for which PCI was not felt to be a great option nor was mechanical support.  It was recommended to manage NSTEMI medically though very little room for GDMT. ?  ?Palliative care was consulted 4/15 due to her multiple comorbidities and limited options.  After discussions, patient requested to be DNR. ?  ?IR consulted 4/16 for tunneled HD cath placement as it was felt that she was not tolerating PD well at home.  4/17, vascular surgery consulted for evaluation of new access either AVF versus AVG for long-term HD access. ?  ?4/19, she was taken to the operating room for new AVF placement.  Either peri or postop, she developed bradycardia and hypotension.  She was subsequently intubated due to concerns for near arrest.  Started on levo/neo for hypotension. No compressions. Unknown if patient lost pulses. PCCM was called in consultation and for assistance in the ICU. ? ?Pertinent  Medical History  ? ?Past Medical History:  ?Diagnosis Date  ? Anemia   ? Cataract   ? right eye  ? Complication of anesthesia   ?  " I woke up itching from anesthesia many years ago."   ? Depression   ? at times  ? Diabetes mellitus 1986  ?  poorly controlled last  hemoglobin A1c  10.5 on January 01, 2010, increase since June 2011 when hemoglobin A1c was 9.3 , determined to be most likely secondary to non-compliance.  ? Diabetic nephropathy ([place])   ? Diabetic retinopathy   ? Dyspnea   ? "when I have too much fluid"  ? End stage renal disease on dialysis [facility])   ? T/Th/Sat [address]  ? Glaucoma   ? History of blood transfusion   ? Hyperlipidemia   ? Hypertension   ? Nausea and vomiting   ? Neuropathy   ? Retinopathy   ? Wears glasses   ? ? ? ?Significant Hospital Events: ?Including procedures, antibiotic start and stop dates in addition to other pertinent events   ?4/11: admitted w/ syncope ?4/19: went to OR to obtain fistula graft revision; during procedure patient developed severe bradycardia 30-40s, became unresponsive, hypotensive started on levo/neo, unknown if lost pulses. Patient intubated. No compressions with patient being DNR. Admitted to ICU ? ?Interim History / Subjective:  ?Patient intubated on mech vent ?Patient HR 110s; nurse states was brady initially with a complete heart block rhythm.  ?SBP 160-170s on neo and levo ?Patient neurologically not awake or following commands; received paralytic over an hour ago; non responsive to painful stimuli; no cough/gag ? ?Objective   ?Blood pressure 113/73, pulse 74, temperature 98 ?F (36.7 ?C), resp. rate 16, height 5' (1.524 m), weight 60.7  kg, last menstrual period 03/13/2002, SpO2 96 %. ?   ?   ? ?Intake/Output Summary (Last 24 hours) at 04/11/2021 1704 ?Last data filed at 04/19/2021 1654 ?Gross per 24 hour  ?Intake 540 ml  ?Output 0 ml  ?Net 540 ml  ? ?Filed Weights  ? 04/07/2021 0855 04/30/2021 1200 04/07/2021 0504  ?Weight: 61.7 kg 60 kg 60.7 kg  ? ? ?Examination: ?General:  critically ill appearing on mech vent ?HEENT: MM pink/moist; ETT in place; L pupil appears to have had possible cataract surgery ?Neuro: not awake or following commands; received paralytic over an hour ago; non responsive to painful stimuli; no  cough/gag; R pupil sluggish 3 mm; L pupil appears to have had possible cataract surgery ?CV: s1s2, rate 110s, no m/r/g ?PULM:  dim clear BS bilaterally; ; on mech vent PRVC ?GI: soft, bsx4 active  ?Extremities: warm/dry, BLE edema  ?Skin: no rashes or lesions appreciated ? ? ?Resolved Hospital Problem list   ? ? ?Assessment & Plan:  ? ?Bradycardia w/ syncope: post procedure on 4/19 during fistula graft revision ?Possible PEA arrest: Patient became bradycardic and non responsive toward the end of graft revision procedure 4/19. Patient intubated and started on pressors for hypotension. No compressions started since patient is DNR.  ?Shock ?P: ?-will admit to ICU with contuous telemetry ?-Bradycardia has converted into rate 110s ?-check EKG ?-will call Cardiology ?-check CBG ?-wean levo/neo for MAP goal >65 ?-trend troponin and lactate ?-check ABG ?-check BMP, Mag, ionized calcium: replete electrolytes as needed ?-CXR ? ?Acute respiratory failure due to above ?P: ?-LTVV strategy with tidal volumes of 6-8 cc/kg ideal body weight ?-check ABG and adjust settings accordingly  ?-Wean PEEP/FiO2 for SpO2 >92% ?-VAP bundle in place ?-Daily SAT and SBT when able ?-check CXR to evaluate ETT position ? ?Acute encephalopathy ?P: ?-given paralytic recently 4/19 ?-will hold sedative meds  ?-frequent neuro checks ?-consider CT head if no improvement ? ?NSTEMI ?CAD ?HLD ?P: ?-continue ASA, statin ?-hold coreg in setting of bradycardia ? ?SVT ?P: ?-telemetry monitoring ?-hold coreg ?-continue amio ? ?ESRD: patient was on PD outpatient; AV fistula graft revised 4/19 for HD ?P: ?-AV fistula graft revised 4/19 ?-nephro following ?-continue HD ?-Trend BMP / urinary output ?-Replace electrolytes as indicated ?-Avoid nephrotoxic agents, ensure adequate renal perfusion ? ?Failure to thrive ?P: ?-palliative following ?-Patient was DNR; 4/19 spoke with niece and changed to full code ? ?HTN ?Acute systolic CHF: echo EF 47-82% ?P: ?-continue  HD ?-daily weights/strict I/O's ?-hold anti-hypertensives ? ?T2DM ?P: ?-will check CBG stat ?-SSI and cbg monitoring ?-continue glargine ? ?Anemia of CKD ?P: ?-continue folate, iron, b12 ?-trend cbc ? ?Neuropathy of foot ?P: ?-hold gabapentin in setting of encephalopathy ? ?Best Practice (right click and "Reselect all SmartList Selections" daily)  ? ?Diet/type: NPO w/ meds via tube ?DVT prophylaxis: prophylactic heparin  ?GI prophylaxis: PPI ?Lines: N/A ?Foley:  N/A ?Code Status:  full code ?Last date of multidisciplinary goals of care discussion [4/19 spoke with niece Tommie Sams and updated over phone. She resides in [place], [place]. She states that she visited [place] on 4/18 and had discussion that she wanted to be full code and not DNR. She states that [place] would not want to be on ventilator long term. She would like [place] to change code status to full code. ] ? ?Labs   ?CBC: ?Recent Labs  ?Lab 04/19/21 ?9562 04/21/21 ?1308 04/22/21 ?0118 04/14/2021 ?0142 04/12/2021 ?0128  ?WBC 12.5* 11.9* 12.9* 14.9* 17.0*  ?HGB 13.6 11.8*  12.0 12.4 11.4*  ?HCT 40.3 36.2 37.0 39.0 36.5  ?MCV 83.8 85.2 86.9 88.0 88.2  ?PLT 304 413* 421* 413* 401*  ? ? ?Basic Metabolic Panel: ?Recent Labs  ?Lab 04/20/21 ?0103 04/21/21 ?4166 04/22/21 ?0118 04/08/2021 ?0142 04/10/2021 ?0630  ?NA 132* 132* 135 136 132*  ?K 3.9 3.8 3.6 4.5 4.7  ?CL 96* 96* 98 98 97*  ?CO2 20* 21* 22 25 24   ?GLUCOSE 391* 386* 154* 146* 175*  ?BUN 92* 80* 80* 44* 31*  ?CREATININE 13.52* 12.76* 13.05* 8.98* 7.06*  ?CALCIUM 6.9* 7.3* 8.1* 8.0* 8.1*  ?MG  --   --   --  1.7  --   ?PHOS 8.7* 7.5* 6.9* 5.3* 5.8*  ? ?GFR: ?Estimated Creatinine Clearance: 7 mL/min (A) (by C-G formula based on SCr of 7.06 mg/dL (H)). ?Recent Labs  ?Lab 04/19/21 ?1601 04/21/21 ?0932 04/22/21 ?0118 04/29/2021 ?0142 04/17/2021 ?0128  ?WBC 12.5* 11.9* 12.9* 14.9* 17.0*  ?LATICACIDVEN 2.2*  --   --   --   --   ? ? ?Liver Function Tests: ?Recent Labs  ?Lab 04/09/2021 ?0028 04/19/21 ?3557 04/20/21 ?0103 04/21/21 ?3220  04/22/21 ?0118 04/14/2021 ?0142 04/14/2021 ?2542  ?AST 16 13*  --   --   --   --   --   ?ALT 17 15  --   --   --   --   --   ?ALKPHOS 192* 170*  --   --   --   --   --   ?BILITOT 0.9 0.6  --   --   --   --   --   ?PROT 4.0

## 2021-04-24 NOTE — Progress Notes (Addendum)
eLink Physician-Brief Progress Note ?Patient Name: Ann Bryan ?DOB: Jan 17, 1960 ?MRN: [id number] ? ? ?Date of Service ? 04/12/2021  ?HPI/Events of Note ? ABG result reviewed. Ca++ 1.12, Lactic acid 4.6.  ?eICU Interventions ? I agree with weaning FIO2 based on gas. One gram of Calcium gluconate ordered for Ca++ of 1.12. Lactic acid likely  cardiogenic, will trend. PAD pain protocol, OG tube, and KUB for tube placement all ordered.  ? ? ? ?  ? ?Frederik Pear ?04/25/2021, 8:49 PM ?

## 2021-04-24 NOTE — Plan of Care (Signed)
?  Interdisciplinary Goals of Care Family Meeting ? ? ?Date carried out:: 04/19/2021 ? ?Location of the meeting: Phone conference ? ?Member's involved: Family Member or next of kin and Other: PA-C ? ?Durable Power of Attorney or acting medical decision maker: Ann Bryan Combo (niece)  ? ?Discussion: We discussed goals of care for [name] .  4/19 spoke with niece Ann Bryan and updated over phone. She resides in [place], [place]. Discussed with [name] on her aunt's current status and how she was intubated and on pressors for hypotension. She states that she visited [place] on 4/18 and had discussion that she wanted to be full code and not DNR. She states that [name] would not want to be on ventilator long term. She would like [place] to change code status to full code for now and if [name]'s heart were to stop to perform CPR. ? ?Code status: Full Code ? ?Disposition: Continue current acute care ? ? ?Time spent for the meeting: 30 minutes ? ?[name] ?04/17/2021, 6:52 PM ? ?

## 2021-04-24 NOTE — Assessment & Plan Note (Signed)
Glucoses good ?-Continue glargine ?-Continue SS corrections ?

## 2021-04-24 NOTE — Hospital Course (Signed)
Ann Bryan is a 61 y.o. F with ESRD on PD prior to admission, DM with neuropathy, HTN, and HLD who presented with several days severe generalized weakness then syncopal episode, passing out on the floor.  After passing out, she was too weak to get up and spent the night on the floor.  A neighbor called EMS who arrived to find the patient on the floor.  ? ?In the ER she was hypothermic. ?

## 2021-04-24 NOTE — Progress Notes (Signed)
I was present for transport with the patient from the operating room to the ICU.  She is now intubated following cephalic vein turndown.  She became bradycardic and hypotensive during the operative procedure.  She was intubated during the procedure which was performed under regional anesthesia.  I consulted critical care and discussed the case via secure chat with the hospital service and nephrology.  I also spoke with the patient's relative who is her primary contact.  She is a DNR however, this was suspended during the operative procedure.  We will continue with supportive care at this time. ? ?[name] ?

## 2021-04-24 NOTE — Assessment & Plan Note (Signed)
Continue statin. 

## 2021-04-24 NOTE — Interval H&P Note (Signed)
History and Physical Interval Note: ? ?04/13/2021 ?3:05 PM ? ?Ann Bryan  has presented today for surgery, with the diagnosis of ESRD.  The various methods of treatment have been discussed with the patient and family. After consideration of risks, benefits and other options for treatment, the patient has consented to  Procedure(s): ?CEPHALIC  VEIN  TURNDOWN (Left) as a surgical intervention.  The patient's history has been reviewed, patient examined, no change in status, stable for surgery.  I have reviewed the patient's chart and labs.  Questions were answered to the patient's satisfaction.   ? ? ?Wells Ritesh Opara ? ? ?

## 2021-04-24 NOTE — Op Note (Signed)
? ? ?  Patient name: Ann Bryan MRN: [id number] DOB: 1960/06/10 Sex: female ? ?04/13/2021 ?Pre-operative Diagnosis: ESRD ?Post-operative diagnosis:  Same ?Surgeon:  [name] ?Assistants: Aldona Bar Ryne ?Procedure:   #1: Revision of left cephalic vein fistula via turndown to the left brachial vein ?  #2: Thrombectomy of left cephalic vein fistula ?Anesthesia: Regional ?Blood Loss: Minimal ? ?Findings: I performed a end to end anastomosis between the proximal brachial vein and the cephalic vein fistula.  During the procedure, the patient became hypotensive and bradycardic.  She was ultimately intubated towards the conclusion of the procedure and was taken to the ICU in critical condition ? ?Indications: This is a 61 year old female with end-stage renal disease.  Yesterday I performed a fistulogram which showed occlusion of her cephalic vein fistula in the upper arm.  She did have a patent brachial vein and central venous system.  I thought she was a good candidate for cephalic vein turndown to preserve her fistula. ? ?Procedure:  The patient was identified in the holding area and taken to [place] [address]  The patient was then placed supine on the table. regional anesthesia was administered.  The patient was prepped and draped in the usual sterile fashion.  A time out was called and antibiotics were administered.  A PA was necessary to expedite the procedure and assist with technical details. ? ?I made a oblique type incision beginning at the hairline extending up towards the cephalic vein fistula.  With the assistance of the PA providing suction and retraction, I exposed the brachial vein which was a healthy appearing 5 mm vein.  Through the same incision, I exposed the cephalic vein fistula.  There was a clear point in the fistula or became very hardened, consistent with thrombus.  I elected to divide the fistula at this point.  I oversewed the proximal portion with 6-0 Prolene in 2 layers.  I resected approximately  1 additional centimeter of the fistula.  At this point, I performed thrombectomy of the cephalic vein fistula removing chronic thrombus.  After this there was excellent inflow.  I fully mobilized the brachial vein at this point and ligated it distally.  I then performed a end to end anastomosis between the cephalic vein fistula and the brachial vein.  This was performed with 6-0 Prolene.  During this portion of the procedure, the patient became bradycardic and hypotensive for no obvious reason.  She was treated medically.  Ultimately she was intubated.  While anesthesia was working on airway, I completed the cephalic vein turndown and closed the incision in 2 layers.  There was a Doppler signal in the fistula.  Dermabond was placed on the incision and the patient was taken in critical condition to the ICU intubated. ? ? ?Disposition: To ICU in critical condition ? ? ?V. [name], M.D., FACS ?[facility] ?Office: [phone] ?Pager:  [phone]  ?

## 2021-04-24 NOTE — Anesthesia Preprocedure Evaluation (Addendum)
Anesthesia Evaluation  ?Patient identified by MRN, date of birth, ID band ?Patient awake ? ? ? ?Reviewed: ?Allergy & Precautions, H&P , NPO status , Patient's Chart, lab work & pertinent test results ? ?Airway ?Mallampati: II ? ? ?Neck ROM: full ? ? ? Dental ?  ?Pulmonary ?shortness of breath,  ?  ?breath sounds clear to auscultation ? ? ? ? ? ? Cardiovascular ?hypertension, + CAD and +CHF  ? ?Rhythm:regular Rate:Normal ? ?Multivessel CAD on cath.  Medical management. ?  ?Neuro/Psych ?PSYCHIATRIC DISORDERS Depression  Neuromuscular disease   ? GI/Hepatic ?  ?Endo/Other  ?diabetes ? Renal/GU ?ESRF and DialysisRenal disease  ? ?  ?Musculoskeletal ? ? Abdominal ?  ?Peds ? Hematology ?  ?Anesthesia Other Findings ? ? Reproductive/Obstetrics ? ?  ? ? ? ? ? ? ? ? ? ? ? ? ? ?  ?  ? ? ? ? ? ? ? ?Anesthesia Physical ?Anesthesia Plan ? ?ASA: 3 ? ?Anesthesia Plan: MAC and Regional  ? ?Post-op Pain Management:   ? ?Induction: Intravenous ? ?PONV Risk Score and Plan: 2 and Ondansetron, Treatment may vary due to age or medical condition and Propofol infusion ? ?Airway Management Planned: Simple Face Mask ? ?Additional Equipment:  ? ?Intra-op Plan:  ? ?Post-operative Plan:  ? ?Informed Consent: I have reviewed the patients History and Physical, chart, labs and discussed the procedure including the risks, benefits and alternatives for the proposed anesthesia with the patient or authorized representative who has indicated his/her understanding and acceptance.  ? ? ? ?Dental advisory given ? ?Plan Discussed with: CRNA, Anesthesiologist and Surgeon ? ?Anesthesia Plan Comments:   ? ? ? ? ? ?Anesthesia Quick Evaluation ? ?

## 2021-04-24 NOTE — Progress Notes (Signed)
So far tolerating the higher carvedilol dose.  ?Will wait to see how she does with next HD session and surgery later today before any additional adjustments in cardiac meds. ?

## 2021-04-25 ENCOUNTER — Encounter (HOSPITAL_COMMUNITY): Payer: Self-pay | Admitting: Surgery

## 2021-04-25 ENCOUNTER — Inpatient Hospital Stay (HOSPITAL_COMMUNITY): Payer: Medicare Other

## 2021-04-25 DIAGNOSIS — E440 Moderate protein-calorie malnutrition: Secondary | ICD-10-CM | POA: Insufficient documentation

## 2021-04-25 DIAGNOSIS — Z515 Encounter for palliative care: Secondary | ICD-10-CM

## 2021-04-25 DIAGNOSIS — Z992 Dependence on renal dialysis: Secondary | ICD-10-CM | POA: Diagnosis not present

## 2021-04-25 DIAGNOSIS — I469 Cardiac arrest, cause unspecified: Secondary | ICD-10-CM | POA: Diagnosis not present

## 2021-04-25 DIAGNOSIS — I5021 Acute systolic (congestive) heart failure: Secondary | ICD-10-CM | POA: Diagnosis not present

## 2021-04-25 DIAGNOSIS — N186 End stage renal disease: Secondary | ICD-10-CM | POA: Diagnosis not present

## 2021-04-25 DIAGNOSIS — I25119 Atherosclerotic heart disease of native coronary artery with unspecified angina pectoris: Secondary | ICD-10-CM | POA: Diagnosis not present

## 2021-04-25 DIAGNOSIS — J9601 Acute respiratory failure with hypoxia: Secondary | ICD-10-CM | POA: Diagnosis not present

## 2021-04-25 LAB — GLUCOSE, CAPILLARY
Glucose-Capillary: 179 mg/dL — ABNORMAL HIGH (ref 70–99)
Glucose-Capillary: 227 mg/dL — ABNORMAL HIGH (ref 70–99)
Glucose-Capillary: 73 mg/dL (ref 70–99)
Glucose-Capillary: 73 mg/dL (ref 70–99)
Glucose-Capillary: 80 mg/dL (ref 70–99)
Glucose-Capillary: 97 mg/dL (ref 70–99)

## 2021-04-25 LAB — CBC
HCT: 39.3 % (ref 36.0–46.0)
Hemoglobin: 12.5 g/dL (ref 12.0–15.0)
MCH: 28.3 pg (ref 26.0–34.0)
MCHC: 31.8 g/dL (ref 30.0–36.0)
MCV: 89.1 fL (ref 80.0–100.0)
Platelets: 457 10*3/uL — ABNORMAL HIGH (ref 150–400)
RBC: 4.41 MIL/uL (ref 3.87–5.11)
RDW: 13.8 % (ref 11.5–15.5)
WBC: 18.4 10*3/uL — ABNORMAL HIGH (ref 4.0–10.5)
nRBC: 0 % (ref 0.0–0.2)

## 2021-04-25 LAB — RENAL FUNCTION PANEL
Albumin: <1.5 g/dL — ABNORMAL LOW (ref 3.5–5.0)
Anion gap: 11 (ref 5–15)
BUN: 26 mg/dL — ABNORMAL HIGH (ref 6–20)
CO2: 26 mmol/L (ref 22–32)
Calcium: 7.1 mg/dL — ABNORMAL LOW (ref 8.9–10.3)
Chloride: 98 mmol/L (ref 98–111)
Creatinine, Ser: 5.36 mg/dL — ABNORMAL HIGH (ref 0.44–1.00)
GFR, Estimated: 9 mL/min — ABNORMAL LOW (ref >60)
Glucose, Bld: 85 mg/dL (ref 70–99)
Phosphorus: 3.9 mg/dL (ref 2.5–4.6)
Potassium: 3.6 mmol/L (ref 3.5–5.1)
Sodium: 135 mmol/L (ref 135–145)

## 2021-04-25 LAB — LACTIC ACID, PLASMA
Lactic Acid, Venous: 1.9 mmol/L (ref 0.5–1.9)
Lactic Acid, Venous: 1.8 mmol/L (ref 0.5–1.9)

## 2021-04-25 LAB — CALCIUM, IONIZED: Calcium, Ionized, Serum: 4.4 mg/dL — ABNORMAL LOW (ref 4.5–5.6)

## 2021-04-25 LAB — TRIGLYCERIDES: Triglycerides: 54 mg/dL (ref <150)

## 2021-04-25 MED ORDER — ASPIRIN 81 MG PO CHEW
81.0000 mg | CHEWABLE_TABLET | Freq: Every day | ORAL | Status: DC
  Administered 2021-04-26 – 2021-04-28 (×3): 81 mg
  Filled 2021-04-25 (×3): qty 1

## 2021-04-25 MED ORDER — LORATADINE 10 MG PO TABS
10.0000 mg | ORAL_TABLET | Freq: Every day | ORAL | Status: DC
  Administered 2021-04-26 – 2021-04-28 (×3): 10 mg
  Filled 2021-04-25 (×3): qty 1

## 2021-04-25 MED ORDER — SEVELAMER CARBONATE 800 MG PO TABS
2400.0000 mg | ORAL_TABLET | Freq: Three times a day (TID) | ORAL | Status: DC
  Administered 2021-04-26 – 2021-04-28 (×6): 2400 mg
  Filled 2021-04-25 (×6): qty 3

## 2021-04-25 MED ORDER — COLCHICINE 0.3 MG HALF TABLET
0.3000 mg | ORAL_TABLET | Freq: Every day | ORAL | Status: DC
  Administered 2021-04-25 – 2021-04-28 (×4): 0.3 mg
  Filled 2021-04-25 (×4): qty 1

## 2021-04-25 MED ORDER — NEPRO/CARBSTEADY PO LIQD: 237.0000 mL | Freq: Two times a day (BID) | ORAL | Status: DC

## 2021-04-25 MED ORDER — SEVELAMER CARBONATE 800 MG PO TABS
800.0000 mg | ORAL_TABLET | Freq: Two times a day (BID) | ORAL | Status: DC | PRN
Start: 2021-04-25 — End: 2021-04-28

## 2021-04-25 MED ORDER — PROPOFOL 1000 MG/100ML IV EMUL
INTRAVENOUS | Status: AC
  Filled 2021-04-25: qty 100

## 2021-04-25 MED ORDER — ACETAMINOPHEN 160 MG/5ML PO SOLN
650.0000 mg | ORAL | Status: DC | PRN
  Administered 2021-04-27 – 2021-04-28 (×2): 650 mg
  Filled 2021-04-25: qty 20.3

## 2021-04-25 MED ORDER — ATORVASTATIN CALCIUM 80 MG PO TABS
80.0000 mg | ORAL_TABLET | Freq: Every day | ORAL | Status: DC
  Administered 2021-04-26 – 2021-04-28 (×3): 80 mg
  Filled 2021-04-25 (×3): qty 1

## 2021-04-25 MED ORDER — CALCITRIOL 0.25 MCG PO CAPS
1.0000 ug | ORAL_CAPSULE | ORAL | Status: DC
  Filled 2021-04-25: qty 4

## 2021-04-25 MED ORDER — ALTEPLASE 2 MG IJ SOLR
2.0000 mg | Freq: Once | INTRAMUSCULAR | Status: AC
  Administered 2021-04-25: 2 mg
  Filled 2021-04-25: qty 2

## 2021-04-25 MED ORDER — RENA-VITE PO TABS
1.0000 | ORAL_TABLET | Freq: Every day | ORAL | Status: DC
Start: 2021-04-25 — End: 2021-04-28
  Administered 2021-04-25 – 2021-04-27 (×3): 1
  Filled 2021-04-25 (×3): qty 1

## 2021-04-25 MED ORDER — HEPARIN SODIUM (PORCINE) 1000 UNIT/ML DIALYSIS
1500.0000 [IU] | INTRAMUSCULAR | Status: DC | PRN
  Filled 2021-04-25: qty 2

## 2021-04-25 MED ORDER — PHENYLEPHRINE HCL-NACL 20-0.9 MG/250ML-% IV SOLN
INTRAVENOUS | Status: AC
  Filled 2021-04-25: qty 1000

## 2021-04-25 MED ORDER — LABETALOL HCL 5 MG/ML IV SOLN
5.0000 mg | INTRAVENOUS | Status: AC | PRN
  Filled 2021-04-25: qty 4

## 2021-04-25 MED ORDER — HEPARIN SODIUM (PORCINE) 1000 UNIT/ML DIALYSIS: 2000.0000 [IU] | INTRAMUSCULAR | Status: DC | PRN

## 2021-04-25 MED ORDER — HEPARIN SODIUM (PORCINE) 1000 UNIT/ML DIALYSIS
1500.0000 [IU] | INTRAMUSCULAR | Status: DC | PRN
  Administered 2021-04-27: 1500 [IU] via INTRAVENOUS_CENTRAL
  Filled 2021-04-25: qty 2

## 2021-04-25 NOTE — Evaluation (Signed)
Physical Therapy Re-Evaluation ?Patient Details ?Name: Ann Bryan ?MRN: [id number] ?DOB: February 07, 1960 ?Today's Date: 04/25/2021 ? ?History of Present Illness ? 61 y.o. female presenting to ED on 04/15/2021 with generalized weakness (L>R) and pre-syncopal episode. CT of head negative. Pt admitted with NSTEMI with elevated troponin and echo displaying severe LV and RV dysfunction. Pt also with questionable hypoglycemic episode and sepsis. S/p right/left heart cath and coronary angiography 4/13. S/p tunneled HD catheter placement 4/17. S/p L cephalic vein shuntogram and L brachial vein central venogram 4/18. S/p revision of L cephalic vein fistula via turndown to the L brachial vein and thrombectomy of L cephalic vein fistula 4/03 with pt becoming hypotensive and bradycardic during and was intubated and transferred to ICU. PMH: DM2, HTN, HLD, diabetic neuropathy, and ESRD on PD ?  ?Clinical Impression ? Pt presents with condition above and deficits mentioned below, see PT Problem List. Pt has had a significant medical and functional decline, now intubated and needing TA x2 for bed mobility and static sitting balance. Noted R-sided weakness with pt being flaccid in her R upper and lower extremity with no spontaneous movement or withdrawal to painful stimuli. Pt also leaning to her R more consistently when sitting EOB and not opening her R eye but would intermittently open her L eye. Notified RN and MD of these noted changes in one-sided strength with MD reporting plan to obtain imaging of her head. Pt limited this date by lethargy, not following commands or assisting in any functional mobility during the session. She did appear to mouth a few words initially though. Will continue to follow acutely. Recommending rehab at a SNF.   ?   ? ?Recommendations for follow up therapy are one component of a multi-disciplinary discharge planning process, led by the attending physician.  Recommendations may be updated based on patient  status, additional functional criteria and insurance authorization. ? ?Follow Up Recommendations Skilled nursing-short term rehab (<3 hours/day) ? ?  ?Assistance Recommended at Discharge Frequent or constant Supervision/Assistance  ?Patient can return home with the following ? Two people to help with walking and/or transfers;A lot of help with bathing/dressing/bathroom;Assistance with cooking/housework;Direct supervision/assist for medications management;Direct supervision/assist for financial management;Assistance with feeding;Assist for transportation;Help with stairs or ramp for entrance ? ?  ?Equipment Recommendations Other (comment) (TBA)  ?Recommendations for Other Services ?    ?  ?Functional Status Assessment Patient has had a recent decline in their functional status and demonstrates the ability to make significant improvements in function in a reasonable and predictable amount of time.  ? ?  ?Precautions / Restrictions Precautions ?Precautions: Fall ?Precaution Comments: watch HR; intubated; mittens ?Restrictions ?Weight Bearing Restrictions: No  ? ?  ? ?Mobility ? Bed Mobility ?Overal bed mobility: Needs Assistance ?Bed Mobility: Supine to Sit, Sit to Supine ?  ?  ?Supine to sit: Total assist, +2 for physical assistance ?Sit to supine: Total assist, +2 for physical assistance ?  ?General bed mobility comments: Total A for bed mobility, no activation noted by pt to assist. ?  ? ?Transfers ?  ?  ?  ?  ?  ?  ?  ?  ?  ?General transfer comment: Defer due to safety and engagement ?  ? ?Ambulation/Gait ?  ?  ?  ?  ?  ?  ?  ?General Gait Details: Defer due to safety and engagement ? ?Stairs ?  ?  ?  ?  ?  ? ?Wheelchair Mobility ?  ? ?Modified Rankin (Stroke Patients Only) ?  ? ?  ? ?  Balance Overall balance assessment: Needs assistance ?Sitting-balance support: No upper extremity supported, Feet supported ?Sitting balance-Leahy Scale: Zero ?Sitting balance - Comments: Total A for sitting balance, no attempts to  actively engage core to react to catch balance with LOB bouts in all directions. Pt tends to lose balance and lean to the R though. ?  ?  ?  ?Standing balance comment: Defer due to safety and engagement ?  ?  ?  ?  ?  ?  ?  ?  ?  ?  ?  ?   ? ? ? ?Pertinent Vitals/Pain Pain Assessment ?Pain Assessment: Faces ?Faces Pain Scale: Hurts a little bit ?Pain Location: grimacing with movement ?Pain Descriptors / Indicators: Grimacing ?Pain Intervention(s): Monitored during session, Limited activity within patient's tolerance, Repositioned  ? ? ?Home Living Family/patient expects to be discharged to:: Private residence ?Living Arrangements: Alone ?Available Help at Discharge: Family;Available PRN/intermittently (niece works and lives in [place]) ?Type of Home: Apartment ?Home Access: Stairs to enter ?Entrance Stairs-Rails: None;Right (none on 3 initial steps, R rail ascending on 3 flights to access apartment) ?Entrance Stairs-Number of Steps: 3 + 3 flights ?  ?Home Layout: One level ?Home Equipment: None ?   ?  ?Prior Function Prior Level of Function : Independent/Modified Independent ?  ?  ?  ?  ?  ?  ?Mobility Comments: Holds onto furniture for support. Denies any falls in past 6 months, but did slide onto ground recently when got nauseated. Has difficulty navigating stairs, she has to sit after each set to rest. Sometimes she crawls up/down stairs. ?ADLs Comments: Does not drive, takes Lyft or public transportation. Has had difficulty stepping over tub for showers. Sometimes rides electric carts to grocery shop. ?  ? ? ?Hand Dominance  ? Dominant Hand: Right ? ?  ?Extremity/Trunk Assessment  ? Upper Extremity Assessment ?Upper Extremity Assessment: Defer to OT evaluation ?RUE Deficits / Details: No active movement or withdrawls to pain ?RUE Coordination: decreased gross motor;decreased fine motor ?LUE Deficits / Details: Moving LUE against gravity. resisenting PROM. ?LUE Coordination: decreased fine motor ?  ? ?Lower  Extremity Assessment ?Lower Extremity Assessment: Generalized weakness;RLE deficits/detail;LLE deficits/detail ?RLE Deficits / Details: No active movement or withdrawl to pain ?RLE Sensation: decreased light touch ?LLE Deficits / Details: Sponatenous movement against gravity in bed, but does not follow cues to formally assess ?LLE Sensation: history of peripheral neuropathy ?  ? ?Cervical / Trunk Assessment ?Cervical / Trunk Assessment: Kyphotic  ?Communication  ? Communication: Other (comment) (intubated)  ?Cognition Arousal/Alertness: Lethargic ?Behavior During Therapy: Flat affect ?Overall Cognitive Status: Difficult to assess ?  ?  ?  ?  ?  ?  ?  ?  ?  ?  ?  ?  ?  ?  ?  ?  ?General Comments: Eyes closed for majority of session. Opening eyes once or twice to name, only opening L not R eye though. Not following commands. Appeared to mouth "stop it" at one point though. ?  ?  ? ?  ?General Comments General comments (skin integrity, edema, etc.): VSS on vent ? ?  ?Exercises    ? ?Assessment/Plan  ?  ?PT Assessment Patient needs continued PT services  ?PT Problem List Decreased strength;Decreased activity tolerance;Decreased balance;Decreased mobility;Decreased knowledge of use of DME;Cardiopulmonary status limiting activity;Impaired sensation;Decreased cognition;Decreased coordination ? ?   ?  ?PT Treatment Interventions DME instruction;Gait training;Stair training;Functional mobility training;Therapeutic activities;Therapeutic exercise;Balance training;Neuromuscular re-education;Patient/family education;Cognitive remediation   ? ?PT Goals (Current goals can  be found in the Care Plan section)  ?Acute Rehab PT Goals ?Patient Stated Goal: did not state ?PT Goal Formulation: Patient unable to participate in goal setting ?Time For Goal Achievement: 05/09/21 ?Potential to Achieve Goals: Fair ? ?  ?Frequency Min 3X/week ?  ? ? ?Co-evaluation PT/OT/SLP Co-Evaluation/Treatment: Yes ?Reason for Co-Treatment: Necessary to  address cognition/behavior during functional activity;For patient/therapist safety;To address functional/ADL transfers;Complexity of the patient's impairments (multi-system involvement) ?PT goals addressed dur

## 2021-04-25 NOTE — Progress Notes (Signed)
? ? ?Subjective  - POD #1, status post left cephalic vein turndown ? ?Patient has remained intubated overnight.  She is now off of her pressors. ? ? ?Physical Exam: ? ?Palpable thrill within left cephalic vein fistula ?Intubated and sedated.  No purposeful movements. ? ? ? ? ? ? ?Assessment/Plan:  POD #1 ? ?Continue with supportive care with hopes of extubation in the near future.  Appreciate assistance from CCM. ? ?Can attempt to use fistula at dialysis. ? ?Ann Bryan ?04/25/2021 ?7:47 AM ?-- ? ?Vitals:  ? 04/25/21 0600 04/25/21 0735  ?BP: 105/67   ?Pulse: 81 78  ?Resp: 19 16  ?Temp:  99.2 ?F (37.3 ?C)  ?SpO2: 97% 99%  ? ? ?Intake/Output Summary (Last 24 hours) at 04/25/2021 0747 ?Last data filed at 04/25/2021 0500 ?Gross per 24 hour  ?Intake 488.19 ml  ?Output 305 ml  ?Net 183.19 ml  ? ? ? ?Laboratory ?CBC ?   ?Component Value Date/Time  ? WBC 17.0 (H) 04/20/2021 0128  ? HGB 12.2 05/02/2021 1945  ? HGB 12.1 01/16/2021 1018  ? HCT 36.0 04/19/2021 1945  ? HCT 38.1 01/16/2021 1018  ? PLT 401 (H) 04/17/2021 0128  ? PLT 303 01/16/2021 1018  ? ? ?BMET ?   ?Component Value Date/Time  ? NA 130 (L) 05/04/2021 1945  ? NA 143 11/02/2017 1651  ? K 4.9 05/02/2021 1945  ? CL 101 04/09/2021 1814  ? CO2 15 (L) 04/21/2021 1814  ? GLUCOSE 163 (H) 04/27/2021 1814  ? BUN 36 (H) 04/07/2021 1814  ? BUN 64 (H) 11/02/2017 1651  ? CREATININE 7.90 (H) 04/13/2021 1814  ? CREATININE 1.69 (H) 12/10/2015 1145  ? CALCIUM 8.3 (L) 04/09/2021 1814  ? GFRNONAA 5 (L) 04/10/2021 1814  ? GFRNONAA 34 (L) 12/10/2015 1145  ? GFRAA 5 (L) 05/20/2019 1816  ? GFRAA 39 (L) 12/10/2015 1145  ? ? ?COAG ?Lab Results  ?Component Value Date  ? INR 1.1 11/15/2018  ? ?No results found for: PTT ? ?Antibiotics ?Anti-infectives (From admission, onward)  ? ? Start     Dose/Rate Route Frequency Ordered Stop  ? 04/15/2021 1515  ceFAZolin (ANCEF) IVPB 2g/100 mL premix       ? 2 g ?200 mL/hr over 30 Minutes Intravenous  Once 04/27/2021 1510 04/08/2021 1554  ? 04/22/21 1125   ceFAZolin (ANCEF) 2-4 GM/100ML-% IVPB       ?Note to Pharmacy: Kandy Garrison J: cabinet override  ?    04/22/21 1125 04/22/21 2329  ? 04/22/21 0000  ceFAZolin (ANCEF) IVPB 2g/100 mL premix       ? 2 g ?200 mL/hr over 30 Minutes Intravenous To Radiology 04/21/21 1409 04/22/21 1156  ? 04/17/21 1400  ceFEPIme (MAXIPIME) 1 g in sodium chloride 0.9 % 100 mL IVPB  Status:  Discontinued       ? 1 g ?200 mL/hr over 30 Minutes Intravenous Every 24 hours 04/27/2021 1404 04/17/21 0843  ? 04/12/2021 1400  ceFEPIme (MAXIPIME) 2 g in sodium chloride 0.9 % 100 mL IVPB       ? 2 g ?200 mL/hr over 30 Minutes Intravenous  Once 04/20/2021 1352 04/17/2021 1530  ? 04/22/2021 1400  metroNIDAZOLE (FLAGYL) IVPB 500 mg       ? 500 mg ?100 mL/hr over 60 Minutes Intravenous  Once 05/01/2021 1352 05/05/2021 1533  ? 04/17/2021 1400  vancomycin (VANCOCIN) IVPB 1000 mg/200 mL premix       ? 1,000 mg ?200 mL/hr over 60 Minutes Intravenous  Once 04/11/2021 1352 05/04/2021 1947  ? ?  ? ? ? ?V. Leia Alf, M.D., FACS ?[facility] ?Office: [phone] ?Pager:  [phone]  ?

## 2021-04-25 NOTE — Progress Notes (Addendum)
? ?Progress Note ? ?Patient Name: Ann Bryan ?Date of Encounter: 04/25/2021 ? ?[facility] Cardiologist: Dorris Carnes, MD  ? ?Subjective  ? ?Unresponsive, now off propofol for almost an hour. Not breathing over vent. ?No further bradycardia. Normal BP off pressors. O2 sat 100% on mechanical support. ? ?Inpatient Medications  ?  ?Scheduled Meds: ? (feeding supplement) PROSource Plus  30 mL Oral BID BM  ? acetaminophen (TYLENOL) oral liquid 160 mg/5 mL  325 mg Per Tube Q6H  ? amiodarone  200 mg Per Tube BID  ? aspirin  81 mg Oral Daily  ? atorvastatin  80 mg Oral Daily  ? brimonidine  1 drop Left Eye BID  ? calcitRIOL  1 mcg Oral Once per day on Tue Thu Sat  ? colchicine  0.3 mg Oral Daily  ? docusate  100 mg Per Tube BID  ? feeding supplement (NEPRO CARB STEADY)  237 mL Oral BID BM  ? heparin  5,000 Units Subcutaneous Q8H  ? insulin aspart  0-6 Units Subcutaneous Q4H  ? insulin glargine-yfgn  18 Units Subcutaneous Daily  ? loratadine  10 mg Oral Daily  ? mouth rinse  15 mL Mouth Rinse 10 times per day  ? nystatin   Topical TID  ? pantoprazole sodium  40 mg Per Tube Daily  ? polyethylene glycol  17 g Per Tube Daily  ? sevelamer carbonate  2,400 mg Oral TID WC  ? ?Continuous Infusions: ? sodium chloride    ? norepinephrine (LEVOPHED) Adult infusion Stopped (05/02/2021 2145)  ? propofol (DIPRIVAN) infusion 15 mcg/kg/min (04/25/21 0500)  ? ?PRN Meds: ?acetaminophen, fentaNYL (SUBLIMAZE) injection, fentaNYL (SUBLIMAZE) injection, labetalol, ondansetron (ZOFRAN) IV, phenol, povidone-iodine, prochlorperazine, sevelamer carbonate  ? ?Vital Signs  ?  ?Vitals:  ? 04/25/21 0500 04/25/21 0530 04/25/21 0600 04/25/21 0735  ?BP: 109/69 110/69 105/67   ?Pulse:  81 81 78  ?Resp: 19 17 19 16   ?Temp:    99.2 ?F (37.3 ?C)  ?TempSrc:      ?SpO2:  98% 97% 99%  ?Weight:      ?Height:      ? ? ?Intake/Output Summary (Last 24 hours) at 04/25/2021 0826 ?Last data filed at 04/25/2021 0500 ?Gross per 24 hour  ?Intake 488.19 ml  ?Output 305 ml   ?Net 183.19 ml  ? ? ?  04/11/2021  ?  5:04 AM 04/15/2021  ? 12:00 PM 04/12/2021  ?  8:55 AM  ?Last 3 Weights  ?Weight (lbs) 133 lb 13.1 oz 132 lb 4.4 oz 136 lb 0.4 oz  ?Weight (kg) 60.7 kg 60 kg 61.7 kg  ?   ? ?Telemetry  ?  ?NSR - Personally Reviewed ? ?ECG  ?  ?Sr w PVC, old inferior MI, no acute repol changes - Personally Reviewed ? ?Physical Exam  ?Unresponsive, on ventilator ?GEN: No acute distress.   ?Neck: + JVD ?Cardiac: RRR, no murmurs, rubs, or gallops.  ?Respiratory: Clear to auscultation bilaterally. ?GI: Soft, nontender, non-distended  ?MS: No edema; No deformity. ?Neuro:  Nonfocal  ?Psych: Normal affect  ? ?Labs  ?  ?High Sensitivity Troponin:   ?Recent Labs  ?Lab 04/17/21 ?0352 04/17/21 ?0500 04/20/21 ?0921 05/04/2021 ?1814 04/06/2021 ?1950  ?TROPONINIHS 4,081* 2,204* 4,481* 1,766* 2,284*  ?   ?Chemistry ?Recent Labs  ?Lab 04/19/21 ?8563 04/20/21 ?0103 04/22/21 ?0118 04/08/2021 ?0142 04/15/2021 ?1497 04/15/2021 ?1814 04/07/2021 ?1945  ?NA 128*   < > 135 136 132* 134* 130*  ?K 4.2   < > 3.6 4.5 4.7  5.0 4.9  ?CL 91*   < > 98 98 97* 101  --   ?CO2 18*   < > 22 25 24  15*  --   ?GLUCOSE 347*   < > 154* 146* 175* 163*  --   ?BUN 105*   < > 80* 44* 31* 36*  --   ?CREATININE 14.55*   < > 13.05* 8.98* 7.06* 7.90*  --   ?CALCIUM 6.5*   < > 8.1* 8.0* 8.1* 8.3*  --   ?MG  --   --   --  1.7  --  2.0  --   ?PROT 4.3*  --   --   --   --   --   --   ?ALBUMIN <1.5*   < > <1.5* <1.5* <1.5*  --   --   ?AST 13*  --   --   --   --   --   --   ?ALT 15  --   --   --   --   --   --   ?ALKPHOS 170*  --   --   --   --   --   --   ?BILITOT 0.6  --   --   --   --   --   --   ?GFRNONAA 3*   < > 3* 5* 6* 5*  --   ?ANIONGAP 19*   < > 15 13 11  18*  --   ? < > = values in this interval not displayed.  ?  ?Lipids  ?Recent Labs  ?Lab 04/25/21 ?0446  ?TRIG 54  ?  ?Hematology ?Recent Labs  ?Lab 04/22/21 ?0118 05/03/2021 ?0142 04/21/2021 ?0128 04/30/2021 ?1945  ?WBC 12.9* 14.9* 17.0*  --   ?RBC 4.26 4.43  4.48 4.14  --   ?HGB 12.0 12.4 11.4* 12.2  ?HCT 37.0  39.0 36.5 36.0  ?MCV 86.9 88.0 88.2  --   ?MCH 28.2 28.0 27.5  --   ?MCHC 32.4 31.8 31.2  --   ?RDW 13.8 14.1 13.8  --   ?PLT 421* 413* 401*  --   ? ?Thyroid No results for input(s): TSH, FREET4 in the last 168 hours.  ?BNPNo results for input(s): BNP, PROBNP in the last 168 hours.  ?DDimer No results for input(s): DDIMER in the last 168 hours.  ? ?Radiology  ?  ?DG Abd 1 View ? ?Result Date: 05/02/2021 ?CLINICAL DATA:  Enteric tube placement. EXAM: ABDOMEN - 1 VIEW COMPARISON:  Chest radiograph dated 04/11/2021. FINDINGS: Enteric tube with tip in the distal stomach. There is moderate stool throughout the colon. No bowel dilatation IMPRESSION: Enteric tube with tip in the distal stomach. Electronically Signed   By: Anner Crete M.D.   On: 05/03/2021 22:38  ? ?DG Chest Port 1 View ? ?Result Date: 04/25/2021 ?CLINICAL DATA:  Endotracheal tube inserted, check position EXAM: PORTABLE CHEST 1 VIEW COMPARISON:  05/02/2021 FINDINGS: Interval placement of an endotracheal tube, with tip approximately 2.5 cm of the carina. Interval placement of a right CVC, with tip approximating the right atrium. Unchanged cardiac and mediastinal contours. Aortic atherosclerosis. Improved lung aeration. No new focal pulmonary opacity. No pleural effusion or pneumothorax. No acute osseous abnormality. IMPRESSION: 1. Support apparatus in appropriate position. 2. Improved lung aeration. Electronically Signed   By: Merilyn Baba M.D.   On: 04/25/2021 18:04   ? ?Cardiac Studies  ? ?Rt and Lt cardiac cath 05/01/2021 ?CONCLUSIONS: ?Probable mixed ischemic and nonischemic systolic  heart failure, compensated with LVEDP normal but with elevated capillary wedge pressure. ?Hemodynamics/cardiac output suggest cardiogenic shock although measurements were made in the setting of intermittent supraventricular tachycardia at which time systolic blood pressures would be less than 80 mmHg. ?Three-vessel coronary artery disease with 75% mid LAD, 99% mid third  obtuse marginal, total occlusion of the circumflex beyond the third obtuse marginal, total occlusion of the dominant right coronary proximally.  There are left-to-right collaterals. ?  ?RECOMMENDATIONS: ?  ?Severe calcification of coronary arteries and very low ejection fraction and blood pressure make PCI unappealing.  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 ?Recommend advanced heart failure team consultation. ?Prognosis and treatment strategy are limited given end-stage renal disease. ?  ?Diagnostic ?Dominance: Right ?Echo 04/15/2021 ? 1. Left ventricular ejection fraction by 3D volume is 21 %. The left  ?ventricle has severely decreased function. The left ventricle demonstrates  ?global hypokinesis. Left ventricular diastolic parameters are consistent  ?with Grade I diastolic dysfunction  ?(impaired relaxation). Elevated left ventricular end-diastolic pressure.  ? 2. Right ventricular systolic function is moderately reduced. The right  ?ventricular size is mildly enlarged. There is normal pulmonary artery  ?systolic pressure.  ? 3. Left atrial size was mildly dilated.  ? 4. There is small to moderate circumferential pericardial effusion. There  ?is no evidence of cardiac tamponade.  ? 5. The mitral valve is normal in structure. Mild mitral valve  ?regurgitation. No evidence of mitral stenosis.  ? 6. The aortic valve is normal in structure. Aortic valve regurgitation is  ?not visualized. No aortic stenosis is present.  ? 7. The inferior vena cava is normal in size with greater than 50%  ?respiratory variability, suggesting right atrial pressure of 3 mmHg.  ? ?Comparison(s): No prior Echocardiogram.  ? ?Patient Profile  ?   ?61 y.o. female with history of end-stage renal disease on peritoneal dialysis now transitioning back to hemodialysis, hypertension, poorly controlled diabetes mellitus, hyperlipidemia, severe multivessel CAD and severe ischemic cardiomyopathy (EF approximately 20%) and  recent NSTEMI. Suffered bradycardic arrest with period of absent pulse during AV graft surgery 04/20/2021 ? ?Assessment & Plan  ?  ?Hemodynamically compensated, but unresponsive. ?Minor troponin increase, not

## 2021-04-25 NOTE — Progress Notes (Signed)
Update: ? ?PT has noticed increased weakness in UE as compared to her previous exams.  ?She was hypotensive. At risk for watershed infarcts.  ?CT head ordered.  ?Possibly need and MRI at some point  ? ?Garner Nash, DO ? Pulmonary Critical Care ?04/25/2021 4:12 PM   ? ? ?

## 2021-04-25 NOTE — Progress Notes (Signed)
Poorly function catheter.  Very positional.  Hard to aspirate both lumens and hard to flush arteriallumen,  this is after removing cathflo out of lumes post 1 hr dwell.  Called nephrology will finish this tx at a low blood flow rate if possible,  pack with cathflo until next scheduled treatment.   ?

## 2021-04-25 NOTE — Progress Notes (Signed)
eLink Physician-Brief Progress Note ?Patient Name: Ann Bryan ?DOB: 03-15-1960 ?MRN: [id number] ? ? ?Date of Service ? 04/25/2021  ?HPI/Events of Note ? CT scan results and images reviewed, no acute changes. Given new  apparent right sided weakness documented by PT and re-confirmed by bedside RN tonight, I will order and MRI brain wo contrast (no obvious contraindications).   ?eICU Interventions ? MRI brain wo contrast ordered.  ? ? ? ?  ? ?Kerry Kass Trannie Bardales ?04/25/2021, 10:27 PM ?

## 2021-04-25 NOTE — Progress Notes (Signed)
Initial Nutrition Assessment ? ?DOCUMENTATION CODES:  ? ?Non-severe (moderate) malnutrition in context of chronic illness (may possibly be severe, reassess on follow) ? ?INTERVENTION:  ? ?Recommend considering Cortrak placement ? ?Recommend initiation of Tube Feeding via OG: ?Vital 1.5 at 45 ml/hr ?Pro-Source TF 90 mL BID ?This goal rate provides 1780 kcals, 117 g of protein and 821 mL of free water ? ?Recommend holding binder therapy at present while NPO ? ?Given constipation with moderate stool burden on abd xray, recommend adjusting bowel regimen ? ?Add Renal MVI daily ? ?NUTRITION DIAGNOSIS:  ? ?Moderate Malnutrition related to chronic illness (ESRD on PD/HD) as evidenced by severe fat depletion, moderate muscle depletion, moderate fat depletion. ? ? ?GOAL:  ? ?Patient will meet greater than or equal to 90% of their needs ? ? ?MONITOR:  ? ?Vent status, Labs, Weight trends, TF tolerance ? ?REASON FOR ASSESSMENT:  ? ?Ventilator ?  ? ?ASSESSMENT:  ? ?83 you female admitted with generalized weakness and syncope and found to have NSTEMI. Pt previous on PD for ESRD but transitioned to iHD this admission due to concern for poor dialysis, poor compliance FTT. After revision of AV fistula, pt with possible arrest, intubated; now with acute encephalopathy. PMH includes ESRD on PD, DM, depression, HLD, HTN ?  ?4/11 Admitted ?4/16 IF consulted for HD cath placement ?4/19 OR for revision of AV fistula, developed bradycardia and hypotension, intubated, near/brief arrest ? ?Pt remains on vent support, off sedation, poor mentation.  ? ?Noted pt has transitioned from PD to [facility] this admission ? ?EDW 62 kg; currently under dry weight ? ?OG tube with tip in stomach per abd xray ? ?Constipated, no BM since 4/11. Moderate stool burden in colon per abd xray ? ?Based on limited exam today, pt at least meets criteria for moderate malnutrition but may very well have severe malnutrition. Plan to reassess on follow ? ?Labs: albumin <1.5,  corrected calcium >/= 9.2 (wdl), potassium 3.6 (wdl), phosphorus 3.9 (wdl) ?Meds: ss novolog, semglee, miralax, colace, renvela, calcitriol ? ?NUTRITION - FOCUSED PHYSICAL EXAM: ? ?Exam difficult today as pt receiving iHD and cathether site very positional and alarming often. Plan to repeat exam on follow ? ?Flowsheet Row Most Recent Value  ?Orbital Region Moderate depletion  ?Thoracic and Lumbar Region Unable to assess  ?Buccal Region Unable to assess  [ETT holder]  ?Temple Region Moderate depletion  ?Clavicle Bone Region Moderate depletion  ?Clavicle and Acromion Bone Region Moderate depletion  ?Scapular Bone Region Moderate depletion  ?Dorsal Hand Unable to assess  ?Patellar Region Severe depletion  ?Anterior Thigh Region Severe depletion  ?Posterior Calf Region Severe depletion  ?Edema (RD Assessment) Mild  ? ?  ? ? ?Diet Order:   ?Diet Order   ? ?       ?  Diet NPO time specified Except for: Sips with Meds  Diet effective midnight       ?  ?  Diet NPO time specified  Diet effective now       ?  ? ?  ?  ? ?  ? ? ?EDUCATION NEEDS:  ? ?Not appropriate for education at this time ? ?Skin:  Skin Assessment: Reviewed RN Assessment ? ?Last BM:  4/11 ? ?Height:  ? ?Ht Readings from Last 1 Encounters:  ?04/11/2021 5' (1.524 m)  ? ? ?Weight:  ? ?Wt Readings from Last 1 Encounters:  ?04/25/21 61.7 kg  ? ? ? ?BMI:  Body mass index is 26.57 kg/m?. ? ?Estimated Nutritional  Needs:  ? ?Kcal:  1700-1900 kcals ? ?Protein:  105-125 g ? ?Fluid:  1000 mL plus UOP ? ? ?Kerman Passey MS, RDN, LDN, CNSC ?Registered Dietitian III ?Clinical Nutrition ?RD Pager and On-Call Pager Number Located in [place]  ? ?

## 2021-04-25 NOTE — Progress Notes (Signed)
? ?                                                                                                                                                     ?                                                   ?Daily Progress Note  ? ?Patient Name: Ann Bryan       Date: 04/25/2021 ?DOB: 1960-06-23  Age: 61 y.o. MRN#: [id number] ?Attending Physician: Garner Nash, DO ?Primary Care Physician: Ladell Pier, MD ?Admit Date: 04/15/2021 ? ?Reason for Consultation/Follow-up: Establishing goals of care ? ?Patient Profile/HPI: 61 year old with a history of DM2, HTN, HLD, diabetic neuropathy, and ESRD on PD who presented to the ED with severe generalized weakness for approximately 6 to 7 days and an eventual pre-syncopal spell resulting in a fall without loss of consciousness.  After her fall she was too weak to get up and spent the night on the floor, thusly missing her PD.  A neighbor called EMS who arrived to find the patient on the floor with stable vital signs and a normal CBG.  In the ED the patient was hypothermic with temperature 95.4.  WBC was borderline at 10. ?  ?Palliative care has been asked to get involved to discuss goals of care in the setting of multiple co-morbidities.  ?   ? ?Subjective: ?Chart reviewed including labs, progress notes, imaging. Noted events of yesterday evening. Coded during fistula surgery yesterday afternoon- Taziah is now intubated. Code status has been reversed. She is not recovering her mental status- there is concern for watershed stroke due to hypoperfusion.  ?Spoke with her surrogate decision maker [name] Combo.  ?Tommie Sams notes that the day before the surgery they had spoken about her code status and she said she wanted to take her bracelet off.  ?Tommie Sams also states that Arien would not want to be prolonged artificially if she was not able to recover. Would not want trach, PEG.  ? ?Review of Systems  ?Unable to perform ROS: Intubated  ? ? ?Physical Exam ?Vitals and nursing note  reviewed.  ?Neurological:  ?   Comments: unresponsive  ?         ? ?Vital Signs: BP 107/64   Pulse 79   Temp 98 ?F (36.7 ?C)   Resp 17   Ht 5' (1.524 m)   Wt 61.7 kg   LMP 03/13/2002   SpO2 97%   BMI 26.57 kg/m?  ?SpO2: SpO2: 97 % ?O2 Device: O2 Device: Ventilator ?O2 Flow Rate: O2 Flow Rate (L/min): 2 L/min ? ?Intake/output summary:  ?Intake/Output Summary (Last 24 hours) at  04/25/2021 1615 ?Last data filed at 04/25/2021 1459 ?Gross per 24 hour  ?Intake 749.85 ml  ?Output 514 ml  ?Net 235.85 ml  ? ?LBM: Last BM Date : 04/13/2021 ?Baseline Weight: Weight: 65.8 kg ?Most recent weight: Weight: 61.7 kg ? ?     ?Palliative Assessment/Data: PPS: 10% ? ? ? ? ? ?Patient Active Problem List  ? Diagnosis Date Noted  ? Coronary artery disease involving native coronary artery of native heart with angina pectoris ([place]) 04/10/2021  ? Failure to thrive in adult 04/22/2021  ? Supraventricular tachycardia ([place]) 05/02/2021  ? Neuropathy of foot 04/29/2021  ? Rhabdomyolysis 04/22/2021  ? Hypothermia 04/19/2021  ? Observation after surgery 04/25/2021  ? Acute respiratory failure ([place])   ? Bradycardia   ? Syncope   ? Shock ([place])   ? Goals of care, counseling/discussion   ? Acute systolic CHF (congestive heart failure) ([place])   ? NSTEMI (non-ST elevated myocardial infarction) ([place]) 04/20/2021  ? Dermatitis 02/26/2021  ? Diabetic peripheral neuropathy ([place]) 02/26/2021  ? Type 2 diabetes mellitus with hyperglycemia, with long-term current use of insulin ([place]) 06/03/2019  ? Type 2 diabetes mellitus with proliferative retinopathy, with long-term current use of insulin ([place]) 06/03/2019  ? Type 2 diabetes mellitus with diabetic polyneuropathy, with long-term current use of insulin ([place]) 06/03/2019  ? Type 2 diabetes mellitus with chronic kidney disease on chronic dialysis, with long-term current use of insulin ([place]) 06/03/2019  ? Dyslipidemia 06/03/2019  ? ESRD on dialysis ([place]) 05/20/2019  ? Hyperkalemia, diminished renal excretion  01/11/2019  ? ESRD (end stage renal disease) on dialysis ([place]) 11/18/2018  ? Anemia in chronic kidney disease (CKD) 11/18/2018  ? Diabetes mellitus without complication ([place]) 88/50/2774  ? Nausea and vomiting 09/28/2017  ? Insomnia 06/05/2017  ? Chronic seasonal allergic rhinitis 12/10/2015  ? Arthralgia of both hands 12/10/2015  ? Severe episode of recurrent major depressive disorder, without psychotic features ([place]) 12/10/2015  ? NEPHROPATHY, DIABETIC 09/23/2006  ? Essential hypertension 12/23/2005  ? DM (diabetes mellitus) type II uncontrolled with eye manifestation 01/07/1984  ? ? ?Palliative Care Assessment & Plan  ? ? ?Assessment/Recommendations/Plan ? ?ESRD on HD- now with poor mental status, respiratory failure s/p code during surgical procedure ?GOC - continue current efforts, but would not want to be artificially prolonged if it was determined she was likely to have poor prognosis for recovery ?PMT will continue to follow and discuss GOC with [name] Combo her surrogate decision maker  ? ? ?Code Status: ?DNR ? ?Prognosis: ? Unable to determine ? ?Discharge Planning: ?To Be Determined ? ?Care plan was discussed with patient's surrogate decision maker- [name] Combo ? ?Thank you for allowing the Palliative Medicine Team to assist in the care of this patient. ? ?Total time:  60 mins ? ?   ?Greater than 50%  of this time was spent counseling and coordinating care related to the above assessment and plan. ? ?Mariana Kaufman, AGNP-C ?Palliative Medicine ? ? ?Please contact Palliative Medicine Team phone at [phone] for questions and concerns.  ? ? ? ? ? ? ?

## 2021-04-25 NOTE — Progress Notes (Addendum)
?[facility] ?Progress Note  ? ?Subjective:  during access procedure yesterday pt became bradycardic and hypotensive. Pt was intubated. Procedure was completed. Was weaned off pressors late last evening. BP's normal this am. Seen in ICU. Pt on vent.  ? ?Objective ?Vitals:  ? 04/25/21 0500 04/25/21 0530 04/25/21 0600 04/25/21 0735  ?BP: 109/69 110/69 105/67   ?Pulse:  81 81 78  ?Resp: 19 17 19 16   ?Temp:    99.2 ?F (37.3 ?C)  ?TempSrc:      ?SpO2:  98% 97% 99%  ?Weight:      ?Height:      ? ?Physical Exam ?Gen on vent, sedated ?Sclera anicteric, throat w/ ETT ?No jvd or bruits ?Chest clear anterior/ lateral ?RRR no RG ?Abd soft ntnd no mass or ascites +bs ?Ext no LE or UE edema ?Neuro is on vent, sedated ?  LUE AVF+bruit ? ? ?Dialysis Orders: ?Prev on CCPD - managed by Dr. Joelyn Oms ?Now transitioning to HD d/t PD failure - establishing orders. ?- 4/17 hep B Ag was neg and hep B Abs were > 10 (= protective)  ?  ?Assessment/Plan: ?Fall/AMS/weakness: multifactorial with new/severe HF + uremia + medications ?Severe systolic HF: New Dx, d/t #3, echo 4/11 with EF 21% ?Severe 3V CAD/NSTEMI: New Dx, LHC 4/14 without targets for intervention ?Bursts of SVT: On amiodarone, per cardiology. ?ESRD: Prev on CCPD, failing modality, ^ BUN, alb < 1.5 and progressive debility would need SNFP and SNF's do not take PD pts. She had to transition to HD. Doing HD here TTS for now. Needs CLIP. Getting HD in ICU today. Will plan next HD Friday since catheter function is not great today.  ?Dialysis access: IR placed [facility] 04/22/21. Renal SW working on HD placement. VVS revised pt's AVF yest 4/19. Will need PD cath removed at some point, likely as outpatient. TDC not working well today, will notify IR.  ?HTN/volume: BP stable at this time, no LE edema. Wt's down sig, may be dry. No UF w/ HD today.  ?MBD ckd: cont rocaltrol. Given severe arterial Ca++ have changed to renvela as nonCa++ binder.  ?Anemia of ESRD: Hgb > 12, no ESA  needed ?Nutrition: Alb < 1.5, adding supplements ?Chronic pain (legs): ?d/t fall v. neuropathy v. PAD ?GOC: will likely need SNF placement. ? ?Kelly Splinter, MD ?04/25/2021, 8:16 AM ? ? ? ? ?Medications: ? sodium chloride    ? norepinephrine (LEVOPHED) Adult infusion Stopped (04/10/2021 2145)  ? propofol (DIPRIVAN) infusion 15 mcg/kg/min (04/25/21 0500)  ? ? (feeding supplement) PROSource Plus  30 mL Oral BID BM  ? acetaminophen (TYLENOL) oral liquid 160 mg/5 mL  325 mg Per Tube Q6H  ? amiodarone  200 mg Per Tube BID  ? aspirin  81 mg Oral Daily  ? atorvastatin  80 mg Oral Daily  ? brimonidine  1 drop Left Eye BID  ? calcitRIOL  1 mcg Oral Once per day on Tue Thu Sat  ? colchicine  0.3 mg Oral Daily  ? docusate  100 mg Per Tube BID  ? feeding supplement (NEPRO CARB STEADY)  237 mL Oral BID BM  ? heparin  5,000 Units Subcutaneous Q8H  ? insulin aspart  0-6 Units Subcutaneous Q4H  ? insulin glargine-yfgn  18 Units Subcutaneous Daily  ? loratadine  10 mg Oral Daily  ? mouth rinse  15 mL Mouth Rinse 10 times per day  ? nystatin   Topical TID  ? pantoprazole sodium  40 mg Per Tube  Daily  ? polyethylene glycol  17 g Per Tube Daily  ? sevelamer carbonate  2,400 mg Oral TID WC  ? ? ? ? ?

## 2021-04-25 NOTE — Progress Notes (Signed)
eLink Physician-Brief Progress Note ?Patient Name: ANOUK CRITZER ?DOB: 1960-08-01 ?MRN: [id number] ? ? ?Date of Service ? 04/25/2021  ?HPI/Events of Note ? Patient is now off pressors and BP is 164/  ?eICU Interventions ? PRN iv Labetalol ordered for SBP > 150 mmHg.  ? ? ? ?  ? ?Kerry Kass Andrae Claunch ?04/25/2021, 12:24 AM ?

## 2021-04-25 NOTE — Progress Notes (Addendum)
This chaplain attempted F/U spiritual care to complete the Pt. HCPOA. At the time of the chaplain's visit the Pt. is participating in HD and intubated. The chaplain will communicate with Palliative Medicine and attempt a revisit to complete the Pt. HCPOA. ? ?**[id number]  This chaplain understands from the Pt. CM-Alesia and RN-[name] the Pt. niece-[name] is requesting an update on the Pt. HCPOA.  The chaplain unsuccessfully attempted a phone call to [facility] with out an opportunity to leave a voicemail.  ? ?The chaplain understands from the conversation with the Pt. RN-[name],  the Pt. completed HCPOA document is in the Pt. paper chart. The HCPOA has not been notarized. At the time the [facility] was filled out, the Pt. chose [name] Combo as her healthcare agent.  ? ?The chaplain will continue to follow the Pt. chart and communicate with the PMT for an appropriate time to notarize the Pt. HCPOA.  ? ?**1554 This chaplain unsuccessfully attempted to phone the Pt. Niece-[name]. VM was not available. ? ?Chaplain Sallyanne Kuster ?[phone] ?

## 2021-04-25 NOTE — Progress Notes (Signed)
removed activase previously instilled from first tx attempt. catheter still not working,  hard to aspirate both lumens . hard to flush arterial lumen, despite repositioning patient and lines reversal.    read a note from vascular surgeon this morning stating can attempt cannulation with next dialysis.  unable to hear bruit of feel thrill, only able to feel or hear a pulse at anastamosis.  spoke to nephrology .  attempted to cannulate x 2 obtained black blood only with no flow,  2 bandages to lua with minimal bleeding.  pre bp 135/70 post bp 112/101  pre weight 61.8kg post 62.0kg.  removed no fluid as ordered.  many alarms throughout tx, arterial, venous pressure alarms and clotting due to low slow flow gave ns bolus to prevent clotting and 2000 units of heparin .  nephrology aware and ordered cathflo packed post tx to dwell until next scheduled dialysis treatment, ?

## 2021-04-25 NOTE — Progress Notes (Signed)
Hemodialysis catheter not working,  tough to aspirate both lumens tough to flush arterial lumen constant alarms on machine, tried reversing lines,  flushing repositioning patient packed with activase at this time, will let dwell 30 min to 1 hour.  ? ? ? ? ?

## 2021-04-25 NOTE — Progress Notes (Signed)
PT Cancellation Note ? ?Patient Details ?Name: Ann Bryan ?MRN: [id number] ?DOB: October 28, 1960 ? ? ?Cancelled Treatment:    Reason Eval/Treat Not Completed: Fatigue/lethargy limiting ability to participate. RN reporting they took pt off sedation this morning, but pt remains very lethargic and non-purposefully moving limbs, requesting PT check back at a later time. Will plan to follow-up later as time permits. ? ? ?Moishe Spice, PT, DPT ?Acute Rehabilitation Services  ?Pager: [phone] ?Office: [phone] ? ? ? ?[name] ?04/25/2021, 8:36 AM ? ? ?

## 2021-04-25 NOTE — Progress Notes (Addendum)
Occupational Therapy Re-evaluation ?Patient Details ?Name: Ann Bryan ?MRN: [id number] ?DOB: 19-Mar-1960 ?Today's Date: 04/25/2021 ? ? ?History of present illness 61 y.o. female presenting to ED on 04/22/2021 with generalized weakness (L>R) and pre-syncopal episode. CT of head negative. Pt admitted with NSTEMI with elevated troponin and echo displaying severe LV and RV dysfunction. Pt also with questionable hypoglycemic episode and sepsis. PMH: DM2, HTN, HLD, diabetic neuropathy, and ESRD on PD ?  ?OT comments ? Upon arrival, pt supine in bed and intubated. Pt demonstrating significant functional change compared to prior session. Pt opening her eyes once or twice to calling of her name and with sitting balance challenge. Pt requiring Total A for ADLs, bed mobility, and sitting balance. Pt with active movement of LUE/LLE. No active movement or withdrawal to pain of RUE/RLE. Also noting pt keeping her R eyelid closed throughout. Pt not following commands and engaging. Notified RN and MD of functional change. Continue to recommend dc to SNF and will continue to follow acutely as admitted. Downgraded goals.  ? ?Recommendations for follow up therapy are one component of a multi-disciplinary discharge planning process, led by the attending physician.  Recommendations may be updated based on patient status, additional functional criteria and insurance authorization. ?   ?Follow Up Recommendations ? Skilled nursing-short term rehab (<3 hours/day)  ?  ?Assistance Recommended at Discharge Frequent or constant Supervision/Assistance  ?Patient can return home with the following ? A little help with walking and/or transfers;A little help with bathing/dressing/bathroom;Assistance with cooking/housework;Assist for transportation ?  ?Equipment Recommendations ? Other (comment) (Defer to next venue)  ?  ?Recommendations for Other Services PT consult ? ?  ?Precautions / Restrictions Precautions ?Precautions: Fall ?Precaution Comments:  watch HR ?Restrictions ?Weight Bearing Restrictions: No  ? ? ?  ? ?Mobility Bed Mobility ?Overal bed mobility: Needs Assistance ?Bed Mobility: Supine to Sit, Sit to Supine ?  ?  ?Supine to sit: Total assist, +2 for physical assistance ?Sit to supine: Total assist, +2 for physical assistance ?  ?General bed mobility comments: Total A for bed mobility ?  ? ?Transfers ?  ?  ?  ?  ?  ?  ?  ?  ?  ?General transfer comment: Defer due to safety and engagement ?  ?  ?Balance Overall balance assessment: Needs assistance ?Sitting-balance support: No upper extremity supported, Feet supported ?Sitting balance-Leahy Scale: Zero ?Sitting balance - Comments: Total A for sitting balance ?  ?  ?  ?  ?  ?  ?  ?  ?  ?  ?  ?  ?  ?  ?  ?   ? ?ADL either performed or assessed with clinical judgement  ? ?ADL Overall ADL's : Needs assistance/impaired ?  ?  ?  ?  ?  ?  ?  ?  ?  ?  ?  ?  ?  ?  ?  ?  ?  ?  ?  ?General ADL Comments: Total A for ADLs. No engagement ?  ? ?Extremity/Trunk Assessment Upper Extremity Assessment ?Upper Extremity Assessment: RUE deficits/detail;LUE deficits/detail ?RUE Deficits / Details: No active movement or withdrawls to pain ?RUE Coordination: decreased gross motor;decreased fine motor ?LUE Deficits / Details: Moving LUE against gravity. resisenting PROM. ?LUE Coordination: decreased fine motor ?  ?Lower Extremity Assessment ?Lower Extremity Assessment: Defer to PT evaluation ?RLE Deficits / Details: MMT scores of 4 hip flexion, 5 knee extension, 4+ ankle dorsiflexion; hx of peripheral neuropathy, denies other numbness/tingling ?RLE Sensation: history of  peripheral neuropathy ?LLE Deficits / Details: MMT scores of 4- hip flexion, 4+ knee extension, 4 ankle dorsiflexion; hx of peripheral neuropathy, denies other numbness/tingling ?LLE Sensation: history of peripheral neuropathy ?  ?  ?  ? ?Vision   ?  ?  ?Perception   ?  ?Praxis   ?  ? ?Cognition Arousal/Alertness: Awake/alert ?Behavior During Therapy: [facility] for  tasks assessed/performed ?Overall Cognitive Status: Difficult to assess ?  ?  ?  ?  ?  ?  ?  ?  ?  ?  ?  ?  ?  ?  ?  ?  ?General Comments: Eyes closed for majority of session. Opening eyes once or twice to name. Not following commands. ?  ?  ?   ?Exercises   ? ?  ?Shoulder Instructions   ? ? ?  ?General Comments VSS on vent  ? ? ?Pertinent Vitals/ Pain       Pain Assessment ?Pain Assessment: Faces ?Faces Pain Scale: No hurt ?Pain Intervention(s): Monitored during session, Repositioned ? ?Home Living   ?  ?  ?  ?  ?  ?  ?  ?  ?  ?  ?  ?  ?  ?  ?  ?  ?  ?  ? ?  ?Prior Functioning/Environment    ?  ?  ?  ?   ? ?Frequency ? Min 2X/week  ? ? ? ? ?  ?Progress Toward Goals ? ?OT Goals(current goals can now be found in the care plan section) ? Progress towards OT goals: Not progressing toward goals - comment ? ?Acute Rehab OT Goals ?OT Goal Formulation: With patient ?Time For Goal Achievement: 05/02/21 ?Potential to Achieve Goals: Good ?ADL Goals ?Pt Will Perform Grooming: with modified independence;standing ?Pt Will Perform Lower Body Dressing: with modified independence;sit to/from stand ?Pt Will Transfer to Toilet: with modified independence;ambulating;bedside commode ?Pt Will Perform Toileting - Clothing Manipulation and hygiene: with modified independence;sitting/lateral leans;sit to/from stand ?Pt Will Perform Tub/Shower Transfer: Tub transfer;with min guard assist;ambulating;3 in 1;rolling walker ?Additional ADL Goal #1: Pt will demonstrate increased memory to recall 5/5 grooming tasks to complete with Supervision  ?Plan Discharge plan remains appropriate   ? ?Co-evaluation ? ? ?   ?  ?  ?  ?  ? ?  ?AM-PAC OT "6 Clicks" Daily Activity     ?Outcome Measure ? ? Help from another person eating meals?: Total ?Help from another person taking care of personal grooming?: Total ?Help from another person toileting, which includes using toliet, bedpan, or urinal?: Total ?Help from another person bathing (including washing,  rinsing, drying)?: Total ?Help from another person to put on and taking off regular upper body clothing?: Total ?Help from another person to put on and taking off regular lower body clothing?: Total ?6 Click Score: 6 ? ?  ?End of Session   ? ?OT Visit Diagnosis: Other abnormalities of gait and mobility (R26.89);Unsteadiness on feet (R26.81);Muscle weakness (generalized) (M62.81) ?  ?Activity Tolerance Patient limited by lethargy ?  ?Patient Left in bed;with call bell/phone within reach;with bed alarm set ?  ?Nurse Communication Mobility status ?  ? ?   ? ?Time: 1542-1600 ?OT Time Calculation (min): 18 min ? ?Charges: OT General Charges ?$OT Visit: 1 Visit ?OT Evaluation ?$OT Re-eval: 1 Re-eval ? ?Amyra Vantuyl MSOT, OTR/L ?Acute Rehab ?Pager: [phone] ?Office: [phone] ? ?Petrita Blunck M Camri Molloy ?04/25/2021, 4:55 PM ? ? ?

## 2021-04-25 NOTE — Anesthesia Postprocedure Evaluation (Signed)
Anesthesia Post Note ? ?Patient: Ann Bryan ? ?Procedure(s) Performed: LEFT ARM CEPHALIC  VEIN  TURNDOWN (Left: Arm Upper) ? ?  ? ?Patient location during evaluation: ICU ?Anesthesia Type: General ?Level of consciousness: patient remains intubated per anesthesia plan ?Pain management: pain level controlled ?Vital Signs Assessment: post-procedure vital signs reviewed and stable ?Respiratory status: patient on ventilator - see flowsheet for VS ?Cardiovascular status: blood pressure returned to baseline and stable ?Postop Assessment: no apparent nausea or vomiting ?Anesthetic complications: no ? ? ?No notable events documented. ? ?Last Vitals:  ?Vitals:  ? 04/25/21 0600 04/25/21 0735  ?BP: 105/67   ?Pulse: 81 78  ?Resp: 19 16  ?Temp:  37.3 ?C  ?SpO2: 97% 99%  ?  ?Last Pain:  ?Vitals:  ? 04/25/21 0400  ?TempSrc: Axillary  ?PainSc:   ? ? ?  ?  ?  ?  ?  ?  ? ?Ann Bryan ? ? ? ? ?

## 2021-04-25 NOTE — Progress Notes (Signed)
OT Cancellation Note ? ?Patient Details ?Name: ADLENE ADDUCI ?MRN: [id number] ?DOB: 12-Apr-1960 ? ? ?Cancelled Treatment:    Reason Eval/Treat Not Completed: Patient at procedure or test/ unavailable (Dialysis. Will return as schedule allows.) ? ?Nalany Steedley M Daviona Herbert ?Carrell [place] MSOT, OTR/L ?Acute Rehab ?Pager: [phone] ?Office: [phone] ?04/25/2021, 1:34 PM ?

## 2021-04-25 NOTE — Progress Notes (Signed)
? ?NAME:  Ann Bryan, MRN:  [id number], DOB:  [birth year], LOS: 9 ?ADMISSION DATE:  04/27/2021, CONSULTATION DATE:  4/19 ?REFERRING MD:  Dr. Trula Slade, CHIEF COMPLAINT:  bradycardia; intubated for   ? ?History of Present Illness:  ?This is a 61 year old female with past medical history of DM, HTN, HLD, neuropathy, ESRD on PD.  She presented to [facility] ED 04/06/2021 with generalized weakness and syncope. ?  ?In ED, she was found to have NSTEMI.  Her syncopal episode was felt to be due to hypoglycemia after her insulin regimen was adjusted recently before this episode. ?  ?She was admitted by [facility].  She was seen in consultation by cardiology after she was found to have new systolic CHF and possible cardiogenic shock in the setting of NSTEMI.  Right left heart cath showed severe three-vessel CAD for which PCI was not felt to be a great option nor was mechanical support.  It was recommended to manage NSTEMI medically though very little room for GDMT. ?  ?Palliative care was consulted 4/15 due to her multiple comorbidities and limited options.  After discussions, patient requested to be DNR. ?  ?IR consulted 4/16 for tunneled HD cath placement as it was felt that she was not tolerating PD well at home.  4/17, vascular surgery consulted for evaluation of new access either AVF versus AVG for long-term HD access. ?  ?4/19, she was taken to the operating room for new AVF placement.  Either peri or postop, she developed bradycardia and hypotension.  She was subsequently intubated due to concerns for near arrest.  Started on levo/neo for hypotension. No compressions. Unknown if patient lost pulses. PCCM was called in consultation and for assistance in the ICU. ? ?Pertinent  Medical History  ? ?Past Medical History:  ?Diagnosis Date  ? Anemia   ? Cataract   ? right eye  ? Complication of anesthesia   ?  " I woke up itching from anesthesia many years ago."   ? Depression   ? at times  ? Diabetes mellitus 1986  ?  poorly controlled last  hemoglobin A1c  10.5 on January 01, 2010, increase since June 2011 when hemoglobin A1c was 9.3 , determined to be most likely secondary to non-compliance.  ? Diabetic nephropathy ([place])   ? Diabetic retinopathy   ? Dyspnea   ? "when I have too much fluid"  ? End stage renal disease on dialysis [facility])   ? T/Th/Sat [address]  ? Glaucoma   ? History of blood transfusion   ? Hyperlipidemia   ? Hypertension   ? Nausea and vomiting   ? Neuropathy   ? Retinopathy   ? Wears glasses   ? ? ? ?Significant Hospital Events: ?Including procedures, antibiotic start and stop dates in addition to other pertinent events   ?4/11: admitted w/ syncope ?4/19: went to OR to obtain fistula graft revision; during procedure patient developed severe bradycardia 30-40s, became unresponsive, hypotensive started on levo/neo, unknown if lost pulses. Patient intubated. No compressions with patient being DNR. Admitted to ICU ? ?Interim History / Subjective:  ? ?Remains critically ill intubated on life support. Off pressors. On prop  ? ?Objective   ?Blood pressure 105/67, pulse 78, temperature 99.2 ?F (37.3 ?C), resp. rate 16, height 5' (1.524 m), weight 60.7 kg, last menstrual period 03/13/2002, SpO2 99 %. ?   ?Vent Mode: CPAP;PSV ?FiO2 (%):  [30 %-100 %] 30 % ?Set Rate:  [18 bmp] 18 bmp ?Vt Set:  [341  mL-450 mL] 360 mL ?PEEP:  [5 cmH20] 5 cmH20 ?Pressure Support:  [5 cmH20] 5 cmH20 ?Plateau Pressure:  [12 cmH20-19 cmH20] 13 cmH20  ? ?Intake/Output Summary (Last 24 hours) at 04/25/2021 0820 ?Last data filed at 04/25/2021 0500 ?Gross per 24 hour  ?Intake 488.19 ml  ?Output 305 ml  ?Net 183.19 ml  ? ?Filed Weights  ? 05/02/2021 0855 04/09/2021 1200 04/29/2021 0504  ?Weight: 61.7 kg 60 kg 60.7 kg  ? ? ?Examination: ?General:  critically ill, intubated on pressors.  ?HEENT: ETT in place  ?Neuro: Left pupil post cataract surgery, right pupil normal, will not follow commands.  Off sedation. ?CV: Regular rate rhythm, S1-S2 ?PULM: Bilateral mechanically  ventilated breath sounds ?GI: Soft, nontender nondistended, bowel sounds present ?Extremities: No significant edema ?Skin: No rash, right tunneled catheter anterior chest ? ? ?Resolved Hospital Problem list   ? ? ?Assessment & Plan:  ? ?Bradycardia w/ syncope: post procedure on 4/19 during fistula graft revision ?Possible PEA arrest, however was brief, no chest compressions ?Shock ?P: ?Heart rate issues resolved ?Remains intubated on mechanical life support ?Continue supportive care ?Weaned off of vasopressors ? ?Acute respiratory failure due to above ?P: ?Continue low tidal volume ventilation strategy ?Wean PEEP and FiO2 to maintain sats above 92%. ?VAP bundle ?Daily SBT SAT ?Hopefully can consider liberation from mechanical support today based on mental status improvement. ? ?Acute encephalopathy ?P: ?I believe all of her encephalopathy is related to above. ?Continue to hold her sedative medications ?She is definitely at risk for possible anoxic injury if she had low flow state to the brain in the setting of already depressed left ventricular ejection fraction. ?If she does not wake up anymore in the next 24 to 48 hours we would consider MRI. ? ?NSTEMI ?CAD ?HLD ?P: ?Continue aspirin statin, holding Coreg ? ?SVT ?P: ?Continue amiodarone ?On telemetry monitoring ? ?ESRD: patient was on PD outpatient; AV fistula graft revised 4/19 for HD ?P: ?-AV fistula graft revised 4/19 ?HD per nephrology ? ?Failure to thrive ?P: ?-Palliative still following. ?Was a DNR rescinded now full code ? ?HTN ?Acute on chronic systolic CHF: echo EF 33-82% ?P: ?HD per nephrology for volume removal ?Follow I's and O's ? ?T2DM ?P: ?CBGs with SSI ? ?Anemia of CKD ?P: ?Folate iron B12 ?Follow CBC for any acute signs of bleeding ? ?Neuropathy of foot ?P: ?Holding gabapentin in the setting of postarrest encephalopathy. ? ? ?Best Practice (right click and "Reselect all SmartList Selections" daily)  ? ?Diet/type: NPO w/ meds via tube ?DVT  prophylaxis: prophylactic heparin  ?GI prophylaxis: PPI ?Lines: N/A ?Foley:  N/A ?Code Status:  full code ?Last date of multidisciplinary goals of care discussion  ? ?[4/19 spoke with niece Tommie Sams and updated over phone. She resides in [place], [place]. She states that she visited [place] on 4/18 and had discussion that she wanted to be full code and not DNR. She states that Jazmine would not want to be on ventilator long term. She would like [place] to change code status to full code. ] ? ?Labs   ?CBC: ?Recent Labs  ?Lab 04/19/21 ?5053 04/21/21 ?9767 04/22/21 ?0118 05/04/2021 ?0142 04/19/2021 ?0128 04/07/2021 ?1945  ?WBC 12.5* 11.9* 12.9* 14.9* 17.0*  --   ?HGB 13.6 11.8* 12.0 12.4 11.4* 12.2  ?HCT 40.3 36.2 37.0 39.0 36.5 36.0  ?MCV 83.8 85.2 86.9 88.0 88.2  --   ?PLT 304 413* 421* 413* 401*  --   ? ? ?Basic Metabolic Panel: ?Recent Labs  ?Lab  04/20/21 ?0103 04/21/21 ?1856 04/22/21 ?0118 04/25/2021 ?0142 04/29/2021 ?3149 04/11/2021 ?1814 04/19/2021 ?1945  ?NA 132* 132* 135 136 132* 134* 130*  ?K 3.9 3.8 3.6 4.5 4.7 5.0 4.9  ?CL 96* 96* 98 98 97* 101  --   ?CO2 20* 21* 22 25 24  15*  --   ?GLUCOSE 391* 386* 154* 146* 175* 163*  --   ?BUN 92* 80* 80* 44* 31* 36*  --   ?CREATININE 13.52* 12.76* 13.05* 8.98* 7.06* 7.90*  --   ?CALCIUM 6.9* 7.3* 8.1* 8.0* 8.1* 8.3*  --   ?MG  --   --   --  1.7  --  2.0  --   ?PHOS 8.7* 7.5* 6.9* 5.3* 5.8*  --   --   ? ?GFR: ?Estimated Creatinine Clearance: 6.2 mL/min (A) (by C-G formula based on SCr of 7.9 mg/dL (H)). ?Recent Labs  ?Lab 04/21/21 ?0332 04/22/21 ?0118 05/05/2021 ?0142 04/11/2021 ?0128 04/17/2021 ?1814 04/19/2021 ?2152 04/25/21 ?7026 04/25/21 ?0446  ?WBC 11.9* 12.9* 14.9* 17.0*  --   --   --   --   ?LATICACIDVEN  --   --   --   --  4.6* 1.8 1.9 1.8  ? ? ?Liver Function Tests: ?Recent Labs  ?Lab 04/19/21 ?3785 04/20/21 ?0103 04/21/21 ?8850 04/22/21 ?0118 04/22/2021 ?0142 05/02/2021 ?2774  ?AST 13*  --   --   --   --   --   ?ALT 15  --   --   --   --   --   ?ALKPHOS 170*  --   --   --   --   --   ?BILITOT 0.6  --    --   --   --   --   ?PROT 4.3*  --   --   --   --   --   ?ALBUMIN <1.5* <1.5* <1.5* <1.5* <1.5* <1.5*  ? ?No results for input(s): LIPASE, AMYLASE in the last 168 hours. ?No results for input(s): AMMONIA in the las

## 2021-04-26 ENCOUNTER — Encounter (HOSPITAL_COMMUNITY): Admission: EM | Disposition: E | Payer: Self-pay | Source: Other Acute Inpatient Hospital | Attending: Internal Medicine

## 2021-04-26 ENCOUNTER — Inpatient Hospital Stay (HOSPITAL_COMMUNITY): Payer: Medicare Other

## 2021-04-26 ENCOUNTER — Inpatient Hospital Stay (HOSPITAL_COMMUNITY): Payer: Medicare Other | Admitting: Anesthesiology

## 2021-04-26 ENCOUNTER — Inpatient Hospital Stay (HOSPITAL_COMMUNITY): Payer: Medicare Other | Admitting: Certified Registered Nurse Anesthetist

## 2021-04-26 DIAGNOSIS — T85611A Breakdown (mechanical) of intraperitoneal dialysis catheter, initial encounter: Secondary | ICD-10-CM

## 2021-04-26 DIAGNOSIS — N186 End stage renal disease: Secondary | ICD-10-CM

## 2021-04-26 DIAGNOSIS — I2510 Atherosclerotic heart disease of native coronary artery without angina pectoris: Secondary | ICD-10-CM

## 2021-04-26 DIAGNOSIS — L7632 Postprocedural hematoma of skin and subcutaneous tissue following other procedure: Secondary | ICD-10-CM

## 2021-04-26 DIAGNOSIS — N185 Chronic kidney disease, stage 5: Secondary | ICD-10-CM | POA: Diagnosis not present

## 2021-04-26 DIAGNOSIS — I97638 Postprocedural hematoma of a circulatory system organ or structure following other circulatory system procedure: Secondary | ICD-10-CM | POA: Diagnosis not present

## 2021-04-26 DIAGNOSIS — E1122 Type 2 diabetes mellitus with diabetic chronic kidney disease: Secondary | ICD-10-CM

## 2021-04-26 DIAGNOSIS — Z992 Dependence on renal dialysis: Secondary | ICD-10-CM | POA: Diagnosis not present

## 2021-04-26 DIAGNOSIS — Z515 Encounter for palliative care: Secondary | ICD-10-CM | POA: Diagnosis not present

## 2021-04-26 DIAGNOSIS — Z7189 Other specified counseling: Secondary | ICD-10-CM | POA: Diagnosis not present

## 2021-04-26 DIAGNOSIS — I132 Hypertensive heart and chronic kidney disease with heart failure and with stage 5 chronic kidney disease, or end stage renal disease: Secondary | ICD-10-CM

## 2021-04-26 DIAGNOSIS — T85691A Other mechanical complication of intraperitoneal dialysis catheter, initial encounter: Secondary | ICD-10-CM

## 2021-04-26 DIAGNOSIS — J9601 Acute respiratory failure with hypoxia: Secondary | ICD-10-CM | POA: Diagnosis not present

## 2021-04-26 DIAGNOSIS — T82868A Thrombosis of vascular prosthetic devices, implants and grafts, initial encounter: Secondary | ICD-10-CM | POA: Diagnosis not present

## 2021-04-26 DIAGNOSIS — I509 Heart failure, unspecified: Secondary | ICD-10-CM

## 2021-04-26 DIAGNOSIS — I97621 Postprocedural hematoma of a circulatory system organ or structure following other procedure: Secondary | ICD-10-CM

## 2021-04-26 HISTORY — PX: INSERTION OF DIALYSIS CATHETER: SHX1324

## 2021-04-26 HISTORY — PX: HEMATOMA EVACUATION: SHX5118

## 2021-04-26 HISTORY — PX: THROMBECTOMY W/ EMBOLECTOMY: SHX2507

## 2021-04-26 LAB — GLUCOSE, CAPILLARY
Glucose-Capillary: 121 mg/dL — ABNORMAL HIGH (ref 70–99)
Glucose-Capillary: 126 mg/dL — ABNORMAL HIGH (ref 70–99)
Glucose-Capillary: 132 mg/dL — ABNORMAL HIGH (ref 70–99)
Glucose-Capillary: 144 mg/dL — ABNORMAL HIGH (ref 70–99)
Glucose-Capillary: 186 mg/dL — ABNORMAL HIGH (ref 70–99)
Glucose-Capillary: 201 mg/dL — ABNORMAL HIGH (ref 70–99)
Glucose-Capillary: 208 mg/dL — ABNORMAL HIGH (ref 70–99)
Glucose-Capillary: 27 mg/dL — CL (ref 70–99)
Glucose-Capillary: 28 mg/dL — CL (ref 70–99)
Glucose-Capillary: 281 mg/dL — ABNORMAL HIGH (ref 70–99)
Glucose-Capillary: 41 mg/dL — CL (ref 70–99)
Glucose-Capillary: 49 mg/dL — ABNORMAL LOW (ref 70–99)
Glucose-Capillary: 60 mg/dL — ABNORMAL LOW (ref 70–99)
Glucose-Capillary: 66 mg/dL — ABNORMAL LOW (ref 70–99)
Glucose-Capillary: 70 mg/dL (ref 70–99)
Glucose-Capillary: 80 mg/dL (ref 70–99)
Glucose-Capillary: <10 mg/dL — CL (ref 70–99)
Glucose-Capillary: <10 mg/dL — CL (ref 70–99)

## 2021-04-26 LAB — MAGNESIUM
Magnesium: 1.8 mg/dL (ref 1.7–2.4)
Magnesium: 1.8 mg/dL (ref 1.7–2.4)

## 2021-04-26 LAB — PHOSPHORUS
Phosphorus: 6.3 mg/dL — ABNORMAL HIGH (ref 2.5–4.6)
Phosphorus: 6.6 mg/dL — ABNORMAL HIGH (ref 2.5–4.6)

## 2021-04-26 SURGERY — EVACUATION HEMATOMA
Anesthesia: General | Site: Arm Upper | Laterality: Right

## 2021-04-26 SURGERY — INSERTION OF DIALYSIS CATHETER
Anesthesia: General | Site: Neck | Laterality: Right

## 2021-04-26 MED ORDER — DEXTROSE 50 % IV SOLN
INTRAVENOUS | Status: AC
  Filled 2021-04-26: qty 50

## 2021-04-26 MED ORDER — HEPARIN 6000 UNIT IRRIGATION SOLUTION
Status: AC
  Filled 2021-04-26: qty 500

## 2021-04-26 MED ORDER — DEXTROSE 50 % IV SOLN
INTRAVENOUS | Status: AC
  Administered 2021-04-26: 50 mL
  Filled 2021-04-26: qty 50

## 2021-04-26 MED ORDER — DEXMEDETOMIDINE HCL IN NACL 400 MCG/100ML IV SOLN
0.4000 ug/kg/h | INTRAVENOUS | Status: DC
  Administered 2021-04-26 (×2): 0.4 ug/kg/h via INTRAVENOUS
  Administered 2021-04-27: 0.6 ug/kg/h via INTRAVENOUS
  Administered 2021-04-27: 0.599 ug/kg/h via INTRAVENOUS
  Administered 2021-04-28: 1 ug/kg/h via INTRAVENOUS
  Filled 2021-04-26 (×6): qty 100

## 2021-04-26 MED ORDER — 0.9 % SODIUM CHLORIDE (POUR BTL) OPTIME
TOPICAL | Status: DC | PRN
  Administered 2021-04-26: 1000 mL

## 2021-04-26 MED ORDER — DEXTROSE 50 % IV SOLN: 25.0000 g | INTRAVENOUS | Status: AC

## 2021-04-26 MED ORDER — ROCURONIUM BROMIDE 100 MG/10ML IV SOLN
INTRAVENOUS | Status: DC | PRN
  Administered 2021-04-26: 30 mg via INTRAVENOUS

## 2021-04-26 MED ORDER — DEXTROSE 10 % IV SOLN: INTRAVENOUS | Status: DC

## 2021-04-26 MED ORDER — STERILE WATER FOR IRRIGATION IR SOLN
Status: DC | PRN
  Administered 2021-04-26: 1000 mL

## 2021-04-26 MED ORDER — HEPARIN 6000 UNIT IRRIGATION SOLUTION
Status: DC | PRN
  Administered 2021-04-26: 1

## 2021-04-26 MED ORDER — HEPARIN SODIUM (PORCINE) 1000 UNIT/ML IJ SOLN
INTRAMUSCULAR | Status: DC | PRN
  Administered 2021-04-26 (×2): 1.6 [IU]

## 2021-04-26 MED ORDER — PROSOURCE TF PO LIQD
90.0000 mL | Freq: Two times a day (BID) | ORAL | Status: DC
  Administered 2021-04-26 – 2021-04-28 (×5): 90 mL
  Filled 2021-04-26 (×5): qty 90

## 2021-04-26 MED ORDER — LIDOCAINE-EPINEPHRINE (PF) 1 %-1:200000 IJ SOLN
INTRAMUSCULAR | Status: AC
  Filled 2021-04-26: qty 30

## 2021-04-26 MED ORDER — VITAL 1.5 CAL PO LIQD
1000.0000 mL | ORAL | Status: DC
Start: 2021-04-26 — End: 2021-04-28
  Administered 2021-04-26 – 2021-04-27 (×2): 1000 mL

## 2021-04-26 MED ORDER — CEFAZOLIN SODIUM-DEXTROSE 2-3 GM-%(50ML) IV SOLR
INTRAVENOUS | Status: DC | PRN
  Administered 2021-04-26: 2 g via INTRAVENOUS

## 2021-04-26 MED ORDER — DEXTROSE 50 % IV SOLN
1.0000 | Freq: Once | INTRAVENOUS | Status: AC
  Administered 2021-04-26: 50 mL via INTRAVENOUS

## 2021-04-26 MED ORDER — DEXTROSE 50 % IV SOLN
INTRAVENOUS | Status: AC
  Administered 2021-04-26: 25 g via INTRAVENOUS
  Filled 2021-04-26: qty 50

## 2021-04-26 MED ORDER — DEXTROSE 50 % IV SOLN: 1.0000 | Freq: Once | INTRAVENOUS | Status: AC

## 2021-04-26 MED ORDER — LACTATED RINGERS IV SOLN: INTRAVENOUS | Status: DC | PRN

## 2021-04-26 MED ORDER — HEPARIN SODIUM (PORCINE) 1000 UNIT/ML IJ SOLN
INTRAMUSCULAR | Status: AC
  Filled 2021-04-26: qty 10

## 2021-04-26 MED ORDER — SODIUM CHLORIDE 0.9 % IR SOLN
Status: DC | PRN
  Administered 2021-04-26: 1000 mL

## 2021-04-26 MED ORDER — HEPARIN SODIUM (PORCINE) 1000 UNIT/ML IJ SOLN
INTRAMUSCULAR | Status: DC | PRN
  Administered 2021-04-26: 3000 [IU] via INTRAVENOUS

## 2021-04-26 SURGICAL SUPPLY — 50 items
APL PRP STRL LF DISP 70% ISPRP (MISCELLANEOUS) ×2
APL SKNCLS STERI-STRIP NONHPOA (GAUZE/BANDAGES/DRESSINGS) ×2
BAG COUNTER SPONGE SURGICOUNT (BAG) ×4 IMPLANT
BAG SPNG CNTER NS LX DISP (BAG) ×2
BANDAGE ESMARK 6X9 LF (GAUZE/BANDAGES/DRESSINGS) IMPLANT
BENZOIN TINCTURE PRP APPL 2/3 (GAUZE/BANDAGES/DRESSINGS) ×4 IMPLANT
BNDG CMPR 9X6 STRL LF SNTH (GAUZE/BANDAGES/DRESSINGS)
BNDG ESMARK 6X9 LF (GAUZE/BANDAGES/DRESSINGS)
CANISTER SUCT 3000ML PPV (MISCELLANEOUS) ×4 IMPLANT
CHLORAPREP W/TINT 26 (MISCELLANEOUS) ×4 IMPLANT
CUFF TOURN SGL QUICK 18X4 (TOURNIQUET CUFF) IMPLANT
CUFF TOURN SGL QUICK 24 (TOURNIQUET CUFF)
CUFF TOURN SGL QUICK 34 (TOURNIQUET CUFF)
CUFF TOURN SGL QUICK 42 (TOURNIQUET CUFF) IMPLANT
CUFF TRNQT CYL 24X4X16.5-23 (TOURNIQUET CUFF) IMPLANT
CUFF TRNQT CYL 34X4.125X (TOURNIQUET CUFF) IMPLANT
DRAIN CHANNEL 15F RND FF W/TCR (WOUND CARE) IMPLANT
DRAIN PENROSE 0.25X12 (DRAIN) ×1 IMPLANT
ELECT REM PT RETURN 9FT ADLT (ELECTROSURGICAL) ×3
ELECTRODE REM PT RTRN 9FT ADLT (ELECTROSURGICAL) ×3 IMPLANT
EVACUATOR SILICONE 100CC (DRAIN) IMPLANT
GAUZE SPONGE 4X4 12PLY STRL (GAUZE/BANDAGES/DRESSINGS) ×4 IMPLANT
GAUZE SPONGE 4X4 12PLY STRL LF (GAUZE/BANDAGES/DRESSINGS) ×1 IMPLANT
GLOVE SURG SS PI 8.0 STRL IVOR (GLOVE) ×4 IMPLANT
GOWN STRL REUS W/ TWL LRG LVL3 (GOWN DISPOSABLE) ×3 IMPLANT
GOWN STRL REUS W/ TWL XL LVL3 (GOWN DISPOSABLE) ×9 IMPLANT
GOWN STRL REUS W/TWL LRG LVL3 (GOWN DISPOSABLE) ×3
GOWN STRL REUS W/TWL XL LVL3 (GOWN DISPOSABLE) ×9
KIT BASIN OR (CUSTOM PROCEDURE TRAY) ×4 IMPLANT
KIT TURNOVER KIT B (KITS) ×4 IMPLANT
NS IRRIG 1000ML POUR BTL (IV SOLUTION) ×8 IMPLANT
PACK CV ACCESS (CUSTOM PROCEDURE TRAY) IMPLANT
PACK GENERAL/GYN (CUSTOM PROCEDURE TRAY) IMPLANT
PACK PERIPHERAL VASCULAR (CUSTOM PROCEDURE TRAY) IMPLANT
PACK UNIVERSAL I (CUSTOM PROCEDURE TRAY) IMPLANT
PAD ARMBOARD 7.5X6 YLW CONV (MISCELLANEOUS) ×8 IMPLANT
STAPLER VISISTAT 35W (STAPLE) ×1 IMPLANT
STRIP CLOSURE SKIN 1/2X4 (GAUZE/BANDAGES/DRESSINGS) ×4 IMPLANT
SUT MNCRL AB 4-0 PS2 18 (SUTURE) IMPLANT
SUT PDS AB 2-0 CT1 27 (SUTURE) ×1 IMPLANT
SUT PROLENE 5 0 C 1 24 (SUTURE) IMPLANT
SUT PROLENE 6 0 BV (SUTURE) IMPLANT
SUT VIC AB 2-0 CT1 27 (SUTURE) ×6
SUT VIC AB 2-0 CT1 TAPERPNT 27 (SUTURE) IMPLANT
SUT VIC AB 3-0 SH 27 (SUTURE) ×3
SUT VIC AB 3-0 SH 27X BRD (SUTURE) IMPLANT
TOWEL GREEN STERILE (TOWEL DISPOSABLE) ×4 IMPLANT
TRAY FOLEY MTR SLVR 16FR STAT (SET/KITS/TRAYS/PACK) IMPLANT
UNDERPAD 30X36 HEAVY ABSORB (UNDERPADS AND DIAPERS) ×4 IMPLANT
WATER STERILE IRR 1000ML POUR (IV SOLUTION) ×4 IMPLANT

## 2021-04-26 SURGICAL SUPPLY — 63 items
ADH SKN CLS APL DERMABOND .7 (GAUZE/BANDAGES/DRESSINGS) ×2
ARMBAND PINK RESTRICT EXTREMIT (MISCELLANEOUS) ×8 IMPLANT
BAG COUNTER SPONGE SURGICOUNT (BAG) ×4 IMPLANT
BAG DECANTER FOR FLEXI CONT (MISCELLANEOUS) ×4 IMPLANT
BAG SPNG CNTER NS LX DISP (BAG) ×2
BIOPATCH RED 1 DISK 7.0 (GAUZE/BANDAGES/DRESSINGS) ×4 IMPLANT
CANISTER SUCT 3000ML PPV (MISCELLANEOUS) ×4 IMPLANT
CATH EMB 4FR 40CM (CATHETERS) ×1 IMPLANT
CATH EMB 4FR 80CM (CATHETERS) ×4 IMPLANT
CATH PALINDROME-P 19CM W/VT (CATHETERS) IMPLANT
CATH PALINDROME-P 23CM W/VT (CATHETERS) IMPLANT
CATH PALINDROME-P 28CM W/VT (CATHETERS) IMPLANT
CLIP VESOCCLUDE MED 6/CT (CLIP) ×4 IMPLANT
CLIP VESOCCLUDE SM WIDE 6/CT (CLIP) ×4 IMPLANT
COVER PROBE W GEL 5X96 (DRAPES) ×5 IMPLANT
COVER SURGICAL LIGHT HANDLE (MISCELLANEOUS) ×4 IMPLANT
DERMABOND ADVANCED (GAUZE/BANDAGES/DRESSINGS) ×1
DERMABOND ADVANCED .7 DNX12 (GAUZE/BANDAGES/DRESSINGS) ×3 IMPLANT
DRAPE C-ARM 42X72 X-RAY (DRAPES) ×5 IMPLANT
DRAPE CHEST BREAST 15X10 FENES (DRAPES) ×4 IMPLANT
DRAPE ORTHO SPLIT 77X108 STRL (DRAPES) ×3
DRAPE SURG ORHT 6 SPLT 77X108 (DRAPES) IMPLANT
DRAPE X-RAY CASS 24X20 (DRAPES) IMPLANT
DRSG COVADERM 4X6 (GAUZE/BANDAGES/DRESSINGS) ×1 IMPLANT
ELECT REM PT RETURN 9FT ADLT (ELECTROSURGICAL) ×3
ELECTRODE REM PT RTRN 9FT ADLT (ELECTROSURGICAL) ×3 IMPLANT
GAUZE 4X4 16PLY ~~LOC~~+RFID DBL (SPONGE) ×4 IMPLANT
GLOVE SURG SS PI 7.5 STRL IVOR (GLOVE) ×12 IMPLANT
GOWN STRL REUS W/ TWL LRG LVL3 (GOWN DISPOSABLE) ×6 IMPLANT
GOWN STRL REUS W/ TWL XL LVL3 (GOWN DISPOSABLE) ×3 IMPLANT
GOWN STRL REUS W/TWL LRG LVL3 (GOWN DISPOSABLE) ×6
GOWN STRL REUS W/TWL XL LVL3 (GOWN DISPOSABLE) ×3
HEMOSTAT SNOW SURGICEL 2X4 (HEMOSTASIS) IMPLANT
KIT BASIN OR (CUSTOM PROCEDURE TRAY) ×4 IMPLANT
KIT PALINDROME-P 55CM (CATHETERS) IMPLANT
KIT TURNOVER KIT B (KITS) ×4 IMPLANT
NDL 18GX1X1/2 (RX/OR ONLY) (NEEDLE) ×3 IMPLANT
NDL HYPO 25GX1X1/2 BEV (NEEDLE) ×3 IMPLANT
NEEDLE 18GX1X1/2 (RX/OR ONLY) (NEEDLE) ×3 IMPLANT
NEEDLE HYPO 25GX1X1/2 BEV (NEEDLE) ×3 IMPLANT
NS IRRIG 1000ML POUR BTL (IV SOLUTION) ×4 IMPLANT
PACK CV ACCESS (CUSTOM PROCEDURE TRAY) ×4 IMPLANT
PACK SURGICAL SETUP 50X90 (CUSTOM PROCEDURE TRAY) ×4 IMPLANT
PAD ARMBOARD 7.5X6 YLW CONV (MISCELLANEOUS) ×8 IMPLANT
SET COLLECT BLD 21X3/4 12 (NEEDLE) IMPLANT
SOAP 2 % CHG 4 OZ (WOUND CARE) ×4 IMPLANT
STOPCOCK 4 WAY LG BORE MALE ST (IV SETS) ×2 IMPLANT
SUT ETHILON 3 0 PS 1 (SUTURE) ×5 IMPLANT
SUT PROLENE 6 0 BV (SUTURE) ×4 IMPLANT
SUT PROLENE 6 0 CC (SUTURE) ×1 IMPLANT
SUT VIC AB 3-0 SH 27 (SUTURE) ×3
SUT VIC AB 3-0 SH 27X BRD (SUTURE) ×3 IMPLANT
SUT VICRYL 4-0 PS2 18IN ABS (SUTURE) ×4 IMPLANT
SYR 10ML LL (SYRINGE) ×6 IMPLANT
SYR 20ML LL LF (SYRINGE) ×8 IMPLANT
SYR 5ML LL (SYRINGE) ×4 IMPLANT
SYR CONTROL 10ML LL (SYRINGE) ×4 IMPLANT
TOWEL GREEN STERILE (TOWEL DISPOSABLE) ×8 IMPLANT
TOWEL GREEN STERILE FF (TOWEL DISPOSABLE) ×4 IMPLANT
TUBING EXTENTION W/L.L. (IV SETS) IMPLANT
UNDERPAD 30X36 HEAVY ABSORB (UNDERPADS AND DIAPERS) ×4 IMPLANT
WATER STERILE IRR 1000ML POUR (IV SOLUTION) ×4 IMPLANT
WIRE STARTER BENTSON 035X150 (WIRE) ×1 IMPLANT

## 2021-04-26 NOTE — Progress Notes (Signed)
Received from OR s/p incision and drainage of post operative hematoma. Intubated with precedex drip infusing.Dressing in place in L upper arm with penrose drain in place. VSS. Sats 94% on the monitor.Not in respiratory distress.Will monitor closely. ?

## 2021-04-26 NOTE — Procedures (Signed)
Cortrak ? ?Person Inserting Tube:  Esaw Dace, [place] ?Tube Type:  Cortrak - 43 inches ?Tube Size:  10 ?Tube Location:  Left nare ?Initial Placement:  Stomach ?Secured by: Dorann Lodge ?Technique Used to Measure Tube Placement:  Marking at nare/corner of mouth ?Cortrak Secured At:  61 cm ? ?Cortrak Tube Team Note: ? ?Consult received to place a Cortrak feeding tube.  ? ?X-ray is required, abdominal x-ray has been ordered by the Cortrak team. Please confirm tube placement before using the Cortrak tube.  ? ?If the tube becomes dislodged please keep the tube and contact the Cortrak team at [email or web address] (password [email or web address]) for replacement.  ?If after hours and replacement cannot be delayed, place a NG tube and confirm placement with an abdominal x-ray.  ? ? ?Kerman Passey MS, RDN, LDN, CNSC ?Registered Dietitian III ?Clinical Nutrition ?RD Pager and On-Call Pager Number Located in [place]  ? ? ?

## 2021-04-26 NOTE — Transfer of Care (Signed)
Immediate Anesthesia Transfer of Care Note ? ?Patient: Ann Bryan ? ?Procedure(s) Performed: INSERTION OF DIALYSIS CATHETER (Right: Neck) ?THROMBECTOMY LEFT ARM ARTERIOVENOUS FISTULA (Left: Arm Upper) ? ?Patient Location: PACU ? ?Anesthesia Type:General ? ?Level of Consciousness: Patient remains intubated per anesthesia plan ? ?Airway & Oxygen Therapy: Patient remains intubated per anesthesia plan ? ?Post-op Assessment: Report given to RN and Post -op Vital signs reviewed and stable ? ?Post vital signs: Reviewed and stable ? ?Last Vitals:  ?Vitals Value Taken Time  ?BP 199/86 05/05/2021 0914  ?Temp    ?Pulse 85 04/15/2021 0934  ?Resp 0 04/09/2021 0934  ?SpO2 100 % 04/30/2021 0934  ?Vitals shown include unvalidated device data. ? ?Last Pain:  ?Vitals:  ? 04/08/2021 0330  ?TempSrc: Oral  ?PainSc:   ?   ? ?Patients Stated Pain Goal: 0 (04/22/2021 4076) ? ?Complications: No notable events documented. ?

## 2021-04-26 NOTE — Progress Notes (Signed)
? [facility] ?Progress Note  ? ?Subjective:  got poor HD yest (low flows) due to [facility] malfunction. HD nurse stuck the AVF and got old blood returned, not usable. Today she is going back to OR w/ VVS for access management.  ? ?Objective ?Vitals:  ? 05/04/2021 0500 04/17/2021 0600 04/10/2021 0700 04/17/2021 0730  ?BP: 133/73 (!) 146/74 (!) 153/84 (!) 168/86  ?Pulse: 74 77 78 83  ?Resp: 19 19 (!) 0 18  ?Temp:      ?TempSrc:      ?SpO2: 100% 100% 100% 100%  ?Weight:      ?Height:      ? ?Physical Exam ?Gen on vent, sedated ?Sclera anicteric, throat w/ ETT ?No jvd or bruits ?Chest clear anterior/ lateral ?RRR no RG ?Abd soft ntnd no mass or ascites +bs ?Ext no LE or UE edema ?Neuro is on vent, sedated ?  LUE AVF+bruit ?Dialysis Orders: ?Prev on CCPD - managed by Dr. Joelyn Oms ?Now transitioning to HD d/t PD failure - establishing orders. ?- 4/17 hep B Ag was neg and hep B Abs were > 10 (= protective)  ?  ?Assessment/Plan: ?Hypotension - during HD access surgery, appears to have resolved.  ?VDRF - per CCM ?Fall/AMS/weakness: resolved, was admit issue. Multifactorial with new/severe HF + uremia + medications.  ?Severe systolic HF: New Dx, d/t #3, echo 4/11 with EF 21% ?Severe 3V CAD/NSTEMI: New Dx, LHC 4/14 without targets for intervention ?Bursts of SVT: On amiodarone, per cardiology. ?ESRD: was on CCPD, failing modality, ^ BUN, alb < 1.5 and progressive debility. Would need SNF but SNF's do not take PD pts.  Pt had to transition to HD. Doing HD here TTS for now. Needs CLIP. Poor HD yest, plan HD attempt again today.  ?Dialysis access: IR placed [facility] 04/22/21. Renal SW working on HD placement. VVS revised pt's AVF 4/19. Will need PD cath removed at some point, likely as outpatient. Having access issues - VVS taking back to OR for [facility] and AVF assessment this am.  ?HTN/volume: BP stable at this time, no LE edema. Wt's down sig, may be dry. No UF w/ HD again today.  ?MBD ckd: cont rocaltrol. Given severe arterial Ca++  have changed to renvela as nonCa++ binder.  ?Anemia of ESRD: Hgb > 12, no ESA needed ?Nutrition: Alb < 1.5, adding supplements ?Chronic pain (legs): d/t ? fall v. neuropathy v. PAD ?GOC: will likely need SNF placement. ? ?Kelly Splinter, MD ?04/17/2021, 9:34 AM ? ?Recent Labs  ?Lab 05/04/2021 ?0454 04/30/2021 ?1814 04/29/2021 ?1945 04/25/21 ?1100 04/25/21 ?1332  ?HGB  --   --  12.2 12.5  --   ?ALBUMIN <1.5*  --   --   --  <1.5*  ?CALCIUM 8.1* 8.3*  --   --  7.1*  ?PHOS 5.8*  --   --   --  3.9  ?CREATININE 7.06* 7.90*  --   --  5.36*  ?K 4.7 5.0 4.9  --  3.6  ? ? ? ? ?Medications: ? sodium chloride    ? dexmedetomidine (PRECEDEX) IV infusion 0.4 mcg/kg/hr (04/14/2021 6144)  ? norepinephrine (LEVOPHED) Adult infusion Stopped (04/19/2021 2145)  ? propofol (DIPRIVAN) infusion Stopped (04/25/21 3154)  ? ? acetaminophen (TYLENOL) oral liquid 160 mg/5 mL  325 mg Per Tube Q6H  ? amiodarone  200 mg Per Tube BID  ? aspirin  81 mg Per Tube Daily  ? atorvastatin  80 mg Per Tube Daily  ? brimonidine  1 drop Left Eye BID  ? [  START ON 04/27/2021] calcitRIOL  1 mcg Per Tube Once per day on Tue Thu Sat  ? colchicine  0.3 mg Per Tube Daily  ? dextrose  1 ampule Intravenous Once  ? docusate  100 mg Per Tube BID  ? heparin  5,000 Units Subcutaneous Q8H  ? insulin aspart  0-6 Units Subcutaneous Q4H  ? loratadine  10 mg Per Tube Daily  ? mouth rinse  15 mL Mouth Rinse 10 times per day  ? multivitamin  1 tablet Per Tube QHS  ? nystatin   Topical TID  ? pantoprazole sodium  40 mg Per Tube Daily  ? polyethylene glycol  17 g Per Tube Daily  ? sevelamer carbonate  2,400 mg Per Tube TID WC  ? ? ? ? ?

## 2021-04-26 NOTE — Progress Notes (Signed)
eLink Physician-Brief Progress Note ?Patient Name: Ann Bryan ?DOB: 02/17/1960 ?MRN: [id number] ? ? ?Date of Service ? 04/17/2021  ?HPI/Events of Note ? MRI requesting pelvic X-ray to r/o MRI incompatible implants or foreign bodies.  ?eICU Interventions ? Pelvic X-ray ordered.  ? ? ? ?  ? ?[name] ?04/27/2021, 1:36 AM ?

## 2021-04-26 NOTE — Progress Notes (Signed)
PT Cancellation Note ? ?Patient Details ?Name: Ann Bryan ?MRN: [id number] ?DOB: 08/16/1960 ? ? ?Cancelled Treatment:    Reason Eval/Treat Not Completed: Patient at procedure or test/unavailable. Will plan to follow-up later as time permits. ? ? ?Ann Bryan, PT, DPT ?Acute Rehabilitation Services  ?Pager: [phone] ?Office: [phone] ? ? ? ?Ann Bryan ?04/17/2021, 7:56 AM ? ? ?

## 2021-04-26 NOTE — Progress Notes (Signed)
? ?  Palliative Medicine Inpatient Follow Up Note ? ? ?HPI: ?60-year-old with a history of DM2, HTN, HLD, diabetic neuropathy, and ESRD on PD who presented to the ED with severe generalized weakness for approximately 6 to 7 days and an eventual pre-syncopal spell resulting in a fall without loss of consciousness.  After her fall she was too weak to get up and spent the night on the floor, thusly missing her PD.  A neighbor called EMS who arrived to find the patient on the floor with stable vital signs and a normal CBG.  In the ED the patient was hypothermic with temperature 95.4.  WBC was borderline at 10. ?  ?Palliative care has been asked to get involved to discuss goals of care in the setting of multiple co-morbidities.  ? ?Today's Discussion (04/24/2021): ? ?*Please note that this is a verbal dictation therefore any spelling or grammatical errors are due to the "Dragon Medical One" system interpretation. ? ?Chart reviewed inclusive of vital signs, progress notes, laboratory results, and diagnostic images.  ? ?I met with Ann Bryan at bedside this afternoon. She is intubated and sedated. Per conversation with patients RN, [name] she got agitated therefore was started on precedex. She then had bradycardia and a drop in her BPs so the rate was decreased. Upon exam Ann Bryan is not responsive.  ? ?I spoke to patients niece, Ann Bryan. We reviewed that presently the plan will be for Ann Bryan to get HD. Discussed her agitation. Ann Bryan is inquiring as to when her ETT will be removed. I shared that this decision is up to the intensivist team. We reviewed the hope for Ann Bryan's mental state to clear. I shared in the event that it doesn't we'd need to discuss what to do in terms of how long to prolong artificial measures. Ann Bryan is well aware of these potential future decisions.  ? ?Questions and concerns addressed  ? ?Palliative Support Provided ? ?Objective Assessment: ?Vital Signs ?Vitals:  ? 04/12/2021 1230 05/05/2021 1300  ?BP: 107/68 98/63   ?Pulse: 63 61  ?Resp: 17 14  ?Temp:    ?SpO2: 100% 100%  ? ? ?Intake/Output Summary (Last 24 hours) at 04/27/2021 1434 ?Last data filed at 04/10/2021 1300 ?Gross per 24 hour  ?Intake 807.39 ml  ?Output 940 ml  ?Net -132.61 ml  ? ? ?Last Weight  Most recent update: 04/21/2021  3:44 AM  ? ? Weight  ?58.8 kg (129 lb 10.1 oz)  ?      ? ?  ? ?Gen:  Older AA F chronically ill in appearance ?HEENT: ETT, coretrack, dry mucous membranes ?CV: regular rate and irregular rhythm ?PULM: On ventilator ?ABD: soft/nontender ?EXT: No edema  ?Neuro: Disoriented ? ?SUMMARY OF RECOMMENDATIONS   ?DNAR/DNI ? ?Patients niece curious to see if patients cognitive state will improve once she gets HD, per nursing plan is for hemodialysis  ? ?Ongoing PMT support ? ?MDM - High ?______________________________________________________________________________________ ?Michelle Ferolito ? Palliative Medicine Team ?Team Cell Phone: [phone] ?Please utilize secure chat with additional questions, if there is no response within 30 minutes please call the above phone number ? ?Palliative Medicine Team providers are available by phone from 7am to 7pm daily and can be reached through the team cell phone.  ?Should this patient require assistance outside of these hours, please call the patient's attending physician. ? ? ? ? ?

## 2021-04-26 NOTE — Progress Notes (Signed)
PCCM: ? ?I called and spoke with the patients niece regarding her current situation. All updates were given and she was appreciative.  ? ?Garner Nash, DO ?[facility] ?04/27/2021 3:55 PM   ? ?

## 2021-04-26 NOTE — Progress Notes (Signed)
OR Staff came to take pt. to OR during morning assessment. OR staff notified of 0735 BG of 49 and need to be treated, OR staff indicated they would address it. Also advised pt. has chlorhexidine allergy. Report given, all questions addressed.  ?

## 2021-04-26 NOTE — TOC Progression Note (Signed)
Transition of Care (TOC) - Progression Note  ? ? ?Patient Details  ?Name: Ann Bryan ?MRN: [id number] ?Date of Birth: [birth year] ? ?Transition of Care (TOC) CM/SW Contact  ?Milas Gain, LCSWA ?Phone Number: ?04/17/2021, 1:19 PM ? ?Clinical Narrative:    ? ?TOC will continue to follow and assist with patients dc planning needs. Patient currently on vent and has cortrak. CSW called patients niece to discuss status of SNF placement,no answer. CSW awaiting callback. CSW will continue to follow. ? ? ?Expected Discharge Plan: [place] ?Barriers to Discharge: Continued Medical Work up ? ?Expected Discharge Plan and Services ?Expected Discharge Plan: [place] ?In-house Referral: Clinical Social Work ?Discharge Planning Services: CM Consult ?Post Acute Care Choice: [place] ?Living arrangements for the past 2 months: Apartment ?                ?  ?  ?  ?  ?  ?  ?  ?  ?  ?  ? ? ?Social Determinants of Health (SDOH) Interventions ?  ? ?Readmission Risk Interventions ?   ? View : No data to display.  ?  ?  ?  ? ? ?

## 2021-04-26 NOTE — Progress Notes (Signed)
Nutrition Follow-up ? ?DOCUMENTATION CODES:  ? ?Non-severe (moderate) malnutrition in context of chronic illness (may possibly be severe, reassess on follow) ? ?INTERVENTION:  ? ?Tube Feeding via Cortrak: ?Vital 1.5 at 45 ml/hr ?Pro-Source TF 90 mL BID ?This goal rate provides 1780 kcals, 117 g of protein and 821 mL of free water ? ?Recommend holding binder therapy at present while NPO ? ?Given constipation with moderate stool burden on abd xray, recommend adjusting bowel regimen ? ?Renal MVI daily via cortrak tube  ? ?NUTRITION DIAGNOSIS:  ? ?Moderate Malnutrition related to chronic illness (ESRD on PD/HD) as evidenced by severe fat depletion, moderate muscle depletion, moderate fat depletion. ?Ongoing.  ? ?GOAL:  ? ?Patient will meet greater than or equal to 90% of their needs ?Met with TF at goal  ? ?MONITOR:  ? ?Vent status, Labs, Weight trends, TF tolerance ? ?REASON FOR ASSESSMENT:  ? ?Ventilator ?  ? ?ASSESSMENT:  ? ?76 you female admitted with generalized weakness and syncope and found to have NSTEMI. Pt previous on PD for ESRD but transitioned to iHD this admission due to concern for poor dialysis, poor compliance FTT. After revision of AV fistula, pt with possible arrest, intubated; now with acute encephalopathy. PMH includes ESRD on PD, DM, depression, HLD, HTN ?  ?4/11 Admitted ?4/16 IF consulted for HD cath placement ?4/19 OR for revision of AV fistula, developed bradycardia and hypotension, intubated, near/brief arrest ?4/21 s/p cortrak placement; tip in stomach per xray  ? ?Pt remains on vent support, to restart HD to see if this helps mental status clear. Palliative following.  ? ?EDW 62 kg; currently under dry weight ? ?Constipated, no BM since 4/11. Moderate stool burden in colon per abd xray ? ? ?Labs: albumin <1.5, corrected calcium >/= 6.4, potassium 3.6 (wdl), phosphorus 6.3 ?CBG's: 186-208 ? ?Meds: ss novolog, semglee, miralax, colace, renvela, calcitriol, rena-vit  ?Precedex  ? ? ?Diet Order:    ?Diet Order   ? ?       ?  Diet NPO time specified Except for: Sips with Meds  Diet effective midnight       ?  ? ?  ?  ? ?  ? ? ?EDUCATION NEEDS:  ? ?Not appropriate for education at this time ? ?Skin:  Skin Assessment: Reviewed RN Assessment ? ?Last BM:  4/11 ? ?Height:  ? ?Ht Readings from Last 1 Encounters:  ?04/12/2021 5' (1.524 m)  ? ? ?Weight:  ? ?Wt Readings from Last 1 Encounters:  ?05/02/2021 58.8 kg  ? ? ? ?BMI:  Body mass index is 25.32 kg/m?. ? ?Estimated Nutritional Needs:  ? ?Kcal:  1700-1900 kcals ? ?Protein:  105-125 g ? ?Fluid:  1000 mL plus UOP ? ?Lockie Pares., RD, LDN, CNSC ?See AMiON for contact information  ? ? ?

## 2021-04-26 NOTE — Op Note (Signed)
? ? ?  Patient name: Ann Bryan MRN: [id number] DOB: Sep 07, 1960 Sex: female ? ?04/25/2021 ?Pre-operative Diagnosis: ESRD ?Post-operative diagnosis:  Same ?Surgeon:  [name] ?Assistants:  Ivin Booty, PA ?Procedure:   #1:  thrombectomy of left arm fistula ?  #2:  manipulation of dialysis catheter using flouroscopic imaging ?Anesthesia:  General ?Blood Loss:  minimal ?Specimens:  none ? ?Findings:  catheter flushed and aspirated without difficulty.  A 035 wire was passed through each port with no resistance.  Simple thrombectomy of the fistula was performed ? ?Indications:  this is a 61 year old female who is s/p left cephalic vein turndown.  During the procedure she had hemodynamic issues requiring intubation.  She was very hypotensive and was taken back to the ICU intubated.  Her fistula occluded.  She is also having trouble with her catheter.  Consent was obtained from her family member. ? ?Procedure:  The patient was identified in the holding area and taken to [facility] 12  The patient was then placed supine on the table. general anesthesia was administered.  The patient was prepped and draped in the usual sterile fashion.  A time out was called and antibiotics were administered.  A PA was necessary to explant procedure and assist with technical details. ? ?I first began with her catheter.  I removed the tPA from the catheter.  I then was able to easily aspirate from both ports.  Both ports flushed without difficulty.  I then inserted a 035 Bentson wire through both ports under fluoroscopic visualization.  This easily exited the catheter with no resistance.  I then confirmed with fluoroscopy that there were no kinks within the catheter.  The catheter was then sutured into position. ? ?Next attention was turned towards the left upper arm.  I opened her previous incision and then bluntly exposed her fistula.  There was a fair amount of hematoma that was evacuated.  I then gave 3000 units of heparin.  A #11 blade  was used to make a transverse venotomy.  There was thrombus within the fistula.  I used a Transport planner and passed this centrally.  No significant thrombus was evacuated.  Several passes were performed and then the fistula was flushed with heparin saline.  I then directed the catheter towards the arterial anastomosis at the antecubital crease.  I performed thrombectomy and evacuated significant thrombus.  The PA held pressure on the fistula for hemostasis during the passes with a Fogarty.  Several passes were performed and I ultimately remove the arterial plug as well as some chronic appearing thrombus.  Once this was performed, there was excellent pulsatile bleeding from the fistula.  The fistula was then reoccluded with heparin saline and a baby carrier clamp.  I closed the venotomy transversely with running 6-0 Prolene.  Once this was completed, the clamps were released.  There was an excellent thrill within the fistula.  I then irrigated the wound and removed all old thrombus.  Hemostasis was achieved.  The incision was closed with 2 layers of Vicryl followed by Dermabond.  There were no immediate complications ? ? ?Disposition: Return to ICU intubated.  Stable condition ? ? ?V. [name], M.D., FACS ?[facility] ?Office: [phone] ?Pager:  [phone]  ?

## 2021-04-26 NOTE — Anesthesia Preprocedure Evaluation (Addendum)
Anesthesia Evaluation  ?Patient identified by MRN, date of birth, ID band ?Patient unresponsive ? ? ? ?Reviewed: ?Allergy & Precautions, NPO status , Patient's Chart, lab work & pertinent test results, Unable to perform ROS - Chart review only ? ?History of Anesthesia Complications ?(+) history of anesthetic complications ? ?Airway ?Mallampati: Intubated ? ? ? ? ? ? Dental ?no notable dental hx. ? ?  ?Pulmonary ? ?  ? ?+ decreased breath sounds ? ? ? ? ? Cardiovascular ?hypertension, Pt. on medications and Pt. on home beta blockers ?+ CAD, + Past MI and +CHF  ? ?Rhythm:Regular Rate:Normal ? ??1. Left ventricular ejection fraction by 3D volume is 21 %. The left  ?ventricle has severely decreased function. The left ventricle demonstrates  ?global hypokinesis. Left ventricular diastolic parameters are consistent  ?with Grade I diastolic dysfunction  ?(impaired relaxation). Elevated left ventricular end-diastolic pressure.  ??2. Right ventricular systolic function is moderately reduced. The right  ?ventricular size is mildly enlarged. There is normal pulmonary artery  ?systolic pressure.  ??3. Left atrial size was mildly dilated.  ??4. There is small to moderate circumferential pericardial effusion. There  ?is no evidence of cardiac tamponade.  ??5. The mitral valve is normal in structure. Mild mitral valve  ?regurgitation. No evidence of mitral stenosis.  ??6. The aortic valve is normal in structure. Aortic valve regurgitation is  ?not visualized. No aortic stenosis is present.  ??7. The inferior vena cava is normal in size with greater than 50%  ?respiratory variability, suggesting right atrial pressure of 3 mmHg.  ?  ?Neuro/Psych ?PSYCHIATRIC DISORDERS Depression  Neuromuscular disease   ? GI/Hepatic ?negative GI ROS, Neg liver ROS,   ?Endo/Other  ?diabetes, Type 2, Insulin Dependent ? Renal/GU ?ESRF and DialysisRenal disease  ? ?  ?Musculoskeletal ?negative musculoskeletal ROS ?(+)   ? Abdominal ?Normal abdominal exam  (+)   ?Peds ? Hematology ? ?(+) Blood dyscrasia, anemia , Left arm fistula   ?Anesthesia Other Findings ? ? Reproductive/Obstetrics ? ?  ? ? ? ? ? ? ? ? ? ? ? ? ? ?  ?  ? ? ? ? ? ? ? ?Anesthesia Physical ?Anesthesia Plan ? ?ASA: 4 ? ?Anesthesia Plan: General  ? ?Post-op Pain Management:   ? ?Induction: Intravenous ? ?PONV Risk Score and Plan: 3 ? ?Airway Management Planned: Oral ETT ? ?Additional Equipment:  ? ?Intra-op Plan:  ? ?Post-operative Plan: Post-operative intubation/ventilation ? ?Informed Consent:  ? ? ? ?History available from chart only ? ?Plan Discussed with: CRNA ? ?Anesthesia Plan Comments:   ? ? ? ? ? ? ?Anesthesia Quick Evaluation ? ?

## 2021-04-26 NOTE — Anesthesia Preprocedure Evaluation (Addendum)
Anesthesia Evaluation  ?Patient identified by MRN, date of birth, ID band ?Patient unresponsive ? ? ? ?Reviewed: ?Allergy & Precautions, NPO status , Patient's Chart, lab work & pertinent test results ? ?Airway ?Mallampati: Intubated ? ? ? ? ? ? Dental ?no notable dental hx. ? ?  ?Pulmonary ?neg pulmonary ROS,  ?  ? ?+ decreased breath sounds ? ? ? ? ? Cardiovascular ?hypertension, Pt. on medications and Pt. on home beta blockers ?+ CAD, + Past MI and +CHF  ? ?Rhythm:Regular Rate:Normal ? ? ?  ?Neuro/Psych ?Depression negative neurological ROS ?   ? GI/Hepatic ?negative GI ROS, Neg liver ROS,   ?Endo/Other  ?diabetes, Type 2, Insulin Dependent ? Renal/GU ?ESRF and DialysisRenal disease  ?negative genitourinary ?  ?Musculoskeletal ?negative musculoskeletal ROS ?(+)  ? Abdominal ?Normal abdominal exam  (+)   ?Peds ? Hematology ? ?(+) Blood dyscrasia, anemia , Left arm fistula   ?Anesthesia Other Findings ? ? Reproductive/Obstetrics ? ?  ? ? ? ? ? ? ? ? ? ? ? ? ? ?  ?  ? ? ? ? ? ? ? ?Anesthesia Physical ?Anesthesia Plan ? ?ASA: 4 ? ?Anesthesia Plan: General  ? ?Post-op Pain Management:   ? ?Induction: Intravenous ? ?PONV Risk Score and Plan: 3 ? ?Airway Management Planned: Oral ETT ? ?Additional Equipment: None ? ?Intra-op Plan:  ? ?Post-operative Plan: Post-operative intubation/ventilation ? ?Informed Consent:  ? ? ? ?Consent reviewed with POA and History available from chart only ? ?Plan Discussed with: CRNA ? ?Anesthesia Plan Comments: (Cath 04/23: ?- Probable mixed ischemic and nonischemic systolic heart failure, compensated with LVEDP normal but with elevated capillary wedge pressure. ?- Hemodynamics/cardiac output suggest cardiogenic shock although measurements were made in the setting of intermittent supraventricular tachycardia at which time systolic blood pressures would be less than 80 mmHg. ?- Three-vessel coronary artery disease with 75% mid LAD, 99% mid third obtuse  marginal, total occlusion of the circumflex beyond the third obtuse marginal, total occlusion of the dominant right coronary proximally.  There are left-to-right collaterals. ? ?ECHO 04/23: ??1. Left ventricular ejection fraction by 3D volume is 21 %. The left  ?ventricle has severely decreased function. The left ventricle demonstrates  ?global hypokinesis. Left ventricular diastolic parameters are consistent  ?with Grade I diastolic dysfunction  ?(impaired relaxation). Elevated left ventricular end-diastolic pressure.  ??2. Right ventricular systolic function is moderately reduced. The right  ?ventricular size is mildly enlarged. There is normal pulmonary artery  ?systolic pressure.  ??3. Left atrial size was mildly dilated.  ??4. There is small to moderate circumferential pericardial effusion. There  ?is no evidence of cardiac tamponade.  ??5. The mitral valve is normal in structure. Mild mitral valve  ?regurgitation. No evidence of mitral stenosis.  ??6. The aortic valve is normal in structure. Aortic valve regurgitation is  ?not visualized. No aortic stenosis is present.  ??7. The inferior vena cava is normal in size with greater than 50%  ?respiratory variability, suggesting right atrial pressure of 3 mmHg.  ? ? ?)  ? ? ? ? ? ?Anesthesia Quick Evaluation ? ?

## 2021-04-26 NOTE — Transfer of Care (Signed)
Immediate Anesthesia Transfer of Care Note ? ?Patient: Ann Bryan ? ?Procedure(s) Performed: EVACUATION OF LEFT SHOULDER HEMATOMA (Left: Arm Upper) ?CONTINUOUS AMBULATORY PERITONEAL DIALYSIS (CAPD) CATHETER REMOVAL (Right: Abdomen) ? ?Patient Location: ICU ? ?Anesthesia Type:General ? ?Level of Consciousness: Patient remains intubated per anesthesia plan ? ?Airway & Oxygen Therapy: Patient remains intubated per anesthesia plan and Patient placed on Ventilator (see vital sign flow sheet for setting) ? ?Post-op Assessment: Report given to RN and Post -op Vital signs reviewed and stable ? ?Post vital signs: Reviewed and stable ? ?Last Vitals:  ?Vitals Value Taken Time  ?BP 115/66 04/09/2021 2036  ?Temp    ?Pulse 65 05/01/2021 2039  ?Resp 18 04/13/2021 2039  ?SpO2 100 % 04/22/2021 2039  ?Vitals shown include unvalidated device data. ? ?Last Pain:  ?Vitals:  ? 04/22/2021 1631  ?TempSrc: Oral  ?PainSc:   ?   ? ?Patients Stated Pain Goal: 0 (05/02/2021 9604) ? ?Complications: No notable events documented. ?

## 2021-04-26 NOTE — Op Note (Addendum)
DATE OF SERVICE: 05/01/2021 ? ?PATIENT:  Ann Bryan  61 y.o. female ? ?PRE-OPERATIVE DIAGNOSIS:  postoperative hematoma. Poorly functioning PD catheter ? ?POST-OPERATIVE DIAGNOSIS:  Same ? ?PROCEDURE:   ?1) incision and drainage of postoperative hematoma about left shoulder ?2) removal of peritoneal dialysis catheter ? ?SURGEON:  Surgeon(s) and Role: ?   * Cherre Robins, MD - Primary ? ?ASSISTANT: Paulo Fruit, PA-C ? ?An experienced assistant was required given the complexity of this procedure and the standard of surgical care. My assistant helped with exposure through counter tension, suctioning, ligation and retraction to better visualize the surgical field.  My assistant expedited sewing during the case by following my sutures. Wherever I use the term "we" in the report, my assistant actively helped me with that portion of the procedure. ? ?ANESTHESIA:   general ? ?EBL: minimal ? ?BLOOD ADMINISTERED:none ? ?DRAINS: none  ? ?LOCAL MEDICATIONS USED:  NONE ? ?SPECIMEN:  none ? ?COUNTS: confirmed correct. ? ?TOURNIQUET:  none ? ?PATIENT DISPOSITION:  PACU - hemodynamically stable. ?  ?Delay start of Pharmacological VTE agent (>24hrs) due to surgical blood loss or risk of bleeding: no ? ?INDICATION FOR PROCEDURE: Ann Bryan is a 61 y.o. female with hematoma after thrombectomy of left arm cephalic vein turndown. She also has a PD catheter which is non-functional. After careful discussion of risks, benefits, and alternatives the patient was offered incision and drainage and removal of PD catheter. The patient's family understood and wished to proceed. ? ?OPERATIVE FINDINGS: hematoma evacuated. No active bleeding identified. Unremarkable excision of PD catheter. ? ?DESCRIPTION OF PROCEDURE: After identification of the patient in the pre-operative holding area, the patient was transferred to the operating room. The patient was positioned supine on the operating room table. Anesthesia was induced. The left shoulder  and abdomen were prepped and draped in standard fashion. A surgical pause was performed confirming correct patient, procedure, and operative location. ? ?The incision over the left shoulder was reopened with Metzenbaum scissors.  Hematoma was evacuated manually.  The wound was thoroughly inspected.  There is no evidence of active bleeding.  The fistula and anastomosis was carefully inspected.  These were provoked.  There was no active bleeding.  The wound was copiously irrigated.  The wound was closed in layers using 2-0 Vicryl, 3-0 Vicryl, 4 skin staples.  A Penrose drain was left in place to allow drainage of the potential space. ? ?Intraoperative ultrasound was used to mark the course of the peritoneal dialysis catheter.  There was an infraumbilical scar with 2 cuffs visualized.  I reopened the scar sharply immediately below the umbilicus and identified one cuff.  This was freed from the surrounding subcutaneous tissue.  The intraperitoneal portion of the catheter was then removed intact.  Next we dissected around the catheter into the right hemiabdomen subcutaneous space.  An additional cuff was identified and dissected free from the subcutaneous tissue.  The catheter was freed.  I cut the catheter in half and removed both pieces intact.  The fascial defect was closed with 3-0 PDS.  The wound was closed using 2-0 Vicryl, 3-0 Vicryl, 4-0 Monocryl. ? ?Upon completion of the case instrument and sharps counts were confirmed correct. The patient was transferred to the PACU in good condition. I was present for all portions of the procedure. ? ?[name]. Stanford Breed, MD ?[facility] ?Office Phone Number: [phone] ?04/30/2021 8:38 PM ? ? ? ?

## 2021-04-26 NOTE — Progress Notes (Signed)
Aware out-pt HD placement needed. Will assist  once snf at d/c is known and pt more medically stable. Will follow and assist as needed.  ? ?Melven Sartorius ?Renal Navigator ?[phone] ? ? ?

## 2021-04-26 NOTE — Progress Notes (Signed)
Interventional Radiology Brief Note: ? ?IR consulted for HD cath exchange.  Vascular has taken to the OR this AM for fistulagram and HD cath placement.  Will cancel order for IR.  Please re-consult if needed. ? ?Brynda Greathouse, MS RD PA-C ? ? ?

## 2021-04-26 NOTE — Progress Notes (Signed)
? ?NAME:  Ann Bryan, MRN:  [id number], DOB:  [birth year], LOS: 10 ?ADMISSION DATE:  04/30/2021, CONSULTATION DATE:  4/19 ?REFERRING MD:  Dr. Trula Slade, CHIEF COMPLAINT:  bradycardia; intubated for   ? ?History of Present Illness:  ?This is a 61 year old female with past medical history of DM, HTN, HLD, neuropathy, ESRD on PD.  She presented to [facility] ED 05/01/2021 with generalized weakness and syncope. ?  ?In ED, she was found to have NSTEMI.  Her syncopal episode was felt to be due to hypoglycemia after her insulin regimen was adjusted recently before this episode. ?  ?She was admitted by [facility].  She was seen in consultation by cardiology after she was found to have new systolic CHF and possible cardiogenic shock in the setting of NSTEMI.  Right left heart cath showed severe three-vessel CAD for which PCI was not felt to be a great option nor was mechanical support.  It was recommended to manage NSTEMI medically though very little room for GDMT. ?  ?Palliative care was consulted 4/15 due to her multiple comorbidities and limited options.  After discussions, patient requested to be DNR. ?  ?IR consulted 4/16 for tunneled HD cath placement as it was felt that she was not tolerating PD well at home.  4/17, vascular surgery consulted for evaluation of new access either AVF versus AVG for long-term HD access. ?  ?4/19, she was taken to the operating room for new AVF placement.  Either peri or postop, she developed bradycardia and hypotension.  She was subsequently intubated due to concerns for near arrest.  Started on levo/neo for hypotension. No compressions. Unknown if patient lost pulses. PCCM was called in consultation and for assistance in the ICU. ? ?Pertinent  Medical History  ? ?Past Medical History:  ?Diagnosis Date  ? Anemia   ? Cataract   ? right eye  ? Complication of anesthesia   ?  " I woke up itching from anesthesia many years ago."   ? Depression   ? at times  ? Diabetes mellitus 1986  ?  poorly controlled last  hemoglobin A1c  10.5 on January 01, 2010, increase since June 2011 when hemoglobin A1c was 9.3 , determined to be most likely secondary to non-compliance.  ? Diabetic nephropathy ([place])   ? Diabetic retinopathy   ? Dyspnea   ? "when I have too much fluid"  ? End stage renal disease on dialysis [facility])   ? T/Th/Sat [address]  ? Glaucoma   ? History of blood transfusion   ? Hyperlipidemia   ? Hypertension   ? Nausea and vomiting   ? Neuropathy   ? Retinopathy   ? Wears glasses   ? ? ? ?Significant Hospital Events: ?Including procedures, antibiotic start and stop dates in addition to other pertinent events   ?4/11: admitted w/ syncope ?4/19: went to OR to obtain fistula graft revision; during procedure patient developed severe bradycardia 30-40s, became unresponsive, hypotensive started on levo/neo, unknown if lost pulses. Patient intubated. No compressions with patient being DNR. Admitted to ICU ?4/21 OR for fistula revision and new tunneled catheter placement ? ?Interim History / Subjective:  ? ?Just returned from the operating room.  She went this morning for a fistula revision and a new tunneled catheter placement. ? ?Objective   ?Blood pressure (!) 168/86, pulse 83, temperature 98.1 ?F (36.7 ?C), temperature source Oral, resp. rate 18, height 5' (1.524 m), weight 58.8 kg, last menstrual period 03/13/2002, SpO2 100 %. ?   ?  Vent Mode: PRVC ?FiO2 (%):  [30 %] 30 % ?Set Rate:  [18 bmp] 18 bmp ?Vt Set:  [360 mL] 360 mL ?PEEP:  [5 cmH20] 5 cmH20 ?Pressure Support:  [5 cmH20-8 cmH20] 8 cmH20 ?Plateau Pressure:  [12 cmH20-13 cmH20] 12 cmH20  ? ?Intake/Output Summary (Last 24 hours) at 04/11/2021 0905 ?Last data filed at 04/29/2021 0400 ?Gross per 24 hour  ?Intake 705 ml  ?Output 509 ml  ?Net 196 ml  ? ?Filed Weights  ? 04/25/21 1200 04/25/21 1510 04/19/2021 0330  ?Weight: 62.4 kg 61.7 kg 58.8 kg  ? ? ?Examination: ?General: Critically ill intubated on mechanical life support ?HEENT: Endotracheal tube in place ?Neuro: Left  pupil irregular status post cataract surgery, right pupil normal not following commands will open eyes to voice ?CV: Tachycardic, regular, S1-S2 ?PULM: Bilateral mechanically ventilated breath sounds ?GI: Soft, nontender nondistended ?Extremities: No significant edema ?Skin: No rash, right tunneled anterior catheter, new ? ? ?Resolved Hospital Problem list   ? ? ?Assessment & Plan:  ? ?Bradycardia w/ syncope: post procedure on 4/19 during fistula graft revision ?Possible PEA arrest, however was brief, no chest compressions ?Shock ?P: ?Off of vasopressors at this time ?Continue supportive care ? ?Acute respiratory failure due to above ?P: ?Continue low tidal volume ventilation strategy ?Wean PEEP and FiO2 as tolerated maintain sats above 90% ?VAP bundle ?Daily SBT SAT ?Mental status right now precludes liberation from the ventilator. ? ?Acute encephalopathy ?P: ?Mixed etiology for her encephalopathy. ?Hypotension, low flow state to her brain puts her at risk for anoxic injury. ?Also puts her at risk for watershed infarcts.  CT head with no significant change. ?Unable to get MRI due to metal on PD catheter. ?We will talk to nephrology about considerations for removal of PD catheter since she just had a new AV fistula placed. ? ?NSTEMI ?CAD ?HLD ?P: ?Continue aspirin statin ?Holding Coreg ? ?SVT ?P: ?Continue amiodarone ? ?ESRD: patient was on PD outpatient; AV fistula graft revised 4/19 for HD ?P: ?-AV fistula graft revised 4/19, however failed taken back on 04/29/2021 for fistula revision ?New tunneled PermCath on right placed on 04/10/2021. ?Continue dialysis per nephrology. ? ?Failure to thrive ?P: ?Appreciate palliative input ? ?HTN ?Acute on chronic systolic CHF: echo EF 83-66% ?P: ?Volume removal with HD ?Appreciate cardiology input ? ?T2DM ?P: ?CBGs with SSI ?Discontinue long-acting insulin ? ?Anemia of CKD ?P: ?Folate, iron, B12 ?Follow CBC for any acute signs of bleeding ? ?Neuropathy of foot ?P: ?Holding  gabapentin in the setting of encephalopathy ? ? ?Best Practice (right click and "Reselect all SmartList Selections" daily)  ? ?Diet/type: NPO w/ meds via tube ?DVT prophylaxis: prophylactic heparin  ?GI prophylaxis: PPI ?Lines: N/A ?Foley:  N/A ?Code Status:  full code ?Last date of multidisciplinary goals of care discussion  ? ?[4/19 spoke with niece Ann Bryan and updated over phone. She resides in [place], [place]. She states that she visited [place] on 4/18 and had discussion that she wanted to be full code and not DNR. She states that Ann Bryan would not want to be on ventilator long term. She would like [place] to change code status to full code. ] ? ?4/21 called and attempt to speak with family.  We will try again later today. ? ?Labs   ?CBC: ?Recent Labs  ?Lab 04/21/21 ?0332 04/22/21 ?0118 05/01/2021 ?0142 05/04/2021 ?0128 04/09/2021 ?1945 04/25/21 ?1100  ?WBC 11.9* 12.9* 14.9* 17.0*  --  18.4*  ?HGB 11.8* 12.0 12.4 11.4* 12.2 12.5  ?HCT 36.2  37.0 39.0 36.5 36.0 39.3  ?MCV 85.2 86.9 88.0 88.2  --  89.1  ?PLT 413* 421* 413* 401*  --  457*  ? ? ?Basic Metabolic Panel: ?Recent Labs  ?Lab 04/21/21 ?0332 04/22/21 ?0118 05/03/2021 ?0142 04/29/2021 ?2956 04/10/2021 ?1814 04/25/2021 ?1945 04/25/21 ?1332  ?NA 132* 135 136 132* 134* 130* 135  ?K 3.8 3.6 4.5 4.7 5.0 4.9 3.6  ?CL 96* 98 98 97* 101  --  98  ?CO2 21* 22 25 24  15*  --  26  ?GLUCOSE 386* 154* 146* 175* 163*  --  85  ?BUN 80* 80* 44* 31* 36*  --  26*  ?CREATININE 12.76* 13.05* 8.98* 7.06* 7.90*  --  5.36*  ?CALCIUM 7.3* 8.1* 8.0* 8.1* 8.3*  --  7.1*  ?MG  --   --  1.7  --  2.0  --   --   ?PHOS 7.5* 6.9* 5.3* 5.8*  --   --  3.9  ? ?GFR: ?Estimated Creatinine Clearance: 9.1 mL/min (A) (by C-G formula based on SCr of 5.36 mg/dL (H)). ?Recent Labs  ?Lab 04/22/21 ?0118 05/02/2021 ?0142 04/19/2021 ?0128 04/11/2021 ?1814 04/15/2021 ?2152 04/25/21 ?2130 04/25/21 ?0446 04/25/21 ?1100  ?WBC 12.9* 14.9* 17.0*  --   --   --   --  18.4*  ?LATICACIDVEN  --   --   --  4.6* 1.8 1.9 1.8  --   ? ? ?Liver Function  Tests: ?Recent Labs  ?Lab 04/21/21 ?0332 04/22/21 ?0118 05/02/2021 ?0142 04/20/2021 ?8657 04/25/21 ?1332  ?ALBUMIN <1.5* <1.5* <1.5* <1.5* <1.5*  ? ?No results for input(s): LIPASE, AMYLASE in the last 168 hours.

## 2021-04-26 NOTE — Progress Notes (Signed)
PT Cancellation Note ? ?Patient Details ?Name: Ann Bryan ?MRN: [id number] ?DOB: [birth year] ? ? ?Cancelled Treatment:    Reason Eval/Treat Not Completed: Patient not medically ready. RN reporting pt's HR is decreasing and BP is soft, requesting PT hold off on treating pt today. Will plan to follow-up another day as able. ? ?Moishe Spice, PT, DPT ?Acute Rehabilitation Services  ?Pager: [phone] ?Office: [phone] ? ? ? ?Ann Bryan ?04/11/2021, 3:53 PM ? ? ?

## 2021-04-27 ENCOUNTER — Inpatient Hospital Stay (HOSPITAL_COMMUNITY): Payer: Medicare Other

## 2021-04-27 DIAGNOSIS — Z515 Encounter for palliative care: Secondary | ICD-10-CM | POA: Diagnosis not present

## 2021-04-27 DIAGNOSIS — N186 End stage renal disease: Secondary | ICD-10-CM | POA: Diagnosis not present

## 2021-04-27 DIAGNOSIS — Z992 Dependence on renal dialysis: Secondary | ICD-10-CM | POA: Diagnosis not present

## 2021-04-27 DIAGNOSIS — G931 Anoxic brain damage, not elsewhere classified: Secondary | ICD-10-CM | POA: Diagnosis not present

## 2021-04-27 LAB — GLUCOSE, CAPILLARY
Glucose-Capillary: 225 mg/dL — ABNORMAL HIGH (ref 70–99)
Glucose-Capillary: 200 mg/dL — ABNORMAL HIGH (ref 70–99)
Glucose-Capillary: 259 mg/dL — ABNORMAL HIGH (ref 70–99)
Glucose-Capillary: 263 mg/dL — ABNORMAL HIGH (ref 70–99)
Glucose-Capillary: 258 mg/dL — ABNORMAL HIGH (ref 70–99)
Glucose-Capillary: 194 mg/dL — ABNORMAL HIGH (ref 70–99)

## 2021-04-27 LAB — CBC
HCT: 28.6 % — ABNORMAL LOW (ref 36.0–46.0)
Hemoglobin: 9.2 g/dL — ABNORMAL LOW (ref 12.0–15.0)
MCH: 28.3 pg (ref 26.0–34.0)
MCHC: 32.2 g/dL (ref 30.0–36.0)
MCV: 88 fL (ref 80.0–100.0)
Platelets: 282 10*3/uL (ref 150–400)
RBC: 3.25 MIL/uL — ABNORMAL LOW (ref 3.87–5.11)
RDW: 13.8 % (ref 11.5–15.5)
WBC: 22.7 10*3/uL — ABNORMAL HIGH (ref 4.0–10.5)
nRBC: 0 % (ref 0.0–0.2)

## 2021-04-27 LAB — PHOSPHORUS
Phosphorus: 3.1 mg/dL (ref 2.5–4.6)
Phosphorus: 4 mg/dL (ref 2.5–4.6)

## 2021-04-27 LAB — RENAL FUNCTION PANEL
Albumin: <1.5 g/dL — ABNORMAL LOW (ref 3.5–5.0)
Anion gap: 10 (ref 5–15)
BUN: 26 mg/dL — ABNORMAL HIGH (ref 6–20)
CO2: 26 mmol/L (ref 22–32)
Calcium: 7.4 mg/dL — ABNORMAL LOW (ref 8.9–10.3)
Chloride: 99 mmol/L (ref 98–111)
Creatinine, Ser: 3.68 mg/dL — ABNORMAL HIGH (ref 0.44–1.00)
GFR, Estimated: 14 mL/min — ABNORMAL LOW (ref >60)
Glucose, Bld: 249 mg/dL — ABNORMAL HIGH (ref 70–99)
Phosphorus: 3.1 mg/dL (ref 2.5–4.6)
Potassium: 3.5 mmol/L (ref 3.5–5.1)
Sodium: 135 mmol/L (ref 135–145)

## 2021-04-27 LAB — BASIC METABOLIC PANEL
Anion gap: 11 (ref 5–15)
BUN: 25 mg/dL — ABNORMAL HIGH (ref 6–20)
CO2: 26 mmol/L (ref 22–32)
Calcium: 7.4 mg/dL — ABNORMAL LOW (ref 8.9–10.3)
Chloride: 98 mmol/L (ref 98–111)
Creatinine, Ser: 3.66 mg/dL — ABNORMAL HIGH (ref 0.44–1.00)
GFR, Estimated: 14 mL/min — ABNORMAL LOW (ref >60)
Glucose, Bld: 247 mg/dL — ABNORMAL HIGH (ref 70–99)
Potassium: 3.4 mmol/L — ABNORMAL LOW (ref 3.5–5.1)
Sodium: 135 mmol/L (ref 135–145)

## 2021-04-27 LAB — MAGNESIUM
Magnesium: 1.7 mg/dL (ref 1.7–2.4)
Magnesium: 1.7 mg/dL (ref 1.7–2.4)

## 2021-04-27 MED ORDER — HEPARIN SODIUM (PORCINE) 1000 UNIT/ML IJ SOLN
INTRAMUSCULAR | Status: AC
  Administered 2021-04-27: 1000 [IU]
  Filled 2021-04-27: qty 4

## 2021-04-27 MED ORDER — CALCITRIOL 1 MCG/ML PO SOLN
1.0000 ug | ORAL | Status: DC
  Administered 2021-04-27: 1 ug
  Filled 2021-04-27: qty 1

## 2021-04-27 MED ORDER — IOHEXOL 350 MG/ML SOLN
50.0000 mL | Freq: Once | INTRAVENOUS | Status: AC | PRN
  Administered 2021-04-27: 50 mL via INTRAVENOUS

## 2021-04-27 MED ORDER — IOHEXOL 350 MG/ML SOLN
100.0000 mL | Freq: Once | INTRAVENOUS | Status: AC | PRN
  Administered 2021-04-27: 100 mL via INTRAVENOUS

## 2021-04-27 NOTE — Progress Notes (Signed)
PCCM: ? ?I called and spoke with the patients Niece.  ?I reordered the MRI brain that was cancelled.  ?I spoke with RT and bedside ?Currently in PS/CPAP but not following any commands ? ? ?Garner Nash, DO ?[facility] ?04/27/2021 11:26 AM   ? ? ?

## 2021-04-27 NOTE — Progress Notes (Signed)
Pt transported to and from MRI on the ventilator. °

## 2021-04-27 NOTE — Progress Notes (Signed)
? ?NAME:  Ann Bryan, MRN:  [id number], DOB:  1960-09-20, LOS: 34 ?ADMISSION DATE:  04/27/2021, CONSULTATION DATE:  4/19 ?REFERRING MD:  Dr. Trula Slade, CHIEF COMPLAINT:  bradycardia; intubated for   ? ?History of Present Illness:  ?This is a 61 year old female with past medical history of DM, HTN, HLD, neuropathy, ESRD on PD.  She presented to [facility] ED 05/02/2021 with generalized weakness and syncope. ?  ?In ED, she was found to have NSTEMI.  Her syncopal episode was felt to be due to hypoglycemia after her insulin regimen was adjusted recently before this episode. ?  ?She was admitted by [facility].  She was seen in consultation by cardiology after she was found to have new systolic CHF and possible cardiogenic shock in the setting of NSTEMI.  Right left heart cath showed severe three-vessel CAD for which PCI was not felt to be a great option nor was mechanical support.  It was recommended to manage NSTEMI medically though very little room for GDMT. ?  ?Palliative care was consulted 4/15 due to her multiple comorbidities and limited options.  After discussions, patient requested to be DNR. ?  ?IR consulted 4/16 for tunneled HD cath placement as it was felt that she was not tolerating PD well at home.  4/17, vascular surgery consulted for evaluation of new access either AVF versus AVG for long-term HD access. ?  ?4/19, she was taken to the operating room for new AVF placement.  Either peri or postop, she developed bradycardia and hypotension.  She was subsequently intubated due to concerns for near arrest.  Started on levo/neo for hypotension. No compressions. Unknown if patient lost pulses. PCCM was called in consultation and for assistance in the ICU. ? ?Pertinent  Medical History  ? ?Past Medical History:  ?Diagnosis Date  ? Anemia   ? Cataract   ? right eye  ? Complication of anesthesia   ?  " I woke up itching from anesthesia many years ago."   ? Depression   ? at times  ? Diabetes mellitus 1986  ?  poorly controlled last  hemoglobin A1c  10.5 on January 01, 2010, increase since June 2011 when hemoglobin A1c was 9.3 , determined to be most likely secondary to non-compliance.  ? Diabetic nephropathy ([place])   ? Diabetic retinopathy   ? Dyspnea   ? "when I have too much fluid"  ? End stage renal disease on dialysis [facility])   ? T/Th/Sat [address]  ? Glaucoma   ? History of blood transfusion   ? Hyperlipidemia   ? Hypertension   ? Nausea and vomiting   ? Neuropathy   ? Retinopathy   ? Wears glasses   ? ? ? ?Significant Hospital Events: ?Including procedures, antibiotic start and stop dates in addition to other pertinent events   ?4/11: admitted w/ syncope ?4/19: went to OR to obtain fistula graft revision; during procedure patient developed severe bradycardia 30-40s, became unresponsive, hypotensive started on levo/neo, unknown if lost pulses. Patient intubated. No compressions with patient being DNR. Admitted to ICU ?4/21 OR for fistula revision and new tunneled catheter placement, evening of 4/21 return to the operating room for evacuation of left arm hematoma and removal of peritoneal dialysis catheter ? ? ?Interim History / Subjective:  ? ?Went back to the operating room last night for a hematoma evacuation of the left arm.  Patient remains intubated on mechanical life support.  Currently undergoing dialysis ? ?Objective   ?Blood pressure 106/61, pulse 69,  temperature 98.7 ?F (37.1 ?C), temperature source Oral, resp. rate 17, height 5' (1.524 m), weight 61.7 kg, last menstrual period 03/13/2002, SpO2 100 %. ?   ?Vent Mode: PRVC ?FiO2 (%):  [30 %] 30 % ?Set Rate:  [18 bmp] 18 bmp ?Vt Set:  [360 mL] 360 mL ?PEEP:  [5 cmH20] 5 cmH20 ?Plateau Pressure:  [10 cmH20-13 cmH20] 12 cmH20  ? ?Intake/Output Summary (Last 24 hours) at 04/27/2021 0833 ?Last data filed at 04/27/2021 0400 ?Gross per 24 hour  ?Intake 1419.96 ml  ?Output 35 ml  ?Net 1384.96 ml  ? ?Filed Weights  ? 04/25/2021 0330 04/27/21 0515 04/27/21 0715  ?Weight: 58.8 kg 63.8 kg 61.7  kg  ? ? ?Examination: ?General: Patient remains critically ill intubated on mechanical life support ?HEENT: Endotracheal tube in place, will open eyes to stimulation ?Neuro: Left pupil irregular due to previous surgery, right pupil normal does follow commands opens eyes to voice we will attempt to track, has tried to squeeze hands ?CV: Tachycardic, regular, S1-S2 ?PULM: Bilateral mechanically ventilated breath sounds ?GI: Soft, nontender nondistended ?Extremities: No significant edema ?Skin: No rash, right anterior chest tunneled dialysis catheter ? ? ?Resolved Hospital Problem list   ? ? ?Assessment & Plan:  ? ?Bradycardia w/ syncope: post procedure on 4/19 during fistula graft revision ?Possible PEA arrest, however was brief, no chest compressions ?Shock ?P: ?Continue postarrest supportive care, off vasopressors at this time ?Continue to observe for change in any neurologic improvement ? ?Acute respiratory failure due to above ?P: ?Continue low tidal volume ventilation strategy ?Continue to wean PEEP and FiO2 to maintain sats above 90% ?Continue VAP bundle ?Daily SBT SAT ?Mental status at this time precludes liberation from the ventilator. ?Hopeful with ongoing dialysis reduction of BUN and allowing time for sedative meds to dissipate after receiving dialysis hopefully her mental status will continue to improve as we have seen over the past 24 hours. ? ?Acute encephalopathy ?P: ?I suspect this is a mixed etiology of the encephalopathy. ?Hypotension and a low flow state is at risk for anoxic injury ?CT head was clear except for previous stroke and senescent changes. ?Unable to get a MRI due to the PD catheter however this has been removed. ?But her mental status is slowly improved would like to hold off on getting the MRI at this time. ?If needed we can get that this weekend. ? ?NSTEMI ?CAD ?HLD ?P: ?Continue aspirin, statin, Coreg ? ?SVT ?P: ?Continue amiodarone ? ?ESRD: patient was on PD outpatient; AV fistula  graft revised 4/19 for HD ?P: ?-AV fistula graft revised 4/19, however failed taken back on 05/02/2021 for fistula revision ?New tunneled PermCath on right placed on 04/27/2021. ?Dialysis session today ? ?Failure to thrive ?P: ?We appreciate palliative care input regarding all of her medical comorbidities ? ?HTN ?Acute on chronic systolic CHF: echo EF 01-74% ?P: ?We will need ongoing volume removal with HD ?Appreciate cardiology input ? ?T2DM ?P: ?CBGs with SSI ?Discontinuation of long-acting insulin due to hypoglycemic events ? ?Anemia of CKD ?P: ?Folate, iron, B12 ?Follow CBCs for any acute signs of bleeding ? ?Neuropathy of foot ?P: ?Holding PTA gabapentin in the setting of encephalopathy ? ? ?Best Practice (right click and "Reselect all SmartList Selections" daily)  ? ?Diet/type: NPO w/ meds via tube ?DVT prophylaxis: prophylactic heparin  ?GI prophylaxis: PPI ?Lines: N/A ?Foley:  N/A ?Code Status:  full code ?Last date of multidisciplinary goals of care discussion  ? ?4/21 called and spoke with the patient's daughter  yesterday evening ? ?Labs   ?CBC: ?Recent Labs  ?Lab 04/21/21 ?0332 04/22/21 ?0118 04/29/2021 ?0142 04/25/2021 ?0128 05/02/2021 ?1945 04/25/21 ?1100  ?WBC 11.9* 12.9* 14.9* 17.0*  --  18.4*  ?HGB 11.8* 12.0 12.4 11.4* 12.2 12.5  ?HCT 36.2 37.0 39.0 36.5 36.0 39.3  ?MCV 85.2 86.9 88.0 88.2  --  89.1  ?PLT 413* 421* 413* 401*  --  457*  ? ? ?Basic Metabolic Panel: ?Recent Labs  ?Lab 04/22/21 ?0118 05/05/2021 ?0142 04/06/2021 ?3244 05/03/2021 ?1814 05/01/2021 ?1945 04/25/21 ?1332 04/27/2021 ?1431 05/01/2021 ?1758  ?NA 135 136 132* 134* 130* 135  --   --   ?K 3.6 4.5 4.7 5.0 4.9 3.6  --   --   ?CL 98 98 97* 101  --  98  --   --   ?CO2 22 25 24  15*  --  26  --   --   ?GLUCOSE 154* 146* 175* 163*  --  85  --   --   ?BUN 80* 44* 31* 36*  --  26*  --   --   ?CREATININE 13.05* 8.98* 7.06* 7.90*  --  5.36*  --   --   ?CALCIUM 8.1* 8.0* 8.1* 8.3*  --  7.1*  --   --   ?MG  --  1.7  --  2.0  --   --  1.8 1.8  ?PHOS 6.9* 5.3* 5.8*   --   --  3.9 6.3* 6.6*  ? ?GFR: ?Estimated Creatinine Clearance: 9.3 mL/min (A) (by C-G formula based on SCr of 5.36 mg/dL (H)). ?Recent Labs  ?Lab 04/22/21 ?0118 04/21/2021 ?0142 04/12/2021 ?0128 04/15/2021

## 2021-04-27 NOTE — Progress Notes (Signed)
? ?  Palliative Medicine Inpatient Follow Up Note ? ? ?HPI: ?61 year old with a history of DM2, HTN, HLD, diabetic neuropathy, and ESRD on PD who presented to the ED with severe generalized weakness for approximately 6 to 7 days and an eventual pre-syncopal spell resulting in a fall without loss of consciousness.  After her fall she was too weak to get up and spent the night on the floor, thusly missing her PD.  A neighbor called EMS who arrived to find the patient on the floor with stable vital signs and a normal CBG.  In the ED the patient was hypothermic with temperature 95.4.  WBC was borderline at 10. ?  ?Palliative care has been asked to get involved to discuss goals of care in the setting of multiple co-morbidities.  ? ?Today's Discussion (04/27/2021): ? ?*Please note that this is a verbal dictation therefore any spelling or grammatical errors are due to the "[place] [employer]" system interpretation. ? ?Chart reviewed inclusive of vital signs, progress notes, laboratory results, and diagnostic images.  ? ?I met with Ann Bryan at bedside this morning, she was able to faintly open her eyes when her name was called. She was not able to follow commands such as squeezing my hand or blinking.  ? ?Per HD RN at bedside he had started the treatment around 550 this morning.  ? ?I called patients niece, Ann Bryan this afternoon. We reviewed the continued hope for Ann Bryan to awaken. Presently we are watchfully waiting. Support provided through empathetic listening.   ? ?Questions and concerns addressed  ? ?Palliative Support Provided ? ?Objective Assessment: ?Vital Signs ?Vitals:  ? 04/27/21 1130 04/27/21 1145  ?BP: 124/67 123/63  ?Pulse: 87 88  ?Resp: 11 14  ?Temp:    ?SpO2: 100% 100%  ? ? ?Intake/Output Summary (Last 24 hours) at 04/27/2021 1222 ?Last data filed at 04/27/2021 0915 ?Gross per 24 hour  ?Intake 1288.76 ml  ?Output 25 ml  ?Net 1263.76 ml  ? ? ?Last Weight  Most recent update: 04/27/2021  9:31 AM  ? ? Weight  ?61.7 kg  (136 lb 0.4 oz)  ?      ? ?  ? ?Gen:  Older AA F chronically ill in appearance ?HEENT: ETT, coretrack, dry mucous membranes ?CV: regular rate and irregular rhythm ?PULM: On ventilator ?ABD: soft/nontender ?EXT: No edema  ?Neuro: Disoriented ? ?SUMMARY OF RECOMMENDATIONS   ?DNAR/DNI ? ?Patients niece remains hopeful that cognitive state will improve with dialysis - she plans on coming this late afternoon ? ?Ongoing PMT support ? ?MDM - High ?______________________________________________________________________________________ ?Tacey Ruiz ?[facility] Team ?Team Cell Phone: [phone] ?Please utilize secure chat with additional questions, if there is no response within 30 minutes please call the above phone number ? ?Palliative Medicine Team providers are available by phone from 7am to 7pm daily and can be reached through the team cell phone.  ?Should this patient require assistance outside of these hours, please call the patient's attending physician. ? ? ? ? ?

## 2021-04-27 NOTE — Consult Note (Signed)
Neurology Consultation ?Reason for Consult: Abnormal MRI ?Referring Physician: Icard, B ? ?CC: Right sided weakness ? ?History is obtained from:chart review.  ? ?HPI: Ann Bryan is a 61 y.o. female with a history of diabetes, hypertension, hyperlipidemia who was admitted with generalized weakness and syncope and found to have NSTEMI and hyperglycemia.  She suffered bradycardia and hypotension and was intubated for near arrest, though unclear if she was ever pulseless.  Charting for the procedure shows blood pressure as low as 50/20.  She had systolics in the 82X to 93Z from 16/15 until 1641.  Following this, she has not woken up appropriately, and therefore an MRI was obtained. ? ? ?LKW: 4/19 ?tpa given?: no, hypoxic brain injury as etiology ? ? ? ?ROS: Unable to obtain due to altered mental status.  ? ?Past Medical History:  ?Diagnosis Date  ? Anemia   ? Cataract   ? right eye  ? Complication of anesthesia   ?  " I woke up itching from anesthesia many years ago."   ? Depression   ? at times  ? Diabetes mellitus 1986  ?  poorly controlled last hemoglobin A1c  10.5 on January 01, 2010, increase since June 2011 when hemoglobin A1c was 9.3 , determined to be most likely secondary to non-compliance.  ? Diabetic nephropathy ([place])   ? Diabetic retinopathy   ? Dyspnea   ? "when I have too much fluid"  ? End stage renal disease on dialysis [facility])   ? T/Th/Sat [address]  ? Glaucoma   ? History of blood transfusion   ? Hyperlipidemia   ? Hypertension   ? Nausea and vomiting   ? Neuropathy   ? Retinopathy   ? Wears glasses   ? ? ? ?Family History  ?Problem Relation Age of Onset  ? Diabetes Mother   ? Stroke Mother   ? Hypertension Mother   ? Alcohol abuse Mother   ? Depression Father   ?     Committed suicide  ? Alcohol abuse Maternal Uncle   ? ? ? ?Social History:  reports that she has never smoked. She has never used smokeless tobacco. She reports that she does not drink alcohol and does not use  drugs. ? ? ?Exam: ?Current vital signs: ?BP (!) 118/59   Pulse 68   Temp 98.2 ?F (36.8 ?C) (Oral)   Resp 19   Ht 5' (1.524 m)   Wt 61.7 kg   LMP 03/13/2002   SpO2 100%   BMI 26.57 kg/m?  ?Vital signs in last 24 hours: ?Temp:  [97.5 ?F (36.4 ?C)-99.9 ?F (37.7 ?C)] 98.2 ?F (36.8 ?C) (04/22 1921) ?Pulse Rate:  [65-98] 68 (04/22 1915) ?Resp:  [0-22] 19 (04/22 1915) ?BP: (99-158)/(54-75) 118/59 (04/22 1915) ?SpO2:  [94 %-100 %] 100 % (04/22 1915) ?FiO2 (%):  [30 %] 30 % (04/22 1955) ?Weight:  [61.7 kg-63.8 kg] 61.7 kg (04/22 0915) ? ? ?Physical Exam  ?General: Patient is intubated in the intensive care unit ? ?Neuro: ?Mental Status: ?Patient i opens eyes to noxious stimulation, she does not follow commands cranial Nerves: ?II: She does not blink to threat, right pupil 6 mm and sluggish, left pupil irregular and nonreactive (appears postsurgical) ?III,IV, VI: Doll's eye maneuver intact ?V:VII: Corneals are intact ?Motor: ?She has no movement on the right, withdrawal on the left.  ?Sensory: ?She grimaces on the right, not  on the left ?Cerebellar: ?Does not perform ? ? ? ?I have reviewed labs in epic  and the results pertinent to this consultation are: ?Creatinine 3.68 ? ?I have reviewed the images obtained: MRI brain-isolated cortex diffusion change in a typical hypoxic watershed distribution on the right, there is change in that same area on the left but also involving ACA territory. ? 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  She has significant cortical diffusion change, and I suspect that her injury will be significantly disabling.  She has a carotid occlusion, but given the perfusion scan without significant hypoperfusion on the left, I suspect that this is chronic. ? ?Given that palliative discussions were already ongoing even prior to her severe hypotensive episode on 4/19, I think would be reasonable to continue these discussions.  I  discussed with the niece who states that she is not sure her aunt would want any type of feeding tube or long-term breathing tube. ? ?Given that her exam seems slightly out of proportion to what is seen on MRI (though this can certainly happen in a hypoxic brain injury) I will get an EEG as well to rule out seizure activity. ? ?Recommendations: ?1) EEG ?2) avoid hypotension ?3) continue palliative discussions with family. ? ? ?Roland Rack, MD ?[facility] ?[phone] ? ?If 7pm- 7am, please page neurology on call as listed in [place]. ? ?

## 2021-04-27 NOTE — Progress Notes (Signed)
?[facility] ?Progress Note  ? ?Subjective:  in OR yest VVS did thrombectomy of AVF, also check TDC which was working well w/o kinks or other issues so was not replaced. She went back to OR in the evening for I&D of shoulder hematoma and removal of PD catheter.  ? ?Objective ?Vitals:  ? 04/27/21 0400 04/27/21 0515 04/27/21 0645 04/27/21 0700  ?BP: (!) 106/58 101/72 111/65 (!) 101/58  ?Pulse: 68 71 68 67  ?Resp: (!) 22 19 (!) 21 20  ?Temp:  98.7 ?F (37.1 ?C)    ?TempSrc:  Oral    ?SpO2: 100% 100%    ?Weight:  63.8 kg    ?Height:      ? ?Physical Exam ?Gen on vent, sedated ?Sclera anicteric, throat w/ ETT ?No jvd or bruits ?Chest clear anterior/ lateral ?RRR no RG ?Abd soft ntnd no mass or ascites +bs ?Ext no LE or UE edema ?Neuro is on vent, sedated ?  LUE AVF+bruit ? ?Dialysis Orders: ?Prev on CCPD - managed by Dr. Joelyn Oms ?Now transitioning to HD d/t PD failure - establishing orders. ?- 4/17 hep B Ag was neg and hep B Abs were > 10 (= protective)  ?  ?Assessment/Plan: ?Hypotension - during 1st HD access surgery, resolved. Off pressors.  ?VDRF - per CCM ?Fall/AMS/weakness: admit issue, had resolved. ?Clotted AVF - thrombectomy of AVF 4/21 by VVS. ?ESRD: was on CCPD, failing modality, ^ BUN, alb < 1.5 and progressive debility. Pt transitioned to HD. Doing HD here TTS for now. Unable to dialyze Thursday w/ poor TDC function. On HD this am, cath function remains poor, cath is positional. BFR was around 200 max. Will attempt to use AVF next HD on Monday (ok to use per VVS).   ?Dialysis access: IR placed [facility] 04/22/21. Renal SW working on HD placement. VVS revised pt's old AVF 4/19. PD cath removed 4/21. Then had AVF thrombectomy and TDC assessment (patent, no kinks) per VVS on 4/21.  ?HTN/volume: BP stable at this time, no LE edema. Wt's stable, no ^vol on exam.  ?MBD ckd: cont rocaltrol. Given severe arterial Ca++ changed to renvela as nonCa++ binder.  ?Anemia of ESRD: Hgb > 12, no ESA needed ?Severe  systolic HF: New Dx, d/t #3, echo 4/11 with EF 21% ?Severe 3V CAD/NSTEMI: New Dx, LHC 4/14 without targets for intervention ?Nutrition: Alb < 1.5, adding supplements ?Dispo: may need SNF placement. ? ?Kelly Splinter, MD ?04/27/2021, 7:22 AM ? ?Recent Labs  ?Lab 05/01/2021 ?0454 04/06/2021 ?1814 04/22/2021 ?1945 04/25/21 ?1100 04/25/21 ?1332 04/12/2021 ?1431 04/07/2021 ?1758  ?HGB  --   --  12.2 12.5  --   --   --   ?ALBUMIN <1.5*  --   --   --  <1.5*  --   --   ?CALCIUM 8.1* 8.3*  --   --  7.1*  --   --   ?PHOS 5.8*  --   --   --  3.9 6.3* 6.6*  ?CREATININE 7.06* 7.90*  --   --  5.36*  --   --   ?K 4.7 5.0 4.9  --  3.6  --   --   ? ? ? ? ? ?Medications: ? sodium chloride    ? dexmedetomidine (PRECEDEX) IV infusion 0.4 mcg/kg/hr (04/27/21 0400)  ? dextrose Stopped (05/05/2021 2205)  ? feeding supplement (VITAL 1.5 CAL) 45 mL/hr at 04/27/21 0400  ? norepinephrine (LEVOPHED) Adult infusion Stopped (04/27/2021 2145)  ? propofol (DIPRIVAN) infusion Stopped (04/25/21 4332)  ? ?  acetaminophen (TYLENOL) oral liquid 160 mg/5 mL  325 mg Per Tube Q6H  ? amiodarone  200 mg Per Tube BID  ? aspirin  81 mg Per Tube Daily  ? atorvastatin  80 mg Per Tube Daily  ? brimonidine  1 drop Left Eye BID  ? calcitRIOL  1 mcg Per Tube Once per day on Tue Thu Sat  ? colchicine  0.3 mg Per Tube Daily  ? docusate  100 mg Per Tube BID  ? feeding supplement (PROSource TF)  90 mL Per Tube BID  ? heparin  5,000 Units Subcutaneous Q8H  ? heparin sodium (porcine)      ? insulin aspart  0-6 Units Subcutaneous Q4H  ? loratadine  10 mg Per Tube Daily  ? mouth rinse  15 mL Mouth Rinse 10 times per day  ? multivitamin  1 tablet Per Tube QHS  ? nystatin   Topical TID  ? pantoprazole sodium  40 mg Per Tube Daily  ? polyethylene glycol  17 g Per Tube Daily  ? sevelamer carbonate  2,400 mg Per Tube TID WC  ? ? ? ? ?

## 2021-04-27 NOTE — Progress Notes (Signed)
[facility] [place] ?PROGRESS NOTE ? ?ASSESSMENT / PLAN: ?Ann Bryan is a 61 y.o. female with ESRD dialyzing via RIJ TDC. S/P L cephalic vein turndown 9/32/67, thrombectomy 04/22/2021, and evacuation of hematoma 04/12/2021.  ? ?The fistula has a strong thrill today. The catheter is functioning poorly. We can start using the fistula early next week for HD.  ? ?Globally she has a poor prognosis, and does not seem to be following commands. She remains critically ill and intubated.  ? ?SUBJECTIVE: ?Intubated. Opens eyes to my voice. Not following commands. ? ?OBJECTIVE: ?BP 123/63   Pulse 88   Temp 99.9 ?F (37.7 ?C) (Axillary)   Resp 14   Ht 5' (1.524 m)   Wt 61.7 kg   LMP 03/13/2002   SpO2 100%   BMI 26.57 kg/m?  ? ?Intake/Output Summary (Last 24 hours) at 04/27/2021 1220 ?Last data filed at 04/27/2021 0915 ?Gross per 24 hour  ?Intake 1288.76 ml  ?Output 25 ml  ?Net 1263.76 ml  ?  ?Constitutional: critically ill. Intubated.  ?CNS: GCS 9T.  ?Cardiac: RRR. ?Vascular: strong thrill in left arm fistula. TDC in place.  ? ? ?  Latest Ref Rng & Units 04/27/2021  ? 10:11 AM 04/25/2021  ? 11:00 AM 04/10/2021  ?  7:45 PM  ?CBC  ?WBC 4.0 - 10.5 K/uL 22.7   18.4     ?Hemoglobin 12.0 - 15.0 g/dL 9.2   12.5   12.2    ?Hematocrit 36.0 - 46.0 % 28.6   39.3   36.0    ?Platelets 150 - 400 K/uL 282   457     ?  ? ? ?  Latest Ref Rng & Units 04/27/2021  ?  8:16 AM 04/25/2021  ?  1:32 PM 04/30/2021  ?  7:45 PM  ?CMP  ?Glucose 70 - 99 mg/dL 247    ? 249   85     ?BUN 6 - 20 mg/dL 25    ? 26   26     ?Creatinine 0.44 - 1.00 mg/dL 3.66    ? 3.68   5.36     ?Sodium 135 - 145 mmol/L 135    ? 135   135   130    ?Potassium 3.5 - 5.1 mmol/L 3.4    ? 3.5   3.6   4.9    ?Chloride 98 - 111 mmol/L 98    ? 99   98     ?CO2 22 - 32 mmol/L 26    ? 26   26     ?Calcium 8.9 - 10.3 mg/dL 7.4    ? 7.4   7.1     ? ? ?Estimated Creatinine Clearance: 13.6 mL/min (A) (by C-G formula based on SCr of 3.66 mg/dL (H)). ? ?[name]. Stanford Breed,  MD ?[facility] [place] ?Office Phone Number: [phone] ?04/27/2021 12:20 PM ? ? ? ?

## 2021-04-28 DIAGNOSIS — E872 Acidosis, unspecified: Secondary | ICD-10-CM | POA: Diagnosis not present

## 2021-04-28 DIAGNOSIS — R55 Syncope and collapse: Secondary | ICD-10-CM

## 2021-04-28 DIAGNOSIS — I5021 Acute systolic (congestive) heart failure: Secondary | ICD-10-CM | POA: Diagnosis not present

## 2021-04-28 DIAGNOSIS — Z7189 Other specified counseling: Secondary | ICD-10-CM | POA: Diagnosis not present

## 2021-04-28 DIAGNOSIS — R569 Unspecified convulsions: Secondary | ICD-10-CM | POA: Diagnosis not present

## 2021-04-28 DIAGNOSIS — Z515 Encounter for palliative care: Secondary | ICD-10-CM | POA: Diagnosis not present

## 2021-04-28 DIAGNOSIS — I214 Non-ST elevation (NSTEMI) myocardial infarction: Secondary | ICD-10-CM | POA: Diagnosis not present

## 2021-04-28 LAB — BASIC METABOLIC PANEL
Anion gap: 13 (ref 5–15)
Anion gap: 15 (ref 5–15)
BUN: 46 mg/dL — ABNORMAL HIGH (ref 6–20)
BUN: 55 mg/dL — ABNORMAL HIGH (ref 6–20)
CO2: 23 mmol/L (ref 22–32)
CO2: 21 mmol/L — ABNORMAL LOW (ref 22–32)
Calcium: 7.7 mg/dL — ABNORMAL LOW (ref 8.9–10.3)
Calcium: 7.9 mg/dL — ABNORMAL LOW (ref 8.9–10.3)
Chloride: 96 mmol/L — ABNORMAL LOW (ref 98–111)
Chloride: 96 mmol/L — ABNORMAL LOW (ref 98–111)
Creatinine, Ser: 5.49 mg/dL — ABNORMAL HIGH (ref 0.44–1.00)
Creatinine, Ser: 5.84 mg/dL — ABNORMAL HIGH (ref 0.44–1.00)
GFR, Estimated: 8 mL/min — ABNORMAL LOW (ref >60)
GFR, Estimated: 8 mL/min — ABNORMAL LOW (ref >60)
Glucose, Bld: 292 mg/dL — ABNORMAL HIGH (ref 70–99)
Glucose, Bld: 458 mg/dL — ABNORMAL HIGH (ref 70–99)
Potassium: 4.5 mmol/L (ref 3.5–5.1)
Potassium: 5.2 mmol/L — ABNORMAL HIGH (ref 3.5–5.1)
Sodium: 132 mmol/L — ABNORMAL LOW (ref 135–145)
Sodium: 132 mmol/L — ABNORMAL LOW (ref 135–145)

## 2021-04-28 LAB — GLUCOSE, CAPILLARY
Glucose-Capillary: 130 mg/dL — ABNORMAL HIGH (ref 70–99)
Glucose-Capillary: 427 mg/dL — ABNORMAL HIGH (ref 70–99)
Glucose-Capillary: 444 mg/dL — ABNORMAL HIGH (ref 70–99)
Glucose-Capillary: 482 mg/dL — ABNORMAL HIGH (ref 70–99)
Glucose-Capillary: 237 mg/dL — ABNORMAL HIGH (ref 70–99)
Glucose-Capillary: 387 mg/dL — ABNORMAL HIGH (ref 70–99)

## 2021-04-28 MED ORDER — ACETAMINOPHEN 325 MG PO TABS
650.0000 mg | ORAL_TABLET | Freq: Four times a day (QID) | ORAL | Status: DC | PRN
Start: 2021-04-28 — End: 2021-04-29

## 2021-04-28 MED ORDER — ONDANSETRON 4 MG PO TBDP
4.0000 mg | ORAL_TABLET | Freq: Four times a day (QID) | ORAL | Status: DC | PRN
Start: 2021-04-28 — End: 2021-04-29
  Filled 2021-04-28: qty 1

## 2021-04-28 MED ORDER — ACETAMINOPHEN 650 MG RE SUPP: 650.0000 mg | Freq: Four times a day (QID) | RECTAL | Status: DC | PRN

## 2021-04-28 MED ORDER — GLYCOPYRROLATE 0.2 MG/ML IJ SOLN
0.4000 mg | INTRAMUSCULAR | Status: DC
  Administered 2021-04-28: 0.4 mg via INTRAVENOUS
  Filled 2021-04-28: qty 2

## 2021-04-28 MED ORDER — ONDANSETRON HCL 4 MG/2ML IJ SOLN: 4.0000 mg | Freq: Four times a day (QID) | INTRAMUSCULAR | Status: DC | PRN

## 2021-04-28 MED ORDER — VANCOMYCIN HCL 750 MG/150ML IV SOLN: 750.0000 mg | INTRAVENOUS | Status: DC

## 2021-04-28 MED ORDER — VANCOMYCIN HCL 1250 MG/250ML IV SOLN
1250.0000 mg | Freq: Once | INTRAVENOUS | Status: AC
  Administered 2021-04-28: 1250 mg via INTRAVENOUS
  Filled 2021-04-28: qty 250

## 2021-04-28 MED ORDER — SODIUM CHLORIDE 0.9 % IV SOLN: 2.0000 g | INTRAVENOUS | Status: DC

## 2021-04-28 MED ORDER — SODIUM CHLORIDE 0.9 % IV SOLN: 2.0000 g | Freq: Once | INTRAVENOUS | Status: DC

## 2021-04-28 MED ORDER — FENTANYL 2500MCG IN NS 250ML (10MCG/ML) PREMIX INFUSION
0.0000 ug/h | INTRAVENOUS | Status: DC
  Administered 2021-04-28: 200 ug/h via INTRAVENOUS
  Filled 2021-04-28: qty 250

## 2021-04-28 MED ORDER — BIOTENE DRY MOUTH MT LIQD: 15.0000 mL | OROMUCOSAL | Status: DC | PRN

## 2021-04-28 MED ORDER — INSULIN DETEMIR 100 UNIT/ML ~~LOC~~ SOLN
10.0000 [IU] | Freq: Every day | SUBCUTANEOUS | Status: DC
  Administered 2021-04-28: 10 [IU] via SUBCUTANEOUS
  Filled 2021-04-28: qty 0.1

## 2021-04-28 MED ORDER — SODIUM CHLORIDE 0.9 % IV SOLN
1.0000 g | INTRAVENOUS | Status: DC
  Administered 2021-04-28: 1 g via INTRAVENOUS
  Filled 2021-04-28: qty 1

## 2021-04-28 MED ORDER — POLYVINYL ALCOHOL 1.4 % OP SOLN
1.0000 [drp] | Freq: Four times a day (QID) | OPHTHALMIC | Status: DC | PRN
  Filled 2021-04-28: qty 15

## 2021-04-28 MED ORDER — FENTANYL BOLUS VIA INFUSION
50.0000 ug | INTRAVENOUS | Status: DC | PRN
Start: 2021-04-28 — End: 2021-04-29
  Filled 2021-04-28: qty 50

## 2021-04-29 ENCOUNTER — Encounter (HOSPITAL_COMMUNITY): Payer: Self-pay | Admitting: Vascular Surgery

## 2021-04-29 IMAGING — US IR US GUIDE VASC ACCESS RIGHT
1 series · 1 of 1 positions shown · non-contrast
Comparison: none

INDICATION: End-stage renal disease, no current access for dialysis

[Series 1: ir us guide vasc access right · 1 of 1 slices shown]
[im 1/1]
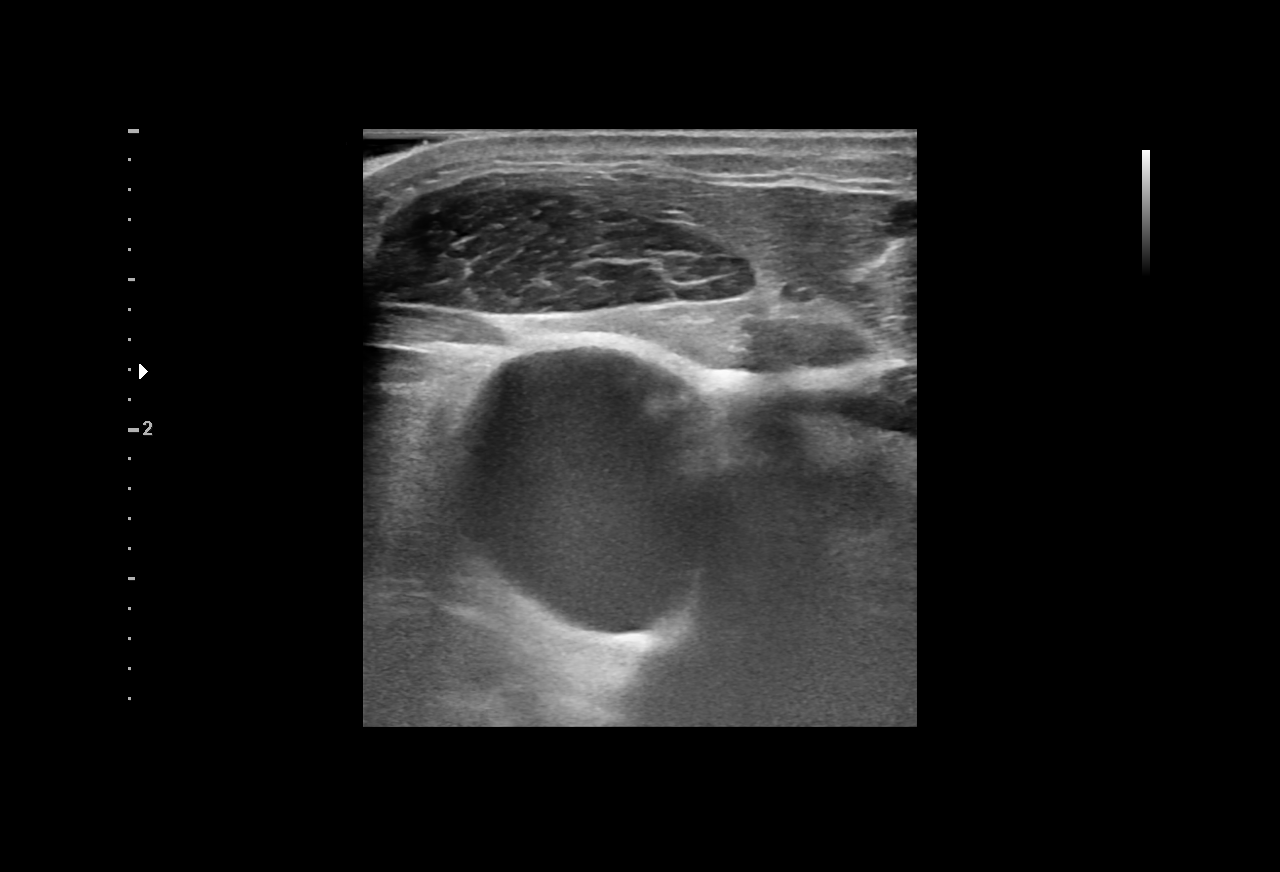

[1 of 1 positions shown; findings below may reference images not displayed]

EXAM:
ULTRASOUND GUIDANCE FOR VASCULAR ACCESS

RIGHT INTERNAL JUGULAR PERMANENT HEMODIALYSIS CATHETER

Radiologist:  Lippert, Georges

Guidance:  Ultrasound and fluoroscopic

FLUOROSCOPY TIME:  Fluoroscopy Time: 0 minutes 42 seconds (7 mGy).

MEDICATIONS:
Ancef 2 g within 1 hour of the procedure

ANESTHESIA/SEDATION:
Versed 0 mg IV; Fentanyl 25 mcg IV;

Moderate Sedation Time:  15 minutes

The patient was continuously monitored during the procedure by the
interventional radiology nurse under my direct supervision.

CONTRAST:  None.

COMPLICATIONS:
None immediate.

PROCEDURE:
Informed consent was obtained from the patient following explanation
of the procedure, risks, benefits and alternatives. The patient
understands, agrees and consents for the procedure. All questions
were addressed. A time out was performed.

Maximal barrier sterile technique utilized including caps, mask,
sterile gowns, sterile gloves, large sterile drape, hand hygiene,
and 2% chlorhexidine scrub.

Under sterile conditions and local anesthesia, right internal
jugular micropuncture venous access was performed with ultrasound.
Images were obtained for documentation of the patent right internal
jugular vein. A guide wire was inserted followed by a transitional
dilator. Next, a 0.035 guidewire was advanced into the IVC with a
5-French catheter. Measurements were obtained from the right
venotomy site to the proximal right atrium. In the right
infraclavicular chest, a subcutaneous tunnel was created under
sterile conditions and local anesthesia. 1% lidocaine with
epinephrine was utilized for this. The 23 cm tip to cuff palindrome
catheter was tunneled subcutaneously to the venotomy site and
inserted into the SVC/RA junction through a valved peel-away sheath.
Position was confirmed with fluoroscopy. Images were obtained for
documentation. Blood was aspirated from the catheter followed by
saline and heparin flushes. The appropriate volume and strength of
heparin was instilled in each lumen. Caps were applied. The catheter
was secured at the tunnel site with Gelfoam and a pursestring
suture. The venotomy site was closed with subcuticular Vicryl
suture. Dermabond was applied to the small right neck incision. A
dry sterile dressing was applied. The catheter is ready for use. No
immediate complications.
IMPRESSION: Ultrasound and fluoroscopically guided right internal jugular
tunneled hemodialysis catheter (23 cm tip to cuff palindrome
catheter).

## 2021-04-29 IMAGING — US IR FLUORO GUIDE CV LINE*R*
1 series · 1 of 1 positions shown · non-contrast
Comparison: none

INDICATION: End-stage renal disease, no current access for dialysis

[Series 1: ir fluoro guide cv line*right* · 1 of 1 slices shown]
[im 1/1]
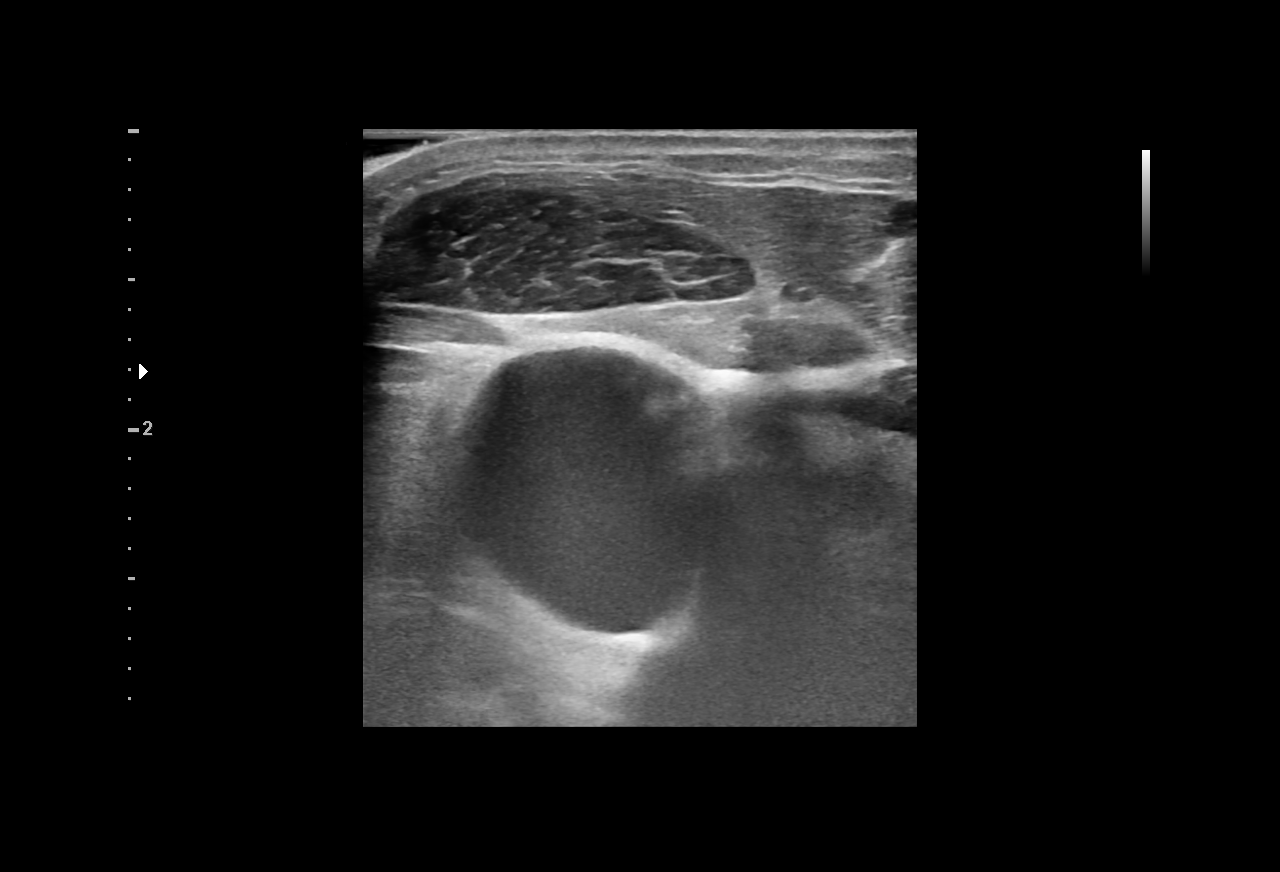

[1 of 1 positions shown; findings below may reference images not displayed]

EXAM:
ULTRASOUND GUIDANCE FOR VASCULAR ACCESS

RIGHT INTERNAL JUGULAR PERMANENT HEMODIALYSIS CATHETER

Radiologist:  Lippert, Georges

Guidance:  Ultrasound and fluoroscopic

FLUOROSCOPY TIME:  Fluoroscopy Time: 0 minutes 42 seconds (7 mGy).

MEDICATIONS:
Ancef 2 g within 1 hour of the procedure

ANESTHESIA/SEDATION:
Versed 0 mg IV; Fentanyl 25 mcg IV;

Moderate Sedation Time:  15 minutes

The patient was continuously monitored during the procedure by the
interventional radiology nurse under my direct supervision.

CONTRAST:  None.

COMPLICATIONS:
None immediate.

PROCEDURE:
Informed consent was obtained from the patient following explanation
of the procedure, risks, benefits and alternatives. The patient
understands, agrees and consents for the procedure. All questions
were addressed. A time out was performed.

Maximal barrier sterile technique utilized including caps, mask,
sterile gowns, sterile gloves, large sterile drape, hand hygiene,
and 2% chlorhexidine scrub.

Under sterile conditions and local anesthesia, right internal
jugular micropuncture venous access was performed with ultrasound.
Images were obtained for documentation of the patent right internal
jugular vein. A guide wire was inserted followed by a transitional
dilator. Next, a 0.035 guidewire was advanced into the IVC with a
5-French catheter. Measurements were obtained from the right
venotomy site to the proximal right atrium. In the right
infraclavicular chest, a subcutaneous tunnel was created under
sterile conditions and local anesthesia. 1% lidocaine with
epinephrine was utilized for this. The 23 cm tip to cuff palindrome
catheter was tunneled subcutaneously to the venotomy site and
inserted into the SVC/RA junction through a valved peel-away sheath.
Position was confirmed with fluoroscopy. Images were obtained for
documentation. Blood was aspirated from the catheter followed by
saline and heparin flushes. The appropriate volume and strength of
heparin was instilled in each lumen. Caps were applied. The catheter
was secured at the tunnel site with Gelfoam and a pursestring
suture. The venotomy site was closed with subcuticular Vicryl
suture. Dermabond was applied to the small right neck incision. A
dry sterile dressing was applied. The catheter is ready for use. No
immediate complications.
IMPRESSION: Ultrasound and fluoroscopically guided right internal jugular
tunneled hemodialysis catheter (23 cm tip to cuff palindrome
catheter).

## 2021-04-29 NOTE — Anesthesia Postprocedure Evaluation (Signed)
Anesthesia Post Note ? ?Patient: Ann Bryan ? ?Procedure(s) Performed: INSERTION OF DIALYSIS CATHETER (Right: Neck) ?THROMBECTOMY LEFT ARM ARTERIOVENOUS FISTULA (Left: Arm Upper) ? ?  ? ?Patient location during evaluation: SICU ?Anesthesia Type: General ?Level of consciousness: sedated ?Pain management: pain level controlled ?Vital Signs Assessment: post-procedure vital signs reviewed and stable ?Respiratory status: patient remains intubated per anesthesia plan ?Cardiovascular status: stable ?Postop Assessment: no apparent nausea or vomiting ?Anesthetic complications: no ? ? ?No notable events documented. ? ?Last Vitals:  ?Vitals:  ? 05-25-2021 1130 05-25-21 1435  ?BP: 125/60   ?Pulse: 69   ?Resp: 20 18  ?Temp:    ?SpO2: 100% 99%  ?  ?Last Pain:  ?Vitals:  ? 05/25/2021 1100  ?TempSrc: Axillary  ?PainSc:   ? ? ?  ?  ?  ?  ?  ?  ? ?Ann Bryan ? ? ? ? ?

## 2021-04-30 LAB — CULTURE, RESPIRATORY W GRAM STAIN
Culture: NORMAL
Gram Stain: NONE SEEN

## 2021-05-03 LAB — CULTURE, BLOOD (ROUTINE X 2)
Culture: NO GROWTH
Culture: NO GROWTH

## 2021-05-06 NOTE — Progress Notes (Signed)
? ?  Palliative Medicine Inpatient Follow Up Note ? ? ?Chart reviewed. ? ?I met with patients niece, Tommie Sams at bedside this evening. She shares that she has spoken to family. They are all in agreement that Margretta would never want prolonged measures on life support. We reviewed the scope of comfort focused care.  ? ?We talked about transition to comfort measures in house and what that would entail inclusive of medications to control pain, dyspnea, agitation, nausea, itching, and hiccups.  We discussed stopping all uneccessary measures such as NGT, ventilator, cardiac monitoring, blood draws, needle sticks, and frequent vital signs. Tommie Sams shares that this is what Hugh would have wanted. We talked about starting low dose gtts for pain and anxiety management. Discussed the importance of family and friends being present and celebrating Beths life. Per Caffie Pinto was a very private person and would desire this matter is handled in a similar fashion. ? ?Coordinated with patients RN, Juanda Crumble. ? ?Medical providers informed of status - Dr. Valeta Harms and Dr. Jonnie Finner ? ?Comfort care & compassionate extubation pursued. ? ?SUMMARY OF RECOMMENDATIONS   ?DNAR/DNI ? ?Comfort Care  ? ?Low dose fentanyl - continue precedex ? ?Spiritual care ? ?Unrestricted visitation ? ?Prognosis < 2 weeks ? ?Ongoing PMT support - I will not be present tomorrow though my colleague, Mariana Kaufman will take over ? ?Time Spent: 60 ? ?MDM - High ?______________________________________________________________________________________ ?Tacey Ruiz ?[facility] Team ?Team Cell Phone: [phone] ?Please utilize secure chat with additional questions, if there is no response within 30 minutes please call the above phone number ? ?Palliative Medicine Team providers are available by phone from 7am to 7pm daily and can be reached through the team cell phone.  ?Should this patient require assistance outside of these hours, please call the  patient's attending physician. ? ? ? ? ?

## 2021-05-06 NOTE — Procedures (Signed)
Patient Name: Ann Bryan  ?MRN: [id number]  ?Epilepsy Attending: Lora Havens  ?Referring Physician/Provider: Greta Doom, MD ?Date: May 18, 2021 ?Duration: 25.03 mins ? 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 EEG to evaluate for seizure ? ?Level of alertness:  comatose ? ?AEDs during EEG study: None ? ?Technical aspects: This EEG study was done with scalp electrodes positioned according to the 10-20 International system of electrode placement. Electrical activity was acquired at a sampling rate of 500Hz  and reviewed with a high frequency filter of 70Hz  and a low frequency filter of 1Hz . EEG data were recorded continuously and digitally stored.  ? ?Description: EEG showed continuous generalized 3 to 6 Hz theta-delta slowing admixed with frontocentral 13-15Hz  beta activity. Hyperventilation and photic stimulation were not performed.    ? ?ABNORMALITY ?- Continuous slow, generalized ? ?IMPRESSION: ?This study is suggestive of severe diffuse encephalopathy, nonspecific etiology. No seizures or epileptiform discharges were seen throughout the recording. ? ?Lora Havens  ? ?

## 2021-05-06 NOTE — Progress Notes (Signed)
Patient has been made comfort measures per chart review. ?Neurology sign off for now and will be available as needed-please not hesitate to call with questions. ? ? ?-- ?Amie Portland, MD ?Neurologist ?[facility] ?Pager: [phone] ? ?

## 2021-05-06 NOTE — Death Summary Note (Signed)
?DEATH SUMMARY  ? ?Patient Details  ?Name: Ann Bryan ?MRN: [id number] ?DOB: 06-Oct-1960 ? ?Admission/Discharge Information  ? ?Admit Date:  April 25, 2021  ?Date of Death: Date of Death: 05/07/21  ?Time of Death: Time of Death: 1027  ?Length of Stay: 12  ?Referring Physician: Ladell Pier, MD  ? ?Reason(s) for Hospitalization  ?This is a 61 year old female with past medical history of DM, HTN, HLD, neuropathy, ESRD on PD.  She presented to [facility] ED 2021-04-25 with generalized weakness and syncope. ?  ?In ED, she was found to have NSTEMI.  Her syncopal episode was felt to be due to hypoglycemia after her insulin regimen was adjusted recently before this episode. ?  ?She was admitted by [facility].  She was seen in consultation by cardiology after she was found to have new systolic CHF and possible cardiogenic shock in the setting of NSTEMI.  Right left heart cath showed severe three-vessel CAD for which PCI was not felt to be a great option nor was mechanical support.  It was recommended to manage NSTEMI medically though very little room for GDMT. ?  ?Palliative care was consulted 4/15 due to her multiple comorbidities and limited options.  After discussions, patient requested to be DNR. ?  ?IR consulted 4/16 for tunneled HD cath placement as it was felt that she was not tolerating PD well at home.  4/17, vascular surgery consulted for evaluation of new access either AVF versus AVG for long-term HD access. ?  ?4/19, she was taken to the operating room for new AVF placement.  Either peri or postop, she developed bradycardia and hypotension.  She was subsequently intubated due to concerns for near arrest.  Started on levo/neo for hypotension. No compressions. Unknown if patient lost pulses. PCCM was called in consultation and for assistance in the ICU. ? ?Diagnoses  ?Preliminary cause of death:  ?Secondary Diagnoses (including complications and co-morbidities):  ?Principal Problem: ?  ESRD (end stage renal disease) on dialysis  [facility]) ?Active Problems: ?  Essential hypertension ?  Anemia in chronic kidney disease (CKD) ?  Type 2 diabetes mellitus with proliferative retinopathy, with long-term current use of insulin ([place]) ?  Dyslipidemia ?  NSTEMI (non-ST elevated myocardial infarction) ([place]) ?  Acute systolic CHF (congestive heart failure) ([place]) ?  Coronary artery disease involving native coronary artery of native heart with angina pectoris ([place]) ?  Failure to thrive in adult ?  Supraventricular tachycardia ([place]) ?  Neuropathy of foot ?  Rhabdomyolysis ?  Hypothermia ?  Observation after surgery ?  Acute respiratory failure ([place]) ?  Bradycardia ?  Syncope ?  Shock ([place]) ?  Goals of care, counseling/discussion ?  Malnutrition of moderate degree ? ? ?Brief Hospital Course (including significant findings, care, treatment, and services provided and events leading to death)  ?Ann Bryan is a 61 y.o. year old female who past medical history of DM, HTN, HLD, neuropathy, ESRD on PD.  She presented to [facility] ED 04-25-2021 with generalized weakness and syncope. ?  ?In ED, she was found to have NSTEMI.  Her syncopal episode was felt to be due to hypoglycemia after her insulin regimen was adjusted recently before this episode. ?  ?She was admitted by [facility].  She was seen in consultation by cardiology after she was found to have new systolic CHF and possible cardiogenic shock in the setting of NSTEMI.  Right left heart cath showed severe three-vessel CAD for which PCI was not felt to be a great option nor  was mechanical support.  It was recommended to manage NSTEMI medically though very little room for GDMT. ?  ?Palliative care was consulted 4/15 due to her multiple comorbidities and limited options.  After discussions, patient requested to be DNR. ?  ?IR consulted 4/16 for tunneled HD cath placement as it was felt that she was not tolerating PD well at home.  4/17, vascular surgery consulted for evaluation of new access either AVF versus AVG for long-term  HD access. ?  ?4/19, she was taken to the operating room for new AVF placement.  Either peri or postop, she developed bradycardia and hypotension.  She was subsequently intubated due to concerns for near arrest.  Started on levo/neo for hypotension. No compressions. Unknown if patient lost pulses. PCCM was called in consultation and for assistance in the ICU. ? ?4/11: admitted w/ syncope ?4/19: went to OR to obtain fistula graft revision; during procedure patient developed severe bradycardia 30-40s, became unresponsive, hypotensive started on levo/neo, unknown if lost pulses. Patient intubated. No compressions with patient being DNR. Admitted to ICU ?4/21 OR for fistula revision and new tunneled catheter placement, evening of 4/21 return to the operating room for evacuation of left arm hematoma and removal of peritoneal dialysis catheter ? 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 ?Fever, 102 ?F, Elevated white blood cell count ?Possible sepsis ?NSTEMI ?CAD ?HLD ?SVT ?ESRD: patient was on PD outpatient; AV fistula graft revised 4/19 for HD ?Failure to thrive ?HTN ?Acute on chronic systolic CHF: echo EF 71-06% ?T2DM, with hyperglycemia ?Anemia of CKD ?Neuropathy of foot ? ?Ultimately, the patients family met with palliative care team. They would not want to live in a persistent state and related to significant debility from anoxic brain injury. They made the decision to make her comfortable. The patient was extubated and transitioned to comfort care measures. The patient passed peacefully in the ICU.  ? ? ?Pertinent Labs and Studies  ?Significant Diagnostic Studies ?DG  Abd 1 View ? ?Result Date: 04/10/2021 ?CLINICAL DATA:  Enteric tube placement. EXAM: ABDOMEN - 1 VIEW COMPARISON:  Chest radiograph dated 04/10/2021. FINDINGS: Enteric tube with tip in the distal stomach. There is moderate stool throughout the colon. No bowel dilatation IMPRESSION: Enteric tube with tip in the distal stomach. Electronically Signed   By: Anner Crete M.D.   On: 05/01/2021 22:38  ? ?CT HEAD WO CONTRAST (5MM) ? ?Result Date: 04/25/2021 ?CLINICAL DATA:  Neuro deficit, acute, stroke suspected. Hypotensive, concern for water shed infarcts. RUE and RLE weakness. EXAM: CT HEAD WITHOUT CONTRAST TECHNIQUE: Contiguous axial images were obtained from the base of the skull through the vertex without intravenous contrast. RADIATION DOSE REDUCTION: This exam was performed according to the departmental dose-optimization program which includes automated exposure control, adjustment of the mA and/or kV according to patient size and/or use of iterative reconstruction technique. COMPARISON:  05/01/2021 FINDINGS: Brain: Normal anatomic configuration. Parenchymal volume loss is commensurate with the patient's age. Patchy mild periventricular white matter changes are present likely reflecting the sequela of small vessel ischemia, stable since prior examination. Tiny remote infarct within the right cerebellar hemisphere and left parietal subcortical white matter are unchanged. No abnormal intra or extra-axial mass lesion or fluid  collection. No abnormal mass effect or midline shift. No evidence of acute intracranial hemorrhage or infarct. Ventricular size is normal. Cerebellum unremarkable. Vascular: No asymmetric hyperdense vasculature at the skull base. Skull: Intact Sinuses/Orbits: Paranasal sinuses are clear. Ocular lenses have been removed. Orbits are otherwise unremarkable. Other: Mastoid air cells and middle ear cavities are clear. IMPRESSION: No acute intracranial abnormality.  No calvarial fracture. Stable  senescent changes and remote infarcts. Electronically Signed   By: Fidela Salisbury M.D.   On: 04/25/2021 20:45  ? ?CT Head Wo Contrast ? ?Result Date: 04/13/2021 ?CLINICAL DATA:  Acute neuro deficit rule out

## 2021-05-06 NOTE — Progress Notes (Signed)
?[facility] ?Progress Note  ? ?Subjective:  Labs okay this am.  ? ?Objective ?Vitals:  ? 05-22-21 0600 May 22, 2021 0615 05-22-21 0630 05-22-21 0714  ?BP: (!) 112/55 (!) 113/53 (!) 120/54   ?Pulse:      ?Resp: 18 17 20 18   ?Temp:      ?TempSrc:      ?SpO2:    100%  ?Weight:      ?Height:      ? ?Physical Exam ?Gen on vent, sedated ?Sclera anicteric, throat w/ ETT ?No jvd or bruits ?Chest clear anterior/ lateral ?RRR no RG ?Abd soft ntnd no mass or ascites +bs ?Ext no LE or UE edema ?Neuro is on vent ?  LUE AVF+bruit ? ?Dialysis Orders: ?Prev on [facility] - managed by Dr. Joelyn Oms ?Now transitioning to HD d/t PD failure - establishing orders. ?- 4/17 hep B Ag neg and hep B Abs > 10 (= protective)  ?  ? CXR 4/21 - no edema/ infiltrates ? ?Assessment/Plan: ?Hypotension - during 1st HD access surgery, resolved. Off pressors.  ?VDRF - per CCM ?ESRD: was on CCPD, failing modality, ^ BUN, alb < 1.5 and progressive debility. Pt transitioned over to HD. Access issues as below. Will attempt to use AVF w/ HD tomorrow. Have d/w VVS.  ?Dialysis access: IR placed [facility] 4/17 for transition to HD. VVS revised pt's old AVF 4/19. PD cath removed 4/21. Had AVF thrombectomy and TDC assessment (patent, no kinks) per VVS again on 4/21. Currently [facility] still not functioning well (positional) but w/o kinks or other issue when assessed on 4/21. AVF ok to use.  ?HTN/volume: BP stable at this time, no LE edema. Wt's stable, no ^vol on exam. Under admit wt 2-3kg.  ?MBD ckd: cont rocaltrol. Given severe arterial Ca++ changed phoslo to renvela ?Anemia of ESRD: Hgb > 12, no ESA needed ?Fall/AMS/weakness: admit issues, had resolved ?Severe systolic HF: new dx, felt to be ischemic, echo 4/11 with EF 21% ?Severe 3V CAD/NSTEMI: new dx, LHC 4/14 without targets for intervention ?Nutrition: Alb < 1.5, added supplements ?GOC: poor prognosis overall w/ possible ABI per neuro notes.  ? ?Kelly Splinter, MD ?05/22/21, 7:29 AM ? ?Recent Labs  ?Lab  04/25/21 ?1100 04/25/21 ?1332 04/17/2021 ?1431 04/27/21 ?0816 04/27/21 ?1011 04/27/21 ?1644 05-22-2021 ?0117  ?HGB 12.5  --   --   --  9.2*  --   --   ?ALBUMIN  --  <1.5*  --  <1.5*  --   --   --   ?CALCIUM  --  7.1*  --  7.4*  7.4*  --   --  7.7*  ?PHOS  --  3.9   < > 3.1  3.1  --  4.0  --   ?CREATININE  --  5.36*  --  3.68*  3.66*  --   --  5.49*  ?K  --  3.6  --  3.5  3.4*  --   --  4.5  ? < > = values in this interval not displayed.  ? ? ? ? ? ?Medications: ? sodium chloride    ? dexmedetomidine (PRECEDEX) IV infusion 1 mcg/kg/hr (05-22-21 0640)  ? dextrose Stopped (05/02/2021 2205)  ? feeding supplement (VITAL 1.5 CAL) 45 mL/hr at 05/22/21 0600  ? norepinephrine (LEVOPHED) Adult infusion Stopped (04/13/2021 2145)  ? propofol (DIPRIVAN) infusion Stopped (04/25/21 2725)  ? ? acetaminophen (TYLENOL) oral liquid 160 mg/5 mL  325 mg Per Tube Q6H  ? amiodarone  200 mg Per Tube BID  ?  aspirin  81 mg Per Tube Daily  ? atorvastatin  80 mg Per Tube Daily  ? brimonidine  1 drop Left Eye BID  ? calcitRIOL  1 mcg Per Tube Once per day on Tue Thu Sat  ? colchicine  0.3 mg Per Tube Daily  ? docusate  100 mg Per Tube BID  ? feeding supplement (PROSource TF)  90 mL Per Tube BID  ? heparin  5,000 Units Subcutaneous Q8H  ? insulin aspart  0-6 Units Subcutaneous Q4H  ? loratadine  10 mg Per Tube Daily  ? mouth rinse  15 mL Mouth Rinse 10 times per day  ? multivitamin  1 tablet Per Tube QHS  ? nystatin   Topical TID  ? pantoprazole sodium  40 mg Per Tube Daily  ? polyethylene glycol  17 g Per Tube Daily  ? sevelamer carbonate  2,400 mg Per Tube TID WC  ? ? ? ? ?

## 2021-05-06 NOTE — Progress Notes (Signed)
[facility] [place] ?PROGRESS NOTE ? ?ASSESSMENT / PLAN: ?Ann Bryan is a 61 y.o. female with ESRD dialyzing via RIJ TDC. S/P L cephalic vein turndown 8/98/42, thrombectomy 04/10/2021, and evacuation of hematoma 04/20/2021.  ? ?The fistula has a strong thrill today. The catheter is functioning poorly. We can start using the fistula early next week for HD.  ? ?Globally she has a poor prognosis, and does not seem to be following commands. She remains critically ill and intubated.  ? ?SUBJECTIVE: ?Intubated. No interval change.  ? ?OBJECTIVE: ?BP 127/61   Pulse 75   Temp (!) 102 ?F (38.9 ?C) (Axillary)   Resp (!) 21   Ht 5' (1.524 m)   Wt 61.7 kg   LMP 03/13/2002   SpO2 100%   BMI 26.57 kg/m?  ? ?Intake/Output Summary (Last 24 hours) at May 08, 2021 0942 ?Last data filed at May 08, 2021 0800 ?Gross per 24 hour  ?Intake 1393.35 ml  ?Output 0 ml  ?Net 1393.35 ml  ? ?  ?Constitutional: critically ill. Intubated.  ?CNS: GCS 9T.  ?Cardiac: RRR. ?Vascular: strong thrill in left arm fistula. TDC in place.  ? ? ?  Latest Ref Rng & Units 04/27/2021  ? 10:11 AM 04/25/2021  ? 11:00 AM 05/01/2021  ?  7:45 PM  ?CBC  ?WBC 4.0 - 10.5 K/uL 22.7   18.4     ?Hemoglobin 12.0 - 15.0 g/dL 9.2   12.5   12.2    ?Hematocrit 36.0 - 46.0 % 28.6   39.3   36.0    ?Platelets 150 - 400 K/uL 282   457     ?  ? ? ?  Latest Ref Rng & Units May 08, 2021  ?  7:57 AM 05-08-21  ?  1:17 AM 04/27/2021  ?  8:16 AM  ?CMP  ?Glucose 70 - 99 mg/dL 458   292   247    ? 249    ?BUN 6 - 20 mg/dL 55   46   25    ? 26    ?Creatinine 0.44 - 1.00 mg/dL 5.84   5.49   3.66    ? 3.68    ?Sodium 135 - 145 mmol/L 132   132   135    ? 135    ?Potassium 3.5 - 5.1 mmol/L 5.2   4.5   3.4    ? 3.5    ?Chloride 98 - 111 mmol/L 96   96   98    ? 99    ?CO2 22 - 32 mmol/L 21   23   26     ? 26    ?Calcium 8.9 - 10.3 mg/dL 7.9   7.7   7.4    ? 7.4    ? ? ?Estimated Creatinine Clearance: 8.5 mL/min (A) (by C-G formula based on SCr of 5.84 mg/dL (H)). ? ?[name].  Stanford Breed, MD ?[facility] [place] ?Office Phone Number: [phone] ?05-08-21 9:42 AM ? ? ? ?

## 2021-05-06 NOTE — Progress Notes (Addendum)
Pharmacy Antibiotic Note ? ?Ann Bryan is a 61 y.o. female admitted on 04/17/2021 with sepsis.  Pharmacy has been consulted for vancomycin and  dosing. ? ?Concern for anoxic brain injury, new fever (Tmax 102F) ?WBC 22.7 ?ESRD - previously on CCPD, transitioned to HD with PD failure. Low BFR and poor TDC function at 4/22 HD session, per nephro next session Mon ? ?Plan: ?Give vancomycin 1250 mg IV x1 ?Vancomycin 750 mg IV qHD TTS ?Cefepime 1g IV every 24 hours  ?Monitor HD sessions, CBC, C/S, LOT and de-escalate as able ?F/U palliative care discussions ?F/u HD schedule - had problems with AV fistula, cleared to use by [facility] 4/21 ? ?Temp (24hrs), Avg:99.1 ?F (37.3 ?C), Min:98.2 ?F (36.8 ?C), Max:102 ?F (38.9 ?C) ? ?Recent Labs  ?Lab 04/22/21 ?0118 04/17/2021 ?0142 04/17/2021 ?0128 04/09/2021 ?0630 04/13/2021 ?1814 05/03/2021 ?2152 04/25/21 ?1601 04/25/21 ?0446 04/25/21 ?1100 04/25/21 ?1332 04/27/21 ?0932 04/27/21 ?1011 12-May-2021 ?0117 05-12-2021 ?0757  ?WBC 12.9* 14.9* 17.0*  --   --   --   --   --  18.4*  --   --  22.7*  --   --   ?CREATININE 13.05* 8.98*  --    < > 7.90*  --   --   --   --  5.36* 3.68*  3.66*  --  5.49* 5.84*  ?LATICACIDVEN  --   --   --   --  4.6* 1.8 1.9 1.8  --   --   --   --   --   --   ? < > = values in this interval not displayed.  ?  ?Estimated Creatinine Clearance: 8.5 mL/min (A) (by C-G formula based on SCr of 5.84 mg/dL (H)).   ? ?Allergies  ?Allergen Reactions  ? Doxycycline Nausea And Vomiting  ? Victoza [Liraglutide] Nausea And Vomiting  ? Chlorhexidine Rash  ?  States rash with use of CHG bath  ? ? ?Antimicrobials this admission: ?Preop cefazolin 4/17, 4/19, 4/21 ?Vancomycin 4/23 >> ?Cefepime 4/23 >> ? ?Dose adjustments this admission: ?none ? ?Microbiology results: ?4/23 BCx: sent ?4/23 TA: sent ? ? ?Thank you for allowing pharmacy to be a part of this patient?s care. ? ?Laurey Arrow, PharmD ?PGY1 Pharmacy Resident ?05/12/21  9:51 AM ? ?Please check [email or web address] for unit-specific pharmacy phone  numbers. ? ?

## 2021-05-06 NOTE — Procedures (Signed)
Extubation Procedure Note ? ?Patient Details:   ?Name: Ann Bryan ?DOB: Jul 05, 1960 ?MRN: [id number] ?  ?Airway Documentation:  ?  ?Vent end date: 2021-05-25 Vent end time: 1622  ? ?Evaluation ? O2 sats: stable throughout ?Complications: No apparent complications ?Patient did tolerate procedure well. ?Bilateral Breath Sounds: Clear, Diminished ?  ?No, pt could not speak post extubation.  Pt extubated to room air per physician's order and in accordance with the family's wishes. ? ?Earney Navy ?May 25, 2021, 4:23 PM ? ?

## 2021-05-06 NOTE — Progress Notes (Signed)
? ?NAME:  Ann Bryan, MRN:  [id number], DOB:  12-22-1960, LOS: 12 ?ADMISSION DATE:  04/15/2021, CONSULTATION DATE:  4/19 ?REFERRING MD:  Dr. Trula Slade, CHIEF COMPLAINT:  bradycardia; intubated for   ? ?History of Present Illness:  ?This is a 61 year old female with past medical history of DM, HTN, HLD, neuropathy, ESRD on PD.  She presented to [facility] ED 04/25/2021 with generalized weakness and syncope. ?  ?In ED, she was found to have NSTEMI.  Her syncopal episode was felt to be due to hypoglycemia after her insulin regimen was adjusted recently before this episode. ?  ?She was admitted by [facility].  She was seen in consultation by cardiology after she was found to have new systolic CHF and possible cardiogenic shock in the setting of NSTEMI.  Right left heart cath showed severe three-vessel CAD for which PCI was not felt to be a great option nor was mechanical support.  It was recommended to manage NSTEMI medically though very little room for GDMT. ?  ?Palliative care was consulted 4/15 due to her multiple comorbidities and limited options.  After discussions, patient requested to be DNR. ?  ?IR consulted 4/16 for tunneled HD cath placement as it was felt that she was not tolerating PD well at home.  4/17, vascular surgery consulted for evaluation of new access either AVF versus AVG for long-term HD access. ?  ?4/19, she was taken to the operating room for new AVF placement.  Either peri or postop, she developed bradycardia and hypotension.  She was subsequently intubated due to concerns for near arrest.  Started on levo/neo for hypotension. No compressions. Unknown if patient lost pulses. PCCM was called in consultation and for assistance in the ICU. ? ?Pertinent  Medical History  ? ?Past Medical History:  ?Diagnosis Date  ? Anemia   ? Cataract   ? right eye  ? Complication of anesthesia   ?  " I woke up itching from anesthesia many years ago."   ? Depression   ? at times  ? Diabetes mellitus 1986  ?  poorly controlled last  hemoglobin A1c  10.5 on January 01, 2010, increase since June 2011 when hemoglobin A1c was 9.3 , determined to be most likely secondary to non-compliance.  ? Diabetic nephropathy ([place])   ? Diabetic retinopathy   ? Dyspnea   ? "when I have too much fluid"  ? End stage renal disease on dialysis [facility])   ? T/Th/Sat [address]  ? Glaucoma   ? History of blood transfusion   ? Hyperlipidemia   ? Hypertension   ? Nausea and vomiting   ? Neuropathy   ? Retinopathy   ? Wears glasses   ? ? ? ?Significant Hospital Events: ?Including procedures, antibiotic start and stop dates in addition to other pertinent events   ?4/11: admitted w/ syncope ?4/19: went to OR to obtain fistula graft revision; during procedure patient developed severe bradycardia 30-40s, became unresponsive, hypotensive started on levo/neo, unknown if lost pulses. Patient intubated. No compressions with patient being DNR. Admitted to ICU ?4/21 OR for fistula revision and new tunneled catheter placement, evening of 4/21 return to the operating room for evacuation of left arm hematoma and removal of peritoneal dialysis catheter ? ?Interim History / Subjective:  ? ?Patient's MRI of the brain had concern for anoxic injury and areas of watershed.  Patient sent for CTA of the head and neck which reveals chronic occlusion of the carotid which likely played a role in her  watershed and anoxic injury.  Patient has also had a new fever. ? ?Objective   ?Blood pressure 127/61, pulse 75, temperature (!) 102 ?F (38.9 ?C), temperature source Axillary, resp. rate (!) 21, height 5' (1.524 m), weight 61.7 kg, last menstrual period 03/13/2002, SpO2 100 %. ?   ?Vent Mode: PRVC ?FiO2 (%):  [30 %-40 %] 30 % ?Set Rate:  [18 bmp] 18 bmp ?Vt Set:  [360 mL] 360 mL ?PEEP:  [5 cmH20] 5 cmH20 ?Pressure Support:  [8 cmH20-10 cmH20] 10 cmH20 ?Plateau Pressure:  [13 cmH20-15 cmH20] 15 cmH20  ? ?Intake/Output Summary (Last 24 hours) at 25-May-2021 0940 ?Last data filed at 25-May-2021 0800 ?Gross  per 24 hour  ?Intake 1393.35 ml  ?Output 0 ml  ?Net 1393.35 ml  ? ?Filed Weights  ? 04/27/21 0715 04/27/21 0915 25-May-2021 0500  ?Weight: 61.7 kg 61.7 kg 61.7 kg  ? ? ?Examination: ?General: Female, intubated on mechanical life support critically ill ?HEENT: Endotracheal tube in place, opens eyes to stimulation. ?Neuro: Still no purposeful movements for me today.  She will withdrawal to pain.  Left side moves more than the right. ?CV: Tachycardic, regular, S1-S2 ?PULM: Bilateral mechanically ventilated breath sounds ?GI: Soft, nontender nondistended ?Extremities: No significant edema ?Skin: No rash ? ? ?Resolved Hospital Problem list   ? ? ?Assessment & Plan:  ? 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 ?Case discussed with neurology ? ?Acute respiratory failure due to above ?P: ?Continue low tidal volume ventilation strategy ?Continue to wean PEEP and FiO2 to maintain sats greater than 90% ?VAP bundle and SBT SAT as tolerated ?Unable to consider liberation from the ventilator due to mental status. ? ?Fever, 102 ?F ?Elevated white blood cell count ?Possible sepsis ?Plan: ?Blood cultures, tracheal aspirate ?Empiric Zosyn plus vancomycin. ? ?NSTEMI ?CAD ?HLD ?P: ?Continue aspirin, statin, Coreg ? ?SVT ?P: ?Continue amiodarone ? ?ESRD: patient was on PD outpatient; AV fistula graft revised 4/19 for HD ?P: ?-AV fistula graft revised 4/19, however failed taken back on 05/01/2021 for fistula revision ?New tunneled PermCath on right placed on  04/11/2021. ?-Dialysis per nephrology ? ?Failure to thrive ?P: ?Agree with ongoing palliative care discussions ? ?HTN ?Acute on chronic systolic CHF: echo EF 94-76% ?P: ?Volume removal with CVVHD ? ?T2DM, with hyperglycemia ?P: ?CBGs with SSI ?Restart some long-acting insulin ? ?Anemia of CKD ?P: ?Folate, iron, B12 ?Follow CBCs for any acute signs of bleeding ? ?Neuropathy of foot ?P: ?Holding PTA gabapentin in the setting of encephalopathy ? ? ?Best Practice (right click and "Reselect all SmartList Selections" daily)  ? ?Diet/type: NPO w/ meds via tube ?DVT prophylaxis: prophylactic heparin  ?GI prophylaxis: PPI ?Lines: N/A ?Foley:  N/A ?Code Status:  full code ?Last date of multidisciplinary goals of care discussion  ? ?4/22 spoke with niece via phone  ? ?Labs   ?CBC: ?Recent Labs  ?Lab 04/22/21 ?0118 04/09/2021 ?0142 04/15/2021 ?0128 05/04/2021 ?1945 04/25/21 ?1100 04/27/21 ?1011  ?WBC 12.9* 14.9* 17.0*  --  18.4* 22.7*  ?HGB 12.0 12.4 11.4* 12.2 12.5 9.2*  ?HCT 37.0 39.0 36.5 36.0 39.3 28.6*  ?  MCV 86.9 88.0 88.2  --  89.1 88.0  ?PLT 421* 413* 401*  --  457* 282  ? ? ?Basic Metabolic Panel: ?Recent Labs  ?Lab 04/17/2021 ?1814 04/08/2021 ?1945 04/25/21 ?1332 04/30/2021 ?1431 04/12/2021 ?1758 04/27/21 ?0762 04/27/21 ?1644 2021/05/03 ?0117 05/03/2021 ?2633  ?NA 134* 130* 135  --   --  135  135  --  132* 132*  ?K 5.0 4.9 3.6  --   --  3.5  3.4*  --  4.5 5.2*  ?CL 101  --  98  --   --  99  98  --  96* 96*  ?CO2 15*  --  26  --   --  26  26  --  23 21*  ?GLUCOSE 163*  --  85  --   --  249*  247*  --  292* 458*  ?BUN 36*  --  26*  --   --  26*  25*  --  46* 55*  ?CREATININE 7.90*  --  5.36*  --   --  3.68*  3.66*  --  5.49* 5.84*  ?CALCIUM 8.3*  --  7.1*  --   --  7.4*  7.4*  --  7.7* 7.9*  ?MG 2.0  --   --  1.8 1.8 1.7 1.7  --   --   ?PHOS  --   --  3.9 6.3* 6.6* 3.1  3.1 4.0  --   --   ? ?GFR: ?Estimated Creatinine Clearance: 8.5 mL/min (A) (by C-G formula based on SCr of 5.84 mg/dL (H)). ?Recent Labs  ?Lab 04/22/2021 ?0142  05/01/2021 ?0128 05/05/2021 ?1814 05/03/2021 ?2152 04/25/21 ?3545 04/25/21 ?0446 04/25/21 ?1100 04/27/21 ?1011  ?WBC 14.9* 17.0*  --   --   --   --  18.4* 22.7*  ?LATICACIDVEN  --   --  4.6* 1.8 1.9 1.8  --   --   ? ? ?Live

## 2021-05-06 NOTE — Progress Notes (Signed)
Patient alert and moving left arm in a waving motion. Ventilator alarming and patient biting on ET tube. Asked patient if she wanted to be suctioned but she refused by closing clinching jaw tightly, frowning, moving head slightly, and waving left arm side to side. Asked patient if she wanted to sit up higher in bed and she raised her eyebrows. HOB elevated from 30 degrees to 45 degrees. Patient immediately relaxed and closed her eyes. Pain assessment 7/10. Fentanyl 50 mcg IV injection given.  ? ? May 26, 2021 0329  ?Vitals  ?BP (!) 156/63  ?MAP (mmHg) 91  ?Pulse Rate 84  ?ECG Heart Rate 84  ?Resp (!) 23  ?Level of Consciousness  ?Level of Consciousness Alert  ?Oxygen Therapy  ?SpO2 100 %  ?Pre-WUA / WUA Start  ?[place] Agitation Sedation Scale (RASS) 2  ?Critical Care Pain Observation Tool (CPOT)  ?Facial Expression 2  ?Body Movements 2  ?Muscle Tension 1  ?Compliance with ventilator (intubated pts.) 2  ?Vocalization (extubated pts.) N/A  ?CPOT Total 7  ?Glasgow Coma Scale  ?Eye Opening 4  ?Modified Verbal Response (INTUBATED) 3  ?Best Motor Response 5  ?Glasgow Coma Scale Score 12  ? ? ?

## 2021-05-06 NOTE — Progress Notes (Signed)
? ?  Palliative Medicine Inpatient Follow Up Note ? ?HPI: ?61 year old with a history of DM2, HTN, HLD, diabetic neuropathy, and ESRD on PD who presented to the ED with severe generalized weakness for approximately 6 to 7 days and an eventual pre-syncopal spell resulting in a fall without loss of consciousness.  After her fall she was too weak to get up and spent the night on the floor, thusly missing her PD.  A neighbor called EMS who arrived to find the patient on the floor with stable vital signs and a normal CBG.  In the ED the patient was hypothermic with temperature 95.4.  WBC was borderline at 10. ?  ?Palliative care has been asked to get involved to discuss goals of care in the setting of multiple co-morbidities.  ? ?Today's Discussion (May 07, 2021): ? ?*Please note that this is a verbal dictation therefore any spelling or grammatical errors are due to the "[place] One" system interpretation. ? ?Chart reviewed inclusive of vital signs, progress notes, laboratory results, and diagnostic images.  ? ?I met with Ann Bryan and her RN at bedside this morning. Reviewed that her sedation had been increased d/t patient trying to self dislodge ETT. She had been responsive to deep stimuli though otherwise is not following commands.  ? ?I called patients niece, Ann Bryan this morning. We reviewed the neurology notes over the telephone. I shared that per today's assessment she "may need prolonged ventilatory and nutritional support to survive". Ann Bryan was not surprised by this information and had been concerned that this was likely the direction things were going. She shares that she plans to be here this afternoon for additional conversations. She would also like to speak with other family members regarding next steps. She is clear about not wanting [name] to endure long term support.  ? ?Questions and concerns addressed  ? ?Palliative Support Provided ? ?Objective Assessment: ?Vital Signs ?Vitals:  ? 05/07/21 1115 05-07-21  1130  ?BP: 123/60 125/60  ?Pulse: 72 69  ?Resp: 19 20  ?Temp:    ?SpO2: 100% 100%  ? ? ?Intake/Output Summary (Last 24 hours) at 05-07-21 1205 ?Last data filed at 05/07/21 0800 ?Gross per 24 hour  ?Intake 1189.83 ml  ?Output 0 ml  ?Net 1189.83 ml  ? ? ?Last Weight  Most recent update: 05-07-21  5:01 AM  ? ? Weight  ?61.7 kg (136 lb 0.4 oz)  ?      ? ?  ? ?Gen:  Older AA F chronically ill in appearance ?HEENT: ETT, coretrack, dry mucous membranes ?CV: regular rate and irregular rhythm ?PULM: On ventilator ?ABD: soft/nontender ?EXT: No edema  ?Neuro: Disoriented ? ?SUMMARY OF RECOMMENDATIONS   ?DNAR/DNI ? ?Ongoing [place] conversations with patients niece. New concern for the need for long term management w/ vent and artificial nutritional support in terms of Ann Bryan's hypoxemic injury. Per niece she would not want this. Ann Bryan wants to speak to all family members prior to additional decisions. She plans to be present this afternoon for further conversations. ? ?Ongoing PMT support ? ?MDM - High ?______________________________________________________________________________________ ?Ann Bryan ?[place] Team ?Team Cell Phone: [phone] ?Please utilize secure chat with additional questions, if there is no response within 30 minutes please call the above phone number ? ?Palliative Medicine Team providers are available by phone from 7am to 7pm daily and can be reached through the team cell phone.  ?Should this patient require assistance outside of these hours, please call the patient's attending physician. ? ? ? ? ?

## 2021-05-06 NOTE — Anesthesia Postprocedure Evaluation (Signed)
Anesthesia Post Note ? ?Patient: Ann Bryan ? ?Procedure(s) Performed: EVACUATION OF LEFT SHOULDER HEMATOMA (Left: Arm Upper) ?CONTINUOUS AMBULATORY PERITONEAL DIALYSIS (CAPD) CATHETER REMOVAL (Right: Abdomen) ? ?  ? ?Patient location during evaluation: SICU ?Anesthesia Type: General ?Level of consciousness: sedated ?Pain management: pain level controlled ?Vital Signs Assessment: post-procedure vital signs reviewed and stable ?Respiratory status: patient remains intubated per anesthesia plan ?Cardiovascular status: stable ?Postop Assessment: no apparent nausea or vomiting ?Anesthetic complications: no ? ? ?No notable events documented. ? ?Last Vitals:  ?Vitals:  ? 2021-05-04 1115 05-04-2021 1130  ?BP: 123/60 125/60  ?Pulse: 72 69  ?Resp: 19 20  ?Temp:    ?SpO2: 100% 100%  ?  ?Last Pain:  ?Vitals:  ? 04-May-2021 1100  ?TempSrc: Axillary  ?PainSc:   ? ? ?  ?  ?  ?  ?  ?  ? ?Ann Bryan ? ? ? ? ?

## 2021-05-06 NOTE — Progress Notes (Signed)
?  2021/05/22 1552  ?Clinical Encounter Type  ?Visited With Family;Health care provider  ?Visit Type Patient actively dying;Critical Care;Spiritual support;Initial  ?Referral From Nurse ?(Carloyn Manner)  ?Consult/Referral To Chaplain ?Albertina Parr Talmage)  ?Spiritual Encounters  ?Spiritual Needs Emotional;Grief support;Prayer;Sacred text  ? ?Paged by patient's nurse to be with [name]'s family as they transition her to comfort care. Met patient's niece Ann Bryan, along with two other nieces at Ms. Hoelzer's bedside. Chaplain provided meaningful presence and invited family to share memorable stories about Ann Bryan. We were all present as Ann Bryan was removed from the ventilator extubated.  ? ?Ann Sams shared stories about being raised by Ann Bryan and Ann Bryan. Ann Sams shared that entire family are Born Again Christians and that Ann Bryan claimed "Jesus Christ as her Ann Bryan." At families request I provided anointing of the dying, and we shared in the recitation of the 23rd Psalm. The family and I gathered at Ann Bryan side and each of Ann Bryan shared in prayer for Ann Bryan salvation and for comfort and support this family as they grieve her death. Returned to meet with Ann Bryan's family with the addition her nephew and his wife.  ?[address] [place], Ann Bryan., [phone]. ?

## 2021-05-06 NOTE — Progress Notes (Signed)
EEG complete - results pending 

## 2021-05-06 NOTE — Progress Notes (Signed)
Neurology Progress Note ? ? ?S:// ?Seen and examined ?EEG negative for seizures ? ? ?O:// ?Current vital signs: ?BP 127/61   Pulse 75   Temp (!) 102 ?F (38.9 ?C) (Axillary)   Resp (!) 21   Ht 5' (1.524 m)   Wt 61.7 kg   LMP 03/13/2002   SpO2 100%   BMI 26.57 kg/m?  ?Vital signs in last 24 hours: ?Temp:  [98.2 ?F (36.8 ?C)-102 ?F (38.9 ?C)] 102 ?F (38.9 ?C) (04/23 2248) ?Pulse Rate:  [66-98] 75 (04/23 0900) ?Resp:  [0-23] 21 (04/23 0900) ?BP: (107-158)/(50-75) 127/61 (04/23 0900) ?SpO2:  [84 %-100 %] 100 % (04/23 0900) ?FiO2 (%):  [30 %-40 %] 30 % (04/23 0800) ?Weight:  [61.7 kg] 61.7 kg (04/23 0500) ?General: Sedated intubated.  Sedation held for exam ?Neurological exam ?To noxious stimulation, attempts to open eyes. ?Nonverbal ?Does not follow commands ?Cranial nerves: Left pupil is somewhat irregular and nonreactive with possible postsurgical features, right pupil is 3 mm reactive sluggishly, corneals intact, oculocephalics intact.,  Facial symmetry difficult to ascertain ?Motor exam: No movement to the stimulation on the right upper or lower extremity.  Some withdrawal on the left upper and lower extremity to noxious simulation ?Sensory exam: Grimacing to noxious stimulation on the left but not on the right. ?Unable to check gait or coordination due to her current mentation ? ? ?Medications ? ?Current Facility-Administered Medications:  ?  0.9 %  sodium chloride infusion, 250 mL, Intravenous, Continuous, Baglia, Corrina, PA-C, New Bag at 04/30/2021 0751 ?  acetaminophen (TYLENOL) 160 MG/5ML solution 325 mg, 325 mg, Per Tube, Q6H, Baglia, Corrina, PA-C, 325 mg at May 05, 2021 0525 ?  acetaminophen (TYLENOL) 160 MG/5ML solution 650 mg, 650 mg, Per Tube, Q4H PRN, Baglia, Corrina, PA-C, 650 mg at 04/27/21 2340 ?  amiodarone (PACERONE) tablet 200 mg, 200 mg, Per Tube, BID, Baglia, Corrina, PA-C, 200 mg at 04/27/21 2224 ?  aspirin chewable tablet 81 mg, 81 mg, Per Tube, Daily, Baglia, Corrina, PA-C, 81 mg at 04/27/21  0920 ?  atorvastatin (LIPITOR) tablet 80 mg, 80 mg, Per Tube, Daily, Baglia, Corrina, PA-C, 80 mg at 04/27/21 0920 ?  brimonidine (ALPHAGAN) 0.2 % ophthalmic solution 1 drop, 1 drop, Left Eye, BID, Baglia, Corrina, PA-C, 1 drop at 04/27/21 2224 ?  calcitRIOL (ROCALTROL) 1 MCG/ML solution 1 mcg, 1 mcg, Per Tube, Once per day on Tue Thu Sat, Ueland, Emma M, RPH, 1 mcg at 04/27/21 1400 ?  colchicine tablet 0.3 mg, 0.3 mg, Per Tube, Daily, Baglia, Corrina, PA-C, 0.3 mg at 04/27/21 0920 ?  dexmedetomidine (PRECEDEX) 400 MCG/100ML (4 mcg/mL) infusion, 0.4-1.2 mcg/kg/hr, Intravenous, Titrated, Icard, Bradley L, DO, Stopped at 05/05/21 0748 ?  dextrose 10 % infusion, , Intravenous, Continuous, Icard, Bradley L, DO, Stopped at 04/30/2021 2205 ?  docusate (COLACE) 50 MG/5ML liquid 100 mg, 100 mg, Per Tube, BID, Baglia, Corrina, PA-C, 100 mg at 04/27/21 2223 ?  feeding supplement (PROSource TF) liquid 90 mL, 90 mL, Per Tube, BID, Icard, Bradley L, DO, 90 mL at 04/27/21 2223 ?  feeding supplement (VITAL 1.5 CAL) liquid 1,000 mL, 1,000 mL, Per Tube, Continuous, Icard, Bradley L, DO, Last Rate: 45 mL/hr at 05/05/2021 0600, Infusion Verify at 05/05/21 0600 ?  fentaNYL (SUBLIMAZE) injection 50-200 mcg, 50-200 mcg, Intravenous, Q30 min PRN, Baglia, Corrina, PA-C, 100 mcg at 2021-05-05 0526 ?  heparin injection 1,500 Units, 1,500 Units, Dialysis, PRN, Baglia, Corrina, PA-C ?  heparin injection 1,500 Units, 1,500 Units, Dialysis, PRN, Baglia, Corrina, PA-C,  1,500 Units at 04/27/21 0655 ?  heparin injection 2,000 Units, 2,000 Units, Dialysis, PRN, Baglia, Corrina, PA-C ?  heparin injection 5,000 Units, 5,000 Units, Subcutaneous, Q8H, Baglia, Corrina, PA-C, 5,000 Units at 2021-05-25 0525 ?  insulin aspart (novoLOG) injection 0-6 Units, 0-6 Units, Subcutaneous, Q4H, Baglia, Corrina, PA-C, 6 Units at 05-25-21 0744 ?  loratadine (CLARITIN) tablet 10 mg, 10 mg, Per Tube, Daily, Baglia, Corrina, PA-C, 10 mg at 04/27/21 0920 ?  MEDLINE mouth rinse,  15 mL, Mouth Rinse, 10 times per day, Baglia, Corrina, PA-C, 15 mL at 2021/05/25 4742 ?  multivitamin (RENA-VIT) tablet 1 tablet, 1 tablet, Per Tube, QHS, Baglia, Corrina, PA-C, 1 tablet at 04/27/21 2224 ?  norepinephrine (LEVOPHED) 4mg  in 214mL (0.016 mg/mL) premix infusion, 2-10 mcg/min, Intravenous, Titrated, Baglia, Corrina, PA-C, Stopped at 04/06/2021 2145 ?  nystatin (MYCOSTATIN/NYSTOP) topical powder, , Topical, TID, Baglia, Corrina, PA-C, Given at 04/27/21 2225 ?  ondansetron (ZOFRAN) injection 4 mg, 4 mg, Intravenous, Q6H PRN, Baglia, Corrina, PA-C, 4 mg at 04/17/21 1527 ?  pantoprazole sodium (PROTONIX) 40 mg/20 mL oral suspension 40 mg, 40 mg, Per Tube, Daily, Baglia, Corrina, PA-C, 40 mg at 04/27/21 0920 ?  phenol (CHLORASEPTIC) mouth spray 1 spray, 1 spray, Mouth/Throat, PRN, Baglia, Corrina, PA-C ?  polyethylene glycol (MIRALAX / GLYCOLAX) packet 17 g, 17 g, Per Tube, Daily, Baglia, Corrina, PA-C, 17 g at 04/27/21 0921 ?  povidone-iodine (BETADINE) 10 % external solution, , Topical, PRN, Baglia, Corrina, PA-C ?  propofol (DIPRIVAN) 1000 MG/100ML infusion, 0-50 mcg/kg/min, Intravenous, Continuous, Baglia, Corrina, PA-C, Stopped at 04/25/21 5956 ?  sevelamer carbonate (RENVELA) tablet 2,400 mg, 2,400 mg, Per Tube, TID WC, Baglia, Corrina, PA-C, 2,400 mg at May 25, 2021 0758 ?  sevelamer carbonate (RENVELA) tablet 800 mg, 800 mg, Per Tube, BID PRN, Baglia, Corrina, PA-C ?Labs ?CBC ?   ?Component Value Date/Time  ? WBC 22.7 (H) 04/27/2021 1011  ? RBC 3.25 (L) 04/27/2021 1011  ? HGB 9.2 (L) 04/27/2021 1011  ? HGB 12.1 01/16/2021 1018  ? HCT 28.6 (L) 04/27/2021 1011  ? HCT 38.1 01/16/2021 1018  ? PLT 282 04/27/2021 1011  ? PLT 303 01/16/2021 1018  ? MCV 88.0 04/27/2021 1011  ? MCV 86 01/16/2021 1018  ? MCH 28.3 04/27/2021 1011  ? MCHC 32.2 04/27/2021 1011  ? RDW 13.8 04/27/2021 1011  ? RDW 12.9 01/16/2021 1018  ? LYMPHSABS 1.2 04/21/2021 1209  ? LYMPHSABS 1.9 01/16/2021 1018  ? MONOABS 0.5 04/14/2021 1209  ? EOSABS  0.1 04/17/2021 1209  ? EOSABS 0.3 01/16/2021 1018  ? BASOSABS 0.0 04/09/2021 1209  ? BASOSABS 0.0 01/16/2021 1018  ? ? ?CMP  ?   ?Component Value Date/Time  ? NA 132 (L) 05-25-21 0757  ? NA 143 11/02/2017 1651  ? K 5.2 (H) 05-25-21 0757  ? CL 96 (L) May 25, 2021 0757  ? CO2 21 (L) 05/25/2021 0757  ? GLUCOSE 458 (H) 05-25-2021 0757  ? BUN 55 (H) 2021/05/25 0757  ? BUN 64 (H) 11/02/2017 1651  ? CREATININE 5.84 (H) 05/25/2021 0757  ? CREATININE 1.69 (H) 12/10/2015 1145  ? CALCIUM 7.9 (L) 25-May-2021 0757  ? PROT 4.3 (L) 04/19/2021 0057  ? PROT 5.8 (L) 11/18/2018 1449  ? ALBUMIN <1.5 (L) 04/27/2021 0816  ? ALBUMIN 3.7 (L) 11/18/2018 1449  ? AST 13 (L) 04/19/2021 0057  ? ALT 15 04/19/2021 0057  ? ALKPHOS 170 (H) 04/19/2021 0057  ? BILITOT 0.6 04/19/2021 0057  ? BILITOT 0.3 11/18/2018 1449  ? GFRNONAA 8 (  L) 26-May-2021 0757  ? GFRNONAA 34 (L) 12/10/2015 1145  ? GFRAA 5 (L) 05/20/2019 1816  ? GFRAA 39 (L) 12/10/2015 1145  ?EEG-no seizures.  Generalized slowing. ? ?Imaging ?I have reviewed images in epic and the results pertinent to this consultation are: ?MRI brain reviewed.  Areas of restricted diffusion in bilateral cortices in a watershed distribution on the right and ACA distribution on the left. ? 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  She has intact brainstem reflexes.  Extensive cortical involvement will likely make for a disabling brain injury. ?CT angiography and CT perfusion study did not show any perfusion deficits suggesting that the carotid stenoses are chronic. ?My partner discussion with the niece who was not sure if her heart would want any type of feeding tube or long-term breathing tube.  We will defer GOC/palliative conversations to the primary team ?EEG was negative for seizures, revealed generalized slowing. ? 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 ?Recommendations: ?Avoid hypotension ?Continue palliative  discussions ?She is not brain dead but her hypoxic ischemic injury to the brain, due to extensive cortical involvement, would be fairly disabling.  She may need prolonged ventilatory and nutritional support t

## 2021-05-06 DEATH — deceased

## 2021-05-09 ENCOUNTER — Inpatient Hospital Stay: Payer: Medicare Other | Admitting: Physician Assistant

## 2022-07-15 ENCOUNTER — Other Ambulatory Visit: Payer: Self-pay | Admitting: Internal Medicine

## 2022-07-15 DIAGNOSIS — E1165 Type 2 diabetes mellitus with hyperglycemia: Secondary | ICD-10-CM
